# Patient Record
Sex: Male | Born: 1955
Health system: Southern US, Community
[De-identification: ages and names within clinical notes are randomized; demographics above are authoritative.]

## PROBLEM LIST (undated history)

## (undated) DIAGNOSIS — R9431 Abnormal electrocardiogram [ECG] [EKG]: Secondary | ICD-10-CM

## (undated) DIAGNOSIS — I10 Essential (primary) hypertension: Secondary | ICD-10-CM

## (undated) DIAGNOSIS — Z720 Tobacco use: Secondary | ICD-10-CM

## (undated) DIAGNOSIS — E876 Hypokalemia: Secondary | ICD-10-CM

## (undated) DIAGNOSIS — I422 Other hypertrophic cardiomyopathy: Secondary | ICD-10-CM

## (undated) DIAGNOSIS — N400 Enlarged prostate without lower urinary tract symptoms: Secondary | ICD-10-CM

## (undated) HISTORY — DX: Hypokalemia: E87.6

## (undated) HISTORY — PX: PROSTATE ABLATION: SHX6042

## (undated) HISTORY — DX: Other hypertrophic cardiomyopathy: I42.2

## (undated) HISTORY — DX: Abnormal electrocardiogram (ECG) (EKG): R94.31

## (undated) HISTORY — PX: CARDIAC CATHETERIZATION: SHX172

## (undated) HISTORY — DX: Tobacco use: Z72.0

## (undated) HISTORY — DX: Essential (primary) hypertension: I10

---

## 2002-06-27 ENCOUNTER — Ambulatory Visit (HOSPITAL_COMMUNITY): Admission: RE | Admit: 2002-06-27 | Discharge: 2002-06-27 | Payer: Self-pay | Admitting: Family Medicine

## 2002-06-27 ENCOUNTER — Encounter: Payer: Self-pay | Admitting: Family Medicine

## 2002-06-28 ENCOUNTER — Encounter: Payer: Self-pay | Admitting: Family Medicine

## 2002-07-18 ENCOUNTER — Encounter: Payer: Self-pay | Admitting: Family Medicine

## 2002-07-18 ENCOUNTER — Ambulatory Visit (HOSPITAL_COMMUNITY): Admission: RE | Admit: 2002-07-18 | Discharge: 2002-07-18 | Payer: Self-pay | Admitting: Family Medicine

## 2003-10-16 ENCOUNTER — Ambulatory Visit (HOSPITAL_COMMUNITY): Admission: RE | Admit: 2003-10-16 | Discharge: 2003-10-16 | Payer: Self-pay | Admitting: Family Medicine

## 2003-11-27 ENCOUNTER — Ambulatory Visit (HOSPITAL_COMMUNITY): Admission: RE | Admit: 2003-11-27 | Discharge: 2003-11-27 | Payer: Self-pay | Admitting: General Surgery

## 2005-08-26 ENCOUNTER — Ambulatory Visit (HOSPITAL_COMMUNITY): Admission: RE | Admit: 2005-08-26 | Discharge: 2005-08-26 | Payer: Self-pay | Admitting: Family Medicine

## 2006-10-11 ENCOUNTER — Ambulatory Visit: Payer: Self-pay | Admitting: Cardiology

## 2007-06-13 ENCOUNTER — Ambulatory Visit: Payer: Self-pay | Admitting: Cardiology

## 2007-06-21 ENCOUNTER — Ambulatory Visit: Payer: Self-pay | Admitting: Cardiology

## 2007-06-21 ENCOUNTER — Inpatient Hospital Stay (HOSPITAL_BASED_OUTPATIENT_CLINIC_OR_DEPARTMENT_OTHER): Admission: RE | Admit: 2007-06-21 | Discharge: 2007-06-21 | Payer: Self-pay | Admitting: Cardiology

## 2008-09-24 ENCOUNTER — Ambulatory Visit: Payer: Self-pay | Admitting: Cardiology

## 2008-10-17 ENCOUNTER — Ambulatory Visit: Payer: Self-pay

## 2008-10-17 ENCOUNTER — Encounter: Payer: Self-pay | Admitting: Cardiology

## 2008-12-12 DIAGNOSIS — I10 Essential (primary) hypertension: Secondary | ICD-10-CM | POA: Insufficient documentation

## 2008-12-12 DIAGNOSIS — R079 Chest pain, unspecified: Secondary | ICD-10-CM | POA: Insufficient documentation

## 2008-12-12 DIAGNOSIS — R9431 Abnormal electrocardiogram [ECG] [EKG]: Secondary | ICD-10-CM | POA: Insufficient documentation

## 2009-04-20 ENCOUNTER — Telehealth: Payer: Self-pay | Admitting: Cardiology

## 2009-06-28 ENCOUNTER — Emergency Department (HOSPITAL_COMMUNITY): Admission: EM | Admit: 2009-06-28 | Discharge: 2009-06-28 | Payer: Self-pay | Admitting: Emergency Medicine

## 2009-06-30 ENCOUNTER — Encounter (INDEPENDENT_AMBULATORY_CARE_PROVIDER_SITE_OTHER): Payer: Self-pay | Admitting: *Deleted

## 2011-02-05 LAB — PROTIME-INR
INR: 1 (ref 0.00–1.49)
Prothrombin Time: 13.3 seconds (ref 11.6–15.2)

## 2011-03-15 NOTE — Cardiovascular Report (Signed)
NAMEBROXTON, BROADY                  ACCOUNT NO.:  [id number]   MEDICAL RECORD NO.:  [id number]          PATIENT TYPE:  OIB   LOCATION:  1961                         FACILITY:  [facility]   PHYSICIAN:  Rollene Rotunda, MD, FACCDATE OF BIRTH:  1956-07-23   DATE OF PROCEDURE:  06/21/2007  DATE OF DISCHARGE:  06/21/2007                            CARDIAC CATHETERIZATION   PROCEDURE:  Cardiac catheterization.   INDICATIONS:  Evaluate patient with chest discomfort, an abnormal EKG  suggesting old anteroseptal infarct with possible ischemia and a  previous Cardiolite suggesting an old anterior infarct.   PROCEDURE NOTE:  Left heart catheterization is performed via the right  femoral artery. The artery was cannulated using anterior wall puncture.  A #4-French arterial sheath was inserted via the modified Seldinger  technique.  A preformed Judkins pigtail was catheter utilized.  The  patient tolerated the procedure well and left the lab in stable  condition.   RESULTS:  HEMODYNAMICS:  LV 101/8, AO 118/74.   CORONARIES:  The left main was normal.  The LAD was normal.  It was a  large vessel wrapping the apex.  The first diagonal was moderate size  and normal.  The second diagonal was small and normal.  The circumflex  in the AV groove was normal.  There was a ramus intermediate which was  moderate and normal.  There was a mid obtuse marginal which was large  and normal.  There was a posterolateral which was small and normal.  The  right coronary artery is the dominant vessel.  It was large and normal.  The PDA was moderate size normal.   LEFT VENTRICULOGRAM:  The left ventriculogram demonstrated an EF of 65%.  However, there was some apical akinesis.  There is a question of whether  this was apical hypertrophy early versus small apical akinetic  area/aneurysm related to previous infarct.   CONCLUSION:  Normal course with well preserved total ejection fraction  but apical wall motion  abnormality is described.   PLAN:  The patient will continue to have medical management for  nonanginal chest pain.  I will follow up with an echocardiogram in the  months to come.      Rollene Rotunda, MD, [facility]  Electronically Signed     JH/MEDQ  D:  06/21/2007  T:  06/22/2007  Job:  [id number]   cc:   Alfredia Client, MD

## 2011-03-15 NOTE — Assessment & Plan Note (Signed)
[facility]                            CARDIOLOGY OFFICE NOTE   NAME:[name] Foster, [name] Foster                           MRN:          [id number]  DATE:06/13/2007                            DOB:          04/12/1956    PRIMARY CARE PHYSICIAN:  Dr. Molly Maduro Day.   REASON FOR PRESENTATION:  Evaluate patient with chest discomfort and an  abnormal stress perfusion study.   HISTORY OF PRESENT ILLNESS:  The patient is now 55 years old.  I saw him  in December.  He was referred for an abnormal EKG.  I was able to review  a 2003 stress perfusion study which had demonstrated an anterior septal  defect consistent with a scar.  The patient was not aware of this.  It  was mostly fixed by the description.  At that time he was having some  shortness of breath.  I suggested cardiac catheterization, but he did  not want to do this, wanted to think about it.  Since that time he has  had, several weeks ago, an episode of substernal chest discomfort that  he thought might be indigestion.  However, it was accompanied by  shortness of breath and lightheadedness.  It lasted for a few hours.  It  went away spontaneously.  He had not been having this before.  He did  not have radiation to his jaw or to his arms.  He was concerned about  it, and now presents for further evaluation.  He is still getting  dyspneic with exertion.  He is not having any resting shortness of  breath, denies any PND or orthopnea.  He has had no recent chest  discomfort.  He, unfortunately, is still smoking cigarettes, though he  apparently has been taking Chantix.   PAST MEDICAL HISTORY:  1. Tobacco abuse.  2. Borderline hypertension.   PAST SURGICAL HISTORY:  None.   ALLERGIES:  None.   MEDICATIONS:  1. Toprol 25 mg b.i.d.  2. Aspirin 81 mg daily.  3. Apparently Chantix.   SOCIAL HISTORY:  The patient is a Government social research officer.  He is divorced.  He  has been smoking one pack a day for 28 years.   FAMILY  HISTORY:  Noncontributory for early coronary disease.   REVIEW OF SYSTEMS:  Negative for other systems.   PHYSICAL EXAMINATION:  The patient is in no distress.  Blood pressure 120/88, heart rate 69 and regular, weight 182 pounds,  body mass index 25.  HEENT:  Eyelids unremarkable, pupils are equal, round, and reactive to  light, fundi not visualized.  Oral mucosa unremarkable.  NECK:  No jugular venous distension at 45 degrees, carotid upstroke  brisk and symmetrical.  No bruit.  No thyromegaly.  LYMPHATICS:  No cervical, axillary, inguinal adenopathy.  LUNGS:  Clear to auscultation bilaterally.  BACK:  No costovertebral angle tenderness.  CHEST:  Unremarkable.  HEART:  PMI not displaced or sustained.  S1 and S2 are within normal  limits.  No S3, no S4.  No clicks, no rubs, no murmurs.  ABDOMEN:  Mildly obese, positive  bowel sounds, normal in frequency and  pitch.  No bruits, no rebound, no guarding.  No midline pulsatile mass.  No hepatomegaly, no splenomegaly.  SKIN:  No rashes, no nodules.  EXTREMITIES:  2+ pulses throughout, no edema.  No cyanosis, no clubbing.  NEURO:  Oriented to person, place, and time.  Cranial nerves II-XII  grossly intact, motor grossly intact.   EKG sinus rhythm, old anterior septal infarct, left axis deviation,  anterolateral T-wave inversions, unchanged from previous, inferior T-  wave inversions, unchanged from previous.   ASSESSMENT AND PLAN:  1. Chest:  The patient has some chest discomfort and has some ongoing      dyspnea with apparent previous anterior infarct on stress perfusion      study.  Given this, the next step should be a cardiac      catheterization.  He will continue with his aspirin an his beta      blocker.  He is instructed not to smoke cigarettes.  He is      instructed to come to the emergency room if he has any recurrent      chest discomfort or any acute shortness of breath.  Otherwise, will      plan an elective cardiac  catheterization.  2. Tobacco:  As above.  He was counseled on the need to stop smoking.  3. Hypertension:  This is well controlled.  4. Dyslipidemia:  He had a recent lipid profile with an LDL of 70.      His HDL was mildly low.  I will consider treating this with a      statin based on the catheterization.  5. Followup will be at the time of the catheterization.     Rollene Rotunda, MD, [facility]  Electronically Signed    JH/MedQ  DD: 06/13/2007  DT: 06/14/2007  Job #: [id number]   cc:   Alfredia Client, MD

## 2011-03-15 NOTE — Assessment & Plan Note (Signed)
[facility]                            CARDIOLOGY OFFICE NOTE   NAME:Foster, [name]                           MRN:          [id number]  DATE:09/24/2008                            DOB:          05-23-1956    PRIMARY CARE PHYSICIAN:  [name] Foster.   REASON FOR PRESENTATION:  Evaluate the patient with apical hypertrophy.   HISTORY OF PRESENT ILLNESS:  The patient is a pleasant 54 year old  gentleman, who had chest discomfort and an abnormal EKG as described.  I  saw him last year and performed a cardiac catheterization.  This  demonstrated normal coronaries.  However, his EF was 65%.  He had some  apical akinesis with questionable apical hypertrophy.   He presents for followup.  He continues to get the same chest  discomfort.  This is a gripping discomfort that happens sporadically.  He cannot associate it with food.  He cannot bring it on with activity.  It comes out of the blue.  He stops that he is doing and it goes away.  This is the same discomfort with the same pattern that he had at the  time of his catheterization.  He has had no associated nausea, vomiting,  or diaphoresis.  He has had no palpitation, presyncope, or syncope.  He  has had no PND or orthopnea.  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  He is unfortunately still smoking a few cigarettes,  though many fewer than he has previously.   PAST MEDICAL HISTORY:  Apical hypertrophy with apical akinesis, ongoing  tobacco abuse, and hypertension.   ALLERGIES:  None.   MEDICATIONS:  1. Toprol 25 mg b.i.d.  2. Hydrochlorothiazide 25 mg daily.   REVIEW OF SYSTEMS:  As stated in the HPI and otherwise negative for  other systems.   PHYSICAL EXAMINATION:  GENERAL:  The patient is pleasant and in no  distress.  VITAL SIGNS:  Blood pressure 108/76, heart rate is 69 and regular,  weight 174 pounds, and body mass index 24.  HEENT:  Eyes are unremarkable, pupils are  equal, round, and reactive to  light, fundi not visualized, oral mucosa unremarkable.  NECK:  No jugular venous distention at 45 degrees, carotid upstroke  brisk and symmetric, no bruits, no thyromegaly.  LYMPHATICS:  No cervical, axillary, or inguinal adenopathy.  LUNGS:  Clear to auscultation bilaterally.  BACK:  No costovertebral angle mass.  CHEST:  Unremarkable.  HEART:  PMI not displaced or sustained, S1 and S2 within normal limits,  no S3, no S4, no clicks, no rubs, no murmurs.  ABDOMEN:  Flat, positive  bowel sounds.  Normal in frequency and pitch, no bruits, no rebound, no  guarding, no midline pulsatile mass, no hepatomegaly, no splenomegaly.  SKIN:  No rashes, no nodules.  EXTREMITIES:  Pulses are 2+ throughout, no edema, no cyanosis, no  clubbing.  NEURO:  Oriented to person, place, and time, cranial nerves II-XII are  grossly intact, motor grossly intact.   EKG; sinus rhythm, rate 69, axis within normal limits, intervals within  normal limits, poor anterior R-wave progression, inferolateral T-wave  inversions unchanged from previous.   ASSESSMENT AND PLAN:  1. Abnormal electrocardiogram.  The patient has questionable left      ventricular hypertrophy with an apical distribution.  He has had a      regional wall motion abnormality as well.  The plan was to follow      this up with a repeat echocardiogram to see that there has been no      change.  I will go ahead and schedule this at his convenience.  2. Tobacco.  We talked about the need to stop smoking altogether.  He      has cut back substantially, but needs to abstain.  3. Hypertension.  Blood pressure is well controlled and he will      continue with the meds as listed.  4. Risk reduction.  The patient has his lipids followed by [name] Foster.  No further therapy is warranted.  5. Followup.  I would like to see him yearly or sooner based on the      results of the above echo.     Rollene Rotunda, MD,  [facility]  Electronically Signed    JH/MedQ  DD: 09/24/2008  DT: 09/25/2008  Job #: [id number]   cc:   [name] Pierini, NP

## 2011-03-18 NOTE — Assessment & Plan Note (Signed)
[facility]ALTH[facility]ARE                            CARDIOLOGY OFFICE NOTE   NAME:Foster, [name]                           MRN:          [id number]  DATE:10/11/2006                            DOB:          Oct 21, 1956    PRIMARY CARE PHYSICIAN:  Dr. Molly Maduro Day.   REASON FOR PRESENTATION:  A patient with an abnormal EKG.   HISTORY OF PRESENT ILLNESS:  The patient is a pleasant, 55 year old  gentleman with prior history of apparent abnormal EKG. It turns out he  had a stress-perfusion study done in 2003 but he did not know the  results. I was able to look these up in the computer and find out that  he had an EF of 56% and a moderate anteroseptal defect consistent with  old scar. They describe it as mostly fixed.  He saw Dr. Morrie Sheldon recently  as a new patient and was noted to have an abnormal EKG consistent with  an old anteroseptal myocardial infarction.   The patient describes some shortness of breath. This happens with  activity such as mowing the lawn or moving briskly. He thinks this had  been relatively stable over the past many months. He does get some chest  pressure. This may happen on a daily basis. It happens when he works  hard or has some emotional stress. He describes it as mild to moderate  at most. There is no associated nausea, vomiting or diaphoresis. There  is no discomfort into his jaw or to his arms. He has no palpitations or  presyncope or syncope. He has no PND or orthopnea. He has never had any  follow-up of this abnormal stress test.   PAST MEDICAL HISTORY:  1. Borderline hypertension.  2. Tobacco abuse.   PAST SURGICAL HISTORY:  None.   ALLERGIES:  None.   MEDICATIONS:  The patient was recently given a prescription for Lamisil  and Chantix. He has not started these yet.   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 He has  no children. He smokes one pack of cigarettes a day for 28 years.   FAMILY HISTORY:   Noncontributory for early coronary artery disease. Does  have some joint pains.   REVIEW OF SYSTEMS:  As stated in the HPI. Otherwise negative for other  systems.   PHYSICAL EXAMINATION:  GENERAL: The patient is in no distress.  VITAL SIGNS: Blood pressure 136/70, heart rate 89 and regular, weight  180 pounds. Body mass index 25.  HEENT: Eyes unremarkable, pupils equal, round and react to light. Fundi  not visualized. Oral mucosa unremarkable.  NECK: No jugular venous distension, wave form within normal limits,  carotid upstroke brisk and symmetric, no bruits, no thyromegaly.  LYMPHATICS: No cervical, axillary or inguinal adenopathy.  LUNGS: Clear to auscultation bilaterally.  BACK: No costovertebral angle tenderness.  CHEST: Unremarkable.  HEART:  The PMI is not displaced or sustained, S1 and S2 within normal  limits, no S3, no S4, no murmurs.  ABDOMEN: Flat, positive bowel sounds, normal in frequency of pitch, no  bruits, no rebound, no guarding, no midline pulse, no mass or  hepatomegaly, no splenomegaly.  SKIN: No rashes. No nodules.  EXTREMITIES: Pulses 2+ throughout. No edema, no cyanosis, no clubbing.  NEURO: Oriented to person, place and time, cranial nerves II-XII grossly  intact, motor grossly intact throughout.   EKG: Sinus rhythm, rate 89, left axis deviation, left anterior  fascicular block, poor anterior R wave progression, old anteroseptal  infarct, anterolateral  T wave inversions unchanged from EKG in Dr. Nelly Laurence office and no other  old EKGs for comparison.   ASSESSMENT AND PLAN:  ABNORMAL EKG:Marland Kitchen The patient almost definitely had  an old anterior myocardial infarction. He does have some symptoms of  chest discomfort. I have suggested to him a cardiac catheterization and  he wants to think about this. In the meantime he will continue aspirin  81 mg a day. He will also be started on Metoprolol 25 mg b.i.d. We are  going to need to address risk reduction even more  aggressively. He  understands now the extreme importance of quitting the cigarettes and  will start the Chantix. He will call me back hopefully soon to decide on  a cardiac catheterization.   RISK REDUCTION:  I have looked at his lipids. He has an LDL of 70 and an  HDL in the 30s. It is hard to justify a Statin in this situation. He  probably needs a Lipo-Medi profile to further quantify the particle  size.   FOLLOWUP:  I would like to follow him up in no less than 6 months but  will try to talk to him on the phone about further testing.     Rollene Rotunda, MD, [facility]  Electronically Signed    JH/MedQ  DD: 10/11/2006  DT: 10/11/2006  Job #: [id number]   cc:   Alfredia Client, MD

## 2011-04-29 ENCOUNTER — Other Ambulatory Visit: Payer: Self-pay | Admitting: Cardiology

## 2011-06-04 ENCOUNTER — Other Ambulatory Visit: Payer: Self-pay | Admitting: Cardiology

## 2011-06-07 ENCOUNTER — Encounter: Payer: Self-pay | Admitting: Cardiology

## 2012-03-15 ENCOUNTER — Emergency Department (HOSPITAL_COMMUNITY): Payer: BC Managed Care – PPO

## 2012-03-15 ENCOUNTER — Emergency Department (HOSPITAL_COMMUNITY)
Admission: EM | Admit: 2012-03-15 | Discharge: 2012-03-15 | Disposition: A | Discharge status: Home / Self Care | Payer: BC Managed Care – PPO | Attending: Emergency Medicine | Admitting: Emergency Medicine

## 2012-03-15 ENCOUNTER — Other Ambulatory Visit: Payer: Self-pay

## 2012-03-15 ENCOUNTER — Encounter (HOSPITAL_COMMUNITY): Payer: Self-pay | Admitting: *Deleted

## 2012-03-15 DIAGNOSIS — R110 Nausea: Secondary | ICD-10-CM | POA: Insufficient documentation

## 2012-03-15 DIAGNOSIS — Z79899 Other long term (current) drug therapy: Secondary | ICD-10-CM | POA: Insufficient documentation

## 2012-03-15 DIAGNOSIS — M549 Dorsalgia, unspecified: Secondary | ICD-10-CM | POA: Insufficient documentation

## 2012-03-15 DIAGNOSIS — R109 Unspecified abdominal pain: Secondary | ICD-10-CM | POA: Insufficient documentation

## 2012-03-15 DIAGNOSIS — N23 Unspecified renal colic: Secondary | ICD-10-CM | POA: Insufficient documentation

## 2012-03-15 DIAGNOSIS — I10 Essential (primary) hypertension: Secondary | ICD-10-CM | POA: Insufficient documentation

## 2012-03-15 DIAGNOSIS — E876 Hypokalemia: Secondary | ICD-10-CM

## 2012-03-15 LAB — CBC
HCT: 43.5 % (ref 39.0–52.0)
Hemoglobin: 14.7 g/dL (ref 13.0–17.0)
MCH: 26.3 pg (ref 26.0–34.0)
MCHC: 33.8 g/dL (ref 30.0–36.0)
MCV: 77.7 fL — ABNORMAL LOW (ref 78.0–100.0)
Platelets: 197 10*3/uL (ref 150–400)
RBC: 5.6 MIL/uL (ref 4.22–5.81)
RDW: 14.5 % (ref 11.5–15.5)
WBC: 12.5 10*3/uL — ABNORMAL HIGH (ref 4.0–10.5)

## 2012-03-15 LAB — COMPREHENSIVE METABOLIC PANEL
ALT: 18 U/L (ref 0–53)
AST: 14 U/L (ref 0–37)
Albumin: 4.2 g/dL (ref 3.5–5.2)
Alkaline Phosphatase: 87 U/L (ref 39–117)
BUN: 18 mg/dL (ref 6–23)
CO2: 26 mEq/L (ref 19–32)
Calcium: 9.4 mg/dL (ref 8.4–10.5)
Chloride: 102 mEq/L (ref 96–112)
Creatinine, Ser: 0.9 mg/dL (ref 0.50–1.35)
GFR calc Af Amer: >90 mL/min (ref >90)
GFR calc non Af Amer: >90 mL/min (ref >90)
Glucose, Bld: 142 mg/dL — ABNORMAL HIGH (ref 70–99)
Potassium: 2.9 mEq/L — ABNORMAL LOW (ref 3.5–5.1)
Sodium: 139 mEq/L (ref 135–145)
Total Bilirubin: 0.3 mg/dL (ref 0.3–1.2)
Total Protein: 7.5 g/dL (ref 6.0–8.3)

## 2012-03-15 LAB — URINALYSIS, ROUTINE W REFLEX MICROSCOPIC
Bilirubin Urine: NEGATIVE
Glucose, UA: NEGATIVE mg/dL
Ketones, ur: NEGATIVE mg/dL
Leukocytes, UA: NEGATIVE
Nitrite: NEGATIVE
Protein, ur: NEGATIVE mg/dL
Specific Gravity, Urine: >1.03 — ABNORMAL HIGH (ref 1.005–1.030)
Urobilinogen, UA: 0.2 mg/dL (ref 0.0–1.0)
pH: 5.5 (ref 5.0–8.0)

## 2012-03-15 LAB — LIPASE, BLOOD: Lipase: 24 U/L (ref 11–59)

## 2012-03-15 LAB — URINE MICROSCOPIC-ADD ON

## 2012-03-15 MED ORDER — ONDANSETRON HCL 4 MG/2ML IJ SOLN
4.0000 mg | Freq: Once | INTRAMUSCULAR | Status: AC
  Administered 2012-03-15: 4 mg via INTRAVENOUS
  Filled 2012-03-15: qty 2

## 2012-03-15 MED ORDER — OXYCODONE-ACETAMINOPHEN 5-325 MG PO TABS: 1.0000 | ORAL_TABLET | ORAL | Status: AC | PRN

## 2012-03-15 MED ORDER — POTASSIUM CHLORIDE 20 MEQ/15ML (10%) PO LIQD
40.0000 meq | Freq: Once | ORAL | Status: AC
  Administered 2012-03-15: 40 meq via ORAL
  Filled 2012-03-15: qty 30

## 2012-03-15 MED ORDER — HYDROMORPHONE HCL PF 1 MG/ML IJ SOLN
1.0000 mg | Freq: Once | INTRAMUSCULAR | Status: AC
  Administered 2012-03-15: 1 mg via INTRAVENOUS
  Filled 2012-03-15: qty 1

## 2012-03-15 NOTE — Discharge Instructions (Signed)

## 2012-03-15 NOTE — ED Notes (Signed)
Sharp, constant pain to RUQ. Nausea. NAD

## 2012-03-15 NOTE — ED Provider Notes (Signed)
History     CSN: [id number]  Arrival date & time 03/15/12  1322   First MD Initiated Contact with Patient 03/15/12 1410      Chief Complaint  Patient presents with  . Abdominal Pain    Patient is a 56 y.o. male presenting with abdominal pain. The history is provided by the patient.  Abdominal Pain The primary symptoms of the illness include abdominal pain and nausea. The primary symptoms of the illness do not include shortness of breath, vomiting or diarrhea. The current episode started 3 to 5 hours ago. The onset of the illness was sudden. The problem has been gradually worsening.  Additional symptoms associated with the illness include back pain. Symptoms associated with the illness do not include chills.  pt reports onset of RUQ and right flank/pain earlier today He reports it would at times radiates to groin, but none at this time Denies cp/sob Denies focal leg weakness He has never had this before It is not associated with exertion Past Medical History  Diagnosis Date  . Chest pain, unspecified   . Benign essential hypertension   . Abnormal EKG   . Tobacco abuse   . Apical variant hypertrophic cardiomyopathy     Past Surgical History  Procedure Date  . Cardiac catheterization     2008    Family History  Problem Relation Age of Onset  . Diabetes Other     History  Substance Use Topics  . Smoking status: Smoker, Current Status Unknown  . Smokeless tobacco: Not on file  . Alcohol Use: No      Review of Systems  Constitutional: Negative for chills.  Respiratory: Negative for shortness of breath.   Gastrointestinal: Positive for nausea and abdominal pain. Negative for vomiting and diarrhea.  Musculoskeletal: Positive for back pain.  All other systems reviewed and are negative.    Allergies  Review of patient's allergies indicates no known allergies.  Home Medications   Current Outpatient Rx  Name Route Sig Dispense Refill  . METOPROLOL TARTRATE 50  MG PO TABS  TAKE ONE-HALF (1/2) TABLET TWICE DAILY. 30 tablet 12    BP 144/88  Pulse 83  Temp(Src) 97.6 F (36.4 C) (Oral)  Resp 18  Ht 5\' 10"  (1.778 m)  Wt 175 lb (79.379 kg)  BMI 25.11 kg/m2  SpO2 100%  Physical Exam CONSTITUTIONAL: Well developed/well nourished HEAD AND FACE: Normocephalic/atraumatic EYES: EOMI/PERRL ENMT: Mucous membranes moist NECK: supple no meningeal signs SPINE:entire spine nontender CV: S1/S2 noted, no murmurs/rubs/gallops noted LUNGS: Lungs are clear to auscultation bilaterally, no apparent distress ABDOMEN: soft, nontender, no rebound or guarding GU:he has tenderness to palpation of right flank, no bruising NEURO: Pt is awake/alert, moves all extremitiesx4 EXTREMITIES: pulses normal, full ROM SKIN: warm, color normal PSYCH: no abnormalities of mood noted  ED Course  Procedures   Labs Reviewed  CBC - Abnormal; Notable for the following:    WBC 12.5 (*)    MCV 77.7 (*)    All other components within normal limits  COMPREHENSIVE METABOLIC PANEL - Abnormal; Notable for the following:    Potassium 2.9 (*)    Glucose, Bld 142 (*)    All other components within normal limits  URINALYSIS, ROUTINE W REFLEX MICROSCOPIC - Abnormal; Notable for the following:    Specific Gravity, Urine >1.030 (*)    Hgb urine dipstick MODERATE (*)    All other components within normal limits  LIPASE, BLOOD  URINE MICROSCOPIC-ADD ON   Pt with some  improvement of pain with meds, given hematuria, suspected ureteral colic, he elected to have CT imaging Pt improved and would like to go home He will f/u with his PCP for his hypoK (reports h/o hypoK previously and on K supplementation)  The patient appears reasonably screened and/or stabilized for discharge and I doubt any other medical condition or other [facility] requiring further screening, evaluation, or treatment in the ED at this time prior to discharge.    MDM  Nursing notes reviewed and considered in  documentation All labs/vitals reviewed and considered        Date: 03/15/2012  Rate: 83  Rhythm: normal sinus rhythm  QRS Axis: normal  Intervals: normal  ST/T Wave abnormalities: nonspecific ST changes  Conduction Disutrbances:none     Joya Gaskins, MD 03/15/12 1559

## 2012-09-14 ENCOUNTER — Encounter (INDEPENDENT_AMBULATORY_CARE_PROVIDER_SITE_OTHER): Payer: Self-pay | Admitting: *Deleted

## 2013-06-07 ENCOUNTER — Telehealth: Payer: Self-pay | Admitting: Nurse Practitioner

## 2013-06-10 ENCOUNTER — Other Ambulatory Visit: Payer: Self-pay | Admitting: *Deleted

## 2013-06-10 MED ORDER — SILDENAFIL CITRATE 100 MG PO TABS: ORAL_TABLET | ORAL | Status: DC

## 2013-06-10 NOTE — Telephone Encounter (Signed)
LAST OV 08/29/12. NTBS. MED NOT LISTED IN EPIC BUT LISTED IN PAPER CHART. PT HAS HTN

## 2014-01-05 ENCOUNTER — Encounter (HOSPITAL_COMMUNITY): Payer: Self-pay | Admitting: Emergency Medicine

## 2014-01-05 ENCOUNTER — Emergency Department (HOSPITAL_COMMUNITY)
Admission: EM | Admit: 2014-01-05 | Discharge: 2014-01-05 | Disposition: A | Discharge status: Home / Self Care | Payer: BC Managed Care – PPO | Attending: Emergency Medicine | Admitting: Emergency Medicine

## 2014-01-05 DIAGNOSIS — S46909A Unspecified injury of unspecified muscle, fascia and tendon at shoulder and upper arm level, unspecified arm, initial encounter: Secondary | ICD-10-CM | POA: Insufficient documentation

## 2014-01-05 DIAGNOSIS — Z79899 Other long term (current) drug therapy: Secondary | ICD-10-CM | POA: Insufficient documentation

## 2014-01-05 DIAGNOSIS — IMO0002 Reserved for concepts with insufficient information to code with codable children: Secondary | ICD-10-CM | POA: Insufficient documentation

## 2014-01-05 DIAGNOSIS — F17200 Nicotine dependence, unspecified, uncomplicated: Secondary | ICD-10-CM | POA: Insufficient documentation

## 2014-01-05 DIAGNOSIS — Y92410 Unspecified street and highway as the place of occurrence of the external cause: Secondary | ICD-10-CM | POA: Insufficient documentation

## 2014-01-05 DIAGNOSIS — Y9389 Activity, other specified: Secondary | ICD-10-CM | POA: Insufficient documentation

## 2014-01-05 DIAGNOSIS — I10 Essential (primary) hypertension: Secondary | ICD-10-CM | POA: Insufficient documentation

## 2014-01-05 DIAGNOSIS — T148XXA Other injury of unspecified body region, initial encounter: Secondary | ICD-10-CM

## 2014-01-05 DIAGNOSIS — S4980XA Other specified injuries of shoulder and upper arm, unspecified arm, initial encounter: Secondary | ICD-10-CM | POA: Insufficient documentation

## 2014-01-05 DIAGNOSIS — Z9889 Other specified postprocedural states: Secondary | ICD-10-CM | POA: Insufficient documentation

## 2014-01-05 MED ORDER — IBUPROFEN 600 MG PO TABS: 600.0000 mg | ORAL_TABLET | Freq: Four times a day (QID) | ORAL | Status: DC | PRN

## 2014-01-05 MED ORDER — DIAZEPAM 5 MG PO TABS
5.0000 mg | ORAL_TABLET | Freq: Once | ORAL | Status: AC
  Administered 2014-01-05: 5 mg via ORAL
  Filled 2014-01-05: qty 1

## 2014-01-05 MED ORDER — DIAZEPAM 2 MG PO TABS: 2.0000 mg | ORAL_TABLET | ORAL | Status: DC | PRN

## 2014-01-05 MED ORDER — KETOROLAC TROMETHAMINE 60 MG/2ML IM SOLN
60.0000 mg | Freq: Once | INTRAMUSCULAR | Status: AC
  Administered 2014-01-05: 60 mg via INTRAMUSCULAR
  Filled 2014-01-05: qty 2
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
## 2014-01-14 ENCOUNTER — Other Ambulatory Visit (HOSPITAL_COMMUNITY): Payer: Self-pay | Admitting: Urology

## 2014-01-14 DIAGNOSIS — IMO0002 Reserved for concepts with insufficient information to code with codable children: Secondary | ICD-10-CM

## 2014-01-14 DIAGNOSIS — R229 Localized swelling, mass and lump, unspecified: Principal | ICD-10-CM

## 2014-01-17 ENCOUNTER — Ambulatory Visit (HOSPITAL_COMMUNITY)
Admission: RE | Admit: 2014-01-17 | Discharge: 2014-01-17 | Disposition: A | Discharge status: Home / Self Care | Payer: BC Managed Care – PPO | Source: Ambulatory Visit | Attending: Urology | Admitting: Urology

## 2014-01-17 DIAGNOSIS — IMO0002 Reserved for concepts with insufficient information to code with codable children: Secondary | ICD-10-CM

## 2014-01-17 DIAGNOSIS — R9389 Abnormal findings on diagnostic imaging of other specified body structures: Secondary | ICD-10-CM | POA: Insufficient documentation

## 2014-01-17 DIAGNOSIS — N400 Enlarged prostate without lower urinary tract symptoms: Secondary | ICD-10-CM | POA: Insufficient documentation

## 2014-01-17 DIAGNOSIS — R229 Localized swelling, mass and lump, unspecified: Secondary | ICD-10-CM

## 2014-01-17 DIAGNOSIS — N289 Disorder of kidney and ureter, unspecified: Secondary | ICD-10-CM | POA: Insufficient documentation

## 2014-01-29 DIAGNOSIS — Z87442 Personal history of urinary calculi: Secondary | ICD-10-CM | POA: Insufficient documentation

## 2014-01-29 HISTORY — PX: KIDNEY SURGERY: SHX687

## 2014-02-24 ENCOUNTER — Ambulatory Visit (HOSPITAL_COMMUNITY): Payer: BC Managed Care – PPO | Attending: Internal Medicine | Admitting: Cardiology

## 2014-02-24 ENCOUNTER — Encounter: Payer: Self-pay | Admitting: Cardiology

## 2014-02-24 ENCOUNTER — Ambulatory Visit (INDEPENDENT_AMBULATORY_CARE_PROVIDER_SITE_OTHER): Payer: BC Managed Care – PPO | Admitting: Cardiology

## 2014-02-24 ENCOUNTER — Encounter: Payer: Self-pay | Admitting: Internal Medicine

## 2014-02-24 VITALS — BP 124/70 | HR 90 | Ht 70.0 in | Wt 166.8 lb

## 2014-02-24 DIAGNOSIS — R9431 Abnormal electrocardiogram [ECG] [EKG]: Secondary | ICD-10-CM

## 2014-02-24 DIAGNOSIS — R079 Chest pain, unspecified: Secondary | ICD-10-CM

## 2014-02-24 DIAGNOSIS — Z01810 Encounter for preprocedural cardiovascular examination: Secondary | ICD-10-CM

## 2014-02-24 DIAGNOSIS — I428 Other cardiomyopathies: Secondary | ICD-10-CM

## 2014-02-24 DIAGNOSIS — F17200 Nicotine dependence, unspecified, uncomplicated: Secondary | ICD-10-CM

## 2014-02-24 DIAGNOSIS — I422 Other hypertrophic cardiomyopathy: Secondary | ICD-10-CM

## 2014-02-24 DIAGNOSIS — I10 Essential (primary) hypertension: Secondary | ICD-10-CM

## 2014-02-24 DIAGNOSIS — Z720 Tobacco use: Secondary | ICD-10-CM | POA: Insufficient documentation

## 2014-02-24 MED ORDER — METOPROLOL TARTRATE 25 MG PO TABS: 25.0000 mg | ORAL_TABLET | Freq: Two times a day (BID) | ORAL | Status: DC

## 2014-02-24 NOTE — Patient Instructions (Signed)
Your physician wants you to follow-up in: [place] will receive a reminder letter in the mail two months in advance. If you don't receive a letter, please call our office to schedule the follow-up appointment.   Your physician has requested that you have an echocardiogram. Echocardiography is a painless test that uses sound waves to create images of your heart. It provides your doctor with information about the size and shape of your heart and how well your heart's chambers and valves are working. This procedure takes approximately one hour. There are no restrictions for this procedure.   START METOPROLOL 25 MG ONE TABLET TWICE DAILY

## 2014-02-24 NOTE — Assessment & Plan Note (Signed)
Patient presents for preoperative evaluation prior to nephrectomy. Previous catheterization showed normal LV function with apical wall motion abnormality. Coronaries were normal. He has excellent functional capacity with no chest pain. I do not think further ischemia evaluation is necessary preoperatively. I will arrange an echocardiogram to reassess LV function and question of apical hypertrophic cardiomyopathy.

## 2014-02-24 NOTE — Progress Notes (Signed)
     HPI: 58 year old male for preoperative evaluation prior to nephrecotmy. Patient previously seen by Dr. Percival Spanish but not since 2009. Patient had a cardiac catheterization in August of 2008 because of chest pain and an abnormal electrocardiogram. Coronaries were normal and his ejection fraction was 65%. There was note of apical akinesis. Echocardiogram in 2009 showed normal LV function and aneurysmal deformity the apical wall. Patient has dyspnea with more extreme activities but not routine activities. No orthopnea, PND, pedal edema, palpitations, syncope or exertional chest pain. Good functional capacity. No claudication.  Current Outpatient Prescriptions  Medication Sig Dispense Refill  . Cholecalciferol (VITAMIN D) 400 UNITS capsule Take 1 tablet by mouth.      . Cyanocobalamin (VITAMIN B12 PO) Take 50 mcg by mouth.      . Multiple Vitamins-Minerals (MULTIVITAMIN PO) Take 1 tablet by mouth daily.      . Omega-3 1000 MG CAPS Take 1 g by mouth.      . pseudoephedrine (SUDAFED) 30 MG tablet Take 30 mg by mouth.       No current facility-administered medications for this visit.    No Known Allergies  Past Medical History  Diagnosis Date  . Benign essential hypertension   . Abnormal EKG   . Tobacco abuse   . Apical variant hypertrophic cardiomyopathy     Past Surgical History  Procedure Laterality Date  . Cardiac catheterization      2008    History   Social History  . Marital Status: Married    Spouse Name: N/A    Number of Children: 1  . Years of Education: N/A   Occupational History  . Full time Teacher, early years/pre   .     Social History Main Topics  . Smoking status: Smoker, Current Status Unknown    Types: Cigarettes  . Smokeless tobacco: Not on file  . Alcohol Use: No  . Drug Use: No  . Sexual Activity: Not on file   Other Topics Concern  . Not on file   Social History Narrative   Divorced    Family History  Problem Relation Age of Onset  . Diabetes Other      ROS: Some back pain but no fevers or chills, productive cough, hemoptysis, dysphasia, odynophagia, melena, hematochezia, dysuria, hematuria, rash, seizure activity, orthopnea, PND, pedal edema, claudication. Remaining systems are negative.  Physical Exam:   Blood pressure 124/70, pulse 90, height 5\' 10"  (1.778 m), weight 166 lb 12.8 oz (75.66 kg).  General:  Well developed/well nourished in NAD Skin warm/dry Patient not depressed No peripheral clubbing Back-normal HEENT-normal/normal eyelids Neck supple/normal carotid upstroke bilaterally; no bruits; no JVD; no thyromegaly chest - CTA/ normal expansion CV - RRR/normal S1 and S2; no murmurs, rubs or gallops;  PMI nondisplaced Abdomen -NT/ND, no HSM, no mass, + bowel sounds, no bruit 2+ femoral pulses, no bruits Ext-no edema, chords, 2+ DP Neuro-grossly nonfocal  ECG NSR, inferior lateral TWI

## 2014-02-24 NOTE — Assessment & Plan Note (Signed)
Resume metoprolol 25 mg by mouth twice a day.

## 2014-02-24 NOTE — Assessment & Plan Note (Signed)
Question apical hypertrophic cardiomyopathy previously. Repeat echocardiogram. Resume metoprolol 25 mg by mouth twice a day. No risk factors for sudden death including no family history and no history of syncope. If he indeed he does have this on his echocardiogram his siblings will need to be screened. We would also proceed with a 24-hour Holter to exclude nonsustained ventricular tachycardia. We will await echo results.

## 2014-02-24 NOTE — Progress Notes (Signed)
Echo Performed

## 2014-02-24 NOTE — Assessment & Plan Note (Signed)
Patient counseled on discontinuing. 

## 2014-05-19 ENCOUNTER — Ambulatory Visit: Payer: Self-pay | Admitting: Family Medicine

## 2014-06-04 ENCOUNTER — Encounter (INDEPENDENT_AMBULATORY_CARE_PROVIDER_SITE_OTHER): Payer: Self-pay

## 2014-06-04 ENCOUNTER — Encounter: Payer: Self-pay | Admitting: Family Medicine

## 2014-06-04 ENCOUNTER — Ambulatory Visit (INDEPENDENT_AMBULATORY_CARE_PROVIDER_SITE_OTHER): Payer: BC Managed Care – PPO | Admitting: Family Medicine

## 2014-06-04 VITALS — BP 136/90 | HR 66 | Temp 99.1°F | Ht 70.0 in | Wt 166.0 lb

## 2014-06-04 DIAGNOSIS — I422 Other hypertrophic cardiomyopathy: Secondary | ICD-10-CM

## 2014-06-04 DIAGNOSIS — I10 Essential (primary) hypertension: Secondary | ICD-10-CM

## 2014-06-04 DIAGNOSIS — I428 Other cardiomyopathies: Secondary | ICD-10-CM

## 2014-06-04 MED ORDER — SILDENAFIL CITRATE 100 MG PO TABS: 100.0000 mg | ORAL_TABLET | Freq: Every day | ORAL | Status: DC | PRN

## 2014-06-04 MED ORDER — POTASSIUM CHLORIDE CRYS ER 20 MEQ PO TBCR: 20.0000 meq | EXTENDED_RELEASE_TABLET | Freq: Every day | ORAL | Status: DC

## 2014-06-04 MED ORDER — HYDROCHLOROTHIAZIDE 25 MG PO TABS: 25.0000 mg | ORAL_TABLET | Freq: Every day | ORAL | Status: DC
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
## 2014-06-04 NOTE — Patient Instructions (Signed)

## 2014-08-07 ENCOUNTER — Telehealth: Payer: Self-pay | Admitting: Family Medicine

## 2014-08-07 NOTE — Telephone Encounter (Signed)
Patient given stool cards.  He has appointment in November.  If stool cards are positive, the colonoscopy can be scheduled earlier.

## 2014-08-08 ENCOUNTER — Other Ambulatory Visit: Payer: BC Managed Care – PPO

## 2014-08-08 DIAGNOSIS — Z1212 Encounter for screening for malignant neoplasm of rectum: Secondary | ICD-10-CM

## 2014-08-10 LAB — FECAL OCCULT BLOOD, IMMUNOCHEMICAL: Fecal Occult Bld: POSITIVE — AB

## 2014-08-13 ENCOUNTER — Telehealth: Payer: Self-pay | Admitting: Family Medicine

## 2014-08-13 NOTE — Telephone Encounter (Signed)
Message copied by Waverly Ferrari on Wed Aug 13, 2014  2:46 PM ------      Message from: Wardell Honour      Created: Wed Aug 13, 2014  1:03 PM       Need to repeat for total of 3 studies if 2 or 3 of the 3 are positive age referral to GI ------

## 2014-08-27 ENCOUNTER — Encounter: Payer: Self-pay | Admitting: Family Medicine

## 2014-08-28 ENCOUNTER — Telehealth: Payer: Self-pay | Admitting: *Deleted

## 2014-08-28 NOTE — Telephone Encounter (Signed)
Message copied by Marin Olp on Thu Aug 28, 2014 10:20 AM ------      Message from: Wardell Honour      Created: Wed Aug 13, 2014  1:03 PM       Need to repeat for total of 3 studies if 2 or 3 of the 3 are positive age referral to GI ------

## 2014-08-28 NOTE — Telephone Encounter (Signed)
Spoke with pt regarding FOBT Pt stated he had been having some shoulder pain and had taken significant amount of BC powders Stated that stool had returned to normal Has follow up with Dr Sabra Heck on Monday 11/2 Pt would like to wait and discuss with Dr at appt

## 2014-09-10 ENCOUNTER — Ambulatory Visit: Payer: BC Managed Care – PPO | Admitting: Family Medicine

## 2014-10-21 ENCOUNTER — Other Ambulatory Visit: Payer: Self-pay | Admitting: Family Medicine

## 2014-11-05 ENCOUNTER — Ambulatory Visit: Payer: BC Managed Care – PPO | Admitting: Family Medicine

## 2014-12-18 ENCOUNTER — Telehealth: Payer: Self-pay | Admitting: Family Medicine

## 2014-12-18 MED ORDER — NICOTINE 21 MG/24HR TD PT24: 21.0000 mg | MEDICATED_PATCH | Freq: Every day | TRANSDERMAL | Status: DC

## 2014-12-18 NOTE — Telephone Encounter (Signed)
Patient desires to quit smoking and requests nicotine patch to help

## 2014-12-19 ENCOUNTER — Telehealth: Payer: Self-pay | Admitting: *Deleted

## 2014-12-19 DIAGNOSIS — Z1211 Encounter for screening for malignant neoplasm of colon: Secondary | ICD-10-CM

## 2014-12-19 NOTE — Telephone Encounter (Signed)
Patient is requesting a referral for a colonoscopy for a screening in [place] please

## 2014-12-19 NOTE — Telephone Encounter (Signed)
Patient aware referral has been made  

## 2014-12-19 NOTE — Telephone Encounter (Signed)
Okay for colonoscopy referral

## 2014-12-19 NOTE — Telephone Encounter (Signed)
Patient aware that prescription has been sent to the pharmacy

## 2014-12-22 ENCOUNTER — Encounter (INDEPENDENT_AMBULATORY_CARE_PROVIDER_SITE_OTHER): Payer: Self-pay | Admitting: *Deleted

## 2014-12-25 ENCOUNTER — Other Ambulatory Visit (INDEPENDENT_AMBULATORY_CARE_PROVIDER_SITE_OTHER): Payer: Self-pay | Admitting: *Deleted

## 2014-12-25 ENCOUNTER — Encounter (INDEPENDENT_AMBULATORY_CARE_PROVIDER_SITE_OTHER): Payer: Self-pay | Admitting: *Deleted

## 2014-12-25 DIAGNOSIS — Z1211 Encounter for screening for malignant neoplasm of colon: Secondary | ICD-10-CM

## 2014-12-29 ENCOUNTER — Other Ambulatory Visit: Payer: Self-pay | Admitting: Family Medicine

## 2014-12-29 ENCOUNTER — Telehealth (INDEPENDENT_AMBULATORY_CARE_PROVIDER_SITE_OTHER): Payer: Self-pay | Admitting: *Deleted

## 2014-12-29 DIAGNOSIS — Z1211 Encounter for screening for malignant neoplasm of colon: Secondary | ICD-10-CM

## 2014-12-29 NOTE — Telephone Encounter (Signed)
Patient needs movi prep 

## 2014-12-30 MED ORDER — PEG-KCL-NACL-NASULF-NA ASC-C 100 G PO SOLR: 1.0000 | Freq: Once | ORAL | Status: DC

## 2015-01-07 ENCOUNTER — Other Ambulatory Visit: Payer: Self-pay | Admitting: Family Medicine

## 2015-01-12 ENCOUNTER — Encounter (INDEPENDENT_AMBULATORY_CARE_PROVIDER_SITE_OTHER): Payer: Self-pay | Admitting: *Deleted

## 2015-01-26 ENCOUNTER — Telehealth (INDEPENDENT_AMBULATORY_CARE_PROVIDER_SITE_OTHER): Payer: Self-pay | Admitting: *Deleted

## 2015-01-26 NOTE — Telephone Encounter (Signed)
agree

## 2015-01-26 NOTE — Telephone Encounter (Signed)
Referring MD/PCP: Alain Honey ([facility])   Procedure: tcs  Reason/Indication:  screening  Has patient had this procedure before?  Yes, around 10 yrs ago  If so, when, by whom and where?    Is there a family history of colon cancer?  no  Who?  What age when diagnosed?    Is patient diabetic?   no      Does patient have prosthetic heart valve?  no  Do you have a pacemaker?  no  Has patient ever had endocarditis? no  Has patient had joint replacement within last 12 months?  no  Does patient tend to be constipated or take laxatives? no  Is patient on Coumadin, Plavix and/or Aspirin? no  Medications: hctz 25 mg daily, metoprolol 25 mg bid, tamsulosin 0.4 mg daily, mulit vit daily, fish oil 1000 mg daily, potassium, vit b12, vit c, vit e  Allergies: nkda  Medication Adjustment: vit e 10 days  Procedure date & time: 02/26/15 at 830

## 2015-02-10 ENCOUNTER — Other Ambulatory Visit: Payer: Self-pay | Admitting: Family Medicine

## 2015-02-10 DIAGNOSIS — I10 Essential (primary) hypertension: Secondary | ICD-10-CM

## 2015-02-10 DIAGNOSIS — R9431 Abnormal electrocardiogram [ECG] [EKG]: Secondary | ICD-10-CM

## 2015-02-11 MED ORDER — HYDROCHLOROTHIAZIDE 25 MG PO TABS: 25.0000 mg | ORAL_TABLET | Freq: Every day | ORAL | Status: DC

## 2015-02-11 MED ORDER — POTASSIUM CHLORIDE CRYS ER 20 MEQ PO TBCR: 20.0000 meq | EXTENDED_RELEASE_TABLET | Freq: Every day | ORAL | Status: DC

## 2015-02-11 MED ORDER — METOPROLOL TARTRATE 25 MG PO TABS: 25.0000 mg | ORAL_TABLET | Freq: Two times a day (BID) | ORAL | Status: DC

## 2015-02-11 NOTE — Telephone Encounter (Signed)
done

## 2015-02-26 ENCOUNTER — Ambulatory Visit (HOSPITAL_COMMUNITY)
Admission: RE | Admit: 2015-02-26 | Discharge: 2015-02-26 | Disposition: A | Discharge status: Home / Self Care | Payer: BLUE CROSS/BLUE SHIELD | Source: Ambulatory Visit | Attending: Internal Medicine | Admitting: Internal Medicine

## 2015-02-26 ENCOUNTER — Encounter (HOSPITAL_COMMUNITY): Payer: Self-pay | Admitting: *Deleted

## 2015-02-26 ENCOUNTER — Encounter (HOSPITAL_COMMUNITY)
Admission: RE | Disposition: A | Discharge status: Home / Self Care | Payer: Self-pay | Source: Ambulatory Visit | Attending: Internal Medicine

## 2015-02-26 DIAGNOSIS — D120 Benign neoplasm of cecum: Secondary | ICD-10-CM | POA: Insufficient documentation

## 2015-02-26 DIAGNOSIS — Z79899 Other long term (current) drug therapy: Secondary | ICD-10-CM | POA: Diagnosis not present

## 2015-02-26 DIAGNOSIS — I10 Essential (primary) hypertension: Secondary | ICD-10-CM | POA: Diagnosis not present

## 2015-02-26 DIAGNOSIS — K5521 Angiodysplasia of colon with hemorrhage: Secondary | ICD-10-CM | POA: Insufficient documentation

## 2015-02-26 DIAGNOSIS — K5730 Diverticulosis of large intestine without perforation or abscess without bleeding: Secondary | ICD-10-CM | POA: Diagnosis not present

## 2015-02-26 DIAGNOSIS — F17210 Nicotine dependence, cigarettes, uncomplicated: Secondary | ICD-10-CM | POA: Insufficient documentation

## 2015-02-26 DIAGNOSIS — D128 Benign neoplasm of rectum: Secondary | ICD-10-CM | POA: Insufficient documentation

## 2015-02-26 DIAGNOSIS — K644 Residual hemorrhoidal skin tags: Secondary | ICD-10-CM | POA: Diagnosis not present

## 2015-02-26 DIAGNOSIS — K5520 Angiodysplasia of colon without hemorrhage: Secondary | ICD-10-CM | POA: Diagnosis not present

## 2015-02-26 DIAGNOSIS — Z1211 Encounter for screening for malignant neoplasm of colon: Secondary | ICD-10-CM | POA: Diagnosis present

## 2015-02-26 DIAGNOSIS — Z86010 Personal history of colonic polyps: Secondary | ICD-10-CM | POA: Insufficient documentation

## 2015-02-26 DIAGNOSIS — K648 Other hemorrhoids: Secondary | ICD-10-CM | POA: Diagnosis not present

## 2015-02-26 HISTORY — PX: COLONOSCOPY: SHX5424

## 2015-02-26 LAB — CBC
HEMATOCRIT: 42.4 % (ref 39.0–52.0)
Hemoglobin: 13.9 g/dL (ref 13.0–17.0)
MCH: 25.7 pg — AB (ref 26.0–34.0)
MCHC: 32.8 g/dL (ref 30.0–36.0)
MCV: 78.5 fL (ref 78.0–100.0)
Platelets: 209 10*3/uL (ref 150–400)
RBC: 5.4 MIL/uL (ref 4.22–5.81)
RDW: 14.9 % (ref 11.5–15.5)
WBC: 10.5 10*3/uL (ref 4.0–10.5)

## 2015-02-26 SURGERY — COLONOSCOPY
Anesthesia: Moderate Sedation

## 2015-02-26 MED ORDER — MIDAZOLAM HCL 5 MG/5ML IJ SOLN
INTRAMUSCULAR | Status: DC | PRN
  Administered 2015-02-26: 2 mg via INTRAVENOUS
  Administered 2015-02-26: 3 mg via INTRAVENOUS
  Administered 2015-02-26: 2 mg via INTRAVENOUS

## 2015-02-26 MED ORDER — STERILE WATER FOR IRRIGATION IR SOLN
Status: DC | PRN
  Administered 2015-02-26: 08:00:00

## 2015-02-26 MED ORDER — MEPERIDINE HCL 50 MG/ML IJ SOLN
INTRAMUSCULAR | Status: DC | PRN
  Administered 2015-02-26 (×2): 25 mg via INTRAVENOUS

## 2015-02-26 MED ORDER — MEPERIDINE HCL 50 MG/ML IJ SOLN
INTRAMUSCULAR | Status: AC
  Filled 2015-02-26: qty 1

## 2015-02-26 MED ORDER — MIDAZOLAM HCL 5 MG/5ML IJ SOLN
INTRAMUSCULAR | Status: AC
  Filled 2015-02-26: qty 10

## 2015-02-26 MED ORDER — SODIUM CHLORIDE 0.9 % IV SOLN
INTRAVENOUS | Status: DC
  Administered 2015-02-26: 08:00:00 via INTRAVENOUS

## 2015-02-26 NOTE — Discharge Instructions (Signed)
Resume usual medications and diet. No aspirin or NSAIDs for 1 week. No driving for 24 hours. Physician will call with results of blood test and biopsy..  Colonoscopy, Care After These instructions give you information on caring for yourself after your procedure. Your doctor may also give you more specific instructions. Call your doctor if you have any problems or questions after your procedure. HOME CARE  Do not drive for 24 hours.  Do not sign important papers or use machinery for 24 hours.  You may shower.  You may go back to your usual activities, but go slower for the first 24 hours.  Take rest breaks often during the first 24 hours.  Walk around or use warm packs on your belly (abdomen) if you have belly cramping or gas.  Drink enough fluids to keep your pee (urine) clear or pale yellow.  Resume your normal diet. Avoid heavy or fried foods.  Avoid drinking alcohol for 24 hours or as told by your doctor.  Only take medicines as told by your doctor. If a tissue sample (biopsy) was taken during the procedure:   Do not take aspirin or blood thinners for 7 days, or as told by your doctor.  Do not drink alcohol for 7 days, or as told by your doctor.  Eat soft foods for the first 24 hours. GET HELP IF: You still have a small amount of blood in your poop (stool) 2-3 days after the procedure. GET HELP RIGHT AWAY IF:  You have more than a small amount of blood in your poop.  You see clumps of tissue (blood clots) in your poop.  Your belly is puffy (swollen).  You feel sick to your stomach (nauseous) or throw up (vomit).  You have a fever.  You have belly pain that gets worse and medicine does not help. MAKE SURE YOU:  Understand these instructions.  Will watch your condition.  Will get help right away if you are not doing well or get worse. Document Released: 11/19/2010 Document Revised: 10/22/2013 Document Reviewed: 06/24/2013 [facility] Patient Information 2015  [place], [place]. This information is not intended to replace advice given to you by your health care provider. Make sure you discuss any questions you have with your health care provider.  Colon Polyps Polyps are lumps of extra tissue growing inside the body. Polyps can grow in the large intestine (colon). Most colon polyps are noncancerous (benign). However, some colon polyps can become cancerous over time. Polyps that are larger than a pea may be harmful. To be safe, caregivers remove and test all polyps. CAUSES  Polyps form when mutations in the genes cause your cells to grow and divide even though no more tissue is needed. RISK FACTORS There are a number of risk factors that can increase your chances of getting colon polyps. They include:  Being older than 50 years.  Family history of colon polyps or colon cancer.  Long-term colon diseases, such as colitis or Crohn disease.  Being overweight.  Smoking.  Being inactive.  Drinking too much alcohol. SYMPTOMS  Most small polyps do not cause symptoms. If symptoms are present, they may include:  Blood in the stool. The stool may look dark red or black.  Constipation or diarrhea that lasts longer than 1 week. DIAGNOSIS People often do not know they have polyps until their caregiver finds them during a regular checkup. Your caregiver can use 4 tests to check for polyps:  Digital rectal exam. The caregiver wears gloves and  feels inside the rectum. This test would find polyps only in the rectum.  Barium enema. The caregiver puts a liquid called barium into your rectum before taking X-rays of your colon. Barium makes your colon look white. Polyps are dark, so they are easy to see in the X-ray pictures.  Sigmoidoscopy. A thin, flexible tube (sigmoidoscope) is placed into your rectum. The sigmoidoscope has a light and tiny camera in it. The caregiver uses the sigmoidoscope to look at the last third of your colon.  Colonoscopy. This test is  like sigmoidoscopy, but the caregiver looks at the entire colon. This is the most common method for finding and removing polyps. TREATMENT  Any polyps will be removed during a sigmoidoscopy or colonoscopy. The polyps are then tested for cancer. PREVENTION  To help lower your risk of getting more colon polyps:  Eat plenty of fruits and vegetables. Avoid eating fatty foods.  Do not smoke.  Avoid drinking alcohol.  Exercise every day.  Lose weight if recommended by your caregiver.  Eat plenty of calcium and folate. Foods that are rich in calcium include milk, cheese, and broccoli. Foods that are rich in folate include chickpeas, kidney beans, and spinach. HOME CARE INSTRUCTIONS Keep all follow-up appointments as directed by your caregiver. You may need periodic exams to check for polyps. SEEK MEDICAL CARE IF: You notice bleeding during a bowel movement. Document Released: 07/13/2004 Document Revised: 01/09/2012 Document Reviewed: 12/27/2011 [facility]  Patient Information 2015 [place], [place]. This information is not intended to replace advice given to you by your health care provider. Make sure you discuss any questions you have with your health care provider.

## 2015-02-26 NOTE — H&P (Signed)
[name] Foster is an 59 y.o. male.   Chief Complaint: Patient is here for colonoscopy. HPI: Patient is 59 year old African-American male who is here for screening colonoscopy. He denies abdominal pain change in bowel habits or rectal bleeding. Patient states his last colonoscopy was 10 years ago by Dr. Irving Shows and he was advised to come back in 10 years. Family history is negative for CRC.  Past Medical History  Diagnosis Date  . Benign essential hypertension   . Abnormal EKG   . Tobacco abuse   . Apical variant hypertrophic cardiomyopathy     Past Surgical History  Procedure Laterality Date  . Cardiac catheterization      2008  . Kidney surgery Right April 2015    benign tumor removal    Family History  Problem Relation Age of Onset  . Diabetes Other   . Hypertension Other   . Diabetes Mother    Social History:  reports that he has been smoking Cigarettes.  He has a 28 pack-year smoking history. He has never used smokeless tobacco. He reports that he does not drink alcohol or use illicit drugs.  Allergies: No Known Allergies  Medications Prior to Admission  Medication Sig Dispense Refill  . Cyanocobalamin (VITAMIN B12 PO) Take 50 mcg by mouth daily.     . sildenafil (VIAGRA) 100 MG tablet Take 1 tablet (100 mg total) by mouth daily as needed for erectile dysfunction. 6 tablet 6  . Cholecalciferol (VITAMIN D) 400 UNITS capsule Take 1 tablet by mouth daily.     . hydrochlorothiazide (HYDRODIURIL) 25 MG tablet Take 1 tablet (25 mg total) by mouth daily. 90 tablet 0  . metoprolol tartrate (LOPRESSOR) 25 MG tablet Take 1 tablet (25 mg total) by mouth 2 (two) times daily. 180 tablet 0  . Multiple Vitamins-Minerals (MULTIVITAMIN PO) Take 1 tablet by mouth daily.    . nicotine (NICODERM CQ) 21 mg/24hr patch Place 1 patch (21 mg total) onto the skin daily. (Patient not taking: Reported on 02/13/2015) 28 patch 0  . Omega-3 1000 MG CAPS Take 1 g by mouth daily.     . peg 3350 powder  (MOVIPREP) 100 G SOLR Take 1 kit (200 g total) by mouth once. 1 kit 0  . potassium chloride SA (K-DUR,KLOR-CON) 20 MEQ tablet Take 1 tablet (20 mEq total) by mouth daily. 30 tablet 0  . tamsulosin (FLOMAX) 0.4 MG CAPS capsule Take 0.4 mg by mouth daily.  11    No results found for this or any previous visit (from the past 48 hour(s)). No results found.  ROS  Blood pressure 116/85, temperature 97.7 F (36.5 C), temperature source Oral, resp. rate 18, height '5\' 10"'  (1.778 m), weight 154 lb (69.854 kg), SpO2 99 %. Physical Exam  Constitutional: He appears well-developed and well-nourished.  HENT:  Mouth/Throat: Oropharynx is clear and moist.  Eyes: Conjunctivae are normal. No scleral icterus.  Neck: No thyromegaly present.  Cardiovascular: Normal rate, regular rhythm and normal heart sounds.   No murmur heard. Respiratory: Effort normal and breath sounds normal.  GI: Soft. He exhibits no distension and no mass. There is no tenderness.  Musculoskeletal: He exhibits no edema.  Lymphadenopathy:    He has no cervical adenopathy.  Neurological: He is alert.  Skin: Skin is warm.     Assessment/Plan Average risk screening colonoscopy.  REHMAN,NAJEEB U 02/26/2015, 8:30 AM

## 2015-02-26 NOTE — Op Note (Addendum)
COLONOSCOPY PROCEDURE REPORT  PATIENT:  [name] Foster  MR#:  [id number] Birthdate:  1956-01-14, 59 y.o., male Endoscopist:  Dr. Rogene Houston, MD Referred By:  Dr. Candace Cruise, MD  Procedure Date: 02/26/2015  Procedure:   Colonoscopy with snare polypectomy and APC ablation of cecal AV malformation.  Indications: Patient is 59 year old African-American male who is here for average risk screening colonoscopy. Patient underwent colonoscopy in January 2005 by Dr. Tamala Julian with removal of 3 polyps and they were inflammatory. Therefore patient was advised to return for follow-up exam in 10 years.  Informed Consent:  The procedure and risks were reviewed with the patient and informed consent was obtained.  Medications:  Demerol 50 mg IV Versed 7 mg IV  Description of procedure:  After a digital rectal exam was performed, that colonoscope was advanced from the anus through the rectum and colon to the area of the cecum, ileocecal valve and appendiceal orifice. The cecum was deeply intubated. These structures were well-seen and photographed for the record. From the level of the cecum and ileocecal valve, the scope was slowly and cautiously withdrawn. The mucosal surfaces were carefully surveyed utilizing scope tip to flexion to facilitate fold flattening as needed. The scope was pulled down into the rectum where a thorough exam including retroflexion was performed.  Findings:   Prep satisfactory. Two cecal and two ascending colon AV malformations noted. One cecal AV malformation was large and partly covering edge of 5 mm cecal polyp. Cecal polyp was hot snared resulting in oozing from AV malformation which was then ablated with argon plasma coagulation. Other 3 AV malformations were not treated. Few small diverticula at sigmoid colon. 7 mm rectal polyp hot snared. Small hemorrhoids below the dentate line.   Therapeutic/Diagnostic Maneuvers Performed:  See above  Complications:  None  Cecal  Withdrawal Time:  20 minutes  Impression:  Examination performed to cecum. Two cecal AV malformations noted. 5 mm cecal polyp partially covered with AV malformation. This polyp was hot snared and bleeding from AV malformation was controlled with argon plasma coagulation of AV malformation. Two small AV malformation that ascending colon. These were nonbleeding and left alone. Mild sigmoid colon diverticulosis. 7 mm rectal polyp hot snared. Small external hemorrhoids.  Recommendations:  Standard instructions given. No aspirin or NSAIDs for 1 week. Check CBC today. I will contact patient with biopsy results and further recommendations.  Abbiegail Landgren U  02/26/2015 9:19 AM  CC: Dr. Wardell Honour, MD & Dr. Rayne Du ref. provider found

## 2015-02-27 ENCOUNTER — Encounter (HOSPITAL_COMMUNITY): Payer: Self-pay | Admitting: Internal Medicine

## 2015-03-04 ENCOUNTER — Encounter (INDEPENDENT_AMBULATORY_CARE_PROVIDER_SITE_OTHER): Payer: Self-pay

## 2015-03-06 ENCOUNTER — Telehealth: Payer: Self-pay | Admitting: Family Medicine

## 2015-03-06 NOTE — Telephone Encounter (Signed)
Detailed message left advising patient to fast for his labs especially if he is wanting to be checked for diabetes.

## 2015-03-11 ENCOUNTER — Ambulatory Visit (INDEPENDENT_AMBULATORY_CARE_PROVIDER_SITE_OTHER): Payer: BLUE CROSS/BLUE SHIELD | Admitting: Family Medicine

## 2015-03-11 ENCOUNTER — Encounter: Payer: Self-pay | Admitting: Family Medicine

## 2015-03-11 VITALS — BP 122/86 | HR 82 | Temp 97.7°F | Ht 70.0 in | Wt 167.0 lb

## 2015-03-11 DIAGNOSIS — H02829 Cysts of unspecified eye, unspecified eyelid: Secondary | ICD-10-CM

## 2015-03-11 DIAGNOSIS — R9431 Abnormal electrocardiogram [ECG] [EKG]: Secondary | ICD-10-CM | POA: Diagnosis not present

## 2015-03-11 DIAGNOSIS — E876 Hypokalemia: Secondary | ICD-10-CM

## 2015-03-11 DIAGNOSIS — I10 Essential (primary) hypertension: Secondary | ICD-10-CM

## 2015-03-11 MED ORDER — HYDROCHLOROTHIAZIDE 25 MG PO TABS: 25.0000 mg | ORAL_TABLET | Freq: Every day | ORAL | Status: DC

## 2015-03-11 MED ORDER — SILDENAFIL CITRATE 20 MG PO TABS: ORAL_TABLET | ORAL | Status: DC

## 2015-03-11 MED ORDER — METOPROLOL TARTRATE 25 MG PO TABS: 25.0000 mg | ORAL_TABLET | Freq: Two times a day (BID) | ORAL | Status: DC

## 2015-03-11 MED ORDER — POTASSIUM CHLORIDE CRYS ER 20 MEQ PO TBCR: 20.0000 meq | EXTENDED_RELEASE_TABLET | Freq: Every day | ORAL | Status: DC

## 2015-03-11 NOTE — Addendum Note (Signed)
Addended by: Ilean China on: 03/11/2015 11:23 AM   Modules accepted: Orders

## 2015-03-11 NOTE — Addendum Note (Signed)
Addended by: Pollyann Kennedy F on: 03/11/2015 11:25 AM   Modules accepted: Orders

## 2015-03-11 NOTE — Progress Notes (Signed)
Subjective:    Patient ID: [name] Foster, male    DOB: 07/21/1956, 59 y.o.   MRN: [id number]  HPI 59 year old gentleman here to follow-up hypertension. We talked about his use of beta blocker in view of his symptoms of erectile dysfunction and were prepared to begin another antihypertensive in place of the beta blocker but in review of his past history see there is some nonspecific chest pain as well as cardiomyopathy and decided to leave him on the beta blocker.  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 Urologist who did the surgery also started him on Flomax for enlarged prostate. There is no history of prostate cancer in the family.  Patient Active Problem List   Diagnosis Date Noted  . Hypokalemia   . Preop cardiovascular exam 02/24/2014  . Apical variant hypertrophic cardiomyopathy 02/24/2014  . Tobacco abuse 02/24/2014  . HYPERTENSION, BENIGN 12/12/2008  . CHEST PAIN-UNSPECIFIED 12/12/2008  . ABNORMAL EKG 12/12/2008   Outpatient Encounter Prescriptions as of 03/11/2015  Medication Sig  . Cholecalciferol (VITAMIN D) 400 UNITS capsule Take 1 tablet by mouth daily.   . Cyanocobalamin (VITAMIN B12 PO) Take 50 mcg by mouth daily.   . hydrochlorothiazide (HYDRODIURIL) 25 MG tablet Take 1 tablet (25 mg total) by mouth daily.  . metoprolol tartrate (LOPRESSOR) 25 MG tablet Take 1 tablet (25 mg total) by mouth 2 (two) times daily.  . Multiple Vitamins-Minerals (MULTIVITAMIN PO) Take 1 tablet by mouth daily.  . Omega-3 1000 MG CAPS Take 1 g by mouth daily.   . potassium chloride SA (K-DUR,KLOR-CON) 20 MEQ tablet Take 1 tablet (20 mEq total) by mouth daily.  . sildenafil (VIAGRA) 100 MG tablet Take 1 tablet (100 mg total) by mouth daily as needed for erectile dysfunction.  . tamsulosin (FLOMAX) 0.4 MG CAPS capsule Take 0.4 mg by mouth daily.  . [DISCONTINUED] nicotine (NICODERM CQ) 21 mg/24hr patch Place 1 patch (21 mg total) onto  the skin daily.   No facility-administered encounter medications on file as of 03/11/2015.       Review of Systems  Constitutional: Negative.   HENT: Negative.   Respiratory: Negative.   Cardiovascular: Negative.   Neurological: Negative.   Psychiatric/Behavioral: Negative.        Objective:   Physical Exam  Constitutional: He is oriented to person, place, and time. He appears well-developed and well-nourished.  HENT:  Head: Normocephalic.  Neck: Normal range of motion.  Cardiovascular: Normal rate and regular rhythm.   Pulmonary/Chest: Effort normal and breath sounds normal.  Abdominal: Soft.  Neurological: He is alert and oriented to person, place, and time.  Psychiatric: He has a normal mood and affect.      BP 122/86 mmHg  Pulse 82  Temp(Src) 97.7 F (36.5 C) (Oral)  Ht _0  (1.778 m)  Wt 167 lb (75.751 kg)  BMI 23.96 kg/m2     Assessment & Plan:  1. HYPERTENSION, BENIGN Pressure is controlled on current regimen of metoprolol and hydrochlorothiazide. We'll plan to continue same - BMP8+EGFR - Lipid panel  2. Hypokalemia Patient is aware of symptoms of hypokalemia such as fatigue and weakness and he has not had those symptoms. It has been several years since we have checked his electrolytes and renal function and I think he deserves that at least once a year - BMP8+EGFR   3. Essential hypertension See above for hypertension - metoprolol tartrate (LOPRESSOR) 25 MG  tablet; Take 1 tablet (25 mg total) by mouth 2 (two) times daily.  Dispense: 180 tablet; Refill: 1  4. Nonspecific abnormal electrocardiogram (ECG) (EKG) History of cardiomyopathy - metoprolol tartrate (LOPRESSOR) 25 MG tablet; Take 1 tablet (25 mg total) by mouth 2 (two) times daily.  Dispense: 180 tablet; Refill: 1  5. Eyelid cyst, unspecified laterality There are bilateral cystic areas at the temporal corner of each I believe ophthalmology consult would be appropriate Re: Removal  Wardell Honour MD - Ambulatory referral to Ophthalmology

## 2015-03-12 LAB — CMP14+EGFR
A/G RATIO: 2 (ref 1.1–2.5)
ALT: 21 IU/L (ref 0–44)
AST: 15 IU/L (ref 0–40)
Albumin: 4.9 g/dL (ref 3.5–5.5)
Alkaline Phosphatase: 104 IU/L (ref 39–117)
BUN/Creatinine Ratio: 16 (ref 9–20)
BUN: 16 mg/dL (ref 6–24)
Bilirubin Total: 0.2 mg/dL (ref 0.0–1.2)
CALCIUM: 9.9 mg/dL (ref 8.7–10.2)
CO2: 22 mmol/L (ref 18–29)
Chloride: 102 mmol/L (ref 97–108)
Creatinine, Ser: 0.98 mg/dL (ref 0.76–1.27)
GFR, EST AFRICAN AMERICAN: 99 mL/min/{1.73_m2} (ref >59)
GFR, EST NON AFRICAN AMERICAN: 86 mL/min/{1.73_m2} (ref >59)
Globulin, Total: 2.4 g/dL (ref 1.5–4.5)
Glucose: 112 mg/dL — ABNORMAL HIGH (ref 65–99)
Potassium: 4.2 mmol/L (ref 3.5–5.2)
Sodium: 140 mmol/L (ref 134–144)
TOTAL PROTEIN: 7.3 g/dL (ref 6.0–8.5)

## 2015-03-12 LAB — LIPID PANEL
CHOL/HDL RATIO: 3 ratio (ref 0.0–5.0)
Cholesterol, Total: 128 mg/dL (ref 100–199)
HDL: 42 mg/dL (ref >39)
LDL Calculated: 72 mg/dL (ref 0–99)
TRIGLYCERIDES: 72 mg/dL (ref 0–149)
VLDL Cholesterol Cal: 14 mg/dL (ref 5–40)

## 2015-03-12 LAB — SPECIMEN STATUS REPORT

## 2015-03-16 NOTE — Progress Notes (Signed)
Lab results are okay

## 2015-03-18 ENCOUNTER — Telehealth: Payer: Self-pay | Admitting: *Deleted

## 2015-03-18 NOTE — Telephone Encounter (Signed)
-----   Message from Wardell Honour, MD sent at 03/18/2015  8:58 AM EDT ----- Chemistries show improved blood sugar, all other parameters including renal function electrolytes and liver enzymes are normal. Lipids are in excellent shape. No changes are recommended

## 2015-03-18 NOTE — Telephone Encounter (Signed)
Patient aware of lab results.

## 2015-03-19 ENCOUNTER — Telehealth: Payer: Self-pay | Admitting: *Deleted

## 2015-03-19 NOTE — Telephone Encounter (Signed)
-----   Message from Wardell Honour, MD sent at 03/18/2015  8:58 AM EDT ----- Chemistries show improved blood sugar, all other parameters including renal function electrolytes and liver enzymes are normal. Lipids are in excellent shape. No changes are recommended

## 2015-03-19 NOTE — Telephone Encounter (Signed)
Left detailed message regarding lab results. 

## 2015-07-30 ENCOUNTER — Other Ambulatory Visit: Payer: Self-pay | Admitting: Family Medicine

## 2015-07-31 MED ORDER — SILDENAFIL CITRATE 100 MG PO TABS: 100.0000 mg | ORAL_TABLET | Freq: Every day | ORAL | Status: DC | PRN

## 2015-07-31 NOTE — Telephone Encounter (Signed)
[facility]  

## 2015-08-01 NOTE — Telephone Encounter (Signed)
Prescription sent to pharmacy  Lilibeth Opie A. Bonnie Overdorf PA-C   

## 2015-08-10 ENCOUNTER — Telehealth: Payer: Self-pay | Admitting: Family Medicine

## 2015-08-10 NOTE — Telephone Encounter (Signed)
Patient taken care of.

## 2015-08-11 ENCOUNTER — Ambulatory Visit (INDEPENDENT_AMBULATORY_CARE_PROVIDER_SITE_OTHER): Payer: BLUE CROSS/BLUE SHIELD

## 2015-08-11 DIAGNOSIS — Z23 Encounter for immunization: Secondary | ICD-10-CM

## 2015-08-27 ENCOUNTER — Other Ambulatory Visit: Payer: Self-pay | Admitting: Family Medicine

## 2015-12-03 DIAGNOSIS — Z7689 Persons encountering health services in other specified circumstances: Secondary | ICD-10-CM | POA: Insufficient documentation

## 2015-12-24 ENCOUNTER — Encounter: Payer: Self-pay | Admitting: Family

## 2015-12-24 ENCOUNTER — Ambulatory Visit (INDEPENDENT_AMBULATORY_CARE_PROVIDER_SITE_OTHER): Payer: Worker's Compensation

## 2015-12-24 ENCOUNTER — Ambulatory Visit (INDEPENDENT_AMBULATORY_CARE_PROVIDER_SITE_OTHER): Payer: Worker's Compensation | Admitting: Family

## 2015-12-24 VITALS — BP 127/82 | HR 76 | Temp 98.1°F | Ht 71.0 in | Wt 168.0 lb

## 2015-12-24 DIAGNOSIS — S4992XA Unspecified injury of left shoulder and upper arm, initial encounter: Secondary | ICD-10-CM

## 2015-12-24 DIAGNOSIS — S46912A Strain of unspecified muscle, fascia and tendon at shoulder and upper arm level, left arm, initial encounter: Secondary | ICD-10-CM | POA: Diagnosis not present

## 2015-12-24 MED ORDER — NAPROXEN 500 MG PO TABS
500.0000 mg | ORAL_TABLET | Freq: Two times a day (BID) | ORAL | Status: DC
Start: 2015-12-24 — End: 2016-09-02

## 2015-12-24 MED ORDER — METHYLPREDNISOLONE ACETATE 80 MG/ML IJ SUSP
80.0000 mg | Freq: Once | INTRAMUSCULAR | Status: AC
  Administered 2015-12-24: 80 mg via INTRAMUSCULAR

## 2015-12-24 MED ORDER — CYCLOBENZAPRINE HCL 5 MG PO TABS: 5.0000 mg | ORAL_TABLET | Freq: Three times a day (TID) | ORAL | Status: DC | PRN

## 2015-12-24 MED ORDER — KETOROLAC TROMETHAMINE 60 MG/2ML IM SOLN
60.0000 mg | Freq: Once | INTRAMUSCULAR | Status: AC
  Administered 2015-12-24: 60 mg via INTRAMUSCULAR

## 2015-12-24 NOTE — Progress Notes (Addendum)
   Subjective:    Patient ID: [name] Foster, male    DOB: 1956/08/03, 60 y.o.   MRN: [id number]   HPI Pt's presents to the office today for Workers Comp injury of left shoulder. Pt states the injury occurred on 12/24/15. Pt reports getting into a "man hole" and fell and his left shoulder "got twisted". PT reports constant pain 7 out 10. Pt states he can not lift his arm.    Review of Systems  Constitutional: Negative.   HENT: Negative.   Respiratory: Negative.   Cardiovascular: Negative.   Gastrointestinal: Negative.   Endocrine: Negative.   Genitourinary: Negative.   Musculoskeletal: Negative.   Neurological: Negative.   Hematological: Negative.   Psychiatric/Behavioral: Negative.   All other systems reviewed and are negative.      Objective:   Physical Exam  Constitutional: He is oriented to person, place, and time. He appears well-developed and well-nourished. No distress.  HENT:  Head: Normocephalic.  Eyes: Pupils are equal, round, and reactive to light. Right eye exhibits no discharge. Left eye exhibits no discharge.  Neck: Normal range of motion. Neck supple. No thyromegaly present.  Cardiovascular: Normal rate, regular rhythm, normal heart sounds and intact distal pulses.   No murmur heard. Pulmonary/Chest: Effort normal and breath sounds normal. No respiratory distress. He has no wheezes.  Abdominal: Soft. Bowel sounds are normal. He exhibits no distension. There is no tenderness.  Musculoskeletal:  Limited ROM of left shoulder with abduction, rotation  Neurological: He is alert and oriented to person, place, and time. He has normal reflexes. No cranial nerve deficit.  Skin: Skin is warm and dry. No rash noted. No erythema.  Psychiatric: He has a normal mood and affect. His behavior is normal. Judgment and thought content normal.  Vitals reviewed.   Left shoulder x-ray- WNL Preliminary reading by Evelina Dun, FNP [facility]   BP 127/82 mmHg  Pulse 76  Temp(Src) 98.1 F  (36.7 C) (Oral)  Ht 5\' 11"  (1.803 m)  Wt 168 lb (76.204 kg)  BMI 23.44 kg/m2     Assessment & Plan:  1. Injury of left shoulder, initial encounter - DG Shoulder Left  2. Shoulder strain, left, initial encounter -Rest -Ice -Sedation precautions discussed -RTO in 1 week if symptoms do not improve may need MRI - ketorolac (TORADOL) injection 60 mg; Inject 2 mLs (60 mg total) into the muscle once. - methylPREDNISolone acetate (DEPO-MEDROL) injection 80 mg; Inject 1 mL (80 mg total) into the muscle once. - naproxen (NAPROSYN) 500 MG tablet; Take 1 tablet (500 mg total) by mouth 2 (two) times daily with a meal.  Dispense: 60 tablet; Refill: 1 - cyclobenzaprine (FLEXERIL) 5 MG tablet; Take 1 tablet (5 mg total) by mouth 3 (three) times daily as needed for muscle spasms.  Dispense: 30 tablet; Refill: 0  Evelina Dun, FNP

## 2015-12-24 NOTE — Addendum Note (Signed)
Addended by: Shelbie Ammons on: 12/24/2015 03:44 PM   Modules accepted: Miquel Dunn

## 2015-12-24 NOTE — Patient Instructions (Signed)
Shoulder Sprain °A shoulder sprain is a partial or complete tear in one of the tough, fiber-like tissues (ligaments) in the shoulder. The ligaments in the shoulder help to hold the shoulder in place. °CAUSES °This condition may be caused by: °· A fall. °· A hit to the shoulder. °· A twist of the arm. °RISK FACTORS °This condition is more likely to develop in: °· People who play sports. °· People who have problems with balance or coordination. °SYMPTOMS °Symptoms of this condition include: °· Pain when moving the shoulder. °· Limited ability to move the shoulder. °· Swelling and tenderness on top of the shoulder. °· Warmth in the shoulder. °· A change in the shape of the shoulder. °· Redness or bruising on the shoulder. °DIAGNOSIS °This condition is diagnosed with a physical exam. During the exam, you may be asked to do simple exercises with your shoulder. You may also have imaging tests, such as X-rays, MRI, or a CT scan. These tests can show how severe the sprain is. °TREATMENT °This condition may be treated with: °· Rest. °· Pain medicine. °· Ice. °· A sling or brace. This is used to keep the arm still while the shoulder is healing. °· Physical therapy or rehabilitation exercises. These help to improve the range of motion and strength of the shoulder. °· Surgery (rare). Surgery may be needed if the sprain caused a joint to become unstable. Surgery may also be needed to reduce pain. °Some people may develop ongoing shoulder pain or lose some range of motion in the shoulder. However, most people do not develop long-term problems. °HOME CARE INSTRUCTIONS °· Rest. °· Take over-the-counter and prescription medicines only as told by your health care provider. °· If directed, apply ice to the area: °¨ Put ice in a plastic bag. °¨ Place a towel between your skin and the bag. °¨ Leave the ice on for 20 minutes, 2-3 times per day. °· If you were given a shoulder sling or brace: °¨ Wear it as told. °¨ Remove it to shower or  bathe. °¨ Move your arm only as much as told by your health care provider, but keep your hand moving to prevent swelling. °· If you were shown how to do any exercises, do them as told by your health care provider. °· Keep all follow-up visits as told by your health care provider. This is important. °SEEK MEDICAL CARE IF: °· Your pain gets worse. °· Your pain is not relieved with medicines. °· You have increased redness or swelling. °SEEK IMMEDIATE MEDICAL CARE IF: °· You have a fever. °· You cannot move your arm or shoulder. °· You develop numbness or tingling in your arms, hands, or fingers. °  °This information is not intended to replace advice given to you by your health care provider. Make sure you discuss any questions you have with your health care provider. °  °Document Released: 03/05/2009 Document Revised: 07/08/2015 Document Reviewed: 02/09/2015 °[employer]vier Interactive Patient Education ©2016 [employer] ° °

## 2015-12-24 NOTE — Addendum Note (Signed)
Addended by: Evelina Dun A on: 12/24/2015 03:12 PM   Modules accepted: SmartSet

## 2015-12-25 ENCOUNTER — Other Ambulatory Visit: Payer: Self-pay | Admitting: Family

## 2015-12-25 DIAGNOSIS — S4992XA Unspecified injury of left shoulder and upper arm, initial encounter: Secondary | ICD-10-CM

## 2016-01-01 ENCOUNTER — Ambulatory Visit (INDEPENDENT_AMBULATORY_CARE_PROVIDER_SITE_OTHER): Payer: Worker's Compensation | Admitting: Family

## 2016-01-01 ENCOUNTER — Encounter: Payer: Self-pay | Admitting: Family

## 2016-01-01 VITALS — BP 119/80 | HR 81 | Temp 98.3°F | Ht 71.0 in | Wt 167.6 lb

## 2016-01-01 DIAGNOSIS — S43402D Unspecified sprain of left shoulder joint, subsequent encounter: Secondary | ICD-10-CM

## 2016-01-01 DIAGNOSIS — S46002D Unspecified injury of muscle(s) and tendon(s) of the rotator cuff of left shoulder, subsequent encounter: Secondary | ICD-10-CM

## 2016-01-01 NOTE — Progress Notes (Signed)
   Subjective:    Patient ID: [name] Foster, male    DOB: 1956-05-16, 60 y.o.   MRN: [id number]  HPI PT presents to the office today for a Worker Comp for the [place] injury of his left shoulder strain with a possible rototor cuff injury. PT's injury occurred on 12/24/15. PT states he continues to have intermittent pain of 6 out 10 when he attempts to lift his arm up. PT states he still can not lift his arm greater than 30 degrees.    Review of Systems  Constitutional: Negative.   HENT: Negative.   Respiratory: Negative.   Cardiovascular: Negative.   Gastrointestinal: Negative.   Endocrine: Negative.   Genitourinary: Negative.   Musculoskeletal: Negative.   Neurological: Negative.   Hematological: Negative.   Psychiatric/Behavioral: Negative.   All other systems reviewed and are negative.      Objective:   Physical Exam  Constitutional: He is oriented to person, place, and time. He appears well-developed and well-nourished. No distress.  HENT:  Head: Normocephalic.  Eyes: Pupils are equal, round, and reactive to light. Right eye exhibits no discharge. Left eye exhibits no discharge.  Neck: Normal range of motion. Neck supple. No thyromegaly present.  Cardiovascular: Normal rate, regular rhythm, normal heart sounds and intact distal pulses.   No murmur heard. Pulmonary/Chest: Effort normal and breath sounds normal. No respiratory distress. He has no wheezes.  Abdominal: Soft. Bowel sounds are normal. He exhibits no distension. There is no tenderness.  Musculoskeletal: He exhibits no edema or tenderness.  Unable to flex, rotate, or elevate left shoulder greater than >45 degrees. Pt has passive movement   Neurological: He is alert and oriented to person, place, and time. He has normal reflexes. No cranial nerve deficit.  Skin: Skin is warm and dry. No rash noted. No erythema.  Psychiatric: He has a normal mood and affect. His behavior is normal. Judgment and thought content  normal.  Vitals reviewed.   BP 119/80 mmHg  Pulse 81  Temp(Src) 98.3 F (36.8 C) (Oral)  Ht 5\' 11"  (1.803 m)  Wt 167 lb 9.6 oz (76.023 kg)  BMI 23.39 kg/m2       Assessment & Plan:  1. Rotator cuff injury, left, subsequent encounter  2. Shoulder sprain, left, subsequent encounter  I believe pt has a rotator cuff tear? MRI pending waiting on Insurance. Depending on results will send to Ortho.  RTO in 2 weeks   Evelina Dun, FNP

## 2016-01-01 NOTE — Patient Instructions (Signed)
Rotator Cuff Injury °Rotator cuff injury is any type of injury to the set of muscles and tendons that make up the stabilizing unit of your shoulder. This unit holds the ball of your upper arm bone (humerus) in the socket of your shoulder blade (scapula).  °CAUSES °Injuries to your rotator cuff most commonly come from sports or activities that cause your arm to be moved repeatedly over your head. Examples of this include throwing, weight lifting, swimming, or racquet sports. Long lasting (chronic) irritation of your rotator cuff can cause soreness and swelling (inflammation), bursitis, and eventual damage to your tendons, such as a tear (rupture). °SIGNS AND SYMPTOMS °Acute rotator cuff tear: °· Sudden tearing sensation followed by severe pain shooting from your upper shoulder down your arm toward your elbow. °· Decreased range of motion of your shoulder because of pain and muscle spasm. °· Severe pain. °· Inability to raise your arm out to the side because of pain and loss of muscle power (large tears). °Chronic rotator cuff tear: °· Pain that usually is worse at night and may interfere with sleep. °· Gradual weakness and decreased shoulder motion as the pain worsens. °· Decreased range of motion. °Rotator cuff tendinitis:  °· Deep ache in your shoulder and the outside upper arm over your shoulder. °· Pain that comes on gradually and becomes worse when lifting your arm to the side or turning it inward. °DIAGNOSIS °Rotator cuff injury is diagnosed through a medical history, physical exam, and imaging exam. The medical history helps determine the type of rotator cuff injury. Your health care provider will look at your injured shoulder, feel the injured area, and ask you to move your shoulder in different positions. X-ray exams typically are done to rule out other causes of shoulder pain, such as fractures. MRI is the exam of choice for the most severe shoulder injuries because the images show muscles and tendons.    °TREATMENT  °Chronic tear: °· Medicine for pain, such as acetaminophen or ibuprofen. °· Physical therapy and range-of-motion exercises may be helpful in maintaining shoulder function and strength. °· Steroid injections into your shoulder joint. °· Surgical repair of the rotator cuff if the injury does not heal with noninvasive treatment. °Acute tear: °· Anti-inflammatory medicines such as ibuprofen and naproxen to help reduce pain and swelling. °· A sling to help support your arm and rest your rotator cuff muscles. Long-term use of a sling is not advised. It may cause significant stiffening of the shoulder joint. °· Surgery may be considered within a few weeks, especially in younger, active people, to return the shoulder to full function. °· Indications for surgical treatment include the following: °¨ Age younger than 60 years. °¨ Rotator cuff tears that are complete. °¨ Physical therapy, rest, and anti-inflammatory medicines have been used for 6-8 weeks, with no improvement. °¨ Employment or sporting activity that requires constant shoulder use. °Tendinitis: °· Anti-inflammatory medicines such as ibuprofen and naproxen to help reduce pain and swelling. °· A sling to help support your arm and rest your rotator cuff muscles. Long-term use of a sling is not advised. It may cause significant stiffening of the shoulder joint. °· Severe tendinitis may require: °¨ Steroid injections into your shoulder joint. °¨ Physical therapy. °¨ Surgery. °HOME CARE INSTRUCTIONS  °· Apply ice to your injury: °¨ Put ice in a plastic bag. °¨ Place a towel between your skin and the bag. °¨ Leave the ice on for 20 minutes, 2-3 times a day. °· If you   have a shoulder immobilizer (sling and straps), wear it until told otherwise by your health care provider. °· You may want to sleep on several pillows or in a recliner at night to lessen swelling and pain. °· Only take over-the-counter or prescription medicines for pain, discomfort, or fever as  directed by your health care provider. °· Do simple hand squeezing exercises with a soft rubber ball to decrease hand swelling. °SEEK MEDICAL CARE IF:  °· Your shoulder pain increases, or new pain or numbness develops in your arm, hand, or fingers. °· Your hand or fingers are colder than your other hand. °SEEK IMMEDIATE MEDICAL CARE IF:  °· Your arm, hand, or fingers are numb or tingling. °· Your arm, hand, or fingers are increasingly swollen and painful, or they turn white or blue. °MAKE SURE YOU: °· Understand these instructions. °· Will watch your condition. °· Will get help right away if you are not doing well or get worse. °  °This information is not intended to replace advice given to you by your health care provider. Make sure you discuss any questions you have with your health care provider. °  °Document Released: 10/14/2000 Document Revised: 10/22/2013 Document Reviewed: 05/29/2013 °Elsevier Interactive Patient Education ©2016 [employer] ° °

## 2016-01-06 ENCOUNTER — Other Ambulatory Visit: Payer: Self-pay | Admitting: Family Medicine

## 2016-01-06 DIAGNOSIS — M75102 Unspecified rotator cuff tear or rupture of left shoulder, not specified as traumatic: Secondary | ICD-10-CM

## 2016-01-07 ENCOUNTER — Telehealth: Payer: Self-pay | Admitting: Family Medicine

## 2016-01-07 NOTE — Telephone Encounter (Signed)
Talked w/ pt who gave me his case worker, Betty's number [phone] #

## 2016-01-14 ENCOUNTER — Other Ambulatory Visit: Payer: Self-pay | Admitting: Orthopedic Surgery

## 2016-01-14 NOTE — H&P (Signed)
[name] Foster is an 60 y.o. male.   CC / Reason for Visit: Left shoulder problem HPI: This patient is a right-hand dominant 60 year old male who works for [employer] works and fell into [employer] the catching himself with his left upper extremity.  He was seen by [facility] who placed him on naproxen and sent him for an MRI study.  The MRI results indicated that the patient has a full-thickness tear of both the supraspinatous and the infraspinatus tendon and he was sent to [place] for further evaluation and surgical management.  The patient indicates that he is currently on Keflex for prostate surgery that he had 3 weeks ago as well HCTZ and  methylpropranolol for which Dr. Stanford Breed, cardiologist, follows him.  Past Medical History  Diagnosis Date  . Benign essential hypertension   . Abnormal EKG   . Tobacco abuse   . Apical variant hypertrophic cardiomyopathy ([place])   . Hypokalemia     Past Surgical History  Procedure Laterality Date  . Cardiac catheterization      2008  . Kidney surgery Right April 2015    benign tumor removal  . Colonoscopy N/A 02/26/2015    Procedure: COLONOSCOPY;  Surgeon: Rogene Houston, MD;  Location: [facility];  Service: Endoscopy;  Laterality: N/A;  35    Family History  Problem Relation Age of Onset  . Diabetes Other   . Hypertension Other   . Diabetes Mother    Social History:  reports that he has been smoking Cigarettes.  He has a 28 pack-year smoking history. He has never used smokeless tobacco. He reports that he does not drink alcohol or use illicit drugs.  Allergies: No Known Allergies  No prescriptions prior to admission    No results found for this or any previous visit (from the past 48 hour(s)). No results found.  Review of Systems  All other systems reviewed and are negative.   There were no vitals taken for this visit. Physical Exam  Constitutional:  WD, WN, NAD HEENT:  NCAT, EOMI Neuro/Psych:  Alert &  oriented to person, place, and time; appropriate mood & affect Lymphatic: No generalized UE edema or lymphadenopathy Extremities / MSK:  Both UE are normal with respect to appearance, ranges of motion, joint stability, muscle strength/tone, sensation, & perfusion except as otherwise noted:  There is no deformity, discoloration, or swelling about the left shoulder.  Active range of motion is limited in most planes to below 90 and is weak with manual muscle testing specifically in external and internal rotation as well as empty can.  There is no pain about the acromioclavicular joint with compression with palpation.  The patient is NVI.   Labs / Xrays:  No radiographic studies obtained today.  MRI results from 01/06/2016 were reviewed and demonstrate a full-thickness tear of the rotator cuff.  Please see the report for further information.  Assessment: Left rotator cuff tear  Plan:  The findings were discussed with the patient in addition to the nurse case manager and the patient's wife.  We will proceed with left-sided rotator cuff repair, likely with subacromial decompression/acromioplasty.  We will plan not to address the a.c. joint in any way unless intraoperative findings dictate otherwise.  The details of the operative procedure were discussed with the patient.  Questions were invited and answered.  In addition to the goal of the procedure, the risks of the procedure to include but not limited to bleeding; infection; damage to  the nerves or blood vessels that could result in bleeding, numbness, weakness, chronic pain, and the need for additional procedures; stiffness; the need for revision surgery; and anesthetic risks were reviewed.  No specific outcome was guaranteed or implied.  Informed consent was obtained.   Kyrielle Urbanski A., MD 01/14/2016, 5:14 PM

## 2016-01-15 ENCOUNTER — Other Ambulatory Visit: Payer: Self-pay

## 2016-01-15 ENCOUNTER — Encounter (HOSPITAL_COMMUNITY)
Admission: RE | Admit: 2016-01-15 | Discharge: 2016-01-15 | Disposition: A | Discharge status: Home / Self Care | Payer: Worker's Compensation | Source: Ambulatory Visit | Attending: Orthopedic Surgery | Admitting: Orthopedic Surgery

## 2016-01-15 ENCOUNTER — Encounter (HOSPITAL_BASED_OUTPATIENT_CLINIC_OR_DEPARTMENT_OTHER): Payer: Self-pay | Admitting: *Deleted

## 2016-01-15 DIAGNOSIS — F17210 Nicotine dependence, cigarettes, uncomplicated: Secondary | ICD-10-CM | POA: Diagnosis not present

## 2016-01-15 DIAGNOSIS — W171XXA Fall into storm drain or manhole, initial encounter: Secondary | ICD-10-CM | POA: Diagnosis not present

## 2016-01-15 DIAGNOSIS — Y990 Civilian activity done for income or pay: Secondary | ICD-10-CM | POA: Diagnosis not present

## 2016-01-15 DIAGNOSIS — I422 Other hypertrophic cardiomyopathy: Secondary | ICD-10-CM | POA: Diagnosis not present

## 2016-01-15 DIAGNOSIS — I4510 Unspecified right bundle-branch block: Secondary | ICD-10-CM | POA: Diagnosis not present

## 2016-01-15 DIAGNOSIS — I10 Essential (primary) hypertension: Secondary | ICD-10-CM | POA: Diagnosis not present

## 2016-01-15 DIAGNOSIS — S46012A Strain of muscle(s) and tendon(s) of the rotator cuff of left shoulder, initial encounter: Secondary | ICD-10-CM | POA: Diagnosis not present

## 2016-01-15 LAB — BASIC METABOLIC PANEL
Anion gap: 7 (ref 5–15)
BUN: 21 mg/dL — AB (ref 6–20)
CALCIUM: 8.7 mg/dL — AB (ref 8.9–10.3)
CO2: 24 mmol/L (ref 22–32)
CREATININE: 0.81 mg/dL (ref 0.61–1.24)
Chloride: 106 mmol/L (ref 101–111)
GFR calc Af Amer: >60 mL/min (ref >60)
GFR calc non Af Amer: >60 mL/min (ref >60)
GLUCOSE: 118 mg/dL — AB (ref 65–99)
Potassium: 3.4 mmol/L — ABNORMAL LOW (ref 3.5–5.1)
Sodium: 137 mmol/L (ref 135–145)

## 2016-01-18 ENCOUNTER — Ambulatory Visit (HOSPITAL_BASED_OUTPATIENT_CLINIC_OR_DEPARTMENT_OTHER): Payer: Worker's Compensation | Admitting: Certified Registered"

## 2016-01-18 ENCOUNTER — Encounter (HOSPITAL_BASED_OUTPATIENT_CLINIC_OR_DEPARTMENT_OTHER): Payer: Self-pay

## 2016-01-18 ENCOUNTER — Encounter (HOSPITAL_BASED_OUTPATIENT_CLINIC_OR_DEPARTMENT_OTHER)
Admission: RE | Disposition: A | Discharge status: Home / Self Care | Payer: Self-pay | Source: Ambulatory Visit | Attending: Orthopedic Surgery

## 2016-01-18 ENCOUNTER — Ambulatory Visit (HOSPITAL_BASED_OUTPATIENT_CLINIC_OR_DEPARTMENT_OTHER)
Admission: RE | Admit: 2016-01-18 | Discharge: 2016-01-18 | Disposition: A | Discharge status: Home / Self Care | Payer: Worker's Compensation | Source: Ambulatory Visit | Attending: Orthopedic Surgery | Admitting: Orthopedic Surgery

## 2016-01-18 ENCOUNTER — Other Ambulatory Visit: Payer: Self-pay | Admitting: Family

## 2016-01-18 DIAGNOSIS — F17210 Nicotine dependence, cigarettes, uncomplicated: Secondary | ICD-10-CM | POA: Diagnosis not present

## 2016-01-18 DIAGNOSIS — I10 Essential (primary) hypertension: Secondary | ICD-10-CM | POA: Diagnosis not present

## 2016-01-18 DIAGNOSIS — S46012A Strain of muscle(s) and tendon(s) of the rotator cuff of left shoulder, initial encounter: Secondary | ICD-10-CM | POA: Insufficient documentation

## 2016-01-18 DIAGNOSIS — I422 Other hypertrophic cardiomyopathy: Secondary | ICD-10-CM | POA: Diagnosis not present

## 2016-01-18 DIAGNOSIS — W171XXA Fall into storm drain or manhole, initial encounter: Secondary | ICD-10-CM | POA: Insufficient documentation

## 2016-01-18 DIAGNOSIS — Y990 Civilian activity done for income or pay: Secondary | ICD-10-CM | POA: Insufficient documentation

## 2016-01-18 DIAGNOSIS — I4510 Unspecified right bundle-branch block: Secondary | ICD-10-CM | POA: Insufficient documentation

## 2016-01-18 HISTORY — PX: SHOULDER ARTHROSCOPY WITH ROTATOR CUFF REPAIR AND SUBACROMIAL DECOMPRESSION: SHX5686

## 2016-01-18 HISTORY — PX: SHOULDER ACROMIOPLASTY: SHX6093

## 2016-01-18 HISTORY — DX: Benign prostatic hyperplasia without lower urinary tract symptoms: N40.0

## 2016-01-18 SURGERY — SHOULDER ARTHROSCOPY WITH ROTATOR CUFF REPAIR AND SUBACROMIAL DECOMPRESSION
Anesthesia: General | Site: Shoulder | Laterality: Left

## 2016-01-18 MED ORDER — OXYCODONE HCL 5 MG PO TABS: 5.0000 mg | ORAL_TABLET | Freq: Once | ORAL | Status: DC | PRN

## 2016-01-18 MED ORDER — MIDAZOLAM HCL 2 MG/2ML IJ SOLN
INTRAMUSCULAR | Status: AC
  Filled 2016-01-18: qty 2

## 2016-01-18 MED ORDER — BUPIVACAINE-EPINEPHRINE (PF) 0.5% -1:200000 IJ SOLN
INTRAMUSCULAR | Status: DC | PRN
  Administered 2016-01-18: 25 mL via PERINEURAL

## 2016-01-18 MED ORDER — PROPOFOL 10 MG/ML IV BOLUS
INTRAVENOUS | Status: DC | PRN
  Administered 2016-01-18: 200 mg via INTRAVENOUS

## 2016-01-18 MED ORDER — DEXAMETHASONE SODIUM PHOSPHATE 10 MG/ML IJ SOLN
INTRAMUSCULAR | Status: AC
Start: 2016-01-18 — End: 2016-01-18
  Filled 2016-01-18: qty 1

## 2016-01-18 MED ORDER — MIDAZOLAM HCL 2 MG/2ML IJ SOLN
1.0000 mg | INTRAMUSCULAR | Status: DC | PRN
  Administered 2016-01-18: 1 mg via INTRAVENOUS
  Administered 2016-01-18: 2 mg via INTRAVENOUS

## 2016-01-18 MED ORDER — GLYCOPYRROLATE 0.2 MG/ML IJ SOLN: 0.2000 mg | Freq: Once | INTRAMUSCULAR | Status: DC | PRN

## 2016-01-18 MED ORDER — ONDANSETRON HCL 4 MG/2ML IJ SOLN
INTRAMUSCULAR | Status: DC | PRN
  Administered 2016-01-18: 4 mg via INTRAVENOUS

## 2016-01-18 MED ORDER — SUCCINYLCHOLINE CHLORIDE 20 MG/ML IJ SOLN
INTRAMUSCULAR | Status: DC | PRN
  Administered 2016-01-18: 100 mg via INTRAVENOUS

## 2016-01-18 MED ORDER — LIDOCAINE HCL (CARDIAC) 20 MG/ML IV SOLN
INTRAVENOUS | Status: DC | PRN
  Administered 2016-01-18: 30 mg via INTRAVENOUS

## 2016-01-18 MED ORDER — CEFAZOLIN SODIUM-DEXTROSE 2-3 GM-% IV SOLR
INTRAVENOUS | Status: AC
  Filled 2016-01-18: qty 50

## 2016-01-18 MED ORDER — FENTANYL CITRATE (PF) 100 MCG/2ML IJ SOLN
INTRAMUSCULAR | Status: AC
  Filled 2016-01-18: qty 2

## 2016-01-18 MED ORDER — HYDROMORPHONE HCL 1 MG/ML IJ SOLN: 0.2500 mg | INTRAMUSCULAR | Status: DC | PRN

## 2016-01-18 MED ORDER — SUCCINYLCHOLINE CHLORIDE 20 MG/ML IJ SOLN
INTRAMUSCULAR | Status: AC
  Filled 2016-01-18: qty 1

## 2016-01-18 MED ORDER — SODIUM CHLORIDE 0.9 % IR SOLN
Status: DC | PRN
  Administered 2016-01-18: 30000 mL

## 2016-01-18 MED ORDER — LIDOCAINE HCL (CARDIAC) 20 MG/ML IV SOLN
INTRAVENOUS | Status: AC
  Filled 2016-01-18: qty 5

## 2016-01-18 MED ORDER — OXYCODONE HCL 5 MG/5ML PO SOLN: 5.0000 mg | Freq: Once | ORAL | Status: DC | PRN

## 2016-01-18 MED ORDER — SCOPOLAMINE 1 MG/3DAYS TD PT72: 1.0000 | MEDICATED_PATCH | Freq: Once | TRANSDERMAL | Status: DC | PRN

## 2016-01-18 MED ORDER — MEPERIDINE HCL 25 MG/ML IJ SOLN: 6.2500 mg | INTRAMUSCULAR | Status: DC | PRN

## 2016-01-18 MED ORDER — LACTATED RINGERS IV SOLN
INTRAVENOUS | Status: DC
  Administered 2016-01-18 (×2): via INTRAVENOUS

## 2016-01-18 MED ORDER — CEFAZOLIN SODIUM-DEXTROSE 2-3 GM-% IV SOLR
2.0000 g | INTRAVENOUS | Status: AC
  Administered 2016-01-18: 2 g via INTRAVENOUS

## 2016-01-18 MED ORDER — EPINEPHRINE HCL 1 MG/ML IJ SOLN
INTRAMUSCULAR | Status: AC
  Filled 2016-01-18: qty 1

## 2016-01-18 MED ORDER — ONDANSETRON HCL 4 MG/2ML IJ SOLN
INTRAMUSCULAR | Status: AC
Start: 2016-01-18 — End: 2016-01-18
  Filled 2016-01-18: qty 2

## 2016-01-18 MED ORDER — LACTATED RINGERS IV SOLN: INTRAVENOUS | Status: DC

## 2016-01-18 MED ORDER — DEXAMETHASONE SODIUM PHOSPHATE 4 MG/ML IJ SOLN
INTRAMUSCULAR | Status: DC | PRN
  Administered 2016-01-18: 10 mg via INTRAVENOUS

## 2016-01-18 MED ORDER — PROPOFOL 500 MG/50ML IV EMUL
INTRAVENOUS | Status: AC
Start: 2016-01-18 — End: 2016-01-18
  Filled 2016-01-18: qty 50

## 2016-01-18 MED ORDER — FENTANYL CITRATE (PF) 100 MCG/2ML IJ SOLN
50.0000 ug | INTRAMUSCULAR | Status: DC | PRN
  Administered 2016-01-18: 100 ug via INTRAVENOUS
  Administered 2016-01-18: 50 ug via INTRAVENOUS

## 2016-01-18 SURGICAL SUPPLY — 81 items
ANCH SUT KNTLS STRL SHLDR SYS (Anchor) ×9 IMPLANT
ANCHOR SUT QUATTRO KNTLS 4.5 (Anchor) ×9 IMPLANT
APL SKNCLS STERI-STRIP NONHPOA (GAUZE/BANDAGES/DRESSINGS)
BENZOIN TINCTURE PRP APPL 2/3 (GAUZE/BANDAGES/DRESSINGS) IMPLANT
BLADE AVERAGE 25X9 (BLADE) IMPLANT
BLADE CUTTER GATOR 3.5 (BLADE) IMPLANT
BLADE GREAT WHITE 4.2 (BLADE) ×2 IMPLANT
BLADE SURG 15 STRL LF DISP TIS (BLADE) IMPLANT
BLADE SURG 15 STRL SS (BLADE)
BUR OVAL 4.0 (BURR) ×1 IMPLANT
BUR OVAL 6.0 (BURR) IMPLANT
CANNULA 5.75X71 LONG (CANNULA) IMPLANT
CANNULA TWIST IN 8.25X7CM (CANNULA) ×2 IMPLANT
CLEANER CAUTERY TIP 5X5 PAD (MISCELLANEOUS) IMPLANT
DECANTER SPIKE VIAL GLASS SM (MISCELLANEOUS) IMPLANT
DRAPE IMP U-DRAPE 54X76 (DRAPES) ×2 IMPLANT
DRAPE STERI 35X30 U-POUCH (DRAPES) ×2 IMPLANT
DRAPE SURG 17X23 STRL (DRAPES) ×2 IMPLANT
DRAPE U-SHAPE 47X51 STRL (DRAPES) ×2 IMPLANT
DRAPE U-SHAPE 76X120 STRL (DRAPES) ×4 IMPLANT
DRSG ADAPTIC 3X8 NADH LF (GAUZE/BANDAGES/DRESSINGS) ×1 IMPLANT
DRSG EMULSION OIL 3X3 NADH (GAUZE/BANDAGES/DRESSINGS) IMPLANT
DURAPREP 26ML APPLICATOR (WOUND CARE) ×3 IMPLANT
ELECT REM PT RETURN 9FT ADLT (ELECTROSURGICAL) ×2
ELECTRODE REM PT RTRN 9FT ADLT (ELECTROSURGICAL) ×1 IMPLANT
GAUZE SPONGE 4X4 12PLY STRL (GAUZE/BANDAGES/DRESSINGS) ×2 IMPLANT
GLOVE BIO SURGEON STRL SZ7.5 (GLOVE) ×2 IMPLANT
GLOVE BIOGEL PI IND STRL 7.0 (GLOVE) ×1 IMPLANT
GLOVE BIOGEL PI IND STRL 8 (GLOVE) ×1 IMPLANT
GLOVE BIOGEL PI INDICATOR 7.0 (GLOVE) ×3
GLOVE BIOGEL PI INDICATOR 8 (GLOVE) ×1
GLOVE ECLIPSE 6.5 STRL STRAW (GLOVE) ×3 IMPLANT
GOWN STRL REUS W/ TWL LRG LVL3 (GOWN DISPOSABLE) ×2 IMPLANT
GOWN STRL REUS W/TWL LRG LVL3 (GOWN DISPOSABLE) ×4
GOWN STRL REUS W/TWL XL LVL3 (GOWN DISPOSABLE) ×2 IMPLANT
IV NS IRRIG 3000ML ARTHROMATIC (IV SOLUTION) ×12 IMPLANT
LIQUID BAND (GAUZE/BANDAGES/DRESSINGS) IMPLANT
MANIFOLD NEPTUNE II (INSTRUMENTS) ×2 IMPLANT
NDL SCORPION MULTI FIRE (NEEDLE) IMPLANT
NDL SUT 6 .5 CRC .975X.05 MAYO (NEEDLE) IMPLANT
NEEDLE MAYO TAPER (NEEDLE)
NEEDLE SCORPION MULTI FIRE (NEEDLE) ×2 IMPLANT
PACK ARTHROSCOPY DSU (CUSTOM PROCEDURE TRAY) ×2 IMPLANT
PACK BASIN DAY SURGERY FS (CUSTOM PROCEDURE TRAY) ×2 IMPLANT
PAD CLEANER CAUTERY TIP 5X5 (MISCELLANEOUS)
PAD ORTHO SHOULDER 7X19 LRG (SOFTGOODS) IMPLANT
PENCIL BUTTON HOLSTER BLD 10FT (ELECTRODE) IMPLANT
PUSHER SUTURE BIPASS DISP (NEEDLE) ×1 IMPLANT
RETRIEVER SUT HEWSON (MISCELLANEOUS) IMPLANT
SET ARTHROSCOPY TUBING (MISCELLANEOUS) ×2
SET ARTHROSCOPY TUBING LN (MISCELLANEOUS) ×1 IMPLANT
SLING ARM FOAM STRAP LRG (SOFTGOODS) IMPLANT
SLING ARM MED ADULT FOAM STRAP (SOFTGOODS) IMPLANT
SLING ARM SM FOAM STRAP (SOFTGOODS) IMPLANT
SLING ARM XL FOAM STRAP (SOFTGOODS) IMPLANT
SLING ULTRA III MED (ORTHOPEDIC SUPPLIES) IMPLANT
SPONGE LAP 4X18 X RAY DECT (DISPOSABLE) IMPLANT
STAPLER VISISTAT 35W (STAPLE) ×2 IMPLANT
STRIP CLOSURE SKIN 1/2X4 (GAUZE/BANDAGES/DRESSINGS) IMPLANT
SUCTION FRAZIER HANDLE 10FR (MISCELLANEOUS)
SUCTION TUBE FRAZIER 10FR DISP (MISCELLANEOUS) IMPLANT
SUPPORT WRAP ARM LG (MISCELLANEOUS) ×1 IMPLANT
SUT ETHIBOND 2 OS 4 DA (SUTURE) IMPLANT
SUT ETHILON 3 0 PS 1 (SUTURE) IMPLANT
SUT FIBERWIRE #2 38 T-5 BLUE (SUTURE) ×4
SUT TIGER TAPE 7 IN WHITE (SUTURE) ×2 IMPLANT
SUT VIC AB 0 SH 27 (SUTURE) IMPLANT
SUT VIC AB 2-0 CT3 27 (SUTURE) IMPLANT
SUT VIC AB 2-0 SH 27 (SUTURE)
SUT VIC AB 2-0 SH 27XBRD (SUTURE) IMPLANT
SUT VICRYL 4-0 PS2 18IN ABS (SUTURE) IMPLANT
SUT VICRYL RAPIDE 4-0 (SUTURE) IMPLANT
SUT VICRYL RAPIDE 4/0 PS 2 (SUTURE) IMPLANT
SUTURE FIBERWR #2 38 T-5 BLUE (SUTURE) IMPLANT
SYR BULB 3OZ (MISCELLANEOUS) IMPLANT
TAPE FIBER 2MM 7IN #2 BLUE (SUTURE) ×2 IMPLANT
TOWEL OR 17X24 6PK STRL BLUE (TOWEL DISPOSABLE) ×2 IMPLANT
TOWEL OR NON WOVEN STRL DISP B (DISPOSABLE) ×2 IMPLANT
TUBE CONNECTING 20X1/4 (TUBING) ×2 IMPLANT
WAND STAR VAC 90 (SURGICAL WAND) ×2 IMPLANT
YANKAUER SUCT BULB TIP NO VENT (SUCTIONS) IMPLANT

## 2016-01-18 NOTE — Op Note (Signed)
01/18/2016  9:20 AM  PATIENT:  [name] Foster  60 y.o. male  PRE-OPERATIVE DIAGNOSIS:  Large left rotator cuff tear  POST-OPERATIVE DIAGNOSIS:  Same  PROCEDURE:  Left shoulder arthroscopic repair of large left rotator cuff tear with subacromial decompression/Acromioplasty  SURGEON: Rayvon Char. Grandville Silos, MD  PHYSICIAN ASSISTANT: Morley Kos, OPA-C  ANESTHESIA:  regional and general  SPECIMENS:  None  DRAINS:   None  EBL:  less than 50 mL  PREOPERATIVE INDICATIONS:  [name] Foster is a  60 y.o. male with an acute large left rotator cuff tear of supraspinatus and infraspinatus  The risks benefits and alternatives were discussed with the patient preoperatively including but not limited to the risks of infection, bleeding, nerve injury, cardiopulmonary complications, the need for revision surgery, among others, and the patient verbalized understanding and consented to proceed.  OPERATIVE IMPLANTS: Biomet Quattro anchors 7, 4 medially and 3 laterally  OPERATIVE PROCEDURE:  After receiving prophylactic antibiotics and a regional block, the patient was escorted to the operative theatre and placed in a supine position.  General anesthesia was administered.  A surgical "time-out" was performed during which the planned procedure, proposed operative site, and the correct patient identity were compared to the operative consent and agreement confirmed by the circulating nurse according to current facility policy.  The right upper extremity was prepped with DuraPrep and draped in usual sterile fashion.  The landmarks were drawn externally, the planned incisions infiltrated with epinephrine solution for hemostasis, and standard posterior portal established.  Arthroscopy of the joint commenced.  Standard anterior portal was established after needle localization.  The biceps tendon was intact.  Subscap was intact, glenohumeral joint surfaces were in reasonably good condition.  There was some fraying of the  anterior labrum which was debrided.  The bicipital root was inspected and found to be well adherent to the glenoid.  A large rotator cuff tear was identified consisting of the supraspinatus and infraspinatus.  The scope was then moved to the subacromial space where ultimately 3 additional lateral portals were established, working through these 3 portals, the cuff was further identified.  A suture was placed within it was pulled forward, revealing that it would come back to its native footprint reasonably well.  The footprint was cleaned of debris with soft tissue debridement using suction shaver and ArthroCare wand, followed by the bur down to punctate bleeding bone..  Subacromial space was similarly treated to aid in visualization.  4 different anchors were placed medially, bringing the suture tails up through them 4 different places.  This allowed for ultimately 3 lateral anchors to replaced as a double row repair of this large cuff tear.  It approximated the footprint of the tendon back to its native position on the greater tuberosity.  Acromioplasty was then performed with the bur and the instrument removed.  Portals were closed with staples.  Dressing was applied followed by placing the arm into an abduction pillow sling and he was awakened and taken to room stable condition, breathing spontaneously.  DISPOSITION: He'll be discharged home today with typical instructions, returning in 10-15 days, at which time we can start formal therapy after getting it approved through [employer]

## 2016-01-18 NOTE — Anesthesia Postprocedure Evaluation (Signed)
Anesthesia Post Note  Patient: [name] Foster  Procedure(s) Performed: Procedure(s) (LRB): LEFT SHOULDER ARTHROSCOPY WITH ROTATOR CUFF REPAIR AND SUBACROMIAL DECOMPRESSION (Left) SHOULDER ACROMIOPLASTY (Left)  Patient location during evaluation: PACU Anesthesia Type: General Level of consciousness: awake and alert Pain management: pain level controlled Vital Signs Assessment: post-procedure vital signs reviewed and stable Respiratory status: spontaneous breathing, nonlabored ventilation and respiratory function stable Cardiovascular status: blood pressure returned to baseline and stable Postop Assessment: no signs of nausea or vomiting Anesthetic complications: no    Last Vitals:  Filed Vitals:   01/18/16 1400 01/18/16 1445  BP: 125/81 128/79  Pulse: 84 84  Temp:    Resp: 12 16    Last Pain:  Filed Vitals:   01/18/16 1448  PainSc: 0-No pain                 Rayni Nemitz A

## 2016-01-18 NOTE — Progress Notes (Signed)
Assisted Dr. Crews with left, ultrasound guided, interscalene  block. Side rails up, monitors on throughout procedure. See vital signs in flow sheet. Tolerated Procedure well. 

## 2016-01-18 NOTE — Anesthesia Preprocedure Evaluation (Addendum)
Anesthesia Evaluation  Patient identified by MRN, date of birth, ID band Patient awake    Reviewed: Allergy & Precautions, NPO status , Patient's Chart, lab work & pertinent test results  Airway Mallampati: I  TM Distance: >3 FB Neck ROM: Full    Dental  (+) Teeth Intact, Dental Advisory Given   Pulmonary Current Smoker,    breath sounds clear to auscultation       Cardiovascular hypertension, Pt. on medications  Rhythm:Regular Rate:Normal     Neuro/Psych    GI/Hepatic   Endo/Other    Renal/GU      Musculoskeletal   Abdominal   Peds  Hematology   Anesthesia Other Findings   Reproductive/Obstetrics                            Anesthesia Physical Anesthesia Plan  ASA: II  Anesthesia Plan: General   Post-op Pain Management: GA combined w/ Regional for post-op pain   Induction: Intravenous  Airway Management Planned: Oral ETT  Additional Equipment:   Intra-op Plan:   Post-operative Plan: Extubation in OR  Informed Consent: I have reviewed the patients History and Physical, chart, labs and discussed the procedure including the risks, benefits and alternatives for the proposed anesthesia with the patient or authorized representative who has indicated his/her understanding and acceptance.   Dental advisory given  Plan Discussed with: CRNA, Anesthesiologist and Surgeon  Anesthesia Plan Comments:         Anesthesia Quick Evaluation

## 2016-01-18 NOTE — Interval H&P Note (Signed)
History and Physical Interval Note:  01/18/2016 9:19 AM  [name] Foster  has presented today for surgery, with the diagnosis of LEFT SHOULDER ROTATOR CUFF TEAR WITH SUBACROMIAL BURSITIS M75.122, M75.42  The various methods of treatment have been discussed with the patient and family. After consideration of risks, benefits and other options for treatment, the patient has consented to  Procedure(s) with comments: LEFT SHOULDER ARTHROSCOPY WITH ROTATOR CUFF REPAIR AND SUBACROMIAL DECOMPRESSION (Left) - PRE-OP BLOCK WITH GENERAL ANESTHESIA as a surgical intervention .  The patient's history has been reviewed, patient examined, no change in status, stable for surgery.  I have reviewed the patient's chart and labs.  Questions were answered to the patient's satisfaction.     Amisha Pospisil A.

## 2016-01-18 NOTE — Discharge Instructions (Signed)
Discharge Instructions   You have a light dressing on your shoulder.  Wear the sling all of the time except for showering.  You may begin gentle motion of your fingers and hand immediately as well as your elbow, but you should not do any heavy lifting or gripping or shoulder motion.  Elevate your hand in the sling to reduce pain & swelling of the digits.  Ice over the operative site may be helpful to reduce pain & swelling.  DO NOT USE HEAT. Pain medicine has been prescribed for you.  Use your medicine as needed over the first 48 hours, and then you can begin to taper your use. You may use Tylenol in place of your prescribed pain medication, but not IN ADDITION to it. Leave the dressing in place until the third day after your surgery and then remove it, leaving it open to air.  After the bandage has been removed you may shower, regularly washing the incision and letting the water run over it, but not submerging it (no swimming, soaking it in dishwater, etc.) You may drive a car when you are off of prescription pain medications and can safely control your vehicle with both hands. We will address whether therapy will be required or not when you return to the office. You may have already made your follow-up appointment when we completed your preop visit.  If not, please call our office today or the next business day to make your return appointment for 10-15 days after surgery.   Please call [phone] during normal business hours or [phone] after hours for any problems. Including the following:  - excessive redness of the incisions - drainage for more than 4 days - fever of more than 101.5 F  *Please note that pain medications will not be refilled after hours or on weekends.  Regional Anesthesia Blocks  1. Numbness or the inability to move the "blocked" extremity may last from 3-48 hours after placement. The length of time depends on the medication injected and your individual response  to the medication. If the numbness is not going away after 48 hours, call your surgeon.  2. The extremity that is blocked will need to be protected until the numbness is gone and the  Strength has returned. Because you cannot feel it, you will need to take extra care to avoid injury. Because it may be weak, you may have difficulty moving it or using it. You may not know what position it is in without looking at it while the block is in effect.  3. For blocks in the legs and feet, returning to weight bearing and walking needs to be done carefully. You will need to wait until the numbness is entirely gone and the strength has returned. You should be able to move your leg and foot normally before you try and bear weight or walk. You will need someone to be with you when you first try to ensure you do not fall and possibly risk injury.  4. Bruising and tenderness at the needle site are common side effects and will resolve in a few days.  5. Persistent numbness or new problems with movement should be communicated to the surgeon or the [facility] [phone] [place] [phone]).  Post Anesthesia Home Care Instructions  Activity: Get plenty of rest for the remainder of the day. A responsible adult should stay with you for 24 hours following the procedure.  For the next 24 hours, DO NOT: -  Drive a car -Paediatric nurse -Drink alcoholic beverages -Take any medication unless instructed by your physician -Make any legal decisions or sign important papers.  Meals: Start with liquid foods such as gelatin or soup. Progress to regular foods as tolerated. Avoid greasy, spicy, heavy foods. If nausea and/or vomiting occur, drink only clear liquids until the nausea and/or vomiting subsides. Call your physician if vomiting continues.  Special Instructions/Symptoms: Your throat may feel dry or sore from the anesthesia or the breathing tube placed in your throat during  surgery. If this causes discomfort, gargle with warm salt water. The discomfort should disappear within 24 hours.  If you had a scopolamine patch placed behind your ear for the management of post- operative nausea and/or vomiting:  1. The medication in the patch is effective for 72 hours, after which it should be removed.  Wrap patch in a tissue and discard in the trash. Wash hands thoroughly with soap and water. 2. You may remove the patch earlier than 72 hours if you experience unpleasant side effects which may include dry mouth, dizziness or visual disturbances. 3. Avoid touching the patch. Wash your hands with soap and water after contact with the patch.

## 2016-01-18 NOTE — Anesthesia Procedure Notes (Addendum)
Anesthesia Regional Block:  Interscalene brachial plexus block  Pre-Anesthetic Checklist: ,, timeout performed, Correct Patient, Correct Site, Correct Laterality, Correct Procedure, Correct Position, site marked, Risks and benefits discussed,  Surgical consent,  Pre-op evaluation,  At surgeon's request and post-op pain management  Laterality: Left and Upper  Prep: chloraprep       Needles:  Injection technique: Single-shot  Needle Type: Echogenic Needle     Needle Length: 5cm 5 cm Needle Gauge: 21 and 21 G    Additional Needles:  Procedures: ultrasound guided (picture in chart) Interscalene brachial plexus block Narrative:  Start time: 01/18/2016 8:44 AM End time: 01/18/2016 8:49 AM Injection made incrementally with aspirations every 5 mL.  Performed by: Personally  Anesthesiologist: CREWS, DAVID   Procedure Name: Intubation Date/Time: 01/18/2016 9:31 AM Performed by: Manda Holstad D Pre-anesthesia Checklist: Patient identified, Emergency Drugs available, Suction available and Patient being monitored Patient Re-evaluated:Patient Re-evaluated prior to inductionOxygen Delivery Method: Circle System Utilized Preoxygenation: Pre-oxygenation with 100% oxygen Intubation Type: IV induction Ventilation: Mask ventilation without difficulty Laryngoscope Size: Mac and 3 Grade View: Grade I Tube type: Oral Tube size: 7.0 mm Number of attempts: 1 Airway Equipment and Method: Stylet and Oral airway Placement Confirmation: ETT inserted through vocal cords under direct vision,  positive ETCO2 and breath sounds checked- equal and bilateral Secured at: 21 cm Tube secured with: Tape Dental Injury: Teeth and Oropharynx as per pre-operative assessment       Left IS block image

## 2016-01-18 NOTE — Transfer of Care (Signed)
Immediate Anesthesia Transfer of Care Note  Patient: Jasion Tuccio  Procedure(s) Performed: Procedure(s) with comments: LEFT SHOULDER ARTHROSCOPY WITH ROTATOR CUFF REPAIR AND SUBACROMIAL DECOMPRESSION (Left) - PRE-OP BLOCK WITH GENERAL ANESTHESIA SHOULDER ACROMIOPLASTY (Left)  Patient Location: PACU  Anesthesia Type:GA combined with regional for post-op pain  Level of Consciousness: awake and patient cooperative  Airway & Oxygen Therapy: Patient Spontanous Breathing and Patient connected to face mask oxygen  Post-op Assessment: Report given to RN and Post -op Vital signs reviewed and stable  Post vital signs: Reviewed and stable  Last Vitals:  Filed Vitals:   01/18/16 0845 01/18/16 0850  BP: 123/83   Pulse: 72 71  Temp:    Resp: 17 13    Complications: No apparent anesthesia complications

## 2016-01-19 ENCOUNTER — Encounter (HOSPITAL_BASED_OUTPATIENT_CLINIC_OR_DEPARTMENT_OTHER): Payer: Self-pay | Admitting: Orthopedic Surgery

## 2016-01-28 ENCOUNTER — Other Ambulatory Visit: Payer: Self-pay | Admitting: Family Medicine

## 2016-02-17 ENCOUNTER — Ambulatory Visit (HOSPITAL_COMMUNITY): Payer: Worker's Compensation

## 2016-02-17 ENCOUNTER — Ambulatory Visit (HOSPITAL_COMMUNITY): Payer: Worker's Compensation | Attending: Orthopedic Surgery | Admitting: Physical Therapy

## 2016-02-17 DIAGNOSIS — R293 Abnormal posture: Secondary | ICD-10-CM | POA: Insufficient documentation

## 2016-02-17 DIAGNOSIS — M25512 Pain in left shoulder: Secondary | ICD-10-CM

## 2016-02-17 DIAGNOSIS — M25612 Stiffness of left shoulder, not elsewhere classified: Secondary | ICD-10-CM

## 2016-02-17 DIAGNOSIS — M6281 Muscle weakness (generalized): Secondary | ICD-10-CM

## 2016-02-17 NOTE — Therapy (Addendum)
[place], [place], [address] Phone: [phone]   Fax:  [phone]  Physical Therapy Evaluation  Patient Details  Name: [name] Foster MRN: [id number] Date of Birth: [birth year] Referring Provider: Jolyn Nap   Encounter Date: 02/17/2016      PT End of Session - 02/17/16 1535    Visit Number 1   Number of Visits 24  12 pre-approved by workers comp; requesting 24    Date for PT Re-Evaluation 03/16/16   Authorization Type Workman's Comp (12 pre-approved as of 02/17/16, working on requesting more due to patient status)   Authorization Time Period 02/17/16 to 03/23/16   Authorization - Visit Number 1   Authorization - Number of Visits 12   PT Start Time [id number]   PT Stop Time 1525   PT Time Calculation (min) 53 min   Activity Tolerance Patient limited by pain;No increased pain   Behavior During Therapy [facility] for tasks assessed/performed      Past Medical History  Diagnosis Date  . Benign essential hypertension   . Abnormal EKG   . Tobacco abuse   . Apical variant hypertrophic cardiomyopathy ([place])   . Hypokalemia   . BPH (benign prostatic hyperplasia)     Past Surgical History  Procedure Laterality Date  . Cardiac catheterization      2008  . Kidney surgery Right April 2015    benign tumor removal  . Colonoscopy N/A 02/26/2015    Procedure: COLONOSCOPY;  Surgeon: Rogene Houston, MD;  [facility];  Service: Endoscopy;  Laterality: N/A;  830  . Prostate ablation  12-2015 at [facility]  . Shoulder arthroscopy with rotator cuff repair and subacromial decompression Left 01/18/2016    Procedure: LEFT SHOULDER ARTHROSCOPY WITH ROTATOR CUFF REPAIR AND SUBACROMIAL DECOMPRESSION;  Surgeon: Milly Jakob, MD;  Location: [facility];  Service: Orthopedics;  Laterality: Left;  PRE-OP BLOCK WITH GENERAL ANESTHESIA  . Shoulder acromioplasty Left 01/18/2016    Procedure: SHOULDER ACROMIOPLASTY;  Surgeon:  Milly Jakob, MD;  Location: [facility];  Service: Orthopedics;  Laterality: Left;    There were no vitals filed for this visit.       Subjective Assessment - 02/17/16 1436    Subjective Patient fell down inside of a manhole in late February; fell down to the body and L arm got caught on rim of manhole, and got caught back behind him and internally rotated. He went to [place] and was put on light duty; ended up getting an MRI about 2.5 weeks after accident. AFter this he went into surgery on March 20th (per time stamp on EPIC notes). MD said not to do anything until PT starts.    Pertinent History HTN, tobacco abuse, hx of cardiac cath    Patient Stated Goals get back to job activites (very active)   Currently in Pain? Yes   Pain Score 7    Pain Location Shoulder   Pain Orientation Left   Pain Descriptors / Indicators Sharp;Shooting   Pain Type Surgical pain   Pain Radiating Towards neck and down your arm    Pain Onset 1 to 4 weeks ago   Pain Frequency Constant   Aggravating Factors  moving and activity in general    Pain Relieving Factors pain medicine    Effect of Pain on Daily Activities cannot perform job based tasks             [facility] PT  Assessment - 02/17/16 0001    Assessment   Medical Diagnosis L rotator cuff repair    Referring Provider Jolyn Nap    Onset Date/Surgical Date 01/18/16   Next MD Visit May 3rd with Dr. [place]ville Silos    Precautions   Precautions Shoulder   Type of Shoulder Precautions Using Raliegh Ip 2015 protocol    Precaution Comments reports MD did not really give him any specific precaution   Balance Screen   Has the patient fallen in the past 6 months Yes   How many times? 1- fall that caused injury    Has the patient had a decrease in activity level because of a fear of falling?  Yes   Is the patient reluctant to leave their home because of a fear of falling?  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, dig, perform manual labor    Leisure no hobbies    Observation/Other Assessments   Focus on Therapeutic Outcomes (FOTO)  71% limited    AROM   Overall AROM Comments very stiff and pain limited    Left Shoulder Flexion --  approximately 20-50 degrees passively; pain limited    Left Shoulder ABduction 60 Degrees  pain limited passively   Left Shoulder Internal Rotation 75 Degrees  able to IR forearm to stomach passively   Left Shoulder External Rotation 8 Degrees passively                           PT Education - 02/17/16 1533    Education provided Yes   Education Details prognosis, plan of care, HEP; shoulder currently extremely limited due to no mobility over past month, shortening of tissues    Person(s) Educated Patient   Methods Explanation;Demonstration;Handout   Comprehension Verbalized understanding;Returned demonstration;Need further instruction          PT Short Term Goals - 02/17/16 1545    PT SHORT TERM GOAL #1   Title Patient to demonstrate at least 100 degrees of L shoulder flexion and abduction in order to assist in reducing pain and improving general function    Time 3   Period Weeks   Status New   PT SHORT TERM GOAL #2   Title Patient to demonstrate 90 degrees L shoulder IR and at least 45 degrees L shoulder ER in order to assist in reducing pain adn improve function    Time 3   Period Weeks   Status New   PT SHORT TERM GOAL #3   Title Patient to demonstrate symmetrical neuromuscular recrutiment of all scapular and shoulder girlde muscular to demosntrate improved function and neuromuscular recruitment for functional task performance    Time 3   Period Weeks   Status New   PT SHORT TERM GOAL #4   Title Patient to experience pain no more than 4/10 L  shoulder during all tasks in order to improve task performance and overall QOL    Time 3   Period Weeks   Status New   PT SHORT TERM GOAL #5   Title Patient to be independent in correctly and consistently performing appropriate HEP, to be updated PRN    Time 3   Period Weeks   Status New           PT Long Term  Goals - 02/17/16 1548    PT LONG TERM GOAL #1   Title Patient to demonstrate at least 160 degrees L shoulder flexion and ABD in order to improve function and assist in improving overall QOL    Time 6   Period Weeks   Status New   PT LONG TERM GOAL #2   Title Patient to demonstrate L shoulder IR of 90 degrees and L shoulder ER of at least 80 degrees in order to improve function and assist in improving overall QOL    Time 6   Period Weeks   Status New   PT LONG TERM GOAL #3   Title Patient to demonstrate strength at least 4/5 in all tested muscle groups in order to improve function and reduce pain, improve regional stability    Time 6   Period Weeks   Status New   PT LONG TERM GOAL #4   Title Patient to report he has been able to use both UEs to dress and wash his hair in order to demosntrate improved general function and ability to perform self-care tasks    Time 6   Period Weeks   Status New   PT LONG TERM GOAL #5   Title Patient to report he has been able to return to light duty at work in order to assist in returning to function and improved QOL    Time 6   Period Weeks   Status New               Plan - 02/17/16 1537    Clinical Impression Statement Patient arrives status post repair of L rotator cuff tear; initial injur occurred in late February 2017 when he fell into an open manhole, and per EPIC notes his surgery took place on 01/18/16. No protocol was included in MD packet, so used 2015 Murphy-Wainer rotator cuff repair protocol for evaluation. Upon examination, patient reveals severe ROM restrictions on all planes but internal rotation, noteable muscle  atrophy and difficulty properly recruiting muscle groups on L side of shoulder girdle complex, severe muscle weakness (noted per observation of functional status and movement patterns), significant L shoulder pain radiating up into neck, and  right now is unable to perform functional tasks or return to work due to the extent of his limitations. Patient reports taht he has not done any exercises or activities since surgery, and has primarily just been in his shoulder sling except for showers since the surgery was done. He will require extensive skilled rehabiltiation services to regain functional use of his L shoulder at this time. Workers comp, at the time of this evaluation, has only approved 12 visits, however recommend extended skilled PT services to address functional impairments, assist in returning to function, and to assist in reaching optimal level of function in general.      Rehab Potential Good   PT Frequency Other (comment)  3x/week for 2 weeks, then 2x/week for 3 weeks; BUT if 24 vistis approved, up to 3x/week!!!    PT Duration Other (comment)  5 weeks (will adjust if more visits approved)   PT Treatment/Interventions ADLs/Self Care Home Management;Cryotherapy;Moist Heat;Functional mobility training;Therapeutic activities;Therapeutic exercise;Neuromuscular re-education;Patient/family education;Manual techniques;Passive range of motion;Taping   PT Next Visit Plan review initial eval and goals, HEP; go per Raliegh Ip 2015 protocol for rotator cuff HOWEVER MD WANTS EACH STAGE EXTENDED BY 2 WEEKS BEFORE PROGRESSING DUE TO SIZE/NATURE OF TEAR; focus on mobility of shoulder and correct muscle recruitment of scapular stabilizers, posture,  pain reduction   PT Home Exercise Plan given    Consulted and Agree with Plan of Care Patient      Patient will benefit from skilled therapeutic intervention in order to improve the following deficits and impairments:  Hypomobility, Decreased knowledge of  precautions, Decreased strength, Increased fascial restricitons, Impaired UE functional use, Pain, Increased muscle spasms, Improper body mechanics, Decreased coordination, Impaired flexibility, Postural dysfunction  Visit Diagnosis: Stiffness of left shoulder, not elsewhere classified - Plan: PT plan of care cert/re-cert  Pain in left shoulder - Plan: PT plan of care cert/re-cert  Muscle weakness (generalized) - Plan: PT plan of care cert/re-cert  Abnormal posture - Plan: PT plan of care cert/re-cert     Problem List Patient Active Problem List   Diagnosis Date Noted  . Hypokalemia   . Preop cardiovascular exam 02/24/2014  . Apical variant hypertrophic cardiomyopathy ([place]) 02/24/2014  . Tobacco abuse 02/24/2014  . HYPERTENSION, BENIGN 12/12/2008  . CHEST PAIN-UNSPECIFIED 12/12/2008  . ABNORMAL EKG 12/12/2008    Deniece Ree PT, DPT [phone]  4/20- ADDENDUM PLAN. Deniece Ree PT, DPT [phone]   [facility] [address] [place], [place], [address] Phone: [phone]   Fax:  [phone]  Name: Jammy Lagrassa MRN: [id number] Date of Birth: [birth year]

## 2016-02-17 NOTE — Patient Instructions (Signed)
   PENDULUM SHOULDER CIRCLES  Shift your body weight in circles to allow your injured arm to swing in circles freely. Your injured arm should be fully relaxed.  Use your hips to swing the arm forward and backward, side to side, and in clockwise/counterclockwise for 2 minutes at a time (8 minutes total), 2-3 times per day. Your hips and body should be doing hte motion and your left arm COMPLETELY relaxed.      SHRUGS  Raise your shoulders upward towards your ears as shown. Shrug both shoulders at the same time.  Repeat 10 times, 2-3 times per day.    SCAPULAR RETRACTIONS  Draw your shoulder blades back and down. It should feel like you are pinching your shoulder blades together.  Repeat 10 times, 2-3 times per day.    BICEP CURLS ASSISTED ON LEFT   Use your right hand to support and bend your left elbow and move your forearm upwards; keep using your right arm to lower your forearm back down.  Do not try this with the left arm only- keep using your right arm to assist until we clear you int he clinic.  Repeat 5-10 times, twice a day.    Seated Passive Shoulder Flexion  Place both arms up on table/desk/counter with elbows straight and thumbs up as pictured. While seated on rolling stool or office chair, scoot back away from the table, allowing your shoulder to passively move into flexion.   Go to a point where you feel a good comfortable stretch, do not stretch to the point of pain.   You might feel a stretch just placing your left arm up on the table- this is OK! Just do this to your tolerance, do not push through pain. Hold this for 60 seconds at a time, 3 times in a row, 2-3 times per day.  SEATED PASSIVE SHOULDER ABDUCTION  Side in a chair with your kitchen table on your left. Put your left arm up on the table so it is directly to the side of you- you should feel a stretch. Do not push through pain, just feel a stretch. Hold for 30-60 seconds, 3 times, 2-3  times per day.

## 2016-02-24 ENCOUNTER — Ambulatory Visit (HOSPITAL_COMMUNITY): Payer: Worker's Compensation | Admitting: Physical Therapy

## 2016-02-24 DIAGNOSIS — M6281 Muscle weakness (generalized): Secondary | ICD-10-CM

## 2016-02-24 DIAGNOSIS — M25612 Stiffness of left shoulder, not elsewhere classified: Secondary | ICD-10-CM

## 2016-02-24 DIAGNOSIS — M25512 Pain in left shoulder: Secondary | ICD-10-CM

## 2016-02-24 DIAGNOSIS — R293 Abnormal posture: Secondary | ICD-10-CM

## 2016-02-24 NOTE — Therapy (Signed)
[place], [place], [address] Phone: [phone]   Fax:  [phone]  Physical Therapy Treatment  Patient Details  Name: [name] Foster MRN: [id number] Date of Birth: Sep 01, 1956 Referring Provider: Jolyn Nap   Encounter Date: 02/24/2016      PT End of Session - 02/24/16 1617    Visit Number 2   Number of Visits 24  12 sessions approved by workers comp    Date for PT Re-Evaluation 03/16/16   Authorization Type Workman's Comp (12 pre-approved as of 02/17/16, working on requesting more due to patient status)   Authorization Time Period 02/17/16 to 03/23/16   Authorization - Visit Number 2   Authorization - Number of Visits 12   PT Start Time 1518   PT Stop Time 1601   PT Time Calculation (min) 43 min   Activity Tolerance Patient tolerated treatment well   Behavior During Therapy [facility] for tasks assessed/performed      Past Medical History  Diagnosis Date  . Benign essential hypertension   . Abnormal EKG   . Tobacco abuse   . Apical variant hypertrophic cardiomyopathy ([place])   . Hypokalemia   . BPH (benign prostatic hyperplasia)     Past Surgical History  Procedure Laterality Date  . Cardiac catheterization      2008  . Kidney surgery Right April 2015    benign tumor removal  . Colonoscopy N/A 02/26/2015    Procedure: COLONOSCOPY;  Surgeon: Rogene Houston, MD;  [facility];  Service: Endoscopy;  Laterality: N/A;  830  . Prostate ablation  12-2015 at [facility]  . Shoulder arthroscopy with rotator cuff repair and subacromial decompression Left 01/18/2016    Procedure: LEFT SHOULDER ARTHROSCOPY WITH ROTATOR CUFF REPAIR AND SUBACROMIAL DECOMPRESSION;  Surgeon: Milly Jakob, MD;  Location: [facility];  Service: Orthopedics;  Laterality: Left;  PRE-OP BLOCK WITH GENERAL ANESTHESIA  . Shoulder acromioplasty Left 01/18/2016    Procedure: SHOULDER ACROMIOPLASTY;  Surgeon: Milly Jakob, MD;   Location: [facility];  Service: Orthopedics;  Laterality: Left;    There were no vitals filed for this visit.      Subjective Assessment - 02/24/16 1521    Subjective Patient reports that he is doing well; has been doing HEP and it feels OK but passive ROM exercises at table right now are very uncomfortable. Has a MD appointment next week at the same time he comes to see [place], so will need to reschedule this.    Pertinent History HTN, tobacco abuse, hx of cardiac cath    Currently in Pain? Yes   Pain Score 8    Pain Location Shoulder   Pain Orientation Left   Pain Descriptors / Indicators Sharp;Shooting   Pain Type Surgical pain   Pain Radiating Towards neck and down his arm    Pain Onset More than a month ago   Pain Frequency Constant   Aggravating Factors  moving and activity    Pain Relieving Factors pain medicine    Effect of Pain on Daily Activities cannot perform job based tasks                          [facility] Adult PT Treatment/Exercise - 02/24/16 0001    Shoulder Exercises: Supine   External Rotation PROM   External Rotation Limitations PROM 1x10 at approx 20-30 degrees ABD, 1x10   Flexion PROM   Flexion  Limitations PROM 1x10; approx 50-60 degrees, thoracic hyperextension and twisting of body due to pain    ABduction PROM   ABduction Limitations PROM 1x10 to approximatley 60 degrees; thoracic extension and trunk rotation from paitent    Other Supine Exercises distractions at beginning of session with oscillations    Shoulder Exercises: Seated   Other Seated Exercises flexiona nd abductions tretches at table 2 each, 60 seconds    Manual Therapy   Manual Therapy Joint mobilization   Manual therapy comments performed separately from all other skilled interventions    Joint Mobilization inferior and anterior glides L shoulder x3 attempts approx 10-20 sec each due to patient tolerance                 PT Education - 02/24/16 1616     Education provided Yes   Education Details reviewed initial eval and goals   Person(s) Educated Patient   Methods Explanation   Comprehension Verbalized understanding          PT Short Term Goals - 02/17/16 1545    PT SHORT TERM GOAL #1   Title Patient to demonstrate at least 100 degrees of L shoulder flexion and abduction in order to assist in reducing pain and improving general function    Time 3   Period Weeks   Status New   PT SHORT TERM GOAL #2   Title Patient to demonstrate 90 degrees L shoulder IR and at least 45 degrees L shoulder ER in order to assist in reducing pain adn improve function    Time 3   Period Weeks   Status New   PT SHORT TERM GOAL #3   Title Patient to demonstrate symmetrical neuromuscular recrutiment of all scapular and shoulder girlde muscular to demosntrate improved function and neuromuscular recruitment for functional task performance    Time 3   Period Weeks   Status New   PT SHORT TERM GOAL #4   Title Patient to experience pain no more than 4/10 L shoulder during all tasks in order to improve task performance and overall QOL    Time 3   Period Weeks   Status New   PT SHORT TERM GOAL #5   Title Patient to be independent in correctly and consistently performing appropriate HEP, to be updated PRN    Time 3   Period Weeks   Status New           PT Long Term Goals - 02/17/16 1548    PT LONG TERM GOAL #1   Title Patient to demonstrate at least 160 degrees L shoulder flexion and ABD in order to improve function and assist in improving overall QOL    Time 6   Period Weeks   Status New   PT LONG TERM GOAL #2   Title Patient to demonstrate L shoulder IR of 90 degrees and L shoulder ER of at least 80 degrees in order to improve function and assist in improving overall QOL    Time 6   Period Weeks   Status New   PT LONG TERM GOAL #3   Title Patient to demonstrate strength at least 4/5 in all tested muscle groups in order to improve function and  reduce pain, improve regional stability    Time 6   Period Weeks   Status New   PT LONG TERM GOAL #4   Title Patient to report he has been able to use both UEs to dress and wash his hair in  order to demosntrate improved general function and ability to perform self-care tasks    Time 6   Period Weeks   Status New   PT LONG TERM GOAL #5   Title Patient to report he has been able to return to light duty at work in order to assist in returning to function and improved QOL    Time [place] - 02/24/16 1619    Clinical Impression Statement Patient arrives today reporting that his HEP is going OK; his shoulder still feels very tight and sore, he continues to report 7-8/10 pain today. Focused sessino on mobility exercises for shoulder today; patient had impaired tolerance of this activity and attempted to compensate to reduce pain today, including thoracic extension and torsion of trunk to reduce ROM/reduce pain assocaited with motion. Introduced grade 1 anterior and inferior joint mobilizations today with only fair toelrance by patient today. Ended session with passive flexiona dn abduction stretching at table. Recommended to patient that he speak to MD about possibly getting a type of pain medication taht he can take througout day and before therapy to help with tolerance to sessions.    Rehab Potential Good   PT Frequency Other (comment)   PT Duration Other (comment)   PT Treatment/Interventions ADLs/Self Care Home Management;Cryotherapy;Moist Heat;Functional mobility training;Therapeutic activities;Therapeutic exercise;Neuromuscular re-education;Patient/family education;Manual techniques;Passive range of motion;Taping   PT Next Visit Plan ; go per Raliegh Ip 2015 protocol for rotator cuff; focus on mobility of shoulder and correct muscle recruitment of scapular stabilizers, posture, pain reduction. Start session with MFR and manual to shoulder  girlde, then progress into ROM exercise and glides.    PT Home Exercise Plan given    Consulted and Agree with Plan of Care Patient      Patient will benefit from skilled therapeutic intervention in order to improve the following deficits and impairments:  Hypomobility, Decreased knowledge of precautions, Decreased strength, Increased fascial restricitons, Impaired UE functional use, Pain, Increased muscle spasms, Improper body mechanics, Decreased coordination, Impaired flexibility, Postural dysfunction  Visit Diagnosis: Stiffness of left shoulder, not elsewhere classified  Pain in left shoulder  Muscle weakness (generalized)  Abnormal posture     Problem List Patient Active Problem List   Diagnosis Date Noted  . Hypokalemia   . Preop cardiovascular exam 02/24/2014  . Apical variant hypertrophic cardiomyopathy ([place]) 02/24/2014  . Tobacco abuse 02/24/2014  . HYPERTENSION, BENIGN 12/12/2008  . CHEST PAIN-UNSPECIFIED 12/12/2008  . ABNORMAL EKG 12/12/2008    Deniece Ree PT, DPT [facility] [address] [place], [place], [address] Phone: [phone]   Fax:  [phone]  Name: Gale Rando MRN: [id number] Date of Birth: [birth year]

## 2016-02-25 ENCOUNTER — Ambulatory Visit (HOSPITAL_COMMUNITY): Payer: Worker's Compensation | Admitting: Physical Therapy

## 2016-02-25 ENCOUNTER — Telehealth (HOSPITAL_COMMUNITY): Payer: Self-pay | Admitting: Physical Therapy

## 2016-02-25 DIAGNOSIS — M25612 Stiffness of left shoulder, not elsewhere classified: Secondary | ICD-10-CM | POA: Diagnosis not present

## 2016-02-25 DIAGNOSIS — M25512 Pain in left shoulder: Secondary | ICD-10-CM

## 2016-02-25 DIAGNOSIS — M6281 Muscle weakness (generalized): Secondary | ICD-10-CM

## 2016-02-25 DIAGNOSIS — R293 Abnormal posture: Secondary | ICD-10-CM

## 2016-02-25 NOTE — Therapy (Signed)
[facility] [place], [place], [address] Phone: [phone]   Fax:  [phone]  Physical Therapy Treatment  Patient Details  Name: [name] Foster MRN: [id number] Date of Birth: 1956/03/12 Referring Provider: Jolyn Nap   Encounter Date: 02/25/2016      PT End of Session - 02/25/16 1509    Visit Number 3   Number of Visits 24   Date for PT Re-Evaluation 03/16/16   Authorization Type Workman's Comp (12 pre-approved as of 02/17/16, working on requesting more due to patient status)   Authorization Time Period 02/17/16 to 03/23/16   Authorization - Visit Number 3   Authorization - Number of Visits 12   PT Start Time 1410   PT Stop Time 1500   PT Time Calculation (min) 50 min   Activity Tolerance Patient tolerated treatment well   Behavior During Therapy [facility] for tasks assessed/performed      Past Medical History  Diagnosis Date  . Benign essential hypertension   . Abnormal EKG   . Tobacco abuse   . Apical variant hypertrophic cardiomyopathy ([place])   . Hypokalemia   . BPH (benign prostatic hyperplasia)     Past Surgical History  Procedure Laterality Date  . Cardiac catheterization      2008  . Kidney surgery Right April 2015    benign tumor removal  . Colonoscopy N/A 02/26/2015    Procedure: COLONOSCOPY;  Surgeon: Rogene Houston, MD;  [facility];  Service: Endoscopy;  Laterality: N/A;  830  . Prostate ablation  12-2015 at [facility]  . Shoulder arthroscopy with rotator cuff repair and subacromial decompression Left 01/18/2016    Procedure: LEFT SHOULDER ARTHROSCOPY WITH ROTATOR CUFF REPAIR AND SUBACROMIAL DECOMPRESSION;  Surgeon: Milly Jakob, MD;  Location: [facility];  Service: Orthopedics;  Laterality: Left;  PRE-OP BLOCK WITH GENERAL ANESTHESIA  . Shoulder acromioplasty Left 01/18/2016    Procedure: SHOULDER ACROMIOPLASTY;  Surgeon: Milly Jakob, MD;  Location: [facility];   Service: Orthopedics;  Laterality: Left;    There were no vitals filed for this visit.      Subjective Assessment - 02/25/16 1505    Subjective Patient arrives late today, apologetic; he reports that he is still having high levels of pain and has been keeping up with HEP    Pertinent History HTN, tobacco abuse, hx of cardiac cath    Patient Stated Goals get back to job activites (very active)   Currently in Pain? Yes   Pain Score 7    Pain Location Shoulder   Pain Orientation Left                         [facility] Adult PT Treatment/Exercise - 02/25/16 0001    Shoulder Exercises: Supine   External Rotation PROM   External Rotation Limitations PROM 1x10 approximately 30 degrees    Flexion PROM   Flexion Limitations PROM 1x10 to approximately 60-70 degrees    ABduction PROM   ABduction Limitations PROM 1x10 to 70-90 degrees today    Shoulder Exercises: Seated   Other Seated Exercises flexiona nd abductions tretches at table 2 each, 60 seconds    Other Seated Exercises shoulder ADD isometric with towel; checked from on HEP exercises as well today    Shoulder Exercises: Standing   Other Standing Exercises shoulder pendulums 1x60 seconds CW and CCW    Other Standing Exercises standing weight shifts bearing weight  as tolerated x2 mintues; anterior glides in standing with UE on table/patient stepping forward 1x20   Manual Therapy   Manual Therapy Soft tissue mobilization;Joint mobilization   Manual therapy comments performed separately from all other skilled interventions    Joint Mobilization inferior grade 2, 2x30 seconds    Soft tissue mobilization MFR and soft tissue mobilization of shoulder girlde musculture, shoulder soft tissues                 PT Education - 02/25/16 1509    Education provided No          PT Short Term Goals - 02/17/16 1545    PT SHORT TERM GOAL #1   Title Patient to demonstrate at least 100 degrees of L shoulder flexion and  abduction in order to assist in reducing pain and improving general function    Time 3   Period Weeks   Status New   PT SHORT TERM GOAL #2   Title Patient to demonstrate 90 degrees L shoulder IR and at least 45 degrees L shoulder ER in order to assist in reducing pain adn improve function    Time 3   Period Weeks   Status New   PT SHORT TERM GOAL #3   Title Patient to demonstrate symmetrical neuromuscular recrutiment of all scapular and shoulder girlde muscular to demosntrate improved function and neuromuscular recruitment for functional task performance    Time 3   Period Weeks   Status New   PT SHORT TERM GOAL #4   Title Patient to experience pain no more than 4/10 L shoulder during all tasks in order to improve task performance and overall QOL    Time 3   Period Weeks   Status New   PT SHORT TERM GOAL #5   Title Patient to be independent in correctly and consistently performing appropriate HEP, to be updated PRN    Time 3   Period Weeks   Status New           PT Long Term Goals - 02/17/16 1548    PT LONG TERM GOAL #1   Title Patient to demonstrate at least 160 degrees L shoulder flexion and ABD in order to improve function and assist in improving overall QOL    Time 6   Period Weeks   Status New   PT LONG TERM GOAL #2   Title Patient to demonstrate L shoulder IR of 90 degrees and L shoulder ER of at least 80 degrees in order to improve function and assist in improving overall QOL    Time 6   Period Weeks   Status New   PT LONG TERM GOAL #3   Title Patient to demonstrate strength at least 4/5 in all tested muscle groups in order to improve function and reduce pain, improve regional stability    Time 6   Period Weeks   Status New   PT LONG TERM GOAL #4   Title Patient to report he has been able to use both UEs to dress and wash his hair in order to demosntrate improved general function and ability to perform self-care tasks    Time 6   Period Weeks   Status New    PT LONG TERM GOAL #5   Title Patient to report he has been able to return to light duty at work in order to assist in returning to function and improved QOL    Time 6   Period Weeks  Status New               Plan - 02/25/16 1510    Clinical Impression Statement Patient arrived today reporting ongoing pain, around 7/10; initiated session with MFR and soft tsisue mobilization to shoulder girdle and scapular musculature with significant muscle knotting and guarding noted today. Had OT take a look at patient's ROM and offer suggestions to assist in reducing pain during PROM activities today as well. Otherwise continued with ROM exercises, mobilization of shoulder joint, and strateggic strengthening strategies today. No increased pain throughout session today.    Rehab Potential Good   PT Frequency Other (comment)   PT Duration Other (comment)   PT Treatment/Interventions ADLs/Self Care Home Management;Cryotherapy;Moist Heat;Functional mobility training;Therapeutic activities;Therapeutic exercise;Neuromuscular re-education;Patient/family education;Manual techniques;Passive range of motion;Taping   PT Next Visit Plan ; go per [facility] Ip 2015 protocol for rotator cuff; focus on mobility of shoulder and correct muscle recruitment of scapular stabilizers, posture, pain reduction. Start session with MFR and manual to shoulder girlde, then progress into ROM exercise and glides.  Use HOB elevated, towel supporting UE.    PT Home Exercise Plan given    Consulted and Agree with Plan of Care Patient      Patient will benefit from skilled therapeutic intervention in order to improve the following deficits and impairments:  Hypomobility, Decreased knowledge of precautions, Decreased strength, Increased fascial restricitons, Impaired UE functional use, Pain, Increased muscle spasms, Improper body mechanics, Decreased coordination, Impaired flexibility, Postural dysfunction  Visit Diagnosis: Stiffness  of left shoulder, not elsewhere classified  Pain in left shoulder  Muscle weakness (generalized)  Abnormal posture     Problem List Patient Active Problem List   Diagnosis Date Noted  . Hypokalemia   . Preop cardiovascular exam 02/24/2014  . Apical variant hypertrophic cardiomyopathy ([place]) 02/24/2014  . Tobacco abuse 02/24/2014  . HYPERTENSION, BENIGN 12/12/2008  . CHEST PAIN-UNSPECIFIED 12/12/2008  . ABNORMAL EKG 12/12/2008    Deniece Ree PT, DPT [facility] [address] [place], [place], [address] Phone: [phone]   Fax:  [phone]  Name: [name] Foster MRN: [id number] Date of Birth: 02/02/1956

## 2016-02-25 NOTE — Telephone Encounter (Signed)
Patient appeared to be a no-show for today's session; called patient, who was apologetic and reported he is on his way, about 5 minutes away. He states that he simply just forgot that he had an appointment and will be here in 5 minutes.  Deniece Ree PT, DPT [phone]

## 2016-03-01 ENCOUNTER — Ambulatory Visit (HOSPITAL_COMMUNITY): Payer: Worker's Compensation | Attending: Orthopedic Surgery

## 2016-03-01 DIAGNOSIS — M6281 Muscle weakness (generalized): Secondary | ICD-10-CM | POA: Diagnosis present

## 2016-03-01 DIAGNOSIS — R293 Abnormal posture: Secondary | ICD-10-CM | POA: Insufficient documentation

## 2016-03-01 DIAGNOSIS — M25512 Pain in left shoulder: Secondary | ICD-10-CM | POA: Insufficient documentation

## 2016-03-01 DIAGNOSIS — M25612 Stiffness of left shoulder, not elsewhere classified: Secondary | ICD-10-CM | POA: Diagnosis not present

## 2016-03-01 NOTE — Therapy (Signed)
[facility] [address] [place], [place], [address] Phone: [phone]   Fax:  [phone]  Physical Therapy Treatment  Patient Details  Name: [name] Foster MRN: [id number] Date of Birth: 01/26/1956 Referring Provider: Jolyn Nap   Encounter Date: 03/01/2016      PT End of Session - 03/01/16 1655    Visit Number 4   Number of Visits 24   Date for PT Re-Evaluation 03/16/16   Authorization Type Worker's Comp (12 pre-approved as of 02/17/16, working on requesting more due to patient status)   Authorization Time Period 02/17/16 to 03/23/16   Authorization - Visit Number 4   Authorization - Number of Visits 12   PT Start Time [id number]   PT Stop Time 1649   PT Time Calculation (min) 45 min   Activity Tolerance Patient tolerated treatment well;Patient limited by pain   Behavior During Therapy [facility] for tasks assessed/performed      Past Medical History  Diagnosis Date  . Benign essential hypertension   . Abnormal EKG   . Tobacco abuse   . Apical variant hypertrophic cardiomyopathy ([place])   . Hypokalemia   . BPH (benign prostatic hyperplasia)     Past Surgical History  Procedure Laterality Date  . Cardiac catheterization      2008  . Kidney surgery Right April 2015    benign tumor removal  . Colonoscopy N/A 02/26/2015    Procedure: COLONOSCOPY;  Surgeon: Rogene Houston, MD;  [facility];  Service: Endoscopy;  Laterality: N/A;  830  . Prostate ablation  12-2015 at [facility]  . Shoulder arthroscopy with rotator cuff repair and subacromial decompression Left 01/18/2016    Procedure: LEFT SHOULDER ARTHROSCOPY WITH ROTATOR CUFF REPAIR AND SUBACROMIAL DECOMPRESSION;  Surgeon: Milly Jakob, MD;  Location: [facility];  Service: Orthopedics;  Laterality: Left;  PRE-OP BLOCK WITH GENERAL ANESTHESIA  . Shoulder acromioplasty Left 01/18/2016    Procedure: SHOULDER ACROMIOPLASTY;  Surgeon: Milly Jakob, MD;  Location: [facility];  Service: Orthopedics;  Laterality: Left;    There were no vitals filed for this visit.      Subjective Assessment - 03/01/16 1607    Subjective Pt reports he wakes with pain across both shoulders now, and is now talking to the doctor about the R shoulder. He has been working on [employer] regularly.    Pertinent History HTN, tobacco abuse, hx of cardiac cath    Patient Stated Goals get back to job activites (very active)   Currently in Pain? Yes   Pain Score 7    Pain Location Shoulder   Pain Orientation Left;Right   Pain Type Surgical pain   Pain Onset More than a month ago   Pain Frequency Constant                         [facility] Adult PT Treatment/Exercise - 03/01/16 0001    Exercises   Exercises Neck;Shoulder   Neck Exercises: Seated   Neck Retraction 3 secs;15 reps  cues to avoid cervical extention   Cervical Rotation 15 reps  bilat semirecumbent to promote extention and chin tucked.    Neck Exercises: Supine   Other Supine Exercise supine towel roll stretch at T5 durting manual therapy.    Shoulder Exercises: Supine   External Rotation PROM  5x30s to 5 degrees    Flexion PROM  5x30sec to 60 degrees   ABduction PROM  ABduction Limitations PROM to 75 degrees    Other Supine Exercises Elbow extension to 0 degrees, 5x30sec   Shoulder Exercises: Seated   Retraction Both;15 reps  3sec   Manual Therapy   Manual therapy comments long distraction at45-75 degrees abduction x 8 minutes   Myofascial Release L distal anterior detoild x5 minutes, painful  R UT MFTP release x 3 minutes                PT Education - 03/01/16 1654    Education provided Yes   Education Details be mindful shrugging for all activity and posturing, try to avoid.    Person(s) Educated Patient   Methods Explanation   Comprehension Verbalized understanding;Need further instruction          PT Short Term Goals - 02/17/16 1545    PT SHORT TERM GOAL #1    Title Patient to demonstrate at least 100 degrees of L shoulder flexion and abduction in order to assist in reducing pain and improving general function    Time 3   Period Weeks   Status New   PT SHORT TERM GOAL #2   Title Patient to demonstrate 90 degrees L shoulder IR and at least 45 degrees L shoulder ER in order to assist in reducing pain adn improve function    Time 3   Period Weeks   Status New   PT SHORT TERM GOAL #3   Title Patient to demonstrate symmetrical neuromuscular recrutiment of all scapular and shoulder girlde muscular to demosntrate improved function and neuromuscular recruitment for functional task performance    Time 3   Period Weeks   Status New   PT SHORT TERM GOAL #4   Title Patient to experience pain no more than 4/10 L shoulder during all tasks in order to improve task performance and overall QOL    Time 3   Period Weeks   Status New   PT SHORT TERM GOAL #5   Title Patient to be independent in correctly and consistently performing appropriate HEP, to be updated PRN    Time 3   Period Weeks   Status New           PT Long Term Goals - 02/17/16 1548    PT LONG TERM GOAL #1   Title Patient to demonstrate at least 160 degrees L shoulder flexion and ABD in order to improve function and assist in improving overall QOL    Time 6   Period Weeks   Status New   PT LONG TERM GOAL #2   Title Patient to demonstrate L shoulder IR of 90 degrees and L shoulder ER of at least 80 degrees in order to improve function and assist in improving overall QOL    Time 6   Period Weeks   Status New   PT LONG TERM GOAL #3   Title Patient to demonstrate strength at least 4/5 in all tested muscle groups in order to improve function and reduce pain, improve regional stability    Time 6   Period Weeks   Status New   PT LONG TERM GOAL #4   Title Patient to report he has been able to use both UEs to dress and wash his hair in order to demosntrate improved general function and  ability to perform self-care tasks    Time 6   Period Weeks   Status New   PT LONG TERM GOAL #5   Title Patient to report he has been  able to return to light duty at work in order to assist in returning to function and improved QOL    Time 6   Period Weeks   Status New               Plan - 03/01/16 1656    Clinical Impression Statement Pt much more painful today, with more pain in R shoulder neck and Ram's horn distribution HA, bilat soulder pain upon waking. Addition MFR added in to session today, but should have started here or perhaps heat, as triceps adn anteriro deltoid are extrememly limting and painful to all PROM. R UT is also very painful, but easier to release.    PT Frequency Other (comment)   PT Duration Other (comment)   PT Treatment/Interventions ADLs/Self Care Home Management;Cryotherapy;Moist Heat;Functional mobility training;Therapeutic activities;Therapeutic exercise;Neuromuscular re-education;Patient/family education;Manual techniques;Passive range of motion;Taping   PT Next Visit Plan ; go per [facility] 2015 protocol for rotator cuff; focus on mobility of shoulder and correct muscle recruitment of scapular stabilizers, posture, pain reduction. Start session with MFR and manual to shoulder girlde, then progress into ROM exercise and glides.  Use HOB elevated, towel supporting UE.    PT Home Exercise Plan no change   Consulted and Agree with Plan of Care Patient      Patient will benefit from skilled therapeutic intervention in order to improve the following deficits and impairments:  Hypomobility, Decreased knowledge of precautions, Decreased strength, Increased fascial restricitons, Impaired UE functional use, Pain, Increased muscle spasms, Improper body mechanics, Decreased coordination, Impaired flexibility, Postural dysfunction  Visit Diagnosis: Stiffness of left shoulder, not elsewhere classified  Pain in left shoulder  Muscle weakness  (generalized)  Abnormal posture     Problem List Patient Active Problem List   Diagnosis Date Noted  . Hypokalemia   . Preop cardiovascular exam 02/24/2014  . Apical variant hypertrophic cardiomyopathy ([place]) 02/24/2014  . Tobacco abuse 02/24/2014  . HYPERTENSION, BENIGN 12/12/2008  . CHEST PAIN-UNSPECIFIED 12/12/2008  . ABNORMAL EKG 12/12/2008    4:59 PM, 03/01/2016 Etta Grandchild, PT, DPT PRN Physical Therapist at [facility] # [id number] [phone] (office)     [facility] [address] [place], [place], [address] Phone: [phone]   Fax:  [phone]  Name: [name] Foster MRN: [id number] Date of Birth: 10-Sep-1956

## 2016-03-02 ENCOUNTER — Encounter (HOSPITAL_COMMUNITY): Payer: Self-pay | Admitting: Physical Therapy

## 2016-03-03 ENCOUNTER — Ambulatory Visit (HOSPITAL_COMMUNITY): Payer: Worker's Compensation | Admitting: Physical Therapy

## 2016-03-04 ENCOUNTER — Other Ambulatory Visit: Payer: Self-pay | Admitting: Family Medicine

## 2016-03-08 ENCOUNTER — Ambulatory Visit (HOSPITAL_COMMUNITY): Payer: Worker's Compensation

## 2016-03-08 DIAGNOSIS — M25612 Stiffness of left shoulder, not elsewhere classified: Secondary | ICD-10-CM

## 2016-03-08 DIAGNOSIS — M25512 Pain in left shoulder: Secondary | ICD-10-CM

## 2016-03-08 DIAGNOSIS — M6281 Muscle weakness (generalized): Secondary | ICD-10-CM

## 2016-03-08 DIAGNOSIS — R293 Abnormal posture: Secondary | ICD-10-CM

## 2016-03-08 NOTE — Therapy (Signed)
[facility] [address] [place], [place], [address] Phone: [phone]   Fax:  [phone]  Physical Therapy Treatment  Patient Details  Name: [name] Foster MRN: [id number] Date of Birth: Dec 19, 1955 Referring Provider: Jolyn Nap   Encounter Date: 03/08/2016      PT End of Session - 03/08/16 1700    Visit Number 5   Number of Visits 24   Date for PT Re-Evaluation 03/16/16   Authorization Type Worker's Comp (12 pre-approved as of 02/17/16, working on requesting more due to patient status)   Authorization Time Period 02/17/16 to 03/23/16   Authorization - Visit Number 5   Authorization - Number of Visits 12   PT Start Time [id number]   PT Stop Time 1831   PT Time Calculation (min) 38 min   Activity Tolerance Patient tolerated treatment well;Patient limited by pain   Behavior During Therapy [facility] for tasks assessed/performed      Past Medical History  Diagnosis Date  . Benign essential hypertension   . Abnormal EKG   . Tobacco abuse   . Apical variant hypertrophic cardiomyopathy ([place])   . Hypokalemia   . BPH (benign prostatic hyperplasia)     Past Surgical History  Procedure Laterality Date  . Cardiac catheterization      2008  . Kidney surgery Right April 2015    benign tumor removal  . Colonoscopy N/A 02/26/2015    Procedure: COLONOSCOPY;  Surgeon: Rogene Houston, MD;  [facility];  Service: Endoscopy;  Laterality: N/A;  830  . Prostate [facility]  12-2015 at [facility]  . Shoulder arthroscopy with rotator cuff repair and subacromial decompression Left 01/18/2016    Procedure: LEFT SHOULDER ARTHROSCOPY WITH ROTATOR CUFF REPAIR AND SUBACROMIAL DECOMPRESSION;  Surgeon: Milly Jakob, MD;  Location: [facility];  Service: Orthopedics;  Laterality: Left;  PRE-OP BLOCK WITH GENERAL ANESTHESIA  . Shoulder acromioplasty Left 01/18/2016    Procedure: SHOULDER ACROMIOPLASTY;  Surgeon: Milly Jakob, MD;  Location: [place];  Service: Orthopedics;  Laterality: Left;    There were no vitals filed for this visit.      Subjective Assessment - 03/08/16 1659    Subjective Pt stated his Lt shoulder is really stiff and tender, has been complaince with HEP   Pertinent History HTN, tobacco abuse, hx of cardiac cath    Patient Stated Goals get back to job activites (very active)   Currently in Pain? Yes   Pain Score 7             [facility] Adult PT Treatment/Exercise - 03/08/16 0001    Shoulder Exercises: Supine   External Rotation PROM   Flexion PROM   ABduction PROM   ABduction Limitations PROM to 75 degrees    Other Supine Exercises Elbow extension to 0 degrees, 5x30sec   Other Supine Exercises scapular and cervical retraction 10x   Modalities   Modalities Moist Heat   Moist Heat Therapy   Number Minutes Moist Heat 15 Minutes   Moist Heat Location Cervical;Shoulder   Manual Therapy   Manual Therapy Soft tissue mobilization;Passive ROM;Joint mobilization   Manual therapy comments long distraction at45-75 degrees abduction x 8 minutes   Soft tissue mobilization MFR and soft tissue mobilization of shoulder girlde musculture, shoulder soft tissues    Myofascial Release L distal anterior detoild, painful   Passive ROM all directions with tolerance  5 reps  PT Short Term Goals - 02/17/16 1545    PT SHORT TERM GOAL #1   Title Patient to demonstrate at least 100 degrees of L shoulder flexion and abduction in order to assist in reducing pain and improving general function    Time 3   Period Weeks   Status New   PT SHORT TERM GOAL #2   Title Patient to demonstrate 90 degrees L shoulder IR and at least 45 degrees L shoulder ER in order to assist in reducing pain adn improve function    Time 3   Period Weeks   Status New   PT SHORT TERM GOAL #3   Title Patient to demonstrate symmetrical neuromuscular recrutiment of all scapular and shoulder girlde muscular to  demosntrate improved function and neuromuscular recruitment for functional task performance    Time 3   Period Weeks   Status New   PT SHORT TERM GOAL #4   Title Patient to experience pain no more than 4/10 L shoulder during all tasks in order to improve task performance and overall QOL    Time 3   Period Weeks   Status New   PT SHORT TERM GOAL #5   Title Patient to be independent in correctly and consistently performing appropriate HEP, to be updated PRN    Time 3   Period Weeks   Status New           PT Long Term Goals - 02/17/16 1548    PT LONG TERM GOAL #1   Title Patient to demonstrate at least 160 degrees L shoulder flexion and ABD in order to improve function and assist in improving overall QOL    Time 6   Period Weeks   Status New   PT LONG TERM GOAL #2   Title Patient to demonstrate L shoulder IR of 90 degrees and L shoulder ER of at least 80 degrees in order to improve function and assist in improving overall QOL    Time 6   Period Weeks   Status New   PT LONG TERM GOAL #3   Title Patient to demonstrate strength at least 4/5 in all tested muscle groups in order to improve function and reduce pain, improve regional stability    Time 6   Period Weeks   Status New   PT LONG TERM GOAL #4   Title Patient to report he has been able to use both UEs to dress and wash his hair in order to demosntrate improved general function and ability to perform self-care tasks    Time 6   Period Weeks   Status New   PT LONG TERM GOAL #5   Title Patient to report he has been able to return to light duty at work in order to assist in returning to function and improved QOL    Time 6   Period Weeks   Status New               Plan - 03/08/16 1828    Clinical Impression Statement Session focus on manual technqiues for pain control and to improve PROM per MD protocol.  Pt limited by pain through session with noted significant spasms and overall tightness on biceps tendon,  triceps and anterior deltoid and rotator cuff musculature.  Myofascial release complete with MHP to relax musculature and reduce tightness.  PROM complete within pt. tolerance, noted improved tolerance with isometric contractions upon return with all exercises.  End of session pt reports pain scale  6/10.  Pt encouraged to continue current HEP exercises, no updated given this session.     Rehab Potential Good   PT Frequency Other (comment)  3x/week for 2 weeks, then 2x/week for 3 weeks; BUT if 24 vistis approved, up to 3x/week!!!    PT Duration Other (comment)  5 weeks   PT Treatment/Interventions ADLs/Self Care Home Management;Cryotherapy;Moist Heat;Functional mobility training;Therapeutic activities;Therapeutic exercise;Neuromuscular re-education;Patient/family education;Manual techniques;Passive range of motion;Taping   PT Next Visit Plan go per Raliegh Ip 2015 protocol for rotator cuff; focus on mobility of shoulder and correct muscle recruitment of scapular stabilizers, posture, pain reduction. Start session with MFR and manual to shoulder girlde, then progress into ROM exercise and glides.  Use HOB elevated, towel supporting UE.       Patient will benefit from skilled therapeutic intervention in order to improve the following deficits and impairments:  Hypomobility, Decreased knowledge of precautions, Decreased strength, Increased fascial restricitons, Impaired UE functional use, Pain, Increased muscle spasms, Improper body mechanics, Decreased coordination, Impaired flexibility, Postural dysfunction  Visit Diagnosis: Stiffness of left shoulder, not elsewhere classified  Pain in left shoulder  Muscle weakness (generalized)  Abnormal posture     Problem List Patient Active Problem List   Diagnosis Date Noted  . Hypokalemia   . Preop cardiovascular exam 02/24/2014  . Apical variant hypertrophic cardiomyopathy ([place]) 02/24/2014  . Tobacco abuse 02/24/2014  . HYPERTENSION, BENIGN  12/12/2008  . CHEST PAIN-UNSPECIFIED 12/12/2008  . ABNORMAL EKG 12/12/2008   Ihor Austin, LPTA; CBIS [phone]  Aldona Lento 03/08/2016, 6:37 PM  [facility] [address] [place], [place], [address] Phone: [phone]   Fax:  [phone]  Name: Ashot Carignan MRN: [id number] Date of Birth: Mar 04, 1956

## 2016-03-09 ENCOUNTER — Encounter (HOSPITAL_COMMUNITY): Payer: Self-pay | Admitting: Physical Therapy

## 2016-03-10 ENCOUNTER — Ambulatory Visit (HOSPITAL_COMMUNITY): Payer: Worker's Compensation | Admitting: Physical Therapy

## 2016-03-10 DIAGNOSIS — R293 Abnormal posture: Secondary | ICD-10-CM

## 2016-03-10 DIAGNOSIS — M25512 Pain in left shoulder: Secondary | ICD-10-CM

## 2016-03-10 DIAGNOSIS — M25612 Stiffness of left shoulder, not elsewhere classified: Secondary | ICD-10-CM | POA: Diagnosis not present

## 2016-03-10 DIAGNOSIS — M6281 Muscle weakness (generalized): Secondary | ICD-10-CM

## 2016-03-10 NOTE — Therapy (Signed)
[facility], [place], [address] Phone: [phone]   Fax:  [phone]  Physical Therapy Treatment  Patient Details  Name: [name] Foster MRN: [id number] Date of Birth: 1956-07-20 Referring Provider: Jolyn Nap   Encounter Date: 03/10/2016      PT End of Session - 03/10/16 1746    Visit Number 6   Number of Visits 24  only 12 sessions approved right now    Date for PT Re-Evaluation 03/16/16   Authorization Type Worker's Comp (12 pre-approved as of 02/17/16, working on requesting more due to patient status)   Authorization Time Period 02/17/16 to 03/23/16   Authorization - Visit Number 6   Authorization - Number of Visits 12   PT Start Time [id number]   PT Stop Time 1728   PT Time Calculation (min) 41 min   Activity Tolerance Patient tolerated treatment well;Patient limited by pain   Behavior During Therapy [facility] for tasks assessed/performed      Past Medical History  Diagnosis Date  . Benign essential hypertension   . Abnormal EKG   . Tobacco abuse   . Apical variant hypertrophic cardiomyopathy ([place])   . Hypokalemia   . BPH (benign prostatic hyperplasia)     Past Surgical History  Procedure Laterality Date  . Cardiac catheterization      2008  . Kidney surgery Right April 2015    benign tumor removal  . Colonoscopy N/A 02/26/2015    Procedure: COLONOSCOPY;  Surgeon: Rogene Houston, MD;  [facility];  Service: Endoscopy;  Laterality: N/A;  830  . Prostate ablation  12-2015 at [facility]  . Shoulder arthroscopy with rotator cuff repair and subacromial decompression Left 01/18/2016    Procedure: LEFT SHOULDER ARTHROSCOPY WITH ROTATOR CUFF REPAIR AND SUBACROMIAL DECOMPRESSION;  Surgeon: Milly Jakob, MD;  Location: [facility];  Service: Orthopedics;  Laterality: Left;  PRE-OP BLOCK WITH GENERAL ANESTHESIA  . Shoulder acromioplasty Left 01/18/2016    Procedure: SHOULDER ACROMIOPLASTY;   Surgeon: Milly Jakob, MD;  Location: [facility];  Service: Orthopedics;  Laterality: Left;    There were no vitals filed for this visit.      Subjective Assessment - 03/10/16 1740    Subjective Patient reports that he went to MD yesterday, who is happy with his progress but is keeping him in the sling for now; also reports that MD put him on a new medicine that caused severe HA and he is planning on calling MD tomorrow about this. Shoulder continues to be quite painful.    Pertinent History HTN, tobacco abuse, hx of cardiac cath    Patient Stated Goals get back to job activites (very active)   Currently in Pain? Yes   Pain Score 7    Pain Location Shoulder   Pain Orientation Left   Pain Descriptors / Indicators Sharp;Shooting   Pain Type Surgical pain   Pain Radiating Towards neck and down his arm    Pain Onset More than a month ago   Pain Frequency Constant   Aggravating Factors  moving and activity    Pain Relieving Factors pain medicine    Effect of Pain on Daily Activities cannot perform job based tasks                          [facility] Adult PT Treatment/Exercise - 03/10/16 0001    Shoulder Exercises: Supine   External Rotation  PROM;12 reps;Other (comment)  x4 each at approx 20, 40, and 70 degrees    Flexion PROM;Other (comment)  1x10 to approx 90-100 degrees    ABduction PROM;Other (comment)  1x10 to approx 80-90 degrees    Other Supine Exercises bicep curls 1x10, wrist flexion 1x10   Other Supine Exercises isometric L shoulder flexion, extension, abuction, ER, and IR all 1x10 with cautious apporach of 2 second holds    Manual Therapy   Manual Therapy Soft tissue mobilization;Joint mobilization   Manual therapy comments performed separately from all other skilled intervetnions    Joint Mobilization shoulder oscillation and distraction for pain relief wwith ROM    Soft tissue mobilization MFR and soft tissue mobilization of shoulder girlde  musculture, shoulder soft tissues                 PT Education - 03/10/16 1745    Education provided Yes   Education Details compared ROM measures at initial eval to approximate current measures to assit with patient morale; still trying to get number of sessions extended to 24 but dont' have any info back yet from workers comp    [employer]) Educated Patient   Methods Explanation   Comprehension Verbalized understanding          PT Short Term Goals - 02/17/16 1545    PT SHORT TERM GOAL #1   Title Patient to demonstrate at least 100 degrees of L shoulder flexion and abduction in order to assist in reducing pain and improving general function    Time 3   Period Weeks   Status New   PT SHORT TERM GOAL #2   Title Patient to demonstrate 90 degrees L shoulder IR and at least 45 degrees L shoulder ER in order to assist in reducing pain adn improve function    Time 3   Period Weeks   Status New   PT SHORT TERM GOAL #3   Title Patient to demonstrate symmetrical neuromuscular recrutiment of all scapular and shoulder girlde muscular to demosntrate improved function and neuromuscular recruitment for functional task performance    Time 3   Period Weeks   Status New   PT SHORT TERM GOAL #4   Title Patient to experience pain no more than 4/10 L shoulder during all tasks in order to improve task performance and overall QOL    Time 3   Period Weeks   Status New   PT SHORT TERM GOAL #5   Title Patient to be independent in correctly and consistently performing appropriate HEP, to be updated PRN    Time 3   Period Weeks   Status New           PT Long Term Goals - 02/17/16 1548    PT LONG TERM GOAL #1   Title Patient to demonstrate at least 160 degrees L shoulder flexion and ABD in order to improve function and assist in improving overall QOL    Time 6   Period Weeks   Status New   PT LONG TERM GOAL #2   Title Patient to demonstrate L shoulder IR of 90 degrees and L shoulder  ER of at least 80 degrees in order to improve function and assist in improving overall QOL    Time 6   Period Weeks   Status New   PT LONG TERM GOAL #3   Title Patient to demonstrate strength at least 4/5 in all tested muscle groups in order to improve function and reduce  pain, improve regional stability    Time 6   Period Weeks   Status New   PT LONG TERM GOAL #4   Title Patient to report he has been able to use both UEs to dress and wash his hair in order to demosntrate improved general function and ability to perform self-care tasks    Time 6   Period Weeks   Status New   PT LONG TERM GOAL #5   Title Patient to report he has been able to return to light duty at work in order to assist in returning to function and improved QOL    Time 6   Period Weeks   Status New               Plan - 03/10/16 1748    Clinical Impression Statement Discussed patient's MD apponitment at beginning of session, then contiued to progress with ROM Of left shoulder per protocol; did somewhat advance ER ROM technique today by performing at multiple angles (all under 80 degrees, see note for details). Introdcued cautious isometerics to L shoulder at all angles with only 2 second hold today, will assess pateint's response to this next session. Perofrmed elbwo and wrist exercise in supine as well today too. Still working on trying to get extended approval from [employer], will private message MD an update on this and on pateitn's general progrses as well today.    Rehab Potential Good   PT Frequency Other (comment)   PT Duration Other (comment)   PT Treatment/Interventions ADLs/Self Care Home Management;Cryotherapy;Moist Heat;Functional mobility training;Therapeutic activities;Therapeutic exercise;Neuromuscular re-education;Patient/family education;Manual techniques;Passive range of motion;Taping   PT Next Visit Plan go per Raliegh Ip 2015 protocol for rotator cuff (between weeks 3 and 4 as of  5/11); focus on mobility of shoulder and correct muscle recruitment of scapular stabilizers, posture, pain reduction. Start session with MFR and manual to shoulder girlde, then progress into ROM exercise and glides.  Use HOB elevated, towel supporting UE.    PT Home Exercise Plan no change   Consulted and Agree with Plan of Care Patient      Patient will benefit from skilled therapeutic intervention in order to improve the following deficits and impairments:  Hypomobility, Decreased knowledge of precautions, Decreased strength, Increased fascial restricitons, Impaired UE functional use, Pain, Increased muscle spasms, Improper body mechanics, Decreased coordination, Impaired flexibility, Postural dysfunction  Visit Diagnosis: Stiffness of left shoulder, not elsewhere classified  Pain in left shoulder  Muscle weakness (generalized)  Abnormal posture     Problem List Patient Active Problem List   Diagnosis Date Noted  . Hypokalemia   . Preop cardiovascular exam 02/24/2014  . Apical variant hypertrophic cardiomyopathy ([place]) 02/24/2014  . Tobacco abuse 02/24/2014  . HYPERTENSION, BENIGN 12/12/2008  . CHEST PAIN-UNSPECIFIED 12/12/2008  . ABNORMAL EKG 12/12/2008    Deniece Ree PT, DPT [facility] [address] [place], [place], [address] Phone: [phone]   Fax:  [phone]  Name: [name] Foster MRN: [id number] Date of Birth: [birth year]

## 2016-03-11 ENCOUNTER — Ambulatory Visit (HOSPITAL_COMMUNITY): Payer: Worker's Compensation

## 2016-03-11 DIAGNOSIS — M6281 Muscle weakness (generalized): Secondary | ICD-10-CM

## 2016-03-11 DIAGNOSIS — R293 Abnormal posture: Secondary | ICD-10-CM

## 2016-03-11 DIAGNOSIS — M25612 Stiffness of left shoulder, not elsewhere classified: Secondary | ICD-10-CM

## 2016-03-11 DIAGNOSIS — M25512 Pain in left shoulder: Secondary | ICD-10-CM

## 2016-03-11 NOTE — Therapy (Signed)
[place], [place], [address] Phone: [phone]   Fax:  [phone]  Physical Therapy Treatment  Patient Details  Name: [name] Foster MRN: [id number] Date of Birth: 1956/10/31 Referring Provider: Jolyn Nap   Encounter Date: 03/11/2016      PT End of Session - 03/11/16 1432    Visit Number 7   Number of Visits 24  Approved for 24 units   Date for PT Re-Evaluation 03/16/16   Authorization Type Worker's Comp (12 pre-approved as of 02/17/16, working on requesting more due to patient status)   Authorization Time Period 02/17/16 to 03/23/16   Authorization - Visit Number 7   Authorization - Number of Visits 12   PT Start Time [id number]   PT Stop Time 1519   PT Time Calculation (min) 56 min   Activity Tolerance Patient tolerated treatment well;Patient limited by pain   Behavior During Therapy [facility] for tasks assessed/performed      Past Medical History  Diagnosis Date  . Benign essential hypertension   . Abnormal EKG   . Tobacco abuse   . Apical variant hypertrophic cardiomyopathy ([place])   . Hypokalemia   . BPH (benign prostatic hyperplasia)     Past Surgical History  Procedure Laterality Date  . Cardiac catheterization      2008  . Kidney surgery Right April 2015    benign tumor removal  . Colonoscopy N/A 02/26/2015    Procedure: COLONOSCOPY;  Surgeon: Rogene Houston, MD;  [facility];  Service: Endoscopy;  Laterality: N/A;  830  . Prostate ablation  12-2015 at [facility]  . Shoulder arthroscopy with rotator cuff repair and subacromial decompression Left 01/18/2016    Procedure: LEFT SHOULDER ARTHROSCOPY WITH ROTATOR CUFF REPAIR AND SUBACROMIAL DECOMPRESSION;  Surgeon: Milly Jakob, MD;  Location: [place];  Service: Orthopedics;  Laterality: Left;  PRE-OP BLOCK WITH GENERAL ANESTHESIA  . Shoulder acromioplasty Left 01/18/2016    Procedure: SHOULDER ACROMIOPLASTY;  Surgeon: Milly Jakob, MD;  Location: [place];  Service: Orthopedics;  Laterality: Left;    There were no vitals filed for this visit.      Subjective Assessment - 03/11/16 1430    Subjective Pt continues to have high pain scale Lt shoulder, pain scale 7/10.  Reports called MD about new medication that increased headache and Lt side neck pain, advised to stop taking medication.     Pertinent History HTN, tobacco abuse, hx of cardiac cath    Patient Stated Goals get back to job activites (very active)   Currently in Pain? Yes   Pain Score 7    Pain Location Shoulder   Pain Orientation Left   Pain Descriptors / Indicators Aching   Pain Type Surgical pain   Pain Radiating Towards neck and down his arm lighting bolts with movement   Pain Onset More than a month ago   Pain Frequency Constant   Aggravating Factors  moving and activity   Pain Relieving Factors pain medicine and MHP   Effect of Pain on Daily Activities cannot perform job based tasks            [facility] Adult PT Treatment/Exercise - 03/11/16 0001    Shoulder Exercises: Supine   External Rotation PROM;12 reps;Other (comment)   Flexion PROM;Other (comment);12 reps   ABduction 12 reps   Other Supine Exercises bicep curls 1x10, wrist flexion 1x10   Other Supine Exercises scapular retraction 10x  Shoulder Exercises: Seated   Other Seated Exercises flexion and abduction stretches on table 5 reps x 45 seconds holds   Shoulder Exercises: Standing   External Rotation AAROM;Left;5 reps;Limitations   External Rotation Limitations at doorway elbow at waist   Modalities   Modalities Moist Heat   Moist Heat Therapy   Number Minutes Moist Heat 15 Minutes   Moist Heat Location Cervical;Shoulder   Manual Therapy   Manual Therapy Soft tissue mobilization;Joint mobilization   Manual therapy comments performed separately from all other skilled intervetnions    Joint Mobilization shoulder oscillation and distraction for pain  relief wwith ROM    Soft tissue mobilization MFR and soft tissue mobilization of shoulder girlde musculture, shoulder and upper trap soft tissue mobilization   Myofascial Release L distal anterior detoild, painful   Passive ROM all directions with tolerance  12 reps                PT Short Term Goals - 02/17/16 1545    PT SHORT TERM GOAL #1   Title Patient to demonstrate at least 100 degrees of L shoulder flexion and abduction in order to assist in reducing pain and improving general function    Time 3   Period Weeks   Status New   PT SHORT TERM GOAL #2   Title Patient to demonstrate 90 degrees L shoulder IR and at least 45 degrees L shoulder ER in order to assist in reducing pain adn improve function    Time 3   Period Weeks   Status New   PT SHORT TERM GOAL #3   Title Patient to demonstrate symmetrical neuromuscular recrutiment of all scapular and shoulder girlde muscular to demosntrate improved function and neuromuscular recruitment for functional task performance    Time 3   Period Weeks   Status New   PT SHORT TERM GOAL #4   Title Patient to experience pain no more than 4/10 L shoulder during all tasks in order to improve task performance and overall QOL    Time 3   Period Weeks   Status New   PT SHORT TERM GOAL #5   Title Patient to be independent in correctly and consistently performing appropriate HEP, to be updated PRN    Time 3   Period Weeks   Status New           PT Long Term Goals - 02/17/16 1548    PT LONG TERM GOAL #1   Title Patient to demonstrate at least 160 degrees L shoulder flexion and ABD in order to improve function and assist in improving overall QOL    Time 6   Period Weeks   Status New   PT LONG TERM GOAL #2   Title Patient to demonstrate L shoulder IR of 90 degrees and L shoulder ER of at least 80 degrees in order to improve function and assist in improving overall QOL    Time 6   Period Weeks   Status New   PT LONG TERM GOAL #3    Title Patient to demonstrate strength at least 4/5 in all tested muscle groups in order to improve function and reduce pain, improve regional stability    Time 6   Period Weeks   Status New   PT LONG TERM GOAL #4   Title Patient to report he has been able to use both UEs to dress and wash his hair in order to demosntrate improved general function and ability to perform self-care  tasks    Time 6   Period Weeks   Status New   PT LONG TERM GOAL #5   Title Patient to report he has been able to return to light duty at work in order to assist in returning to function and improved QOL    Time 6   Period Weeks   Status New               Plan - 03/11/16 1528    Clinical Impression Statement Began session with MHP x10 minutes to Lt neck and shoulder to reduce musculature tightness and increase relaxation prior manual soft tissue mobilization and PROM (no charge for MHP).  Session focus on improving Lt shoulder ROM with PROM/AAROM all directions per pt tolerance.  Added soft tissue mobilizaiton techniques to address Lt Upper traps and continues MFR to anterior deltiod and bicep tenden to reduce tightness.  Pt continues to be limited range all directions approximately 95 degrees with flexion and abduction with pain, distraction assists with pain control during PROM.  Added standing ER stretch, pt able to demonstrate appropriate technqiue with new exercise and plans to add to HEP.  Pt limited by pain through session with pain scale at 6/10 at end of session.  Secretary reports call from workers comp, 24 session approved.   PT Frequency 3x / week  Worker comp approved 24 sessions, frequency at 3x a week   PT Duration Other (comment)  Worker comp approved 24 sessions, frequency at 3x a week   PT Treatment/Interventions ADLs/Self Care Home Management;Cryotherapy;Moist Heat;Functional mobility training;Therapeutic activities;Therapeutic exercise;Neuromuscular re-education;Patient/family education;Manual  techniques;Passive range of motion;Taping   PT Next Visit Plan go per [facility] Ip 2015 protocol for rotator cuff (between weeks 3 and 4 as of 5/11); focus on mobility of shoulder and correct muscle recruitment of scapular stabilizers, posture, pain reduction. Start session with MFR and manual to shoulder girlde, then progress into ROM exercise and glides.  Use HOB elevated, towel supporting UE.       Patient will benefit from skilled therapeutic intervention in order to improve the following deficits and impairments:  Hypomobility, Decreased knowledge of precautions, Decreased strength, Increased fascial restricitons, Impaired UE functional use, Pain, Increased muscle spasms, Improper body mechanics, Decreased coordination, Impaired flexibility, Postural dysfunction  Visit Diagnosis: Stiffness of left shoulder, not elsewhere classified  Pain in left shoulder  Muscle weakness (generalized)  Abnormal posture     Problem List Patient Active Problem List   Diagnosis Date Noted  . Hypokalemia   . Preop cardiovascular exam 02/24/2014  . Apical variant hypertrophic cardiomyopathy ([place]) 02/24/2014  . Tobacco abuse 02/24/2014  . HYPERTENSION, BENIGN 12/12/2008  . CHEST PAIN-UNSPECIFIED 12/12/2008  . ABNORMAL EKG 12/12/2008   Ihor Austin, LPTA; [place]  Aldona Lento 03/11/2016, 4:13 PM  [place], [place], [address] Phone: [phone]   Fax:  [phone]  Name: Danni Mees MRN: [id number] Date of Birth: 1956/02/08

## 2016-03-15 ENCOUNTER — Ambulatory Visit (HOSPITAL_COMMUNITY): Payer: Worker's Compensation | Admitting: Physical Therapy

## 2016-03-15 DIAGNOSIS — M6281 Muscle weakness (generalized): Secondary | ICD-10-CM

## 2016-03-15 DIAGNOSIS — M25612 Stiffness of left shoulder, not elsewhere classified: Secondary | ICD-10-CM

## 2016-03-15 DIAGNOSIS — R293 Abnormal posture: Secondary | ICD-10-CM

## 2016-03-15 DIAGNOSIS — M25512 Pain in left shoulder: Secondary | ICD-10-CM

## 2016-03-15 NOTE — Therapy (Signed)
[place], [place], [address] Phone: [phone]   Fax:  [phone]  Physical Therapy Treatment  Patient Details  Name: [name] Foster MRN: [id number] Date of Birth: 09/07/1956 Referring Provider: Jolyn Nap   Encounter Date: 03/15/2016      PT End of Session - 03/15/16 1451    Visit Number 8   Number of Visits 24  Approved for 24 units   Date for PT Re-Evaluation 03/16/16   Authorization Type Worker's Comp (12 pre-approved as of 02/17/16, working on requesting more due to patient status)   Authorization Time Period 02/17/16 to 03/23/16   Authorization - Visit Number 8   Authorization - Number of Visits 12   PT Start Time 1301   PT Stop Time 1344   PT Time Calculation (min) 43 min   Activity Tolerance Patient tolerated treatment well;Patient limited by pain   Behavior During Therapy [facility] for tasks assessed/performed      Past Medical History  Diagnosis Date  . Benign essential hypertension   . Abnormal EKG   . Tobacco abuse   . Apical variant hypertrophic cardiomyopathy ([place])   . Hypokalemia   . BPH (benign prostatic hyperplasia)     Past Surgical History  Procedure Laterality Date  . Cardiac catheterization      2008  . Kidney surgery Right April 2015    benign tumor removal  . Colonoscopy N/A 02/26/2015    Procedure: COLONOSCOPY;  Surgeon: Rogene Houston, MD;  [facility];  Service: Endoscopy;  Laterality: N/A;  830  . Prostate ablation  12-2015 at [facility]  . Shoulder arthroscopy with rotator cuff repair and subacromial decompression Left 01/18/2016    Procedure: LEFT SHOULDER ARTHROSCOPY WITH ROTATOR CUFF REPAIR AND SUBACROMIAL DECOMPRESSION;  Surgeon: Milly Jakob, MD;  Location: [place];  Service: Orthopedics;  Laterality: Left;  PRE-OP BLOCK WITH GENERAL ANESTHESIA  . Shoulder acromioplasty Left 01/18/2016    Procedure: SHOULDER ACROMIOPLASTY;  Surgeon: Milly Jakob, MD;  Location: [place];  Service: Orthopedics;  Laterality: Left;    There were no vitals filed for this visit.      Subjective Assessment - 03/15/16 1305    Subjective Pt reports he is doing better with his medications now. He says his boss is wanting a note from his MD to let them know when he can go back to work. He notes aching pain in his anterior shoulder currently. States at night he has an aching pain along his L inferior scapula.     Pertinent History HTN, tobacco abuse, hx of cardiac cath    Patient Stated Goals get back to job activites (very active)   Currently in Pain? Yes   Pain Score 6    Pain Location Shoulder   Pain Orientation Left   Pain Descriptors / Indicators Aching   Pain Type Surgical pain   Pain Radiating Towards L deltoid insertion   Pain Onset More than a month ago                         [facility] Adult PT Treatment/Exercise - 03/15/16 0001    Shoulder Exercises: Seated   Retraction Both;15 reps   Manual Therapy   Manual Therapy Soft tissue mobilization;Scapular mobilization;Passive ROM   Manual therapy comments performed separately from all other skilled intervetnions    Soft tissue mobilization TrP release L rhomboids, L UT  Scapular Mobilization S<>I, protraction<>retraction   Passive ROM flexion to ~100 deg, Abd to ~90 deg   Neck Exercises: Stretches   Upper Trapezius Stretch 3 reps;30 seconds  L UT                PT Education - 03/15/16 1450    Education provided Yes   Education Details discussed posture and encouraged increased awareness to decrease upper trap pain and trigger points; additions to HEP; encouraged pt to continue with ROM therex at home.   Person(s) Educated Patient   Methods Explanation;Demonstration;Handout   Comprehension Verbalized understanding;Need further instruction          PT Short Term Goals - 02/17/16 1545    PT SHORT TERM GOAL #1   Title Patient to  demonstrate at least 100 degrees of L shoulder flexion and abduction in order to assist in reducing pain and improving general function    Time 3   Period Weeks   Status New   PT SHORT TERM GOAL #2   Title Patient to demonstrate 90 degrees L shoulder IR and at least 45 degrees L shoulder ER in order to assist in reducing pain adn improve function    Time 3   Period Weeks   Status New   PT SHORT TERM GOAL #3   Title Patient to demonstrate symmetrical neuromuscular recrutiment of all scapular and shoulder girlde muscular to demosntrate improved function and neuromuscular recruitment for functional task performance    Time 3   Period Weeks   Status New   PT SHORT TERM GOAL #4   Title Patient to experience pain no more than 4/10 L shoulder during all tasks in order to improve task performance and overall QOL    Time 3   Period Weeks   Status New   PT SHORT TERM GOAL #5   Title Patient to be independent in correctly and consistently performing appropriate HEP, to be updated PRN    Time 3   Period Weeks   Status New           PT Long Term Goals - 02/17/16 1548    PT LONG TERM GOAL #1   Title Patient to demonstrate at least 160 degrees L shoulder flexion and ABD in order to improve function and assist in improving overall QOL    Time 6   Period Weeks   Status New   PT LONG TERM GOAL #2   Title Patient to demonstrate L shoulder IR of 90 degrees and L shoulder ER of at least 80 degrees in order to improve function and assist in improving overall QOL    Time 6   Period Weeks   Status New   PT LONG TERM GOAL #3   Title Patient to demonstrate strength at least 4/5 in all tested muscle groups in order to improve function and reduce pain, improve regional stability    Time 6   Period Weeks   Status New   PT LONG TERM GOAL #4   Title Patient to report he has been able to use both UEs to dress and wash his hair in order to demosntrate improved general function and ability to perform  self-care tasks    Time 6   Period Weeks   Status New   PT LONG TERM GOAL #5   Title Patient to report he has been able to return to light duty at work in order to assist in returning to function and improved QOL  Time 6   Period Weeks   Status New               Plan - 03/15/16 1452    Clinical Impression Statement Today's session focused on postural education to improve pt sitting posture and promote upper trap/periscapular muscle relaxation. Manual treatment consisted of Lt shoulder PROM as well as trigger point release to Lt scapular musculature and upper trap with therapist providing max encouragement throughout session to decrease guarding. Pt reporting improvement by the end of today's session. Will continue with current POC.   PT Frequency 3x / week  Worker comp approved 24 sessions, frequency at 3x a week   PT Duration Other (comment)  Worker comp approved 24 sessions, frequency at 3x a week   PT Treatment/Interventions ADLs/Self Care Home Management;Cryotherapy;Moist Heat;Functional mobility training;Therapeutic activities;Therapeutic exercise;Neuromuscular re-education;Patient/family education;Manual techniques;Passive range of motion;Taping   PT Next Visit Plan go per [facility] Ip 2015 protocol for rotator cuff (between weeks 4 and 5 as of 5/16); focus on posture awareness, shoulder mobility and correct muscle recruitment of scapular stabilizers.  Use HOB elevated, towel supporting UE.    PT Home Exercise Plan updated with scap retraction and L upper trap stretch   Consulted and Agree with Plan of Care Patient      Patient will benefit from skilled therapeutic intervention in order to improve the following deficits and impairments:  Hypomobility, Decreased knowledge of precautions, Decreased strength, Increased fascial restricitons, Impaired UE functional use, Pain, Increased muscle spasms, Improper body mechanics, Decreased coordination, Impaired flexibility, Postural  dysfunction  Visit Diagnosis: Stiffness of left shoulder, not elsewhere classified  Pain in left shoulder  Muscle weakness (generalized)  Abnormal posture     Problem List Patient Active Problem List   Diagnosis Date Noted  . Hypokalemia   . Preop cardiovascular exam 02/24/2014  . Apical variant hypertrophic cardiomyopathy ([place]) 02/24/2014  . Tobacco abuse 02/24/2014  . HYPERTENSION, BENIGN 12/12/2008  . CHEST PAIN-UNSPECIFIED 12/12/2008  . ABNORMAL EKG 12/12/2008   2:58 PM,03/15/2016 Elly Modena PT, DPT [facility] [place] [address] [place], [place], [address] Phone: [phone]   Fax:  [phone]  Name: Azure Mirante MRN: [id number] Date of Birth: 12-26-1955

## 2016-03-16 ENCOUNTER — Encounter (HOSPITAL_COMMUNITY): Payer: Self-pay

## 2016-03-17 ENCOUNTER — Ambulatory Visit (HOSPITAL_COMMUNITY): Payer: Worker's Compensation | Admitting: Physical Therapy

## 2016-03-17 DIAGNOSIS — M25612 Stiffness of left shoulder, not elsewhere classified: Secondary | ICD-10-CM

## 2016-03-17 DIAGNOSIS — M6281 Muscle weakness (generalized): Secondary | ICD-10-CM

## 2016-03-17 DIAGNOSIS — M25512 Pain in left shoulder: Secondary | ICD-10-CM

## 2016-03-17 DIAGNOSIS — R293 Abnormal posture: Secondary | ICD-10-CM

## 2016-03-17 NOTE — Therapy (Signed)
[facility] [address] [place], [place], [address] Phone: [phone]   Fax:  [phone]  Physical Therapy Treatment  Patient Details  Name: [name] Foster MRN: [id number] Date of Birth: [birth year] Referring Provider: Jolyn Nap   Encounter Date: 03/17/2016      PT End of Session - 03/17/16 1343    Visit Number 9   Number of Visits 24  Approved for 24 units   Date for PT Re-Evaluation 03/23/16   Authorization Type Worker's Comp (12 pre-approved as of 02/17/16, working on requesting more due to patient status)   Authorization Time Period 02/17/16 to 03/23/16   Authorization - Visit Number 9   Authorization - Number of Visits 24   PT Start Time 1300   PT Stop Time 1341   PT Time Calculation (min) 41 min   Activity Tolerance Patient tolerated treatment well;Patient limited by pain   Behavior During Therapy [facility] for tasks assessed/performed      Past Medical History  Diagnosis Date  . Benign essential hypertension   . Abnormal EKG   . Tobacco abuse   . Apical variant hypertrophic cardiomyopathy ([place])   . Hypokalemia   . BPH (benign prostatic hyperplasia)     Past Surgical History  Procedure Laterality Date  . Cardiac catheterization      2008  . Kidney surgery Right April 2015    benign tumor removal  . Colonoscopy N/A 02/26/2015    Procedure: COLONOSCOPY;  Surgeon: Rogene Houston, MD;  [facility];  Service: Endoscopy;  Laterality: N/A;  830  . Prostate ablation  12-2015 at [facility]  . Shoulder arthroscopy with rotator cuff repair and subacromial decompression Left 01/18/2016    Procedure: LEFT SHOULDER ARTHROSCOPY WITH ROTATOR CUFF REPAIR AND SUBACROMIAL DECOMPRESSION;  Surgeon: Milly Jakob, MD;  Location: [employer];  Service: Orthopedics;  Laterality: Left;  PRE-OP BLOCK WITH GENERAL ANESTHESIA  . Shoulder acromioplasty Left 01/18/2016    Procedure: SHOULDER ACROMIOPLASTY;  Surgeon: Milly Jakob, MD;  Location: [place];  Service: Orthopedics;  Laterality: Left;    There were no vitals filed for this visit.      Subjective Assessment - 03/17/16 1306    Subjective Pt reports he has been trying the exercises and stretches he was shown his last session. Conitnues to report L upper trap pain 6/10 currently.  No other complaints.   Pertinent History HTN, tobacco abuse, hx of cardiac cath    Patient Stated Goals get back to job activites (very active)   Currently in Pain? Yes   Pain Score 6    Pain Location --  upper trap   Pain Orientation Left   Pain Descriptors / Indicators Aching   Pain Type Surgical pain   Pain Onset More than a month ago   Aggravating Factors  moving/all activity   Pain Relieving Factors pain meds help some   Effect of Pain on Daily Activities ADLs, job                         [facility] Adult PT Treatment/Exercise - 03/17/16 0001    Manual Therapy   Manual Therapy Soft tissue mobilization;Passive ROM   Manual therapy comments performed separately from all other skilled intervetnions    Soft tissue mobilization TrP release L upper trap, scar mobilization   Passive ROM abduction with grade I-II inferior mobs for pain relief; Flexion/ER at 30deg; intermittent long  axis distraction and oscillations for pain control                PT Education - 03/17/16 1342    Education provided Yes   Education Details reminded pt to be more aware of head position throughout day; educated on joint capsule in the shoulder and importance of increased reps/hold during PROM at home for improved stretch    Person(s) Educated Patient   Methods Explanation;Demonstration   Comprehension Verbal cues required;Verbalized understanding          PT Short Term Goals - 02/17/16 1545    PT SHORT TERM GOAL #1   Title Patient to demonstrate at least 100 degrees of L shoulder flexion and abduction in order to assist in reducing pain and  improving general function    Time 3   Period Weeks   Status New   PT SHORT TERM GOAL #2   Title Patient to demonstrate 90 degrees L shoulder IR and at least 45 degrees L shoulder ER in order to assist in reducing pain adn improve function    Time 3   Period Weeks   Status New   PT SHORT TERM GOAL #3   Title Patient to demonstrate symmetrical neuromuscular recrutiment of all scapular and shoulder girlde muscular to demosntrate improved function and neuromuscular recruitment for functional task performance    Time 3   Period Weeks   Status New   PT SHORT TERM GOAL #4   Title Patient to experience pain no more than 4/10 L shoulder during all tasks in order to improve task performance and overall QOL    Time 3   Period Weeks   Status New   PT SHORT TERM GOAL #5   Title Patient to be independent in correctly and consistently performing appropriate HEP, to be updated PRN    Time 3   Period Weeks   Status New           PT Long Term Goals - 02/17/16 1548    PT LONG TERM GOAL #1   Title Patient to demonstrate at least 160 degrees L shoulder flexion and ABD in order to improve function and assist in improving overall QOL    Time 6   Period Weeks   Status New   PT LONG TERM GOAL #2   Title Patient to demonstrate L shoulder IR of 90 degrees and L shoulder ER of at least 80 degrees in order to improve function and assist in improving overall QOL    Time 6   Period Weeks   Status New   PT LONG TERM GOAL #3   Title Patient to demonstrate strength at least 4/5 in all tested muscle groups in order to improve function and reduce pain, improve regional stability    Time 6   Period Weeks   Status New   PT LONG TERM GOAL #4   Title Patient to report he has been able to use both UEs to dress and wash his hair in order to demosntrate improved general function and ability to perform self-care tasks    Time 6   Period Weeks   Status New   PT LONG TERM GOAL #5   Title Patient to report he  has been able to return to light duty at work in order to assist in returning to function and improved QOL    Time 6   Period Weeks   Status New  Plan - 03/17/16 1344    Clinical Impression Statement Today's session continued to focus on manual treatment to improve shoulder ROM and pain. Pt reporting improved relaxation post manual STM to scar and upper trap trigger points, however he noted it had returned after ROM portion of his session with increased guarding and shoulder shrugging. Therapist encouraged pt to increase reps and frequency of his HEP at home as well as discussed the importance of gaining ROM to prevent capsule tighening. Pt reporting full understanding at this time.    Rehab Potential Good   PT Frequency 3x / week  Worker comp approved 24 sessions, frequency at 3x a week   PT Duration Other (comment)  Worker comp approved 24 sessions, frequency at 3x a week   PT Treatment/Interventions ADLs/Self Care Home Management;Cryotherapy;Moist Heat;Functional mobility training;Therapeutic activities;Therapeutic exercise;Neuromuscular re-education;Patient/family education;Manual techniques;Passive range of motion;Taping   PT Next Visit Plan go per Raliegh Ip 2015 protocol for rotator cuff (between weeks 4 and 5 as of 5/16); focus on posture awareness, shoulder mobility and correct muscle recruitment of scapular stabilizers.  Use HOB elevated, towel supporting UE.    PT Home Exercise Plan updated with scap retraction and L upper trap stretch; encouraged increased reps with ROM at home   Consulted and Agree with Plan of Care Patient      Patient will benefit from skilled therapeutic intervention in order to improve the following deficits and impairments:  Hypomobility, Decreased knowledge of precautions, Decreased strength, Increased fascial restricitons, Impaired UE functional use, Pain, Increased muscle spasms, Improper body mechanics, Decreased coordination,  Impaired flexibility, Postural dysfunction  Visit Diagnosis: Stiffness of left shoulder, not elsewhere classified  Pain in left shoulder  Muscle weakness (generalized)  Abnormal posture     Problem List Patient Active Problem List   Diagnosis Date Noted  . Hypokalemia   . Preop cardiovascular exam 02/24/2014  . Apical variant hypertrophic cardiomyopathy ([place]) 02/24/2014  . Tobacco abuse 02/24/2014  . HYPERTENSION, BENIGN 12/12/2008  . CHEST PAIN-UNSPECIFIED 12/12/2008  . ABNORMAL EKG 12/12/2008   4:08 PM,03/17/2016 Elly Modena PT, DPT [facility] [address] [place], [place], [address] Phone: [phone]   Fax:  [phone]  Name: Garwood Rumbley MRN: [id number] Date of Birth: 07-17-1956

## 2016-03-22 ENCOUNTER — Ambulatory Visit (HOSPITAL_COMMUNITY): Payer: Worker's Compensation

## 2016-03-22 DIAGNOSIS — R293 Abnormal posture: Secondary | ICD-10-CM

## 2016-03-22 DIAGNOSIS — M25512 Pain in left shoulder: Secondary | ICD-10-CM

## 2016-03-22 DIAGNOSIS — M25612 Stiffness of left shoulder, not elsewhere classified: Secondary | ICD-10-CM | POA: Diagnosis not present

## 2016-03-22 DIAGNOSIS — M6281 Muscle weakness (generalized): Secondary | ICD-10-CM

## 2016-03-22 NOTE — Therapy (Signed)
[facility] [address] [place], [place], [address] Phone: [phone]   Fax:  [phone]  Physical Therapy Treatment  Patient Details  Name: [name] Foster MRN: [id number] Date of Birth: Aug 02, 1956 Referring Provider: Jolyn Nap  Encounter Date: 03/22/2016      PT End of Session - 03/22/16 1611    Visit Number 10   Number of Visits 24   Date for PT Re-Evaluation 03/23/16   Authorization Type Worker's Comp (12 pre-approved as of 02/17/16, working on requesting more due to patient status)   Authorization Time Period 02/17/16 to 03/23/16   Authorization - Visit Number 10   Authorization - Number of Visits 24   PT Start Time [id number]   PT Stop Time 1555   PT Time Calculation (min) 49 min   Activity Tolerance Patient tolerated treatment well;Patient limited by pain   Behavior During Therapy [facility] for tasks assessed/performed      Past Medical History  Diagnosis Date  . Benign essential hypertension   . Abnormal EKG   . Tobacco abuse   . Apical variant hypertrophic cardiomyopathy ([place])   . Hypokalemia   . BPH (benign prostatic hyperplasia)     Past Surgical History  Procedure Laterality Date  . Cardiac catheterization      2008  . Kidney surgery Right April 2015    benign tumor removal  . Colonoscopy N/A 02/26/2015    Procedure: COLONOSCOPY;  Surgeon: Rogene Houston, MD;  [facility];  Service: Endoscopy;  Laterality: N/A;  830  . Prostate ablation  12-2015 at [facility]  . Shoulder arthroscopy with rotator cuff repair and subacromial decompression Left 01/18/2016    Procedure: LEFT SHOULDER ARTHROSCOPY WITH ROTATOR CUFF REPAIR AND SUBACROMIAL DECOMPRESSION;  Surgeon: Milly Jakob, MD;  Location: [facility];  Service: Orthopedics;  Laterality: Left;  PRE-OP BLOCK WITH GENERAL ANESTHESIA  . Shoulder acromioplasty Left 01/18/2016    Procedure: SHOULDER ACROMIOPLASTY;  Surgeon: Milly Jakob, MD;  Location:  [facility];  Service: Orthopedics;  Laterality: Left;    There were no vitals filed for this visit.      Subjective Assessment - 03/22/16 1511    Subjective Pt reports increased pain he feels is related to the weather (raining outside)   Pertinent History HTN, tobacco abuse, hx of cardiac cath    Patient Stated Goals get back to job activites (very active)   Currently in Pain? Yes   Pain Score 7    Pain Location --  Lt upper traps   Pain Orientation Left   Pain Descriptors / Indicators Aching  stiffness   Pain Type Surgical pain   Pain Radiating Towards L deltoid insertion   Pain Onset More than a month ago   Pain Frequency Constant   Aggravating Factors  moving/all activity   Pain Relieving Factors pain meds help some   Effect of Pain on Daily Activities ADLs, job            [facility] PT Assessment - 03/22/16 0001    Assessment   Medical Diagnosis L rotator cuff repair    Referring Provider Jolyn Nap   Onset Date/Surgical Date 01/18/16   Next MD Visit June 10th with Dr. [place] Silos    Precautions   Precautions Shoulder   Type of Shoulder Precautions Using Raliegh Ip 2015 protocol    Precaution Comments reports MD did not really give him any specific precaution  [facility] Adult PT Treatment/Exercise - 03/22/16 0001    Neck Exercises: Standing   Other Standing Exercises Viewing posture in mirror to reduce Lt cervical side bending   Shoulder Exercises: Standing   Other Standing Exercises shoulder pendulums 1x60 seconds CW and CCW    Other Standing Exercises standing weight shifts bearing weight as tolerated x2 mintues; anterior glides in standing with UE on table/patient stepping forward 1x20   Moist Heat Therapy   Number Minutes Moist Heat 15 Minutes   Moist Heat Location Cervical;Shoulder  initial session during PROM and STM   Manual Therapy   Manual Therapy Soft tissue mobilization;Passive ROM;Other (comment)    Manual therapy comments performed separately from all other skilled intervetnions    Soft tissue mobilization TrP release L upper trap, scar mobilization   Passive ROM abduction with grade I-II inferior mobs for pain relief; Flexion/ER at 30deg   Other Manual Therapy Position release technqiue for Lt UT 3x 2 min   Neck Exercises: Stretches   Upper Trapezius Stretch 3 reps;30 seconds                  PT Short Term Goals - 02/17/16 1545    PT SHORT TERM GOAL #1   Title Patient to demonstrate at least 100 degrees of L shoulder flexion and abduction in order to assist in reducing pain and improving general function    Time 3   Period Weeks   Status New   PT SHORT TERM GOAL #2   Title Patient to demonstrate 90 degrees L shoulder IR and at least 45 degrees L shoulder ER in order to assist in reducing pain adn improve function    Time 3   Period Weeks   Status New   PT SHORT TERM GOAL #3   Title Patient to demonstrate symmetrical neuromuscular recrutiment of all scapular and shoulder girlde muscular to demosntrate improved function and neuromuscular recruitment for functional task performance    Time 3   Period Weeks   Status New   PT SHORT TERM GOAL #4   Title Patient to experience pain no more than 4/10 L shoulder during all tasks in order to improve task performance and overall QOL    Time 3   Period Weeks   Status New   PT SHORT TERM GOAL #5   Title Patient to be independent in correctly and consistently performing appropriate HEP, to be updated PRN    Time 3   Period Weeks   Status New           PT Long Term Goals - 02/17/16 1548    PT LONG TERM GOAL #1   Title Patient to demonstrate at least 160 degrees L shoulder flexion and ABD in order to improve function and assist in improving overall QOL    Time 6   Period Weeks   Status New   PT LONG TERM GOAL #2   Title Patient to demonstrate L shoulder IR of 90 degrees and L shoulder ER of at least 80 degrees in order  to improve function and assist in improving overall QOL    Time 6   Period Weeks   Status New   PT LONG TERM GOAL #3   Title Patient to demonstrate strength at least 4/5 in all tested muscle groups in order to improve function and reduce pain, improve regional stability    Time 6   Period Weeks   Status New   PT LONG TERM GOAL #4  Title Patient to report he has been able to use both UEs to dress and wash his hair in order to demosntrate improved general function and ability to perform self-care tasks    Time 6   Period Weeks   Status New   PT LONG TERM GOAL #5   Title Patient to report he has been able to return to light duty at work in order to assist in returning to function and improved QOL    Time 6   Period Weeks   Status New               Plan - 03/22/16 1846    Clinical Impression Statement Pt limited by pain with increased muscle guarding today with PROM.  Positive results following position release technqiue for Lt UT with reports of pain reduced from 7-8/10 to 5/10.  Utilized MHP initial session to reduce tightness and increase relaxation for more tolerance with PROM.  Pt educated on importance of improving posture to reduce contraction with UT (tendency to stand with cervical Lt sidebending.)  Feel pt would benefit from modalities such as estim or [place] prior or during manual to reduce muscle guarding and reduce pain to improve tolerance and improving ROM, message has been sent to evaluation PT to add modalities to POC if agrees, F/U next session.   Rehab Potential Good   PT Frequency 3x / week  Worker comp approved 24 sessions, frequency at 3x a week   PT Duration Other (comment)  Worker comp approved 24 sessions, frequency at 3x a week   PT Treatment/Interventions ADLs/Self Care Home Management;Cryotherapy;Moist Heat;Functional mobility training;Therapeutic activities;Therapeutic exercise;Neuromuscular re-education;Patient/family education;Manual techniques;Passive  range of motion;Taping   PT Next Visit Plan go per Raliegh Ip 2015 protocol for rotator cuff (between weeks 4 and 5 as of 5/16); focus on posture awareness, shoulder mobility and correct muscle recruitment of scapular stabilizers.  Use HOB elevated, towel supporting UE.  F/U with evaluation PT to add estim/US to POC if in agreement.        Patient will benefit from skilled therapeutic intervention in order to improve the following deficits and impairments:  Hypomobility, Decreased knowledge of precautions, Decreased strength, Increased fascial restricitons, Impaired UE functional use, Pain, Increased muscle spasms, Improper body mechanics, Decreased coordination, Impaired flexibility, Postural dysfunction  Visit Diagnosis: Stiffness of left shoulder, not elsewhere classified  Pain in left shoulder  Muscle weakness (generalized)  Abnormal posture     Problem List Patient Active Problem List   Diagnosis Date Noted  . Hypokalemia   . Preop cardiovascular exam 02/24/2014  . Apical variant hypertrophic cardiomyopathy ([place]) 02/24/2014  . Tobacco abuse 02/24/2014  . HYPERTENSION, BENIGN 12/12/2008  . CHEST PAIN-UNSPECIFIED 12/12/2008  . ABNORMAL EKG 12/12/2008   Ihor Austin, LPTA; [place]  Aldona Lento 03/22/2016, 6:55 PM  [facility] [address] [place], [place], [address] Phone: [phone]   Fax:  [phone]  Name: Abdulsalam Berdine MRN: [id number] Date of Birth: [birth year]

## 2016-03-24 ENCOUNTER — Ambulatory Visit (HOSPITAL_COMMUNITY): Payer: Worker's Compensation | Admitting: Physical Therapy

## 2016-03-24 DIAGNOSIS — M6281 Muscle weakness (generalized): Secondary | ICD-10-CM

## 2016-03-24 DIAGNOSIS — M25612 Stiffness of left shoulder, not elsewhere classified: Secondary | ICD-10-CM | POA: Diagnosis not present

## 2016-03-24 DIAGNOSIS — M25512 Pain in left shoulder: Secondary | ICD-10-CM

## 2016-03-24 NOTE — Patient Instructions (Signed)
ROM: Flexion - Wand (Supine)    Lie on back holding wand. Raise arms over head.  Repeat __10__ times per set. Do ____ sets per session. Do __3__ sessions per day. 1 http://orth.exer.us/928   Copyright  VHI. All rights reserved.  ROM: Abduction - Wand    Holding wand with left hand palm up, push wand directly out to side, leading with other hand palm down, until stretch is felt. Hold __10__ seconds. Repeat __10__ times per set. Do __1__ sets per session. Do _3___ sessions per day.  http://orth.exer.us/746   Copyright  VHI. All rights reserved.  Strengthening: Isometric Abduction    Using wall for resistance, press left arm into ball using light pressure. Hold _20___ seconds. Repeat _3___ times per set. Do _1___ sets per session. Do __2__ sessions per day.  http://orth.exer.us/806   Copyright  VHI. All rights reserved.  Strengthening: Isometric Adduction    Using body for resistance, gently press right arm into ball using light pressure. Hold _20___ seconds. Repeat ___3_ times per set. Do __1__ sets per session. Do ___2_ sessions per day.  http://orth.exer.us/810   Copyright  VHI. All rights reserved.  Strengthening: Isometric Extension    Using wall for resistance, press back of left arm into ball using light pressure. Hold _20___ seconds. Repeat __3__ times per set. Do ___1_ sets per session. Do 2____ sessions per day.  http://orth.exer.us/804   Copyright  VHI. All rights reserved.  Strengthening: Isometric Flexion    Using wall for resistance, press right fist into ball using light pressure. Hold __20__ seconds. Repeat ___3_ times per set. Do _1___ sets per session. Do2 ____ sessions per day.  http://orth.exer.us/800   Copyright  VHI. All rights reserved.

## 2016-03-24 NOTE — Therapy (Signed)
[place], [place], [address] Phone: [phone]   Fax:  [phone]  Physical Therapy Treatment  Patient Details  Name: [name] Foster MRN: [id number] Date of Birth: [birth year] Referring Provider: Jolyn Nap  Encounter Date: 03/24/2016      PT End of Session - 03/24/16 1611    Visit Number 11   Number of Visits 24   Date for PT Re-Evaluation 03/23/16   Authorization Type Worker's Comp (12 pre-approved as of 02/17/16, working on requesting more due to patient status)   Authorization Time Period 02/17/16 to 03/23/16   Authorization - Visit Number 10   Authorization - Number of Visits 24   PT Start Time 1519   PT Stop Time [id number]   PT Time Calculation (min) 45 min   Activity Tolerance Patient limited by pain      Past Medical History  Diagnosis Date  . Benign essential hypertension   . Abnormal EKG   . Tobacco abuse   . Apical variant hypertrophic cardiomyopathy ([place])   . Hypokalemia   . BPH (benign prostatic hyperplasia)     Past Surgical History  Procedure Laterality Date  . Cardiac catheterization      2008  . Kidney surgery Right April 2015    benign tumor removal  . Colonoscopy N/A 02/26/2015    Procedure: COLONOSCOPY;  Surgeon: Rogene Houston, MD;  [facility];  Service: Endoscopy;  Laterality: N/A;  830  . Prostate ablation  12-2015 at [facility]  . Shoulder arthroscopy with rotator cuff repair and subacromial decompression Left 01/18/2016    Procedure: LEFT SHOULDER ARTHROSCOPY WITH ROTATOR CUFF REPAIR AND SUBACROMIAL DECOMPRESSION;  Surgeon: Milly Jakob, MD;  Location: [place];  Service: Orthopedics;  Laterality: Left;  PRE-OP BLOCK WITH GENERAL ANESTHESIA  . Shoulder acromioplasty Left 01/18/2016    Procedure: SHOULDER ACROMIOPLASTY;  Surgeon: Milly Jakob, MD;  Location: [place];  Service: Orthopedics;  Laterality: Left;    There were no vitals  filed for this visit.      Subjective Assessment - 03/24/16 1524    Subjective Pt states his arm hurts worse with cloudy weather.  He is doing his exercises 3 x a day    Currently in Pain? Yes   Pain Score 6    Pain Location Shoulder   Pain Orientation Left   Pain Descriptors / Indicators Aching              [facility] Adult PT Treatment/Exercise - 03/24/16 0001    Shoulder Exercises: Supine   Other Supine Exercises wand flexion , abduction IR/ER x 10 (done at 0 degrees abdution)   Other Supine Exercises PROM for all motions    Shoulder Exercises: Pulleys   Flexion 1 minute   ABduction 1 minute   Shoulder Exercises: Isometric Strengthening   Flexion 5X10"   Extension 5X10"   Internal Rotation 5X10"   ABduction 5X10"   Manual Therapy   Joint Mobilization inferior, lateral and posterior mobs                PT Education - 03/24/16 1609    Education provided Yes   Education Details importance of obtaining full ROM.  To keep body in proper alighnment during exercies    Person(s) Educated Patient   Methods Explanation   Comprehension Verbalized understanding          PT Short Term Goals - 02/17/16 1545  PT SHORT TERM GOAL #1   Title Patient to demonstrate at least 100 degrees of L shoulder flexion and abduction in order to assist in reducing pain and improving general function    Time 3   Period Weeks   Status New   PT SHORT TERM GOAL #2   Title Patient to demonstrate 90 degrees L shoulder IR and at least 45 degrees L shoulder ER in order to assist in reducing pain adn improve function    Time 3   Period Weeks   Status New   PT SHORT TERM GOAL #3   Title Patient to demonstrate symmetrical neuromuscular recrutiment of all scapular and shoulder girlde muscular to demosntrate improved function and neuromuscular recruitment for functional task performance    Time 3   Period Weeks   Status New   PT SHORT TERM GOAL #4   Title Patient to experience pain no more  than 4/10 L shoulder during all tasks in order to improve task performance and overall QOL    Time 3   Period Weeks   Status New   PT SHORT TERM GOAL #5   Title Patient to be independent in correctly and consistently performing appropriate HEP, to be updated PRN    Time 3   Period Weeks   Status New           PT Long Term Goals - 02/17/16 1548    PT LONG TERM GOAL #1   Title Patient to demonstrate at least 160 degrees L shoulder flexion and ABD in order to improve function and assist in improving overall QOL    Time 6   Period Weeks   Status New   PT LONG TERM GOAL #2   Title Patient to demonstrate L shoulder IR of 90 degrees and L shoulder ER of at least 80 degrees in order to improve function and assist in improving overall QOL    Time 6   Period Weeks   Status New   PT LONG TERM GOAL #3   Title Patient to demonstrate strength at least 4/5 in all tested muscle groups in order to improve function and reduce pain, improve regional stability    Time 6   Period Weeks   Status New   PT LONG TERM GOAL #4   Title Patient to report he has been able to use both UEs to dress and wash his hair in order to demosntrate improved general function and ability to perform self-care tasks    Time 6   Period Weeks   Status New   PT LONG TERM GOAL #5   Title Patient to report he has been able to return to light duty at work in order to assist in returning to function and improved QOL    Time 6   Period Weeks   Status New               Plan - 03/24/16 1612    Clinical Impression Statement Pt is at 10 weeks post of on 03/28/2016.  MD request pt to be three weeks behind normal protoco due to severity of tear.  Pt continues to gaurd against PROM with flexion at 115; Abduction at 80 an ER at 15.  Began isometric exercises today to decrease musculature atrophy.    PT Frequency 3x / week   PT Duration Other (comment)   PT Treatment/Interventions ADLs/Self Care Home  Management;Cryotherapy;Moist Heat;Functional mobility training;Therapeutic activities;Therapeutic exercise;Neuromuscular re-education;Patient/family education;Manual techniques;Passive range of motion;Taping  PT Next Visit Plan Reassess pt; contnue joint mobilization, begin closed chain exercises(hand on table step away, step forward, step back,), Need to really push for ROM as pt should have normal ROM at this time      Patient will benefit from skilled therapeutic intervention in order to improve the following deficits and impairments:  Hypomobility, Decreased knowledge of precautions, Decreased strength, Increased fascial restricitons, Impaired UE functional use, Pain, Increased muscle spasms, Improper body mechanics, Decreased coordination, Impaired flexibility, Postural dysfunction  Visit Diagnosis: Stiffness of left shoulder, not elsewhere classified  Pain in left shoulder  Muscle weakness (generalized)     Problem List Patient Active Problem List   Diagnosis Date Noted  . Hypokalemia   . Preop cardiovascular exam 02/24/2014  . Apical variant hypertrophic cardiomyopathy ([place]) 02/24/2014  . Tobacco abuse 02/24/2014  . HYPERTENSION, BENIGN 12/12/2008  . CHEST PAIN-UNSPECIFIED 12/12/2008  . ABNORMAL EKG 12/12/2008    Rayetta Humphrey, PT CLT [phone] 03/24/2016, 4:18 PM  [facility] [address] [place], [place], [address] Phone: [phone]   Fax:  [phone]  Name: [name] Foster MRN: [id number] Date of Birth: 01/09/1956

## 2016-03-25 ENCOUNTER — Ambulatory Visit (HOSPITAL_COMMUNITY): Payer: Worker's Compensation | Admitting: Physical Therapy

## 2016-03-25 DIAGNOSIS — M6281 Muscle weakness (generalized): Secondary | ICD-10-CM

## 2016-03-25 DIAGNOSIS — M25612 Stiffness of left shoulder, not elsewhere classified: Secondary | ICD-10-CM | POA: Diagnosis not present

## 2016-03-25 DIAGNOSIS — R293 Abnormal posture: Secondary | ICD-10-CM

## 2016-03-25 DIAGNOSIS — M25512 Pain in left shoulder: Secondary | ICD-10-CM

## 2016-03-25 NOTE — Therapy (Signed)
[place] [address] [place], [place], [address] Phone: [phone]   Fax:  [phone]  Physical Therapy Treatment  Patient Details  Name: [name] Foster MRN: [id number] Date of Birth: 1956/08/19 Referring Provider: Jolyn Nap  Encounter Date: 03/25/2016      PT End of Session - 03/25/16 1619    Visit Number 11   Number of Visits 24   Date for PT Re-Evaluation 03/23/16   Authorization Type Worker's Comp (12 pre-approved as of 02/17/16, working on requesting more due to patient status)   Authorization Time Period 02/17/16 to 03/23/16   Authorization - Visit Number 11   Authorization - Number of Visits 24   PT Start Time 1430   PT Stop Time 1512   PT Time Calculation (min) 42 min   Activity Tolerance Patient limited by pain   Behavior During Therapy [facility] for tasks assessed/performed      Past Medical History  Diagnosis Date  . Benign essential hypertension   . Abnormal EKG   . Tobacco abuse   . Apical variant hypertrophic cardiomyopathy ([place])   . Hypokalemia   . BPH (benign prostatic hyperplasia)     Past Surgical History  Procedure Laterality Date  . Cardiac catheterization      2008  . Kidney surgery Right April 2015    benign tumor removal  . Colonoscopy N/A 02/26/2015    Procedure: COLONOSCOPY;  Surgeon: Rogene Houston, MD;  [facility];  Service: Endoscopy;  Laterality: N/A;  830  . Prostate ablation  12-2015 at [facility]  . Shoulder arthroscopy with rotator cuff repair and subacromial decompression Left 01/18/2016    Procedure: LEFT SHOULDER ARTHROSCOPY WITH ROTATOR CUFF REPAIR AND SUBACROMIAL DECOMPRESSION;  Surgeon: Milly Jakob, MD;  Location: [place];  Service: Orthopedics;  Laterality: Left;  PRE-OP BLOCK WITH GENERAL ANESTHESIA  . Shoulder acromioplasty Left 01/18/2016    Procedure: SHOULDER ACROMIOPLASTY;  Surgeon: Milly Jakob, MD;  Location: [place];  Service:  Orthopedics;  Laterality: Left;    There were no vitals filed for this visit.      Subjective Assessment - 03/25/16 1616    Subjective Pt reports he has been doing good. He feels his arm might hurt worse with rainy weather. Other than that, he is doing his HEP daily and feels it has been a slow process. States he was instructed to remove his sling when cleared by the surgeon. Reports 6/10 surgical pain in his Lt shoulder.    Pertinent History HTN, tobacco abuse, hx of cardiac cath    Patient Stated Goals get back to job activites (very active)   Currently in Pain? Yes   Pain Score 6                          [facility] Adult PT Treatment/Exercise - 03/25/16 0001    Shoulder Exercises: Supine   Other Supine Exercises shoulder retraction 10x5 sec hold   Shoulder Exercises: Isometric Strengthening   Flexion 5X10";Supine  submax   Extension 5X10";Supine  submax    Internal Rotation Supine;5X10"  submax   ABduction Supine;5X10"  submax   Manual Therapy   Manual Therapy Soft tissue mobilization;Passive ROM;Other (comment)   Manual therapy comments performed separately from all other skilled intervetnions    Soft tissue mobilization TrP release L upper trap, levator scap, scar mobilization   Passive ROM abduction with grade I-II inferior mobs for  pain relief; Flexion/ER at 30deg   Other Manual Therapy Lt upper trap stretch                PT Education - 03/25/16 1617    Education provided Yes   Education Details reviewed HEP and postural alignment to decreased upper trap muscle spasm   Person(s) Educated Patient   Methods Explanation;Demonstration   Comprehension Need further instruction;Returned demonstration;Verbalized understanding          PT Short Term Goals - 02/17/16 1545    PT SHORT TERM GOAL #1   Title Patient to demonstrate at least 100 degrees of L shoulder flexion and abduction in order to assist in reducing pain and improving general function     Time 3   Period Weeks   Status New   PT SHORT TERM GOAL #2   Title Patient to demonstrate 90 degrees L shoulder IR and at least 45 degrees L shoulder ER in order to assist in reducing pain adn improve function    Time 3   Period Weeks   Status New   PT SHORT TERM GOAL #3   Title Patient to demonstrate symmetrical neuromuscular recrutiment of all scapular and shoulder girlde muscular to demosntrate improved function and neuromuscular recruitment for functional task performance    Time 3   Period Weeks   Status New   PT SHORT TERM GOAL #4   Title Patient to experience pain no more than 4/10 L shoulder during all tasks in order to improve task performance and overall QOL    Time 3   Period Weeks   Status New   PT SHORT TERM GOAL #5   Title Patient to be independent in correctly and consistently performing appropriate HEP, to be updated PRN    Time 3   Period Weeks   Status New           PT Long Term Goals - 02/17/16 1548    PT LONG TERM GOAL #1   Title Patient to demonstrate at least 160 degrees L shoulder flexion and ABD in order to improve function and assist in improving overall QOL    Time 6   Period Weeks   Status New   PT LONG TERM GOAL #2   Title Patient to demonstrate L shoulder IR of 90 degrees and L shoulder ER of at least 80 degrees in order to improve function and assist in improving overall QOL    Time 6   Period Weeks   Status New   PT LONG TERM GOAL #3   Title Patient to demonstrate strength at least 4/5 in all tested muscle groups in order to improve function and reduce pain, improve regional stability    Time 6   Period Weeks   Status New   PT LONG TERM GOAL #4   Title Patient to report he has been able to use both UEs to dress and wash his hair in order to demosntrate improved general function and ability to perform self-care tasks    Time 6   Period Weeks   Status New   PT LONG TERM GOAL #5   Title Patient to report he has been able to return to  light duty at work in order to assist in returning to function and improved QOL    Time 6   Period Weeks   Status New               Plan - 03/25/16 1622  Clinical Impression Statement Pt continues to present with muscle spasm around the Lt shoulder secondary to shoulder shrugging and lateral cervical tilt posture maintained throughout the day. He requires moderate cues throughout the session to maintain upright posture. Rest of the session continued to focus on manual treatment to decrease muscle spasm and improve ROM, however pt with increased guarding during treatment. Will continue with POC to address ROM deficits.   Rehab Potential Good   PT Frequency 3x / week   PT Duration Other (comment)   PT Treatment/Interventions ADLs/Self Care Home Management;Cryotherapy;Moist Heat;Functional mobility training;Therapeutic activities;Therapeutic exercise;Neuromuscular re-education;Patient/family education;Manual techniques;Passive range of motion;Taping   PT Next Visit Plan Reassess pt; contnue joint mobilization, begin closed chain exercises(hand on table step away, step forward, step back,), Need to really push for ROM as pt should have normal ROM at this time   PT Home Exercise Plan no updates this session   Consulted and Agree with Plan of Care Patient      Patient will benefit from skilled therapeutic intervention in order to improve the following deficits and impairments:  Hypomobility, Decreased knowledge of precautions, Decreased strength, Increased fascial restricitons, Impaired UE functional use, Pain, Increased muscle spasms, Improper body mechanics, Decreased coordination, Impaired flexibility, Postural dysfunction  Visit Diagnosis: Stiffness of left shoulder, not elsewhere classified  Pain in left shoulder  Muscle weakness (generalized)  Abnormal posture     Problem List Patient Active Problem List   Diagnosis Date Noted  . Hypokalemia   . Preop cardiovascular exam  02/24/2014  . Apical variant hypertrophic cardiomyopathy ([place]) 02/24/2014  . Tobacco abuse 02/24/2014  . HYPERTENSION, BENIGN 12/12/2008  . CHEST PAIN-UNSPECIFIED 12/12/2008  . ABNORMAL EKG 12/12/2008   4:31 PM,03/25/2016 Elly Modena PT, DPT [facility] [address] [place], [place], [address] Phone: [phone]   Fax:  [phone]  Name: Lian Defrees MRN: [id number] Date of Birth: 08/26/1956

## 2016-03-29 ENCOUNTER — Ambulatory Visit (HOSPITAL_COMMUNITY): Payer: Worker's Compensation | Admitting: Physical Therapy

## 2016-03-29 DIAGNOSIS — M25612 Stiffness of left shoulder, not elsewhere classified: Secondary | ICD-10-CM | POA: Diagnosis not present

## 2016-03-29 DIAGNOSIS — R293 Abnormal posture: Secondary | ICD-10-CM

## 2016-03-29 DIAGNOSIS — M6281 Muscle weakness (generalized): Secondary | ICD-10-CM

## 2016-03-29 DIAGNOSIS — M25512 Pain in left shoulder: Secondary | ICD-10-CM

## 2016-03-29 NOTE — Therapy (Signed)
[facility] [address] [place], [place], [address] Phone: [phone]   Fax:  [phone]  Physical Therapy Treatment (Re-Assessment)  Patient Details  Name: [name] Foster MRN: [id number] Date of Birth: 10/14/1956 Referring Provider: Milly Jakob   Encounter Date: 03/29/2016      PT End of Session - 03/29/16 1607    Visit Number 12   Number of Visits 24   Date for PT Re-Evaluation 04/26/16   Authorization Type Worker's Comp (24 have been approved)   Authorization Time Period 4/62/86 to 3/81/77; recert done on 1/16   Authorization - Visit Number 12   Authorization - Number of Visits 24   PT Start Time 5790   PT Stop Time 1559   PT Time Calculation (min) 42 min   Activity Tolerance Patient limited by pain   Behavior During Therapy [facility] for tasks assessed/performed      Past Medical History  Diagnosis Date  . Benign essential hypertension   . Abnormal EKG   . Tobacco abuse   . Apical variant hypertrophic cardiomyopathy ([place])   . Hypokalemia   . BPH (benign prostatic hyperplasia)     Past Surgical History  Procedure Laterality Date  . Cardiac catheterization      2008  . Kidney surgery Right April 2015    benign tumor removal  . Colonoscopy N/A 02/26/2015    Procedure: COLONOSCOPY;  Surgeon: Rogene Houston, MD;  [facility];  Service: Endoscopy;  Laterality: N/A;  830  . Prostate [facility]  12-2015 at [facility]  . Shoulder arthroscopy with rotator cuff repair and subacromial decompression Left 01/18/2016    Procedure: LEFT SHOULDER ARTHROSCOPY WITH ROTATOR CUFF REPAIR AND SUBACROMIAL DECOMPRESSION;  Surgeon: Milly Jakob, MD;  Location: [facility];  Service: Orthopedics;  Laterality: Left;  PRE-OP BLOCK WITH GENERAL ANESTHESIA  . Shoulder acromioplasty Left 01/18/2016    Procedure: SHOULDER ACROMIOPLASTY;  Surgeon: Milly Jakob, MD;  Location: [place];  Service: Orthopedics;   Laterality: Left;    There were no vitals filed for this visit.      Subjective Assessment - 03/29/16 1518    Subjective Patient reports that he has noticed some changes in his shoulder, however is concerend as his R shoulder has been bothering him as well in the general area of the rotators. In terms of his L shoulder specifically, he reports he is cointinuing to have pain. He states that he would rate himself as being about a 45/100 right now.    Pertinent History HTN, tobacco abuse, hx of cardiac cath    Patient Stated Goals get back to job activites (very active)   Currently in Pain? Yes   Pain Score 6    Pain Location Shoulder   Pain Orientation Right;Left   Pain Descriptors / Indicators Aching   Pain Type Surgical pain   Pain Radiating Towards down L UE    Pain Onset More than a month ago   Pain Frequency Constant   Aggravating Factors  moving/all activity    Pain Relieving Factors pain pills    Effect of Pain on Daily Activities cannot do ADLs or job             [facility] PT Assessment - 03/29/16 0001    Assessment   Medical Diagnosis L rotator cuff repair    Referring Provider Milly Jakob    Onset Date/Surgical Date 01/18/16   Next MD Visit June 10th with Dr. Grandville Silos  Precautions   Precautions Shoulder   Type of Shoulder Precautions Using [facility]p 2015 protocol    Precaution Comments reports MD did not really give him any specific precaution   Balance Screen   Has the patient fallen in the past 6 months Yes   How many times? 1-fall that sent him into surgery    Has the patient had a decrease in activity level because of a fear of falling?  Yes   Is the patient reluctant to leave their home because of a fear of falling?  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, dig,  perform manual labor    Leisure no hobbies    Observation/Other Assessments   Focus on Therapeutic Outcomes (FOTO)  67% limited    Posture/Postural Control   Posture/Postural Control Postural limitations   Postural Limitations Forward head;Rounded Shoulders   AROM   Left Shoulder Flexion --  approximately 95-110 degrees, continues to be pain limited    Left Shoulder ABduction 98 Degrees  pain limited    Left Shoulder Internal Rotation 80 Degrees  pain limited    Left Shoulder External Rotation 16 Degrees  at 30 and 60 degrees from body                      [facility] Adult PT Treatment/Exercise - 03/29/16 0001    Shoulder Exercises: Standing   Other Standing Exercises shoulder pendulums 1x2 minutes CW and CCW                 PT Education - 03/29/16 1606    Education provided Yes   Education Details progress with skilled PT services, POC moving forwards, addition of modalities to plan to assist in improving ROM    Person(s) Educated Patient   Methods Explanation   Comprehension Verbalized understanding          PT Short Term Goals - 03/29/16 1543    PT SHORT TERM GOAL #1   Title Patient to demonstrate at least 100 degrees of L shoulder flexion and abduction in order to assist in reducing pain and improving general function    Baseline 5/30- flexion about 95-110; abduction around 98 degrees  passively    Time 3   Period Weeks   Status Partially Met   PT SHORT TERM GOAL #2   Title Patient to demonstrate 90 degrees L shoulder IR and at least 45 degrees L shoulder ER in order to assist in reducing pain adn improve function    Baseline 5/30- 80 degrees IR, only 16 degrees ER    Time 3   Period Weeks   Status On-going   PT SHORT TERM GOAL #3   Title Patient to demonstrate symmetrical neuromuscular recrutiment of all scapular and shoulder girlde muscular to demosntrate improved function and neuromuscular recruitment for functional task performance    Time 3    Period Weeks   Status Achieved   PT SHORT TERM GOAL #4   Title Patient to experience pain no more than 4/10 L shoulder during all tasks in order to improve task performance and overall QOL    Baseline 5/30- at night it can get up to a 7-8/10   Time 3   Period Weeks   Status On-going   PT SHORT TERM GOAL #5   Title Patient  to be independent in correctly and consistently performing appropriate HEP, to be updated PRN    Baseline 5/30- patient reports good compliance    Time 3   Period Weeks   Status Achieved           PT Long Term Goals - 03/29/16 1546    PT LONG TERM GOAL #1   Title Patient to demonstrate at least 160 degrees L shoulder flexion and ABD in order to improve function and assist in improving overall QOL    Time 6   Period Weeks   Status On-going   PT LONG TERM GOAL #2   Title Patient to demonstrate L shoulder IR of 90 degrees and L shoulder ER of at least 80 degrees in order to improve function and assist in improving overall QOL    Time 6   Period Weeks   Status On-going   PT LONG TERM GOAL #3   Title Patient to demonstrate strength at least 4/5 in all tested muscle groups in order to improve function and reduce pain, improve regional stability    Baseline 5/30- flexion/abduction approximately 2/5, ER approximately 2/5, IR approxiamtey 3+/5    Time 6   Period Weeks   Status On-going   PT LONG TERM GOAL #4   Title Patient to report he has been able to use both UEs to dress and wash his hair in order to demosntrate improved general function and ability to perform self-care tasks    Baseline 5/30- reports that this is still pretty tough    Time 6   Period Weeks   Status On-going   PT LONG TERM GOAL #5   Title Patient to report he has been able to return to light duty at work in order to assist in returning to function and improved QOL    Baseline 5/30- has not returned yet    Time 6   Period Weeks   Status On-going               Plan - 03/29/16  1550    Clinical Impression Statement Re-assessment performed today. Patient reports that his right shoulder has started bothering him as well at this point and he is working on getting his insurance to allow him to get this one exameined by MD right now as well. Upon examinatoin, patient does show some improvmeent in ROM as compared to his baseline in April, however does continue to be quite limted on all planes but espeically ER  with passive motion. He shows improved symmetrical muscle activation with bilateral tasks such as shoulder shrugs and scapular retractions however does display widespread muscle atrophy throughout his shoulder and UE. At ths point recommend ongoing skilled PT services in order to continue to work on functional impairments and progress through approprate protocol, which MD has requested be slowed down by half; will also plan to add in e-stim and ultrasound in order to assist with pain relief strategies with ROM in order to allow PT staff to be more aggresive with this.    Rehab Potential Good   PT Frequency 3x / week   PT Duration 4 weeks   PT Treatment/Interventions ADLs/Self Care Home Management;Cryotherapy;Moist Heat;Functional mobility training;Therapeutic activities;Therapeutic exercise;Neuromuscular re-education;Patient/family education;Manual techniques;Passive range of motion;Taping;Electrical Stimulation;Ultrasound   PT Next Visit Plan contnue joint mobilization, begin closed chain exercises(hand on table step away, step forward, step back,), Need to really push for ROM as pt should have normal ROM at this time. E-stim/US for pain relief  with ROM. Continue isometrics.    PT Home Exercise Plan no updates this session   Consulted and Agree with Plan of Care Patient      Patient will benefit from skilled therapeutic intervention in order to improve the following deficits and impairments:  Hypomobility, Decreased knowledge of precautions, Decreased strength, Increased  fascial restricitons, Impaired UE functional use, Pain, Increased muscle spasms, Improper body mechanics, Decreased coordination, Impaired flexibility, Postural dysfunction  Visit Diagnosis: Stiffness of left shoulder, not elsewhere classified  Pain in left shoulder  Muscle weakness (generalized)  Abnormal posture     Problem List Patient Active Problem List   Diagnosis Date Noted  . Hypokalemia   . Preop cardiovascular exam 02/24/2014  . Apical variant hypertrophic cardiomyopathy ([place]) 02/24/2014  . Tobacco abuse 02/24/2014  . HYPERTENSION, BENIGN 12/12/2008  . CHEST PAIN-UNSPECIFIED 12/12/2008  . ABNORMAL EKG 12/12/2008    Deniece Ree PT, DPT [facility] [address] [place], [place], [address] Phone: [phone]   Fax:  [phone]  Name: [name] Foster MRN: [id number] Date of Birth: [birth year]

## 2016-03-31 ENCOUNTER — Ambulatory Visit (HOSPITAL_COMMUNITY): Payer: Worker's Compensation | Attending: Orthopedic Surgery

## 2016-03-31 DIAGNOSIS — R293 Abnormal posture: Secondary | ICD-10-CM | POA: Insufficient documentation

## 2016-03-31 DIAGNOSIS — M25512 Pain in left shoulder: Secondary | ICD-10-CM

## 2016-03-31 DIAGNOSIS — M6281 Muscle weakness (generalized): Secondary | ICD-10-CM | POA: Insufficient documentation

## 2016-03-31 DIAGNOSIS — M25612 Stiffness of left shoulder, not elsewhere classified: Secondary | ICD-10-CM | POA: Diagnosis not present

## 2016-03-31 NOTE — Therapy (Signed)
[facility], [place], [address] Phone: [phone]   Fax:  [phone]  Physical Therapy Treatment  Patient Details  Name: [name] Foster MRN: [id number] Date of Birth: Aug 03, 1956 Referring Provider: Milly Jakob   Encounter Date: 03/31/2016      PT End of Session - 03/31/16 1451    Visit Number 13   Number of Visits 24   Date for PT Re-Evaluation 04/26/16   Authorization Type Worker's Comp (24 have been approved)   Authorization Time Period 9/62/83 to 6/62/94; recert done on 7/65   Authorization - Visit Number 13   Authorization - Number of Visits 24   PT Start Time 1440   PT Stop Time 4650   PT Time Calculation (min) 47 min   Activity Tolerance Patient limited by pain;Patient tolerated treatment well   Behavior During Therapy [facility] for tasks assessed/performed      Past Medical History  Diagnosis Date  . Benign essential hypertension   . Abnormal EKG   . Tobacco abuse   . Apical variant hypertrophic cardiomyopathy ([place])   . Hypokalemia   . BPH (benign prostatic hyperplasia)     Past Surgical History  Procedure Laterality Date  . Cardiac catheterization      2008  . Kidney surgery Right April 2015    benign tumor removal  . Colonoscopy N/A 02/26/2015    Procedure: COLONOSCOPY;  Surgeon: Rogene Houston, MD;  [facility];  Service: Endoscopy;  Laterality: N/A;  830  . Prostate ablation  12-2015 at [facility]  . Shoulder arthroscopy with rotator cuff repair and subacromial decompression Left 01/18/2016    Procedure: LEFT SHOULDER ARTHROSCOPY WITH ROTATOR CUFF REPAIR AND SUBACROMIAL DECOMPRESSION;  Surgeon: Milly Jakob, MD;  Location: [place];  Service: Orthopedics;  Laterality: Left;  PRE-OP BLOCK WITH GENERAL ANESTHESIA  . Shoulder acromioplasty Left 01/18/2016    Procedure: SHOULDER ACROMIOPLASTY;  Surgeon: Milly Jakob, MD;  Location: [facility];  Service:  Orthopedics;  Laterality: Left;    There were no vitals filed for this visit.      Subjective Assessment - 03/31/16 1449    Subjective Pt stated he has throbbing Lt rotator cuff pain today, pain scale 6-7/10 today   Pertinent History HTN, tobacco abuse, hx of cardiac cath    Patient Stated Goals get back to job activites (very active)   Currently in Pain? Yes   Pain Score 7    Pain Location Shoulder   Pain Orientation Left   Pain Descriptors / Indicators Throbbing   Pain Type Surgical pain   Pain Radiating Towards down Lt UE above elbow   Pain Onset More than a month ago   Pain Frequency Constant   Aggravating Factors  moving/all activity    Pain Relieving Factors pain pills   Effect of Pain on Daily Activities cannot do ADLs or job                 [facility] Adult PT Treatment/Exercise - 03/31/16 0001    Neck Exercises: Supine   Neck Retraction 10 reps   Cervical Rotation 10 reps   Lateral Flexion Left;5 reps   Other Supine Exercise Cueing to stay in neutral to reduce lateral flexion    Modalities   Modalities Moist Heat;Electrical Stimulation   Moist Heat Therapy   Number Minutes Moist Heat 15 Minutes   Moist Heat Location Cervical;Shoulder  During estim at beginning of session  Acupuncturist Location Lt rotator cuff   Electrical Stimulation Action pain control   Electrical Stimulation Parameters IFES intensity level range from 4.7-->5.4   Electrical Stimulation Goals Pain   Manual Therapy   Manual Therapy Soft tissue mobilization;Passive ROM;Other (comment)   Manual therapy comments performed separately from all other skilled intervetnions    Soft tissue mobilization Lt upper traps, levator scapula, scalenes, anterior and medial deltoids and biceps   Passive ROM flexion, abduction and ER with distraction for pain control   Other Manual Therapy Lt upper trap stretch                  PT Short Term Goals - 03/29/16  1543    PT SHORT TERM GOAL #1   Title Patient to demonstrate at least 100 degrees of L shoulder flexion and abduction in order to assist in reducing pain and improving general function    Baseline 5/30- flexion about 95-110; abduction around 98 degrees  passively    Time 3   Period Weeks   Status Partially Met   PT SHORT TERM GOAL #2   Title Patient to demonstrate 90 degrees L shoulder IR and at least 45 degrees L shoulder ER in order to assist in reducing pain adn improve function    Baseline 5/30- 80 degrees IR, only 16 degrees ER    Time 3   Period Weeks   Status On-going   PT SHORT TERM GOAL #3   Title Patient to demonstrate symmetrical neuromuscular recrutiment of all scapular and shoulder girlde muscular to demosntrate improved function and neuromuscular recruitment for functional task performance    Time 3   Period Weeks   Status Achieved   PT SHORT TERM GOAL #4   Title Patient to experience pain no more than 4/10 L shoulder during all tasks in order to improve task performance and overall QOL    Baseline 5/30- at night it can get up to a 7-8/10   Time 3   Period Weeks   Status On-going   PT SHORT TERM GOAL #5   Title Patient to be independent in correctly and consistently performing appropriate HEP, to be updated PRN    Baseline 5/30- patient reports good compliance    Time 3   Period Weeks   Status Achieved           PT Long Term Goals - 03/29/16 1546    PT LONG TERM GOAL #1   Title Patient to demonstrate at least 160 degrees L shoulder flexion and ABD in order to improve function and assist in improving overall QOL    Time 6   Period Weeks   Status On-going   PT LONG TERM GOAL #2   Title Patient to demonstrate L shoulder IR of 90 degrees and L shoulder ER of at least 80 degrees in order to improve function and assist in improving overall QOL    Time 6   Period Weeks   Status On-going   PT LONG TERM GOAL #3   Title Patient to demonstrate strength at least 4/5  in all tested muscle groups in order to improve function and reduce pain, improve regional stability    Baseline 5/30- flexion/abduction approximately 2/5, ER approximately 2/5, IR approxiamtey 3+/5    Time 6   Period Weeks   Status On-going   PT LONG TERM GOAL #4   Title Patient to report he has been able to use both UEs to dress  and wash his hair in order to demosntrate improved general function and ability to perform self-care tasks    Baseline 5/30- reports that this is still pretty tough    Time 6   Period Weeks   Status On-going   PT LONG TERM GOAL #5   Title Patient to report he has been able to return to light duty at work in order to assist in returning to function and improved QOL    Baseline 5/30- has not returned yet    Time 6   Period Weeks   Status On-going               Plan - 03/31/16 1537    Clinical Impression Statement Added estim with MHP initially this session for pain control to improve tolerance with PROM.  Session focus on manual technqiues to reduce trigger points and muscle spasms on Lt upper traps, levator scapula, rhomboids, scalenes, and anterior/medial deltoid and biceps.  PROM complete for flexion, abduction and ER with noted improved tolerance all directions.  Reviewed importance of proper posture to reduce Lt lateral flexion wtih standing and added cervical retraction and rotation to improve cervical movements.  End of session pt reports pain reduce to 4/10 with improve posture noted.     Rehab Potential Good   PT Frequency 3x / week   PT Duration 4 weeks   PT Treatment/Interventions ADLs/Self Care Home Management;Cryotherapy;Moist Heat;Functional mobility training;Therapeutic activities;Therapeutic exercise;Neuromuscular re-education;Patient/family education;Manual techniques;Passive range of motion;Taping;Electrical Stimulation;Ultrasound   PT Next Visit Plan contnue joint mobilization, begin closed chain exercises(hand on table step away, step  forward, step back,), Need to really push for ROM as pt should have normal ROM at this time. E-stim/US for pain relief with ROM. Continue isometrics.    PT Home Exercise Plan Reviewed HEP, no updates today.      Patient will benefit from skilled therapeutic intervention in order to improve the following deficits and impairments:  Hypomobility, Decreased knowledge of precautions, Decreased strength, Increased fascial restricitons, Impaired UE functional use, Pain, Increased muscle spasms, Improper body mechanics, Decreased coordination, Impaired flexibility, Postural dysfunction  Visit Diagnosis: Stiffness of left shoulder, not elsewhere classified  Pain in left shoulder  Muscle weakness (generalized)  Abnormal posture     Problem List Patient Active Problem List   Diagnosis Date Noted  . Hypokalemia   . Preop cardiovascular exam 02/24/2014  . Apical variant hypertrophic cardiomyopathy ([place]) 02/24/2014  . Tobacco abuse 02/24/2014  . HYPERTENSION, BENIGN 12/12/2008  . CHEST PAIN-UNSPECIFIED 12/12/2008  . ABNORMAL EKG 12/12/2008   Ihor Austin, LPTA; [place]  Aldona Lento 03/31/2016, 3:46 PM  [facility] [address] [place], [place], [address] Phone: [phone]   Fax:  [phone]  Name: [name] Foster MRN: [id number] Date of Birth: [birth year]

## 2016-04-01 ENCOUNTER — Ambulatory Visit (HOSPITAL_COMMUNITY): Payer: Worker's Compensation | Admitting: Physical Therapy

## 2016-04-01 ENCOUNTER — Telehealth (HOSPITAL_COMMUNITY): Payer: Self-pay | Admitting: Physical Therapy

## 2016-04-01 NOTE — Telephone Encounter (Signed)
notified pt of missed apt today. States he was told his next apt was Tues. PT reminded him that his next apt is actually Mon at 3:15 ane he confirmed he would be there.   3:49 PM,04/01/2016 Elly Modena PT, [facility] [phone]

## 2016-04-04 ENCOUNTER — Ambulatory Visit (HOSPITAL_COMMUNITY): Payer: Worker's Compensation | Admitting: Physical Therapy

## 2016-04-04 DIAGNOSIS — M25512 Pain in left shoulder: Secondary | ICD-10-CM

## 2016-04-04 DIAGNOSIS — M6281 Muscle weakness (generalized): Secondary | ICD-10-CM

## 2016-04-04 DIAGNOSIS — M25612 Stiffness of left shoulder, not elsewhere classified: Secondary | ICD-10-CM | POA: Diagnosis not present

## 2016-04-04 DIAGNOSIS — R293 Abnormal posture: Secondary | ICD-10-CM

## 2016-04-04 NOTE — Therapy (Signed)
[facility] [place], [place], [address] Phone: [phone]   Fax:  [phone]  Physical Therapy Treatment  Patient Details  Name: [name] Foster MRN: [id number] Date of Birth: 12/19/1955 Referring Provider: Milly Jakob   Encounter Date: 04/04/2016      PT End of Session - 04/04/16 1702    Visit Number 14   Number of Visits 24   Date for PT Re-Evaluation 04/26/16   Authorization Type Worker's Comp (24 have been approved)   Authorization Time Period 2/70/35 to 0/09/38; recert done on 1/82   Authorization - Visit Number 14   Authorization - Number of Visits 24   PT Start Time 9937   PT Stop Time 1601   PT Time Calculation (min) 43 min   Activity Tolerance Patient tolerated treatment well;Patient limited by pain   Behavior During Therapy [facility] for tasks assessed/performed      Past Medical History  Diagnosis Date  . Benign essential hypertension   . Abnormal EKG   . Tobacco abuse   . Apical variant hypertrophic cardiomyopathy ([place])   . Hypokalemia   . BPH (benign prostatic hyperplasia)     Past Surgical History  Procedure Laterality Date  . Cardiac catheterization      2008  . Kidney surgery Right April 2015    benign tumor removal  . Colonoscopy N/A 02/26/2015    Procedure: COLONOSCOPY;  Surgeon: Rogene Houston, MD;  [facility];  Service: Endoscopy;  Laterality: N/A;  830  . Prostate ablation  12-2015 at [facility]  . Shoulder arthroscopy with rotator cuff repair and subacromial decompression Left 01/18/2016    Procedure: LEFT SHOULDER ARTHROSCOPY WITH ROTATOR CUFF REPAIR AND SUBACROMIAL DECOMPRESSION;  Surgeon: Milly Jakob, MD;  Location: [facility];  Service: Orthopedics;  Laterality: Left;  PRE-OP BLOCK WITH GENERAL ANESTHESIA  . Shoulder acromioplasty Left 01/18/2016    Procedure: SHOULDER ACROMIOPLASTY;  Surgeon: Milly Jakob, MD;  Location: [facility];  Service:  Orthopedics;  Laterality: Left;    There were no vitals filed for this visit.      Subjective Assessment - 04/04/16 1518    Subjective Patient arrives today reporting that his R shoulder is still bothering him, he is going to his MD this week to see what they think about his R shoulder too. He is still having difficulty sleeping due to shoulder pain, reports pain along R distal deltoid attachment site when he lays on it.    Pertinent History HTN, tobacco abuse, hx of cardiac cath    Currently in Pain? Yes   Pain Score 5    Pain Location Shoulder   Pain Orientation Left                         [facility] Adult PT Treatment/Exercise - 04/04/16 0001    Shoulder Exercises: Supine   External Rotation Both;10 reps   External Rotation Limitations x6 at 30 degrees, x6 at 60 degrees    Flexion Left;10 reps   Flexion Limitations PROM to approximately 120   ABduction Left;10 reps   ABduction Limitations PROM to approximately 120-130 degrees    Shoulder Exercises: Standing   Other Standing Exercises shoulder pendulums x2 minutes CW and CCW    Other Standing Exercises shoulder isometrics ABD, flexion 1x10 with 2 second holds                 PT Education -  04/04/16 1701    Education provided Yes   Education Details education regarding general anatomy and injury mechanicsm of shoulder complex, ROM today    Person(s) Educated Patient   Methods Explanation   Comprehension Verbalized understanding          PT Short Term Goals - 03/29/16 1543    PT SHORT TERM GOAL #1   Title Patient to demonstrate at least 100 degrees of L shoulder flexion and abduction in order to assist in reducing pain and improving general function    Baseline 5/30- flexion about 95-110; abduction around 98 degrees  passively    Time 3   Period Weeks   Status Partially Met   PT SHORT TERM GOAL #2   Title Patient to demonstrate 90 degrees L shoulder IR and at least 45 degrees L shoulder ER in  order to assist in reducing pain adn improve function    Baseline 5/30- 80 degrees IR, only 16 degrees ER    Time 3   Period Weeks   Status On-going   PT SHORT TERM GOAL #3   Title Patient to demonstrate symmetrical neuromuscular recrutiment of all scapular and shoulder girlde muscular to demosntrate improved function and neuromuscular recruitment for functional task performance    Time 3   Period Weeks   Status Achieved   PT SHORT TERM GOAL #4   Title Patient to experience pain no more than 4/10 L shoulder during all tasks in order to improve task performance and overall QOL    Baseline 5/30- at night it can get up to a 7-8/10   Time 3   Period Weeks   Status On-going   PT SHORT TERM GOAL #5   Title Patient to be independent in correctly and consistently performing appropriate HEP, to be updated PRN    Baseline 5/30- patient reports good compliance    Time 3   Period Weeks   Status Achieved           PT Long Term Goals - 03/29/16 1546    PT LONG TERM GOAL #1   Title Patient to demonstrate at least 160 degrees L shoulder flexion and ABD in order to improve function and assist in improving overall QOL    Time 6   Period Weeks   Status On-going   PT LONG TERM GOAL #2   Title Patient to demonstrate L shoulder IR of 90 degrees and L shoulder ER of at least 80 degrees in order to improve function and assist in improving overall QOL    Time 6   Period Weeks   Status On-going   PT LONG TERM GOAL #3   Title Patient to demonstrate strength at least 4/5 in all tested muscle groups in order to improve function and reduce pain, improve regional stability    Baseline 5/30- flexion/abduction approximately 2/5, ER approximately 2/5, IR approxiamtey 3+/5    Time 6   Period Weeks   Status On-going   PT LONG TERM GOAL #4   Title Patient to report he has been able to use both UEs to dress and wash his hair in order to demosntrate improved general function and ability to perform self-care  tasks    Baseline 5/30- reports that this is still pretty tough    Time 6   Period Weeks   Status On-going   PT LONG TERM GOAL #5   Title Patient to report he has been able to return to light duty at work in order  to assist in returning to function and improved QOL    Baseline 5/30- has not returned yet    Time 6   Period Weeks   Status On-going               Plan - 04/04/16 1545    Clinical Impression Statement Focused on ROM this session today, with shulder oscillation/distraction today as well as use of ice to distract from pain during ROM activities. Able to reach approximately 120 degrees flexion and ABD today, ER also appears improved at both 30 and 60 degree positions hwoever does remain qutie limited. Also performed pendulums for pain relief and continued to work on isometic shoulder muscle activation in order to combat muscle atrophy that has occurred through quite a bit of L UE. Will continue to focus on ROM and wil progress more to strengthening and AAROM activities as ROM becomes more normalized.  Educated patient today ergarding general structure of shoulder as well as anatomy of region normally and with injury.    Rehab Potential Good   PT Frequency 3x / week   PT Duration 4 weeks   PT Treatment/Interventions ADLs/Self Care Home Management;Cryotherapy;Moist Heat;Functional mobility training;Therapeutic activities;Therapeutic exercise;Neuromuscular re-education;Patient/family education;Manual techniques;Passive range of motion;Taping;Electrical Stimulation;Ultrasound   PT Next Visit Plan contnue joint mobilization, begin closed chain exercises(hand on table step away, step forward, step back,), Need to really push for ROM as pt should have normal ROM at this time. E-stim/US for pain relief with ROM. Continue isometrics.    PT Home Exercise Plan Reviewed HEP, no updates today.   Consulted and Agree with Plan of Care Patient      Patient will benefit from skilled  therapeutic intervention in order to improve the following deficits and impairments:  Hypomobility, Decreased knowledge of precautions, Decreased strength, Increased fascial restricitons, Impaired UE functional use, Pain, Increased muscle spasms, Improper body mechanics, Decreased coordination, Impaired flexibility, Postural dysfunction  Visit Diagnosis: Stiffness of left shoulder, not elsewhere classified  Pain in left shoulder  Muscle weakness (generalized)  Abnormal posture     Problem List Patient Active Problem List   Diagnosis Date Noted  . Hypokalemia   . Preop cardiovascular exam 02/24/2014  . Apical variant hypertrophic cardiomyopathy ([place]) 02/24/2014  . Tobacco abuse 02/24/2014  . HYPERTENSION, BENIGN 12/12/2008  . CHEST PAIN-UNSPECIFIED 12/12/2008  . ABNORMAL EKG 12/12/2008    Deniece Ree PT, DPT [facility] [address] [place], [place], [address] Phone: [phone]   Fax:  [phone]  Name: [name] Foster MRN: [id number] Date of Birth: 23-Jul-1956

## 2016-04-06 ENCOUNTER — Ambulatory Visit (HOSPITAL_COMMUNITY): Payer: Worker's Compensation

## 2016-04-08 ENCOUNTER — Ambulatory Visit (HOSPITAL_COMMUNITY): Payer: Worker's Compensation

## 2016-04-08 DIAGNOSIS — M25512 Pain in left shoulder: Secondary | ICD-10-CM

## 2016-04-08 DIAGNOSIS — R293 Abnormal posture: Secondary | ICD-10-CM

## 2016-04-08 DIAGNOSIS — M25612 Stiffness of left shoulder, not elsewhere classified: Secondary | ICD-10-CM | POA: Diagnosis not present

## 2016-04-08 DIAGNOSIS — M6281 Muscle weakness (generalized): Secondary | ICD-10-CM

## 2016-04-08 NOTE — Therapy (Signed)
[facility] [place], [place], [address] Phone: [phone]   Fax:  [phone]  Physical Therapy Treatment  Patient Details  Name: [name] Foster MRN: [id number] Date of Birth: 1956-08-06 Referring Provider: Milly Jakob   Encounter Date: 04/08/2016      PT End of Session - 04/08/16 1532    Visit Number 15   Number of Visits 24   Date for PT Re-Evaluation 04/26/16   Authorization Type Worker's Comp (24 have been approved)  04/07/2016- 12 more visits have been authorized   Authorization Time Period 0/60/04 to 5/99/77; recert done on 4/14   Authorization - Visit Number 15   Authorization - Number of Visits 24   PT Start Time 2395   PT Stop Time 1615   PT Time Calculation (min) 54 min   Activity Tolerance Patient tolerated treatment well;Patient limited by pain   Behavior During Therapy [facility] for tasks assessed/performed      Past Medical History  Diagnosis Date  . Benign essential hypertension   . Abnormal EKG   . Tobacco abuse   . Apical variant hypertrophic cardiomyopathy ([place])   . Hypokalemia   . BPH (benign prostatic hyperplasia)     Past Surgical History  Procedure Laterality Date  . Cardiac catheterization      2008  . Kidney surgery Right April 2015    benign tumor removal  . Colonoscopy N/A 02/26/2015    Procedure: COLONOSCOPY;  Surgeon: Rogene Houston, MD;  [facility];  Service: Endoscopy;  Laterality: N/A;  830  . Prostate ablation  12-2015 at [facility]  . Shoulder arthroscopy with rotator cuff repair and subacromial decompression Left 01/18/2016    Procedure: LEFT SHOULDER ARTHROSCOPY WITH ROTATOR CUFF REPAIR AND SUBACROMIAL DECOMPRESSION;  Surgeon: Milly Jakob, MD;  Location: [facility];  Service: Orthopedics;  Laterality: Left;  PRE-OP BLOCK WITH GENERAL ANESTHESIA  . Shoulder acromioplasty Left 01/18/2016    Procedure: SHOULDER ACROMIOPLASTY;  Surgeon: Milly Jakob, MD;   Location: [facility];  Service: Orthopedics;  Laterality: Left;    There were no vitals filed for this visit.      Subjective Assessment - 04/08/16 1529    Subjective Pt arrived without sling, stated MD instructed to begin weening out of sling.  Current pain scale 5/10 Lt neck/shoulder, achey pain.  Reports plans to RTW on [place].   Pertinent History HTN, tobacco abuse, hx of cardiac cath    Patient Stated Goals get back to job activites (very active)   Currently in Pain? Yes   Pain Score 5    Pain Location Shoulder   Pain Orientation Left   Pain Descriptors / Indicators Aching   Pain Type Surgical pain   Pain Radiating Towards down Lt UE above elbow   Pain Onset More than a month ago   Pain Frequency Constant   Aggravating Factors  moving all activity   Pain Relieving Factors pain pills   Effect of Pain on Daily Activities cannot do ADLs or job              [facility] Adult PT Treatment/Exercise - 04/08/16 0001    Shoulder Exercises: Supine   External Rotation PROM;AAROM;Left;Limitations   External Rotation Limitations PROM 10x within pt tolerance; AAROM with rod   Flexion PROM;AAROM  PROM 10x within pt tolerance; AAROM with rod   Flexion Limitations PROM 10x within pt tolerance; AAROM with rod   ABduction PROM;AAROM;Left;10 reps  ABduction Limitations PROM 10x within pt tolerance; AAROM with rod   Modalities   Modalities Electrical Stimulation   Moist Heat Therapy   Number Minutes Moist Heat 15 Minutes   Moist Heat Location Cervical;Shoulder   Electrical Stimulation   Electrical Stimulation Location Lt rotator cuff   Electrical Stimulation Action pain control   Electrical Stimulation Parameters IFES intensity level range   Electrical Stimulation Goals Pain   Manual Therapy   Manual Therapy Soft tissue mobilization;Passive ROM;Other (comment)   Manual therapy comments performed separately from all other skilled intervetnions    Soft tissue mobilization  Lt upper traps, levator scapula, scalenes, anterior and medial deltoids and biceps   Myofascial Release L distal anterior detoild, painful   Passive ROM flexion, abduction and ER with distraction for pain control   Other Manual Therapy Lt upper trap stretch                  PT Short Term Goals - 03/29/16 1543    PT SHORT TERM GOAL #1   Title Patient to demonstrate at least 100 degrees of L shoulder flexion and abduction in order to assist in reducing pain and improving general function    Baseline 5/30- flexion about 95-110; abduction around 98 degrees  passively    Time 3   Period Weeks   Status Partially Met   PT SHORT TERM GOAL #2   Title Patient to demonstrate 90 degrees L shoulder IR and at least 45 degrees L shoulder ER in order to assist in reducing pain adn improve function    Baseline 5/30- 80 degrees IR, only 16 degrees ER    Time 3   Period Weeks   Status On-going   PT SHORT TERM GOAL #3   Title Patient to demonstrate symmetrical neuromuscular recrutiment of all scapular and shoulder girlde muscular to demosntrate improved function and neuromuscular recruitment for functional task performance    Time 3   Period Weeks   Status Achieved   PT SHORT TERM GOAL #4   Title Patient to experience pain no more than 4/10 L shoulder during all tasks in order to improve task performance and overall QOL    Baseline 5/30- at night it can get up to a 7-8/10   Time 3   Period Weeks   Status On-going   PT SHORT TERM GOAL #5   Title Patient to be independent in correctly and consistently performing appropriate HEP, to be updated PRN    Baseline 5/30- patient reports good compliance    Time 3   Period Weeks   Status Achieved           PT Long Term Goals - 03/29/16 1546    PT LONG TERM GOAL #1   Title Patient to demonstrate at least 160 degrees L shoulder flexion and ABD in order to improve function and assist in improving overall QOL    Time 6   Period Weeks   Status  On-going   PT LONG TERM GOAL #2   Title Patient to demonstrate L shoulder IR of 90 degrees and L shoulder ER of at least 80 degrees in order to improve function and assist in improving overall QOL    Time 6   Period Weeks   Status On-going   PT LONG TERM GOAL #3   Title Patient to demonstrate strength at least 4/5 in all tested muscle groups in order to improve function and reduce pain, improve regional stability    Baseline  5/30- flexion/abduction approximately 2/5, ER approximately 2/5, IR approxiamtey 3+/5    Time 6   Period Weeks   Status On-going   PT LONG TERM GOAL #4   Title Patient to report he has been able to use both UEs to dress and wash his hair in order to demosntrate improved general function and ability to perform self-care tasks    Baseline 5/30- reports that this is still pretty tough    Time 6   Period Weeks   Status On-going   PT LONG TERM GOAL #5   Title Patient to report he has been able to return to light duty at work in order to assist in returning to function and improved QOL    Baseline 5/30- has not returned yet    Time 6   Period Weeks   Status On-going               Plan - 04/08/16 1606    Clinical Impression Statement Received order to continues OPPT 1-2x/week as needed to continue PROM and begin AAROM.  Pt with increased pain this session with reports of no pain medication due to taking steroids on day 2 out of 12 today.  Began session with IFES for pain reduction to improve tolerance with PROM today.  Decreased tolerance this session with abilty to flexion and abduction to approximately 105-110 degrees prior compensation with body movements and continues to be limited with ER.  Began AAROM with rod per MD order.  Pt improving posture with less cueing required for Lt cervical side bending pt stated he feels related to not wearing sling this session.  MHP applied to shoulder EOS for pain control, pt reports decreased throbbing pain scale from 6.5/10 to  5/10 more achey.   Rehab Potential Good   PT Frequency 3x / week   PT Duration 4 weeks   PT Next Visit Plan contnue joint mobilization, begin closed chain exercises(hand on table step away, step forward, step back,), Need to really push for ROM as pt should have normal ROM at this time. E-stim/US for pain relief with ROM.    PT Home Exercise Plan no updates this session      Patient will benefit from skilled therapeutic intervention in order to improve the following deficits and impairments:  Hypomobility, Decreased knowledge of precautions, Decreased strength, Increased fascial restricitons, Impaired UE functional use, Pain, Increased muscle spasms, Improper body mechanics, Decreased coordination, Impaired flexibility, Postural dysfunction  Visit Diagnosis: Stiffness of left shoulder, not elsewhere classified  Pain in left shoulder  Muscle weakness (generalized)  Abnormal posture     Problem List Patient Active Problem List   Diagnosis Date Noted  . Hypokalemia   . Preop cardiovascular exam 02/24/2014  . Apical variant hypertrophic cardiomyopathy ([place]) 02/24/2014  . Tobacco abuse 02/24/2014  . HYPERTENSION, BENIGN 12/12/2008  . CHEST PAIN-UNSPECIFIED 12/12/2008  . ABNORMAL EKG 12/12/2008   Ihor Austin, LPTA; [facility]  Aldona Lento 04/08/2016, 4:41 PM  [facility] [address] [place], [place], [address] Phone: [phone]   Fax:  [phone]  Name: [name] Foster MRN: [id number] Date of Birth: 1956/10/23

## 2016-04-11 ENCOUNTER — Ambulatory Visit (HOSPITAL_COMMUNITY): Payer: Worker's Compensation | Admitting: Physical Therapy

## 2016-04-11 DIAGNOSIS — M25512 Pain in left shoulder: Secondary | ICD-10-CM

## 2016-04-11 DIAGNOSIS — M6281 Muscle weakness (generalized): Secondary | ICD-10-CM

## 2016-04-11 DIAGNOSIS — M25612 Stiffness of left shoulder, not elsewhere classified: Secondary | ICD-10-CM

## 2016-04-11 DIAGNOSIS — R293 Abnormal posture: Secondary | ICD-10-CM

## 2016-04-11 NOTE — Therapy (Signed)
[place], [place], [address] Phone: [phone]   Fax:  [phone]  Physical Therapy Treatment  Patient Details  Name: [name] Foster MRN: [id number] Date of Birth: October 24, 1956 Referring Provider: Milly Jakob   Encounter Date: 04/11/2016      PT End of Session - 04/11/16 1614    Visit Number 16   Number of Visits 24   Date for PT Re-Evaluation 04/26/16   Authorization Type Worker's Comp (24 have been approved)  04/07/2016- 12 more visits have been authorized   Authorization Time Period 6/38/93 to 7/34/28; recert done on 7/68   Authorization - Visit Number 16   Authorization - Number of Visits 24   PT Start Time 1157   PT Stop Time 1558   PT Time Calculation (min) 40 min   Activity Tolerance Patient tolerated treatment well;Patient limited by pain   Behavior During Therapy [facility] for tasks assessed/performed      Past Medical History  Diagnosis Date  . Benign essential hypertension   . Abnormal EKG   . Tobacco abuse   . Apical variant hypertrophic cardiomyopathy ([place])   . Hypokalemia   . BPH (benign prostatic hyperplasia)     Past Surgical History  Procedure Laterality Date  . Cardiac catheterization      2008  . Kidney surgery Right April 2015    benign tumor removal  . Colonoscopy N/A 02/26/2015    Procedure: COLONOSCOPY;  Surgeon: Rogene Houston, MD;  [facility];  Service: Endoscopy;  Laterality: N/A;  830  . Prostate ablation  12-2015 at [facility]  . Shoulder arthroscopy with rotator cuff repair and subacromial decompression Left 01/18/2016    Procedure: LEFT SHOULDER ARTHROSCOPY WITH ROTATOR CUFF REPAIR AND SUBACROMIAL DECOMPRESSION;  Surgeon: Milly Jakob, MD;  Location: [place];  Service: Orthopedics;  Laterality: Left;  PRE-OP BLOCK WITH GENERAL ANESTHESIA  . Shoulder acromioplasty Left 01/18/2016    Procedure: SHOULDER ACROMIOPLASTY;  Surgeon: Milly Jakob, MD;   Location: [place];  Service: Orthopedics;  Laterality: Left;    There were no vitals filed for this visit.      Subjective Assessment - 04/11/16 1521    Subjective Patient arrived without sling, reports taht he started light duty at work but is unable to pick up more than a paper/pencil. His MD put him on steroids to assist in recovery, but states that he is not able to take his regular pain medicine along with this. 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 He states taht workers comp has told him they are OK with extending PT.    Pertinent History HTN, tobacco abuse, hx of cardiac cath    Patient Stated Goals get back to job activites (very active)   Currently in Pain? Yes   Pain Score 5    Pain Location Shoulder   Pain Orientation Left                         [facility] Adult PT Treatment/Exercise - 04/11/16 0001    Shoulder Exercises: Supine   External Rotation PROM;Left;12 reps   External Rotation Limitations x10 AAROM supine with rod, cues for form    Flexion PROM;10 reps;Other (comment)  to tolerance    Flexion Limitations x10 AAROM supine with rod, cues for form    ABduction PROM;Left;10 reps;Other (comment)  PROM to tolerance  ABduction Limitations x10 AAROM supine with rod, cues for form    Shoulder Exercises: Seated   Flexion AAROM;Left;10 reps   Flexion Limitations at pulleys    Other Seated Exercises seated flexion and ABD stretches at table with towel 5x10 seconds each    Shoulder Exercises: Standing   Other Standing Exercises shoulder pendulums x2 minutes CW and CCW                 PT Education - 04/11/16 1613    Education provided No          PT Short Term Goals - 03/29/16 1543    PT SHORT TERM GOAL #1   Title Patient to demonstrate at least 100 degrees of L shoulder flexion and abduction in order to assist in reducing pain and improving general function    Baseline  5/30- flexion about 95-110; abduction around 98 degrees  passively    Time 3   Period Weeks   Status Partially Met   PT SHORT TERM GOAL #2   Title Patient to demonstrate 90 degrees L shoulder IR and at least 45 degrees L shoulder ER in order to assist in reducing pain adn improve function    Baseline 5/30- 80 degrees IR, only 16 degrees ER    Time 3   Period Weeks   Status On-going   PT SHORT TERM GOAL #3   Title Patient to demonstrate symmetrical neuromuscular recrutiment of all scapular and shoulder girlde muscular to demosntrate improved function and neuromuscular recruitment for functional task performance    Time 3   Period Weeks   Status Achieved   PT SHORT TERM GOAL #4   Title Patient to experience pain no more than 4/10 L shoulder during all tasks in order to improve task performance and overall QOL    Baseline 5/30- at night it can get up to a 7-8/10   Time 3   Period Weeks   Status On-going   PT SHORT TERM GOAL #5   Title Patient to be independent in correctly and consistently performing appropriate HEP, to be updated PRN    Baseline 5/30- patient reports good compliance    Time 3   Period Weeks   Status Achieved           PT Long Term Goals - 03/29/16 1546    PT LONG TERM GOAL #1   Title Patient to demonstrate at least 160 degrees L shoulder flexion and ABD in order to improve function and assist in improving overall QOL    Time 6   Period Weeks   Status On-going   PT LONG TERM GOAL #2   Title Patient to demonstrate L shoulder IR of 90 degrees and L shoulder ER of at least 80 degrees in order to improve function and assist in improving overall QOL    Time 6   Period Weeks   Status On-going   PT LONG TERM GOAL #3   Title Patient to demonstrate strength at least 4/5 in all tested muscle groups in order to improve function and reduce pain, improve regional stability    Baseline 5/30- flexion/abduction approximately 2/5, ER approximately 2/5, IR approxiamtey 3+/5     Time 6   Period Weeks   Status On-going   PT LONG TERM GOAL #4   Title Patient to report he has been able to use both UEs to dress and wash his hair in order to demosntrate improved general function and ability to perform self-care tasks  Baseline 5/30- reports that this is still pretty tough    Time 6   Period Weeks   Status On-going   PT LONG TERM GOAL #5   Title Patient to report he has been able to return to light duty at work in order to assist in returning to function and improved QOL    Baseline 5/30- has not returned yet    Time 6   Period Weeks   Status On-going               Plan - 04/11/16 1614    Clinical Impression Statement Continued focus on PROM and AAROM today per MD order that was delivered last session. Patient more pain limited during today's session as patient reports he is unable to take regular pain medications due to taking course of steroids. Min-Mod cues for form with AAROM especially during ER motion, required cues and tactile work to correct today. Introduced flexion AAROM at pulleys in chair to patient tolerance today. Patient did require education and cues for correct performance of AAROM, as he tends to do passively rather than true AAROM during today's session. Offered ice to assist in pain control, however patient declined today- advised patient to ice at home however.  ROM continues to be approximately 110 degrees for flexion and ABD passively today.    Rehab Potential Good   PT Frequency 3x / week   PT Duration 4 weeks   PT Treatment/Interventions ADLs/Self Care Home Management;Cryotherapy;Moist Heat;Functional mobility training;Therapeutic activities;Therapeutic exercise;Neuromuscular re-education;Patient/family education;Manual techniques;Passive range of motion;Taping;Electrical Stimulation;Ultrasound   PT Next Visit Plan PROM, AAROM, joint mobilization, shoulder manual mobilization, soft tissue work PRN. E-stim/US PRN for pain. Follow up to  see if case worker confirmed approving more appointments.    PT Home Exercise Plan no updates this session   Consulted and Agree with Plan of Care Patient      Patient will benefit from skilled therapeutic intervention in order to improve the following deficits and impairments:  Hypomobility, Decreased knowledge of precautions, Decreased strength, Increased fascial restricitons, Impaired UE functional use, Pain, Increased muscle spasms, Improper body mechanics, Decreased coordination, Impaired flexibility, Postural dysfunction  Visit Diagnosis: Stiffness of left shoulder, not elsewhere classified  Pain in left shoulder  Muscle weakness (generalized)  Abnormal posture     Problem List Patient Active Problem List   Diagnosis Date Noted  . Hypokalemia   . Preop cardiovascular exam 02/24/2014  . Apical variant hypertrophic cardiomyopathy ([place]) 02/24/2014  . Tobacco abuse 02/24/2014  . HYPERTENSION, BENIGN 12/12/2008  . CHEST PAIN-UNSPECIFIED 12/12/2008  . ABNORMAL EKG 12/12/2008    Deniece Ree PT, DPT [place] [address] [place], [place], [address] Phone: [phone]   Fax:  [phone]  Name: [name] Foster MRN: [id number] Date of Birth: [birth year]

## 2016-04-13 ENCOUNTER — Ambulatory Visit (HOSPITAL_COMMUNITY): Payer: Worker's Compensation | Admitting: Physical Therapy

## 2016-04-13 DIAGNOSIS — M25612 Stiffness of left shoulder, not elsewhere classified: Secondary | ICD-10-CM | POA: Diagnosis not present

## 2016-04-13 DIAGNOSIS — R293 Abnormal posture: Secondary | ICD-10-CM

## 2016-04-13 DIAGNOSIS — M6281 Muscle weakness (generalized): Secondary | ICD-10-CM

## 2016-04-13 DIAGNOSIS — M25512 Pain in left shoulder: Secondary | ICD-10-CM

## 2016-04-13 NOTE — Therapy (Addendum)
[place], [place], [address] Phone: [phone]   Fax:  [phone]  Physical Therapy Treatment  Patient Details  Name: [name] Foster MRN: [id number] Date of Birth: [birth year] Referring Provider: Milly Jakob   Encounter Date: 04/13/2016      PT End of Session - 04/13/16 1720    Visit Number 17   Number of Visits 24   Date for PT Re-Evaluation 04/26/16   Authorization Type Worker's Comp (36 have been approved)  04/07/2016- 12 more visits have been authorized; week of 6/12- 12 more visits authorized    Authorization Time Period 9/92/42 to 6/83/41; recert done on 9/62   Authorization - Visit Number 65   Authorization - Number of Visits 36   PT Start Time 1520   PT Stop Time 1558   PT Time Calculation (min) 38 min   Activity Tolerance Patient tolerated treatment well;Patient limited by pain   Behavior During Therapy [facility] for tasks assessed/performed      Past Medical History  Diagnosis Date  . Benign essential hypertension   . Abnormal EKG   . Tobacco abuse   . Apical variant hypertrophic cardiomyopathy ([place])   . Hypokalemia   . BPH (benign prostatic hyperplasia)     Past Surgical History  Procedure Laterality Date  . Cardiac catheterization      2008  . Kidney surgery Right April 2015    benign tumor removal  . Colonoscopy N/A 02/26/2015    Procedure: COLONOSCOPY;  Surgeon: Rogene Houston, MD;  [facility];  Service: Endoscopy;  Laterality: N/A;  830  . Prostate ablation  12-2015 at [facility]  . Shoulder arthroscopy with rotator cuff repair and subacromial decompression Left 01/18/2016    Procedure: LEFT SHOULDER ARTHROSCOPY WITH ROTATOR CUFF REPAIR AND SUBACROMIAL DECOMPRESSION;  Surgeon: Milly Jakob, MD;  Location: [place];  Service: Orthopedics;  Laterality: Left;  PRE-OP BLOCK WITH GENERAL ANESTHESIA  . Shoulder acromioplasty Left 01/18/2016    Procedure: SHOULDER  ACROMIOPLASTY;  Surgeon: Milly Jakob, MD;  Location: [place];  Service: Orthopedics;  Laterality: Left;    There were no vitals filed for this visit.      Subjective Assessment - 04/13/16 1520    Subjective Patient arrives reporting that things are not going very well at his job, reports they keep asking him to do things that he feels are inappropriate and not safe for him to perform within the precautions that MD gave him. He states that he is going to talk to the workers comp. He is having muscle burning going up into his neck since last session, states he has not been taking pain medicine as he thought that MD said not to.    Pertinent History HTN, tobacco abuse, hx of cardiac cath    Patient Stated Goals get back to job activites (very active)   Currently in Pain? Yes   Pain Score 6    Pain Location Shoulder   Pain Orientation Left                         [facility] Adult PT Treatment/Exercise - 04/13/16 0001    Shoulder Exercises: Supine   External Rotation PROM;Left;12 reps   External Rotation Limitations x10 AAROM   Flexion PROM;10 reps;Other (comment)   Flexion Limitations x10 AAROM   ABduction PROM;Left;10 reps;Other (comment)   ABduction Limitations x10 AAROM   Shoulder Exercises: Seated  Flexion AAROM;Left;15 reps   Flexion Limitations at pulleys    Shoulder Exercises: Standing   Other Standing Exercises shoulder pendulums x2 minutes CW and CCW    Manual Therapy   Manual Therapy Joint mobilization   Manual therapy comments performed separately from all other skilled intervetnions    Joint Mobilization inferior grade 3, 4 rounds                 PT Education - 04/13/16 1719    Education provided Yes   Education Details MD states that patient can take pain medication such as percocet or norco with steriods, just not NSAIDS; encouraged patient to call MD offiice/speak to nurse to confirm which specific medication it is he cannot  take; education regarding current status versus typical course of recovery post surgery    Person(s) Educated Patient   Methods Explanation   Comprehension Verbalized understanding          PT Short Term Goals - 03/29/16 1543    PT SHORT TERM GOAL #1   Title Patient to demonstrate at least 100 degrees of L shoulder flexion and abduction in order to assist in reducing pain and improving general function    Baseline 5/30- flexion about 95-110; abduction around 98 degrees  passively    Time 3   Period Weeks   Status Partially Met   PT SHORT TERM GOAL #2   Title Patient to demonstrate 90 degrees L shoulder IR and at least 45 degrees L shoulder ER in order to assist in reducing pain adn improve function    Baseline 5/30- 80 degrees IR, only 16 degrees ER    Time 3   Period Weeks   Status On-going   PT SHORT TERM GOAL #3   Title Patient to demonstrate symmetrical neuromuscular recrutiment of all scapular and shoulder girlde muscular to demosntrate improved function and neuromuscular recruitment for functional task performance    Time 3   Period Weeks   Status Achieved   PT SHORT TERM GOAL #4   Title Patient to experience pain no more than 4/10 L shoulder during all tasks in order to improve task performance and overall QOL    Baseline 5/30- at night it can get up to a 7-8/10   Time 3   Period Weeks   Status On-going   PT SHORT TERM GOAL #5   Title Patient to be independent in correctly and consistently performing appropriate HEP, to be updated PRN    Baseline 5/30- patient reports good compliance    Time 3   Period Weeks   Status Achieved           PT Long Term Goals - 03/29/16 1546    PT LONG TERM GOAL #1   Title Patient to demonstrate at least 160 degrees L shoulder flexion and ABD in order to improve function and assist in improving overall QOL    Time 6   Period Weeks   Status On-going   PT LONG TERM GOAL #2   Title Patient to demonstrate L shoulder IR of 90 degrees  and L shoulder ER of at least 80 degrees in order to improve function and assist in improving overall QOL    Time 6   Period Weeks   Status On-going   PT LONG TERM GOAL #3   Title Patient to demonstrate strength at least 4/5 in all tested muscle groups in order to improve function and reduce pain, improve regional stability    Baseline  5/30- flexion/abduction approximately 2/5, ER approximately 2/5, IR approxiamtey 3+/5    Time 6   Period Weeks   Status On-going   PT LONG TERM GOAL #4   Title Patient to report he has been able to use both UEs to dress and wash his hair in order to demosntrate improved general function and ability to perform self-care tasks    Baseline 5/30- reports that this is still pretty tough    Time 6   Period Weeks   Status On-going   PT LONG TERM GOAL #5   Title Patient to report he has been able to return to light duty at work in order to assist in returning to function and improved QOL    Baseline 5/30- has not returned yet    Time 6   Period Weeks   Status On-going               Plan - 04/13/16 1721    Clinical Impression Statement Continued focus on AAROM and PROM, also re-introduced shoulder joint mobilzations today; patient contineus to be pain limited however was educated today that MD says he can take medications such as norco or percocet, not just NSAIDs- encouraged patient to call MD office/nurse to confirm which medication he needs to avoid taking. Patient continues to be limited in ROM, at approximately 110 degrees flexion and ABD, and perhaps approximately 30-40 degrees ER at 30 degrees from body today. Re-introduced shoulder joint mobilizations. Patient appears to have poor understanding of general course of recovery after his specfific surgery and was educated regarding general course of recovery versus his current status.    Rehab Potential Good   PT Frequency 3x / week   PT Duration 4 weeks   PT Treatment/Interventions ADLs/Self Care Home  Management;Cryotherapy;Moist Heat;Functional mobility training;Therapeutic activities;Therapeutic exercise;Neuromuscular re-education;Patient/family education;Manual techniques;Passive range of motion;Taping;Electrical Stimulation;Ultrasound   PT Next Visit Plan PROM, AAROM, joint mobilization, shoulder manual mobilization, soft tissue work PRN. E-stim/US PRN for pain. MET for shoulder ROM.    Consulted and Agree with Plan of Care Patient      Patient will benefit from skilled therapeutic intervention in order to improve the following deficits and impairments:  Hypomobility, Decreased knowledge of precautions, Decreased strength, Increased fascial restricitons, Impaired UE functional use, Pain, Increased muscle spasms, Improper body mechanics, Decreased coordination, Impaired flexibility, Postural dysfunction  Visit Diagnosis: Stiffness of left shoulder, not elsewhere classified  Pain in left shoulder  Muscle weakness (generalized)  Abnormal posture     Problem List Patient Active Problem List   Diagnosis Date Noted  . Hypokalemia   . Preop cardiovascular exam 02/24/2014  . Apical variant hypertrophic cardiomyopathy ([place]) 02/24/2014  . Tobacco abuse 02/24/2014  . HYPERTENSION, BENIGN 12/12/2008  . CHEST PAIN-UNSPECIFIED 12/12/2008  . ABNORMAL EKG 12/12/2008    Deniece Ree PT, DPT [place] [address] [place], [place], [address] Phone: [phone]   Fax:  [phone]  Name: [name] Foster MRN: [id number] Date of Birth: 09-14-1956

## 2016-04-15 ENCOUNTER — Ambulatory Visit (HOSPITAL_COMMUNITY): Payer: Worker's Compensation | Admitting: Physical Therapy

## 2016-04-15 DIAGNOSIS — R293 Abnormal posture: Secondary | ICD-10-CM

## 2016-04-15 DIAGNOSIS — M25612 Stiffness of left shoulder, not elsewhere classified: Secondary | ICD-10-CM

## 2016-04-15 DIAGNOSIS — M25512 Pain in left shoulder: Secondary | ICD-10-CM

## 2016-04-15 DIAGNOSIS — M6281 Muscle weakness (generalized): Secondary | ICD-10-CM

## 2016-04-15 NOTE — Therapy (Signed)
[place], [place], [address] Phone: [phone]   Fax:  [phone]  Physical Therapy Treatment  Patient Details  Name: [name] Foster MRN: [id number] Date of Birth: [birth year] Referring Provider: Milly Jakob   Encounter Date: 04/15/2016      PT End of Session - 04/15/16 1605    Visit Number 18   Number of Visits 24   Date for PT Re-Evaluation 04/26/16   Authorization Type Worker's Comp (36 have been approved)  04/07/2016- 12 more visits have been authorized; week of 6/12- 12 more visits authorized    Authorization Time Period 02/15/39 to 06/14/47; recert done on 1/85   Authorization - Visit Number 59   Authorization - Number of Visits 36   PT Start Time 6314   PT Stop Time 1558   PT Time Calculation (min) 43 min   Activity Tolerance Patient tolerated treatment well   Behavior During Therapy [facility] for tasks assessed/performed      Past Medical History  Diagnosis Date  . Benign essential hypertension   . Abnormal EKG   . Tobacco abuse   . Apical variant hypertrophic cardiomyopathy ([place])   . Hypokalemia   . BPH (benign prostatic hyperplasia)     Past Surgical History  Procedure Laterality Date  . Cardiac catheterization      2008  . Kidney surgery Right April 2015    benign tumor removal  . Colonoscopy N/A 02/26/2015    Procedure: COLONOSCOPY;  Surgeon: Rogene Houston, MD;  [facility];  Service: Endoscopy;  Laterality: N/A;  830  . Prostate ablation  12-2015 at [facility]  . Shoulder arthroscopy with rotator cuff repair and subacromial decompression Left 01/18/2016    Procedure: LEFT SHOULDER ARTHROSCOPY WITH ROTATOR CUFF REPAIR AND SUBACROMIAL DECOMPRESSION;  Surgeon: Milly Jakob, MD;  Location: [place];  Service: Orthopedics;  Laterality: Left;  PRE-OP BLOCK WITH GENERAL ANESTHESIA  . Shoulder acromioplasty Left 01/18/2016    Procedure: SHOULDER ACROMIOPLASTY;  Surgeon: Milly Jakob, MD;  Location: [place];  Service: Orthopedics;  Laterality: Left;    There were no vitals filed for this visit.      Subjective Assessment - 04/15/16 1517    Subjective Pt reports he has been having to return to activity at work and things aren't going well. He has an appointment to get his Rt shoulder checked out. States he has pain at night and has to take 2 pain pills to get any rest. Currently in 5/10 pain   Pertinent History HTN, tobacco abuse, hx of cardiac cath    Patient Stated Goals get back to job activites (very active)   Currently in Pain? Yes   Pain Score 5    Pain Location Shoulder   Pain Orientation Left   Pain Descriptors / Indicators Aching   Pain Type Surgical pain                         [facility] Adult PT Treatment/Exercise - 04/15/16 0001    Shoulder Exercises: Supine   External Rotation AAROM;Left;10 reps  10 sec hold    External Rotation Limitations MWM x10 reps    Flexion AAROM;10 reps  10 sec hold    ABduction AAROM;Left;10 reps  10 sec hold    Shoulder Exercises: Standing   Flexion AROM;Left;5 reps  slight isometric contraction x10 sec hold    ABduction Left;AROM;5 reps  slight isometric  contraction x10 sec hold    Extension AROM;Left;5 reps  slight isometric contraction x10 sec each   Other Standing Exercises UE press on ball with external perturbations x20 reps   Manual Therapy   Manual Therapy Joint mobilization;Myofascial release;Passive ROM   Manual therapy comments performed separately from all other skilled intervetnions    Joint Mobilization posterior and inferior GH jt mobs    Myofascial Release surgical incision/scar tissue   Passive ROM flexion, abduction and ER 10x5 sec each.                 PT Education - 04/15/16 1604    Education provided Yes   Education Details discussed importance of full HEP adherence with updated reps and sets this visit   Person(s) Educated Patient   Methods  Explanation;Handout   Comprehension Verbalized understanding;Need further instruction          PT Short Term Goals - 03/29/16 1543    PT SHORT TERM GOAL #1   Title Patient to demonstrate at least 100 degrees of L shoulder flexion and abduction in order to assist in reducing pain and improving general function    Baseline 5/30- flexion about 95-110; abduction around 98 degrees  passively    Time 3   Period Weeks   Status Partially Met   PT SHORT TERM GOAL #2   Title Patient to demonstrate 90 degrees L shoulder IR and at least 45 degrees L shoulder ER in order to assist in reducing pain adn improve function    Baseline 5/30- 80 degrees IR, only 16 degrees ER    Time 3   Period Weeks   Status On-going   PT SHORT TERM GOAL #3   Title Patient to demonstrate symmetrical neuromuscular recrutiment of all scapular and shoulder girlde muscular to demosntrate improved function and neuromuscular recruitment for functional task performance    Time 3   Period Weeks   Status Achieved   PT SHORT TERM GOAL #4   Title Patient to experience pain no more than 4/10 L shoulder during all tasks in order to improve task performance and overall QOL    Baseline 5/30- at night it can get up to a 7-8/10   Time 3   Period Weeks   Status On-going   PT SHORT TERM GOAL #5   Title Patient to be independent in correctly and consistently performing appropriate HEP, to be updated PRN    Baseline 5/30- patient reports good compliance    Time 3   Period Weeks   Status Achieved           PT Long Term Goals - 03/29/16 1546    PT LONG TERM GOAL #1   Title Patient to demonstrate at least 160 degrees L shoulder flexion and ABD in order to improve function and assist in improving overall QOL    Time 6   Period Weeks   Status On-going   PT LONG TERM GOAL #2   Title Patient to demonstrate L shoulder IR of 90 degrees and L shoulder ER of at least 80 degrees in order to improve function and assist in improving  overall QOL    Time 6   Period Weeks   Status On-going   PT LONG TERM GOAL #3   Title Patient to demonstrate strength at least 4/5 in all tested muscle groups in order to improve function and reduce pain, improve regional stability    Baseline 5/30- flexion/abduction approximately 2/5, ER approximately 2/5, IR  approxiamtey 3+/5    Time 6   Period Weeks   Status On-going   PT LONG TERM GOAL #4   Title Patient to report he has been able to use both UEs to dress and wash his hair in order to demosntrate improved general function and ability to perform self-care tasks    Baseline 5/30- reports that this is still pretty tough    Time 6   Period Weeks   Status On-going   PT LONG TERM GOAL #5   Title Patient to report he has been able to return to light duty at work in order to assist in returning to function and improved QOL    Baseline 5/30- has not returned yet    Time 6   Period Weeks   Status On-going               Plan - 04/15/16 1607    Clinical Impression Statement Today's session focused on manual treatment and therex to improve shoulder ROM and muscle activation. Pt continues to demonstrate resting scapular medial border protrusion as well as limited glenohumeral PROM evident by excessive scapular protrusion noted early in the ROM. HEP was updated and pt was encouraged to increased reps and sets of several of his exercises. Will continue to encourage pt in this area.    Rehab Potential Good   PT Frequency 3x / week   PT Duration 4 weeks   PT Treatment/Interventions ADLs/Self Care Home Management;Cryotherapy;Moist Heat;Functional mobility training;Therapeutic activities;Therapeutic exercise;Neuromuscular re-education;Patient/family education;Manual techniques;Passive range of motion;Taping;Electrical Stimulation;Ultrasound   PT Next Visit Plan PROM (block scapula to prevent excessive winging).Sinclair Ship.Marland Kitchenisometrics.Marland Kitchenscapular stabilization   PT Home Exercise Plan updated with  supine AAROM using dowel/cane; gentle shoulder isometrics (abd/flex/ext), scapular retraction,    Consulted and Agree with Plan of Care Patient      Patient will benefit from skilled therapeutic intervention in order to improve the following deficits and impairments:  Hypomobility, Decreased knowledge of precautions, Decreased strength, Increased fascial restricitons, Impaired UE functional use, Pain, Increased muscle spasms, Improper body mechanics, Decreased coordination, Impaired flexibility, Postural dysfunction  Visit Diagnosis: Stiffness of left shoulder, not elsewhere classified  Pain in left shoulder  Muscle weakness (generalized)  Abnormal posture     Problem List Patient Active Problem List   Diagnosis Date Noted  . Hypokalemia   . Preop cardiovascular exam 02/24/2014  . Apical variant hypertrophic cardiomyopathy ([place]) 02/24/2014  . Tobacco abuse 02/24/2014  . HYPERTENSION, BENIGN 12/12/2008  . CHEST PAIN-UNSPECIFIED 12/12/2008  . ABNORMAL EKG 12/12/2008   4:15 PM,04/15/2016 Elly Modena PT, DPT [facility] [place] [address] [place], [place], [address] Phone: [phone]   Fax:  [phone]  Name: Jahron Hunsinger MRN: [id number] Date of Birth: 29-Oct-1956

## 2016-04-18 ENCOUNTER — Ambulatory Visit (HOSPITAL_COMMUNITY): Payer: Worker's Compensation | Admitting: Physical Therapy

## 2016-04-18 DIAGNOSIS — M25612 Stiffness of left shoulder, not elsewhere classified: Secondary | ICD-10-CM | POA: Diagnosis not present

## 2016-04-18 DIAGNOSIS — R293 Abnormal posture: Secondary | ICD-10-CM

## 2016-04-18 DIAGNOSIS — M6281 Muscle weakness (generalized): Secondary | ICD-10-CM

## 2016-04-18 DIAGNOSIS — M25512 Pain in left shoulder: Secondary | ICD-10-CM

## 2016-04-18 NOTE — Therapy (Signed)
[place], [place], [address] Phone: [phone]   Fax:  [phone]  Physical Therapy Treatment  Patient Details  Name: [name] Foster MRN: [id number] Date of Birth: 1956/02/13 Referring Provider: Milly Jakob   Encounter Date: 04/18/2016      PT End of Session - 04/18/16 1606    Visit Number 19   Number of Visits 24   Date for PT Re-Evaluation 04/26/16   Authorization Type Worker's Comp (36 have been approved)  04/07/2016- 12 more visits have been authorized; week of 6/12- 12 more visits authorized    Authorization Time Period 9/82/64 to 1/58/30; recert done on 9/40   Authorization - Visit Number 24   Authorization - Number of Visits 36   PT Start Time 7680   PT Stop Time 1558   PT Time Calculation (min) 43 min   Activity Tolerance Patient tolerated treatment well;Patient limited by pain   Behavior During Therapy [facility] for tasks assessed/performed      Past Medical History  Diagnosis Date  . Benign essential hypertension   . Abnormal EKG   . Tobacco abuse   . Apical variant hypertrophic cardiomyopathy ([place])   . Hypokalemia   . BPH (benign prostatic hyperplasia)     Past Surgical History  Procedure Laterality Date  . Cardiac catheterization      2008  . Kidney surgery Right April 2015    benign tumor removal  . Colonoscopy N/A 02/26/2015    Procedure: COLONOSCOPY;  Surgeon: Rogene Houston, MD;  [facility];  Service: Endoscopy;  Laterality: N/A;  830  . Prostate ablation  12-2015 at [facility]  . Shoulder arthroscopy with rotator cuff repair and subacromial decompression Left 01/18/2016    Procedure: LEFT SHOULDER ARTHROSCOPY WITH ROTATOR CUFF REPAIR AND SUBACROMIAL DECOMPRESSION;  Surgeon: Milly Jakob, MD;  Location: [place];  Service: Orthopedics;  Laterality: Left;  PRE-OP BLOCK WITH GENERAL ANESTHESIA  . Shoulder acromioplasty Left 01/18/2016    Procedure: SHOULDER  ACROMIOPLASTY;  Surgeon: Milly Jakob, MD;  Location: [place];  Service: Orthopedics;  Laterality: Left;    There were no vitals filed for this visit.      Subjective Assessment - 04/18/16 1517    Subjective Patient reports he has started his pain medications again; he complains of burning pain in his arm when he is doing AAROM at home. Reports that he feels like his boss is trying to sneak in things that he is not ready for, reports that if he feels like something is appropriate he lets his boss know.    Pertinent History HTN, tobacco abuse, hx of cardiac cath    Patient Stated Goals get back to job activites (very active)   Currently in Pain? Yes   Pain Score 5    Pain Location Shoulder   Pain Orientation Left   Pain Descriptors / Indicators Aching   Pain Type Surgical pain   Pain Radiating Towards down LE UE above elbow    Pain Onset More than a month ago   Pain Frequency Constant   Aggravating Factors  moving and activity    Pain Relieving Factors pain pils    Effect of Pain on Daily Activities cannot do ADLs or full duties at work             [facility] PT Assessment - 04/18/16 0001    AROM   Left Shoulder Flexion 130 Degrees   Left Shoulder  ABduction 115 Degrees   Left Shoulder Internal Rotation --  dnt    Left Shoulder External Rotation 38 Degrees                     [facility] Adult PT Treatment/Exercise - 04/18/16 0001    Neck Exercises: Seated   Other Seated Exercise cervical ROM/stretches x2 for 30 seconds   upper trap and levator   Other Seated Exercise wallpushups with focus on form and scapular retraction    Shoulder Exercises: Supine   External Rotation PROM;AAROM   External Rotation Limitations x10 PROM with MET, x10 AAROM    Flexion PROM;AAROM   Flexion Limitations x10 PROM with MET; AAROM x10    ABduction PROM;AAROM   ABduction Limitations x10 PROM with MET; x10 AAROM    Shoulder Exercises: Standing   Other Standing Exercises  shoulder pendulums x2 minutes CCW and CW (each)   Manual Therapy   Manual Therapy Joint mobilization;Muscle Energy Technique   Manual therapy comments performed separately from all other skilled intervetnions    Joint Mobilization inferior glide [place] joint    Muscle Energy Technique MET flexion, ABD, ER mixed in with PROM for improved ROM                 PT Education - 04/18/16 1606    Education provided No          PT Short Term Goals - 03/29/16 1543    PT SHORT TERM GOAL #1   Title Patient to demonstrate at least 100 degrees of L shoulder flexion and abduction in order to assist in reducing pain and improving general function    Baseline 5/30- flexion about 95-110; abduction around 98 degrees  passively    Time 3   Period Weeks   Status Partially Met   PT SHORT TERM GOAL #2   Title Patient to demonstrate 90 degrees L shoulder IR and at least 45 degrees L shoulder ER in order to assist in reducing pain adn improve function    Baseline 5/30- 80 degrees IR, only 16 degrees ER    Time 3   Period Weeks   Status On-going   PT SHORT TERM GOAL #3   Title Patient to demonstrate symmetrical neuromuscular recrutiment of all scapular and shoulder girlde muscular to demosntrate improved function and neuromuscular recruitment for functional task performance    Time 3   Period Weeks   Status Achieved   PT SHORT TERM GOAL #4   Title Patient to experience pain no more than 4/10 L shoulder during all tasks in order to improve task performance and overall QOL    Baseline 5/30- at night it can get up to a 7-8/10   Time 3   Period Weeks   Status On-going   PT SHORT TERM GOAL #5   Title Patient to be independent in correctly and consistently performing appropriate HEP, to be updated PRN    Baseline 5/30- patient reports good compliance    Time 3   Period Weeks   Status Achieved           PT Long Term Goals - 03/29/16 1546    PT LONG TERM GOAL #1   Title Patient to demonstrate  at least 160 degrees L shoulder flexion and ABD in order to improve function and assist in improving overall QOL    Time 6   Period Weeks   Status On-going   PT LONG TERM GOAL #2   Title  Patient to demonstrate L shoulder IR of 90 degrees and L shoulder ER of at least 80 degrees in order to improve function and assist in improving overall QOL    Time 6   Period Weeks   Status On-going   PT LONG TERM GOAL #3   Title Patient to demonstrate strength at least 4/5 in all tested muscle groups in order to improve function and reduce pain, improve regional stability    Baseline 5/30- flexion/abduction approximately 2/5, ER approximately 2/5, IR approxiamtey 3+/5    Time 6   Period Weeks   Status On-going   PT LONG TERM GOAL #4   Title Patient to report he has been able to use both UEs to dress and wash his hair in order to demosntrate improved general function and ability to perform self-care tasks    Baseline 5/30- reports that this is still pretty tough    Time 6   Period Weeks   Status On-going   PT LONG TERM GOAL #5   Title Patient to report he has been able to return to light duty at work in order to assist in returning to function and improved QOL    Baseline 5/30- has not returned yet    Time 6   Period Weeks   Status On-going               Plan - 04/18/16 1608    Clinical Impression Statement Focused on PROM/AAROM during today's session, with MET introduced to PROM today in order to assist in improving ROM. Patient continues to be limited by pain during ROM exercises however did respond well to MET mixed into PROM activities today. Able to take measurements of shoulder today, showing good improvement on all planes however patient does continue to remain stiff and ROM limited at this time. Continued with shoulder pendulums, introduced more extensive cervical stretches and introduced wall pushups with primary goal of reducing scapular winging at this time.    Rehab Potential Good    PT Frequency 3x / week   PT Duration 4 weeks   PT Treatment/Interventions ADLs/Self Care Home Management;Cryotherapy;Moist Heat;Functional mobility training;Therapeutic activities;Therapeutic exercise;Neuromuscular re-education;Patient/family education;Manual techniques;Passive range of motion;Taping;Electrical Stimulation;Ultrasound   PT Next Visit Plan PROM (block scapula to prevent excessive winging).Sinclair Ship.Marland Kitchenisometrics.Marland Kitchenscapular stabilization. Continue MET with ROM.    Consulted and Agree with Plan of Care Patient      Patient will benefit from skilled therapeutic intervention in order to improve the following deficits and impairments:  Hypomobility, Decreased knowledge of precautions, Decreased strength, Increased fascial restricitons, Impaired UE functional use, Pain, Increased muscle spasms, Improper body mechanics, Decreased coordination, Impaired flexibility, Postural dysfunction  Visit Diagnosis: Stiffness of left shoulder, not elsewhere classified  Pain in left shoulder  Muscle weakness (generalized)  Abnormal posture     Problem List Patient Active Problem List   Diagnosis Date Noted  . Hypokalemia   . Preop cardiovascular exam 02/24/2014  . Apical variant hypertrophic cardiomyopathy ([place]) 02/24/2014  . Tobacco abuse 02/24/2014  . HYPERTENSION, BENIGN 12/12/2008  . CHEST PAIN-UNSPECIFIED 12/12/2008  . ABNORMAL EKG 12/12/2008    Deniece Ree PT, DPT [place] [address] [place], [place], [address] Phone: [phone]   Fax:  [phone]  Name: [name] Foster MRN: [id number] Date of Birth: [birth year]

## 2016-04-20 ENCOUNTER — Ambulatory Visit (HOSPITAL_COMMUNITY): Payer: Worker's Compensation

## 2016-04-20 DIAGNOSIS — M25612 Stiffness of left shoulder, not elsewhere classified: Secondary | ICD-10-CM | POA: Diagnosis not present

## 2016-04-20 DIAGNOSIS — M6281 Muscle weakness (generalized): Secondary | ICD-10-CM

## 2016-04-20 DIAGNOSIS — R293 Abnormal posture: Secondary | ICD-10-CM

## 2016-04-20 DIAGNOSIS — M25512 Pain in left shoulder: Secondary | ICD-10-CM

## 2016-04-20 NOTE — Therapy (Signed)
[facility] [address] [place], [place], [address] Phone: [phone]   Fax:  [phone]  Physical Therapy Treatment  Patient Details  Name: [name] Foster MRN: [id number] Date of Birth: 1955/12/03 Referring Provider: Milly Jakob   Encounter Date: 04/20/2016      PT End of Session - 04/20/16 1600    Visit Number 20   Number of Visits 24   Date for PT Re-Evaluation 04/26/16   Authorization Type Worker's Comp (36 have been approved)  04/07/2016- 12 more visits have been authorized; week of 6/12- 12 more visits authorized    Authorization Time Period 01/27/06 to 04/21/62; recert done on 3/35   Authorization - Visit Number 27   Authorization - Number of Visits 36   PT Start Time 1522   PT Stop Time 1600   PT Time Calculation (min) 38 min      Past Medical History  Diagnosis Date  . Benign essential hypertension   . Abnormal EKG   . Tobacco abuse   . Apical variant hypertrophic cardiomyopathy ([place])   . Hypokalemia   . BPH (benign prostatic hyperplasia)     Past Surgical History  Procedure Laterality Date  . Cardiac catheterization      2008  . Kidney surgery Right April 2015    benign tumor removal  . Colonoscopy N/A 02/26/2015    Procedure: COLONOSCOPY;  Surgeon: Rogene Houston, MD;  [facility];  Service: Endoscopy;  Laterality: N/A;  830  . Prostate ablation  12-2015 at [facility]  . Shoulder arthroscopy with rotator cuff repair and subacromial decompression Left 01/18/2016    Procedure: LEFT SHOULDER ARTHROSCOPY WITH ROTATOR CUFF REPAIR AND SUBACROMIAL DECOMPRESSION;  Surgeon: Milly Jakob, MD;  Location: [facility];  Service: Orthopedics;  Laterality: Left;  PRE-OP BLOCK WITH GENERAL ANESTHESIA  . Shoulder acromioplasty Left 01/18/2016    Procedure: SHOULDER ACROMIOPLASTY;  Surgeon: Milly Jakob, MD;  Location: [facility];  Service: Orthopedics;  Laterality: Left;    There were no  vitals filed for this visit.      Subjective Assessment - 04/20/16 1524    Subjective Pt. stated he has started having increased pain Rt shoulder as well, has MRI schedulded for06/29/2017 to check it out.  Current pain scale 5/10 Lt shoulder, 4/10 Rt.  Reports complaince with HEP daily.     Currently in Pain? Yes   Pain Score 5    Pain Location Shoulder   Pain Orientation Left;Right  Lt 5/10, Rt 4/10   Pain Descriptors / Indicators Dull;Tender;Burning  [place] on chest   Pain Type Surgical pain   Pain Radiating Towards down LE UE above elbow only at night now   Pain Onset More than a month ago   Pain Frequency Constant   Aggravating Factors  moving and activity   Pain Relieving Factors pain pils   Effect of Pain on Daily Activities cannot do ADLs or full duties at work              [facility] Adult PT Treatment/Exercise - 04/20/16 0001    Shoulder Exercises: Supine   External Rotation PROM;AAROM   External Rotation Limitations x10 PROM with MET, x10 AAROM   Flexion PROM;AAROM   Flexion Limitations x10 PROM with MET; AAROM x10    ABduction PROM;AAROM   ABduction Limitations x10 PROM with MET; x10 AAROM    Manual Therapy   Manual Therapy Joint mobilization;Muscle Energy Technique;Soft tissue mobilization  Manual therapy comments performed separately from all other skilled intervetnions    Joint Mobilization inferior glide GH joint    Soft tissue mobilization Lt upper traps, levator scapula, scalenes, anterior and medial deltoids and biceps   Muscle Energy Technique MET flexion, ABD, ER mixed in with PROM for improved ROM            PT Short Term Goals - 03/29/16 1543    PT SHORT TERM GOAL #1   Title Patient to demonstrate at least 100 degrees of L shoulder flexion and abduction in order to assist in reducing pain and improving general function    Baseline 5/30- flexion about 95-110; abduction around 98 degrees  passively    Time 3   Period Weeks   Status Partially Met    PT SHORT TERM GOAL #2   Title Patient to demonstrate 90 degrees L shoulder IR and at least 45 degrees L shoulder ER in order to assist in reducing pain adn improve function    Baseline 5/30- 80 degrees IR, only 16 degrees ER    Time 3   Period Weeks   Status On-going   PT SHORT TERM GOAL #3   Title Patient to demonstrate symmetrical neuromuscular recrutiment of all scapular and shoulder girlde muscular to demosntrate improved function and neuromuscular recruitment for functional task performance    Time 3   Period Weeks   Status Achieved   PT SHORT TERM GOAL #4   Title Patient to experience pain no more than 4/10 L shoulder during all tasks in order to improve task performance and overall QOL    Baseline 5/30- at night it can get up to a 7-8/10   Time 3   Period Weeks   Status On-going   PT SHORT TERM GOAL #5   Title Patient to be independent in correctly and consistently performing appropriate HEP, to be updated PRN    Baseline 5/30- patient reports good compliance    Time 3   Period Weeks   Status Achieved           PT Long Term Goals - 03/29/16 1546    PT LONG TERM GOAL #1   Title Patient to demonstrate at least 160 degrees L shoulder flexion and ABD in order to improve function and assist in improving overall QOL    Time 6   Period Weeks   Status On-going   PT LONG TERM GOAL #2   Title Patient to demonstrate L shoulder IR of 90 degrees and L shoulder ER of at least 80 degrees in order to improve function and assist in improving overall QOL    Time 6   Period Weeks   Status On-going   PT LONG TERM GOAL #3   Title Patient to demonstrate strength at least 4/5 in all tested muscle groups in order to improve function and reduce pain, improve regional stability    Baseline 5/30- flexion/abduction approximately 2/5, ER approximately 2/5, IR approxiamtey 3+/5    Time 6   Period Weeks   Status On-going   PT LONG TERM GOAL #4   Title Patient to report he has been able to use  both UEs to dress and wash his hair in order to demosntrate improved general function and ability to perform self-care tasks    Baseline 5/30- reports that this is still pretty tough    Time 6   Period Weeks   Status On-going   PT LONG TERM GOAL #5   Title  Patient to report he has been able to return to light duty at work in order to assist in returning to function and improved QOL    Baseline 5/30- has not returned yet    Time 6   Period Weeks   Status On-going               Plan - 04/20/16 1703    Clinical Impression Statement Continued session focus on improving ROM with manual techniques to address fascial restrictions and spams rotator cuff musculature as well as PROM per pt. tolerance.  Pt continues to exhbit significant range restrictions and limited by pain at end range.  Noted positive results following distraciton and MET with PROM for increased tolerance with activity.  Therapist faciilitation for proper form with all exercises and scapular mobs to prevent excessive winging.     Rehab Potential Good   PT Frequency 3x / week   PT Duration 4 weeks   PT Treatment/Interventions ADLs/Self Care Home Management;Cryotherapy;Moist Heat;Functional mobility training;Therapeutic activities;Therapeutic exercise;Neuromuscular re-education;Patient/family education;Manual techniques;Passive range of motion;Taping;Electrical Stimulation;Ultrasound   PT Next Visit Plan PROM (block scapula to prevent excessive winging).Sinclair Ship.Marland Kitchenisometrics.Marland Kitchenscapular stabilization. Continue MET with ROM.       Patient will benefit from skilled therapeutic intervention in order to improve the following deficits and impairments:  Hypomobility, Decreased knowledge of precautions, Decreased strength, Increased fascial restricitons, Impaired UE functional use, Pain, Increased muscle spasms, Improper body mechanics, Decreased coordination, Impaired flexibility, Postural dysfunction  Visit Diagnosis: Stiffness of  left shoulder, not elsewhere classified  Pain in left shoulder  Muscle weakness (generalized)  Abnormal posture     Problem List Patient Active Problem List   Diagnosis Date Noted  . Hypokalemia   . Preop cardiovascular exam 02/24/2014  . Apical variant hypertrophic cardiomyopathy ([place]) 02/24/2014  . Tobacco abuse 02/24/2014  . HYPERTENSION, BENIGN 12/12/2008  . CHEST PAIN-UNSPECIFIED 12/12/2008  . ABNORMAL EKG 12/12/2008   Ihor Austin, LPTA; [place]  Aldona Lento 04/20/2016, 5:46 PM  [facility] [place], [place], [address] Phone: [phone]   Fax:  [phone]  Name: [name] Foster MRN: [id number] Date of Birth: 05-Oct-1956

## 2016-04-22 ENCOUNTER — Ambulatory Visit (HOSPITAL_COMMUNITY): Payer: Worker's Compensation | Admitting: Physical Therapy

## 2016-04-22 DIAGNOSIS — R293 Abnormal posture: Secondary | ICD-10-CM

## 2016-04-22 DIAGNOSIS — M25612 Stiffness of left shoulder, not elsewhere classified: Secondary | ICD-10-CM

## 2016-04-22 DIAGNOSIS — M25512 Pain in left shoulder: Secondary | ICD-10-CM

## 2016-04-22 DIAGNOSIS — M6281 Muscle weakness (generalized): Secondary | ICD-10-CM

## 2016-04-22 NOTE — Therapy (Signed)
[facility] [place], [place], [address] Phone: [phone]   Fax:  [phone]  Physical Therapy Treatment  Patient Details  Name: [name] Foster MRN: [id number] Date of Birth: January 10, 1956 Referring Provider: Milly Jakob   Encounter Date: 04/22/2016      PT End of Session - 04/22/16 1612    Visit Number 21   Number of Visits 24   Date for PT Re-Evaluation 04/26/16   Authorization Type Worker's Comp (36 have been approved)  04/07/2016- 12 more visits have been authorized; week of 6/12- 12 more visits authorized    Authorization Time Period 2/48/25 to 0/03/70; recert done on 4/88   Authorization - Visit Number 55   Authorization - Number of Visits 36   PT Start Time 1520  pt using the bathroom    PT Stop Time 1559   PT Time Calculation (min) 39 min   Activity Tolerance Patient tolerated treatment well   Behavior During Therapy [facility] for tasks assessed/performed      Past Medical History  Diagnosis Date  . Benign essential hypertension   . Abnormal EKG   . Tobacco abuse   . Apical variant hypertrophic cardiomyopathy ([place])   . Hypokalemia   . BPH (benign prostatic hyperplasia)     Past Surgical History  Procedure Laterality Date  . Cardiac catheterization      2008  . Kidney surgery Right April 2015    benign tumor removal  . Colonoscopy N/A 02/26/2015    Procedure: COLONOSCOPY;  Surgeon: Rogene Houston, MD;  [facility];  Service: Endoscopy;  Laterality: N/A;  830  . Prostate ablation  12-2015 at [facility]  . Shoulder arthroscopy with rotator cuff repair and subacromial decompression Left 01/18/2016    Procedure: LEFT SHOULDER ARTHROSCOPY WITH ROTATOR CUFF REPAIR AND SUBACROMIAL DECOMPRESSION;  Surgeon: Milly Jakob, MD;  Location: [facility];  Service: Orthopedics;  Laterality: Left;  PRE-OP BLOCK WITH GENERAL ANESTHESIA  . Shoulder acromioplasty Left 01/18/2016    Procedure: SHOULDER  ACROMIOPLASTY;  Surgeon: Milly Jakob, MD;  Location: [facility];  Service: Orthopedics;  Laterality: Left;    There were no vitals filed for this visit.      Subjective Assessment - 04/22/16 1520    Subjective Pt states he is doing his exercises every day. He continues to have pain down his left neck and down to the deltoid insertion. He is noticing more pain in his Rt shoulder also, and is supposed to get an MRI soon.    Pertinent History HTN, tobacco abuse, hx of cardiac cath    Patient Stated Goals get back to job activites (very active)   Currently in Pain? Yes  dull ache along Lt deltoid insertion   Pain Onset More than a month ago                         [facility] Adult PT Treatment/Exercise - 04/22/16 0001    Shoulder Exercises: Seated   Row Strengthening;20 reps  x2 sets with green TB   Flexion PROM   Flexion Limitations table slides 10x10 sec   Abduction PROM   ABduction Limitations table slides 10x10 sec   Shoulder Exercises: Standing   Other Standing Exercises Shoulder isometrics (25% effort) ext/flexion/abd/IR   Manual Therapy   Manual Therapy Joint mobilization;Passive ROM   Manual therapy comments performed separately from all other skilled intervetnions    Joint Mobilization Grade  III-IV inferior GH mobs at 90deg abd, MWM ER at 30 deg abd   Soft tissue mobilization Lt lat   Passive ROM Felxion, abduction 10x10                PT Education - 04/22/16 1611    Education provided Yes   Education Details importance of HEP adherence and gaining ROM to improve functional outcome; reviewed HEP   Person(s) Educated Patient   Methods Explanation;Demonstration   Comprehension Verbalized understanding;Returned demonstration;Verbal cues required;Need further instruction          PT Short Term Goals - 03/29/16 1543    PT SHORT TERM GOAL #1   Title Patient to demonstrate at least 100 degrees of L shoulder flexion and abduction in  order to assist in reducing pain and improving general function    Baseline 5/30- flexion about 95-110; abduction around 98 degrees  passively    Time 3   Period Weeks   Status Partially Met   PT SHORT TERM GOAL #2   Title Patient to demonstrate 90 degrees L shoulder IR and at least 45 degrees L shoulder ER in order to assist in reducing pain adn improve function    Baseline 5/30- 80 degrees IR, only 16 degrees ER    Time 3   Period Weeks   Status On-going   PT SHORT TERM GOAL #3   Title Patient to demonstrate symmetrical neuromuscular recrutiment of all scapular and shoulder girlde muscular to demosntrate improved function and neuromuscular recruitment for functional task performance    Time 3   Period Weeks   Status Achieved   PT SHORT TERM GOAL #4   Title Patient to experience pain no more than 4/10 L shoulder during all tasks in order to improve task performance and overall QOL    Baseline 5/30- at night it can get up to a 7-8/10   Time 3   Period Weeks   Status On-going   PT SHORT TERM GOAL #5   Title Patient to be independent in correctly and consistently performing appropriate HEP, to be updated PRN    Baseline 5/30- patient reports good compliance    Time 3   Period Weeks   Status Achieved           PT Long Term Goals - 03/29/16 1546    PT LONG TERM GOAL #1   Title Patient to demonstrate at least 160 degrees L shoulder flexion and ABD in order to improve function and assist in improving overall QOL    Time 6   Period Weeks   Status On-going   PT LONG TERM GOAL #2   Title Patient to demonstrate L shoulder IR of 90 degrees and L shoulder ER of at least 80 degrees in order to improve function and assist in improving overall QOL    Time 6   Period Weeks   Status On-going   PT LONG TERM GOAL #3   Title Patient to demonstrate strength at least 4/5 in all tested muscle groups in order to improve function and reduce pain, improve regional stability    Baseline 5/30-  flexion/abduction approximately 2/5, ER approximately 2/5, IR approxiamtey 3+/5    Time 6   Period Weeks   Status On-going   PT LONG TERM GOAL #4   Title Patient to report he has been able to use both UEs to dress and wash his hair in order to demosntrate improved general function and ability to perform self-care tasks  Baseline 5/30- reports that this is still pretty tough    Time 6   Period Weeks   Status On-going   PT LONG TERM GOAL #5   Title Patient to report he has been able to return to light duty at work in order to assist in returning to function and improved QOL    Baseline 5/30- has not returned yet    Time 6   Period Weeks   Status On-going               Plan - 04/22/16 1613    Clinical Impression Statement Pt continues to present with joint and muscle restrictions throughout his LUE. Noted trigger points along his Lt lat which he reported some improvement in pain after manual techniques to release the muscle. I reviewed several exercises in his HEP and provided verbal cues to correct proper performance/technique. He demonstrated good understanding by the end of the session. Will continue to address limitations in ROM, strength and posture.    Rehab Potential Good   PT Frequency 3x / week   PT Duration 4 weeks   PT Treatment/Interventions ADLs/Self Care Home Management;Cryotherapy;Moist Heat;Functional mobility training;Therapeutic activities;Therapeutic exercise;Neuromuscular re-education;Patient/family education;Manual techniques;Passive range of motion;Taping;Electrical Stimulation;Ultrasound   PT Next Visit Plan PROM (block scapula to prevent excessive winging).Sinclair Ship.Marland Kitchenisometrics.Marland Kitchenscapular stabilization. Continue MET with ROM.    PT Home Exercise Plan no updates this visit   Consulted and Agree with Plan of Care Patient      Patient will benefit from skilled therapeutic intervention in order to improve the following deficits and impairments:  Hypomobility,  Decreased knowledge of precautions, Decreased strength, Increased fascial restricitons, Impaired UE functional use, Pain, Increased muscle spasms, Improper body mechanics, Decreased coordination, Impaired flexibility, Postural dysfunction  Visit Diagnosis: Stiffness of left shoulder, not elsewhere classified  Pain in left shoulder  Muscle weakness (generalized)  Abnormal posture     Problem List Patient Active Problem List   Diagnosis Date Noted  . Hypokalemia   . Preop cardiovascular exam 02/24/2014  . Apical variant hypertrophic cardiomyopathy ([place]) 02/24/2014  . Tobacco abuse 02/24/2014  . HYPERTENSION, BENIGN 12/12/2008  . CHEST PAIN-UNSPECIFIED 12/12/2008  . ABNORMAL EKG 12/12/2008   4:24 PM,04/22/2016 Elly Modena PT, DPT [facility] [address] [place], [place], [address] Phone: [phone]   Fax:  [phone]  Name: Kaiyu Mirabal MRN: [id number] Date of Birth: 1956-05-12

## 2016-04-25 ENCOUNTER — Ambulatory Visit (HOSPITAL_COMMUNITY): Payer: Worker's Compensation | Admitting: Physical Therapy

## 2016-04-25 DIAGNOSIS — M6281 Muscle weakness (generalized): Secondary | ICD-10-CM

## 2016-04-25 DIAGNOSIS — M25612 Stiffness of left shoulder, not elsewhere classified: Secondary | ICD-10-CM

## 2016-04-25 DIAGNOSIS — M25512 Pain in left shoulder: Secondary | ICD-10-CM

## 2016-04-25 DIAGNOSIS — R293 Abnormal posture: Secondary | ICD-10-CM

## 2016-04-25 NOTE — Therapy (Signed)
[facility] [address] [place], [place], [address] Phone: [phone]   Fax:  [phone]  Physical Therapy Treatment (Re-Assessment)  Patient Details  Name: [name] Foster MRN: [id number] Date of Birth: 11/08/1955 Referring Provider: Milly Jakob   Encounter Date: 04/25/2016      PT End of Session - 04/25/16 1711    Visit Number 22   Number of Visits 34   Date for PT Re-Evaluation 05/23/16   Authorization Type Worker's Comp (36 have been approved)  04/07/2016- 12 more visits have been authorized; week of 6/12- 12 more visits authorized    Authorization Time Period 0/45/99 to 7/74/14; recert done on 2/39; recert done on 5/32   Authorization - Visit Number 21   Authorization - Number of Visits 36   PT Start Time 1520   PT Stop Time 1558   PT Time Calculation (min) 38 min   Activity Tolerance Patient tolerated treatment well   Behavior During Therapy [facility] for tasks assessed/performed      Past Medical History  Diagnosis Date  . Benign essential hypertension   . Abnormal EKG   . Tobacco abuse   . Apical variant hypertrophic cardiomyopathy ([place])   . Hypokalemia   . BPH (benign prostatic hyperplasia)     Past Surgical History  Procedure Laterality Date  . Cardiac catheterization      2008  . Kidney surgery Right April 2015    benign tumor removal  . Colonoscopy N/A 02/26/2015    Procedure: COLONOSCOPY;  Surgeon: Rogene Houston, MD;  [facility];  Service: Endoscopy;  Laterality: N/A;  830  . Prostate ablation  12-2015 at [facility]  . Shoulder arthroscopy with rotator cuff repair and subacromial decompression Left 01/18/2016    Procedure: LEFT SHOULDER ARTHROSCOPY WITH ROTATOR CUFF REPAIR AND SUBACROMIAL DECOMPRESSION;  Surgeon: Milly Jakob, MD;  Location: [facility];  Service: Orthopedics;  Laterality: Left;  PRE-OP BLOCK WITH GENERAL ANESTHESIA  . Shoulder acromioplasty Left 01/18/2016    Procedure:  SHOULDER ACROMIOPLASTY;  Surgeon: Milly Jakob, MD;  Location: [facility];  Service: Orthopedics;  Laterality: Left;    There were no vitals filed for this visit.      Subjective Assessment - 04/25/16 1521    Subjective Patient reports that his R shoulder is still hurting; he has ifnihsed his steriods and is now back on all of his medicines. He states that he is going back to MD for his R shoulder on Thursday. He rates himself around a 50/100 on subjective scale, he is still at light duty  at work. Nothing else major going on.    Pertinent History HTN, tobacco abuse, hx of cardiac cath    Patient Stated Goals get back to job activites (very active)   Currently in Pain? Yes   Pain Score 5    Pain Location Shoulder   Pain Orientation Right;Left   Pain Descriptors / Indicators Burning;Tender;Throbbing   Pain Type Surgical pain   Pain Radiating Towards running down to halfway down humerus/around distal deltoid insertion L UE    Pain Onset More than a month ago   Pain Frequency Constant   Aggravating Factors  moving and activity    Pain Relieving Factors pain pills    Effect of Pain on Daily Activities cannot do ADLs or full duties at work             [facility] PT Assessment - 04/25/16 0001  Assessment   Medical Diagnosis L rotator cuff repair    Referring Provider Milly Jakob    Onset Date/Surgical Date 01/18/16   Next MD Visit June 29th with Dr. [place] Silos    Precautions   Precautions Shoulder   Type of Shoulder Precautions Using [facility] Ip 2015 protocol    Precaution Comments reports MD did not really give him any specific precaution   Balance Screen   Has the patient fallen in the past 6 months Yes   How many times? 1- fall taht caused injury    Has the patient had a decrease in activity level because of a fear of falling?  Yes   Is the patient reluctant to leave their home because of a fear of falling?  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, dig, perform manual labor    Leisure no hobbies    Observation/Other Assessments   Observations widespread muscle atrophy noted L shoulder girlde complex    Focus on Therapeutic Outcomes (FOTO)  59% limited    Posture/Postural Control   Posture/Postural Control Postural limitations   Postural Limitations Forward head;Rounded Shoulders   AROM   Left Shoulder Flexion 133 Degrees   Left Shoulder ABduction 112 Degrees   Left Shoulder External Rotation 38 Degrees  40 degrees of ABD    Strength   Left Shoulder Flexion 2/5   Left Shoulder ABduction 2/5   Left Shoulder Internal Rotation 4-/5   Left Shoulder External Rotation 2/5   Palpation   Palpation comment less muscle knotting noted, however still present                      [facility] Adult PT Treatment/Exercise - 04/25/16 0001    Shoulder Exercises: Supine   External Rotation Limitations x15 AAROM   Flexion Limitations x15 AAROM    ABduction Limitations x15 AAROM                 PT Education - 04/25/16 1710    Education provided Yes   Education Details progress with skilled PT services, POC moving forward    Person(s) Educated Patient   Methods Explanation   Comprehension Verbalized understanding          PT Short Term Goals - 04/25/16 1540    PT SHORT TERM GOAL #1   Title Patient to demonstrate at least 100 degrees of L shoulder flexion and abduction in order to assist in reducing pain and improving general function    Baseline 6/26- flexion 133, abduction 112   Time 3   Period Weeks   Status Achieved   PT SHORT TERM GOAL #2   Title Patient to demonstrate 90 degrees L shoulder IR and at least 45 degrees L shoulder ER in order to assist in reducing pain adn improve function    Baseline 6/26- IR WFL, ER 38 at 40  degrees ABD    Time 3   Period Weeks   Status Partially Met   PT SHORT TERM GOAL #3   Title Patient to demonstrate symmetrical neuromuscular recrutiment of all scapular and shoulder girlde muscular to demosntrate improved function and neuromuscular recruitment for functional task performance    Time 3   Period Weeks   Status Achieved   PT SHORT TERM GOAL #4   Title Patient to experience  pain no more than 4/10 L shoulder during all tasks in order to improve task performance and overall QOL    Baseline 6/26- average is about 5/10   Time 3   Period Weeks   Status On-going   PT SHORT TERM GOAL #5   Title Patient to be independent in correctly and consistently performing appropriate HEP, to be updated PRN    Baseline 6/26-reports good compliance    Time 3   Period Weeks   Status Achieved           PT Long Term Goals - 04/25/16 1543    PT LONG TERM GOAL #1   Title Patient to demonstrate at least 160 degrees L shoulder flexion and ABD in order to improve function and assist in improving overall QOL    Time 6   Period Weeks   Status On-going   PT LONG TERM GOAL #2   Title Patient to demonstrate L shoulder IR of 90 degrees and L shoulder ER of at least 80 degrees in order to improve function and assist in improving overall QOL    Time 6   Period Weeks   Status On-going   PT LONG TERM GOAL #3   Title Patient to demonstrate strength at least 4/5 in all tested muscle groups in order to improve function and reduce pain, improve regional stability    Time 6   Period Weeks   Status On-going   PT LONG TERM GOAL #4   Title Patient to report he has been able to use both UEs to dress and wash his hair in order to demosntrate improved general function and ability to perform self-care tasks    Time 6   Period Weeks   Status On-going   PT LONG TERM GOAL #5   Title Patient to report he has been able to return to light duty at work in order to assist in returning to function and improved QOL     Baseline 6/26- has returned to light duty at work but is still on very light precautions per MD    Time 6   Period Weeks   Status Achieved               Plan - 04/25/16 1553    Clinical Impression Statement Re-assessment performed today. Patient appears to be making slow but steady progress towards his goals at this time, with ROM improving and with skilled rehabilitation staff progressing into AAROM per recent MD order. Patient does however continue to demonstrate significant ROM deficits in the PROM/AAROM/AROM aspects, impaired functional strength, impaired posture, and reduced functional use of UE at this time. He does continue to display widespread muscle atrophy in L UE and shoulder girdle however PT has not progressed to AROM or strengthening exercises at this time secondary to ROM deficits. Patient reports full and consistent compliance with his HEP. At this point recommend continuation of skilled PT services in order to continue addressing functional limitations and to attempt return to functional tasks.    Rehab Potential Good   PT Frequency 3x / week   PT Duration 4 weeks   PT Treatment/Interventions ADLs/Self Care Home Management;Cryotherapy;Moist Heat;Functional mobility training;Therapeutic activities;Therapeutic exercise;Neuromuscular re-education;Patient/family education;Manual techniques;Passive range of motion;Taping;Electrical Stimulation;Ultrasound   PT Next Visit Plan PROM (block scapula to prevent excessive winging).Sinclair Ship.Marland Kitchenisometrics.Marland Kitchenscapular stabilization. Continue MET with ROM.    PT Home Exercise Plan no updates this visit   Consulted and Agree with Plan of Care Patient  Patient will benefit from skilled therapeutic intervention in order to improve the following deficits and impairments:  Hypomobility, Decreased knowledge of precautions, Decreased strength, Increased fascial restricitons, Impaired UE functional use, Pain, Increased muscle spasms, Improper  body mechanics, Decreased coordination, Impaired flexibility, Postural dysfunction  Visit Diagnosis: Stiffness of left shoulder, not elsewhere classified - Plan: PT plan of care cert/re-cert  Pain in left shoulder - Plan: PT plan of care cert/re-cert  Muscle weakness (generalized) - Plan: PT plan of care cert/re-cert  Abnormal posture - Plan: PT plan of care cert/re-cert     Problem List Patient Active Problem List   Diagnosis Date Noted  . Hypokalemia   . Preop cardiovascular exam 02/24/2014  . Apical variant hypertrophic cardiomyopathy ([place]) 02/24/2014  . Tobacco abuse 02/24/2014  . HYPERTENSION, BENIGN 12/12/2008  . CHEST PAIN-UNSPECIFIED 12/12/2008  . ABNORMAL EKG 12/12/2008    Deniece Ree PT, DPT [facility] [address] [place], [place], [address] Phone: [phone]   Fax:  [phone]  Name: [name] Foster MRN: [id number] Date of Birth: 07/01/1956

## 2016-04-27 ENCOUNTER — Ambulatory Visit (HOSPITAL_COMMUNITY): Payer: Worker's Compensation | Admitting: Physical Therapy

## 2016-04-27 DIAGNOSIS — M6281 Muscle weakness (generalized): Secondary | ICD-10-CM

## 2016-04-27 DIAGNOSIS — M25512 Pain in left shoulder: Secondary | ICD-10-CM

## 2016-04-27 DIAGNOSIS — M25612 Stiffness of left shoulder, not elsewhere classified: Secondary | ICD-10-CM

## 2016-04-27 DIAGNOSIS — R293 Abnormal posture: Secondary | ICD-10-CM

## 2016-04-27 NOTE — Therapy (Signed)
[facility] [address] [place], [place], [address] Phone: [phone]   Fax:  [phone]  Physical Therapy Treatment  Patient Details  Name: [name] Foster MRN: [id number] Date of Birth: 12/30/1955 Referring Provider: David Thompson   Encounter Date: 04/27/2016      PT End of Session - 04/27/16 1602    Visit Number 23   Number of Visits 34   Date for PT Re-Evaluation 05/23/16   Authorization Type Worker's Comp (36 have been approved)  04/07/2016- 12 more visits have been authorized; week of 6/12- 12 more visits authorized    Authorization Time Period 02/17/16 to 03/23/16; recert done on 5/30; recert done on 6/26   Authorization - Visit Number 23   Authorization - Number of Visits 36   PT Start Time 1519   PT Stop Time 1558   PT Time Calculation (min) 39 min   Activity Tolerance Patient tolerated treatment well   Behavior During Therapy WFL for tasks assessed/performed      Past Medical History  Diagnosis Date  . Benign essential hypertension   . Abnormal EKG   . Tobacco abuse   . Apical variant hypertrophic cardiomyopathy ([facility])   . Hypokalemia   . BPH (benign prostatic hyperplasia)     Past Surgical History  Procedure Laterality Date  . Cardiac catheterization      2008  . Kidney surgery Right April 2015    benign tumor removal  . Colonoscopy N/A 02/26/2015    Procedure: COLONOSCOPY;  Surgeon: Najeeb U Rehman, MD;  [facility];  Service: Endoscopy;  Laterality: N/A;  830  . Prostate ablation  12-2015 at [facility]  . Shoulder arthroscopy with rotator cuff repair and subacromial decompression Left 01/18/2016    Procedure: LEFT SHOULDER ARTHROSCOPY WITH ROTATOR CUFF REPAIR AND SUBACROMIAL DECOMPRESSION;  Surgeon: David Thompson, MD;  Location: [facility];  Service: Orthopedics;  Laterality: Left;  PRE-OP BLOCK WITH GENERAL ANESTHESIA  . Shoulder acromioplasty Left 01/18/2016    Procedure: SHOULDER  ACROMIOPLASTY;  Surgeon: David Thompson, MD;  Location: [facility];  Service: Orthopedics;  Laterality: Left;    There were no vitals filed for this visit.      Subjective Assessment - 04/27/16 1519    Subjective Patient reports no major changes since last session, he is still having MRI tomorrow    Patient Stated Goals get back to job activites (very active)   Currently in Pain? Yes   Pain Score 5  In L shoulder                          [facility] Adult PT Treatment/Exercise - 04/27/16 0001    Neck Exercises: Seated   Neck Retraction --   Other Seated Exercise seated flexion and abduction stretches 2x30 seconds    Other Seated Exercise wallpushups with focus on form and scapular retraction    Shoulder Exercises: Supine   External Rotation PROM;AAROM   External Rotation Limitations x15 AAROM   Flexion PROM;AAROM   Flexion Limitations x15 AAROM    ABduction PROM;AAROM   ABduction Limitations x15 AAROM    Manual Therapy   Manual Therapy Joint mobilization;Passive ROM   Manual therapy comments performed separately from all other skilled intervetnions    Joint Mobilization grade III inferior and posterior GH mobs at 90 degrees abduction   Passive ROM flexion, abduction, ER    Muscle Energy Technique MET during PROM                   PT Education - 04/27/16 1602    Education provided No          PT Short Term Goals - 04/25/16 1540    PT SHORT TERM GOAL #1   Title Patient to demonstrate at least 100 degrees of L shoulder flexion and abduction in order to assist in reducing pain and improving general function    Baseline 6/26- flexion 133, abduction 112   Time 3   Period Weeks   Status Achieved   PT SHORT TERM GOAL #2   Title Patient to demonstrate 90 degrees L shoulder IR and at least 45 degrees L shoulder ER in order to assist in reducing pain adn improve function    Baseline 6/26- IR WFL, ER 38 at 40 degrees ABD    Time 3   Period  Weeks   Status Partially Met   PT SHORT TERM GOAL #3   Title Patient to demonstrate symmetrical neuromuscular recrutiment of all scapular and shoulder girlde muscular to demosntrate improved function and neuromuscular recruitment for functional task performance    Time 3   Period Weeks   Status Achieved   PT SHORT TERM GOAL #4   Title Patient to experience pain no more than 4/10 L shoulder during all tasks in order to improve task performance and overall QOL    Baseline 6/26- average is about 5/10   Time 3   Period Weeks   Status On-going   PT SHORT TERM GOAL #5   Title Patient to be independent in correctly and consistently performing appropriate HEP, to be updated PRN    Baseline 6/26-reports good compliance    Time 3   Period Weeks   Status Achieved           PT Long Term Goals - 04/25/16 1543    PT LONG TERM GOAL #1   Title Patient to demonstrate at least 160 degrees L shoulder flexion and ABD in order to improve function and assist in improving overall QOL    Time 6   Period Weeks   Status On-going   PT LONG TERM GOAL #2   Title Patient to demonstrate L shoulder IR of 90 degrees and L shoulder ER of at least 80 degrees in order to improve function and assist in improving overall QOL    Time 6   Period Weeks   Status On-going   PT LONG TERM GOAL #3   Title Patient to demonstrate strength at least 4/5 in all tested muscle groups in order to improve function and reduce pain, improve regional stability    Time 6   Period Weeks   Status On-going   PT LONG TERM GOAL #4   Title Patient to report he has been able to use both UEs to dress and wash his hair in order to demosntrate improved general function and ability to perform self-care tasks    Time 6   Period Weeks   Status On-going   PT LONG TERM GOAL #5   Title Patient to report he has been able to return to light duty at work in order to assist in returning to function and improved QOL    Baseline 6/26- has returned  to light duty at work but is still on very light precautions per MD    Time 6   Period Weeks   Status Achieved               Plan - 04/27/16 1603    Clinical Impression   Statement Continued with PROM with MET, also AAROM today; patient continues to respond well to MET, and appears to be slowly progressing ROM wise however does continue to be pain limited at this time. Patient appears to be becoming more motivated today, as he performed extra repetitions without PT cues at this time. Increased focus on GH mobilization in inferior and posterior directions today in order to assist in improving ROM today as well. Otherwise performed functional stretches and exercises for posture today with occasional cues for form.    Rehab Potential Good   PT Frequency 3x / week   PT Duration 4 weeks   PT Treatment/Interventions ADLs/Self Care Home Management;Cryotherapy;Moist Heat;Functional mobility training;Therapeutic activities;Therapeutic exercise;Neuromuscular re-education;Patient/family education;Manual techniques;Passive range of motion;Taping;Electrical Stimulation;Ultrasound   PT Next Visit Plan PROM (block scapula to prevent excessive winging)..AAROM..isometrics..scapular stabilization. Continue MET with ROM. Increase focus on scapular exercises to reduce winging, introduce serratus punches if tolerated.    Consulted and Agree with Plan of Care Patient      Patient will benefit from skilled therapeutic intervention in order to improve the following deficits and impairments:  Hypomobility, Decreased knowledge of precautions, Decreased strength, Increased fascial restricitons, Impaired UE functional use, Pain, Increased muscle spasms, Improper body mechanics, Decreased coordination, Impaired flexibility, Postural dysfunction  Visit Diagnosis: Stiffness of left shoulder, not elsewhere classified  Pain in left shoulder  Muscle weakness (generalized)  Abnormal posture     Problem  List Patient Active Problem List   Diagnosis Date Noted  . Hypokalemia   . Preop cardiovascular exam 02/24/2014  . Apical variant hypertrophic cardiomyopathy ([facility]) 02/24/2014  . Tobacco abuse 02/24/2014  . HYPERTENSION, BENIGN 12/12/2008  . CHEST PAIN-UNSPECIFIED 12/12/2008  . ABNORMAL EKG 12/12/2008    Kristen Unger PT, DPT [phone]  [facility] [address] [place], [place], [address] Phone: [phone]   Fax:  [phone]  Name: [name] Foster MRN: [id number] Date of Birth: 01/28/1956     

## 2016-04-29 ENCOUNTER — Ambulatory Visit (HOSPITAL_COMMUNITY): Payer: Worker's Compensation

## 2016-04-29 ENCOUNTER — Encounter (HOSPITAL_COMMUNITY): Payer: Self-pay

## 2016-04-29 DIAGNOSIS — M25512 Pain in left shoulder: Secondary | ICD-10-CM

## 2016-04-29 DIAGNOSIS — R293 Abnormal posture: Secondary | ICD-10-CM

## 2016-04-29 DIAGNOSIS — M25612 Stiffness of left shoulder, not elsewhere classified: Secondary | ICD-10-CM

## 2016-04-29 DIAGNOSIS — M6281 Muscle weakness (generalized): Secondary | ICD-10-CM

## 2016-04-29 NOTE — Therapy (Signed)
[place], [place], [address] Phone: [phone]   Fax:  [phone]  Physical Therapy Treatment  Patient Details  Name: [name] Foster MRN: [id number] Date of Birth: [birth year] Referring Provider: Milly Jakob   Encounter Date: 04/29/2016      PT End of Session - 04/29/16 1525    Visit Number 24   Number of Visits 34   Date for PT Re-Evaluation 05/23/16   Authorization Type Worker's Comp (36 have been approved)  04/07/2016- 12 more visits have been authorized; week of 6/12- 12 more visits authorized    Authorization Time Period 05/16/95 to 7/89/38; recert done on 1/01; recert done on 7/51   Authorization - Visit Number 24   Authorization - Number of Visits 36   PT Start Time 0258   PT Stop Time 1616   PT Time Calculation (min) 59 min   Activity Tolerance Patient tolerated treatment well   Behavior During Therapy [facility] for tasks assessed/performed      Past Medical History  Diagnosis Date  . Benign essential hypertension   . Abnormal EKG   . Tobacco abuse   . Apical variant hypertrophic cardiomyopathy ([place])   . Hypokalemia   . BPH (benign prostatic hyperplasia)     Past Surgical History  Procedure Laterality Date  . Cardiac catheterization      2008  . Kidney surgery Right April 2015    benign tumor removal  . Colonoscopy N/A 02/26/2015    Procedure: COLONOSCOPY;  Surgeon: Rogene Houston, MD;  [facility];  Service: Endoscopy;  Laterality: N/A;  830  . Prostate ablation  12-2015 at [facility]  . Shoulder arthroscopy with rotator cuff repair and subacromial decompression Left 01/18/2016    Procedure: LEFT SHOULDER ARTHROSCOPY WITH ROTATOR CUFF REPAIR AND SUBACROMIAL DECOMPRESSION;  Surgeon: Milly Jakob, MD;  Location: [place];  Service: Orthopedics;  Laterality: Left;  PRE-OP BLOCK WITH GENERAL ANESTHESIA  . Shoulder acromioplasty Left 01/18/2016    Procedure: SHOULDER  ACROMIOPLASTY;  Surgeon: Milly Jakob, MD;  Location: [place];  Service: Orthopedics;  Laterality: Left;    There were no vitals filed for this visit.      Subjective Assessment - 04/29/16 1522    Subjective Pt reports MD apt yesterday about Rt shoulder, stated he had to receive steroid shot for pain.  Reports increased Lt shoulder feels related to the weather.  Pain scale 6 1/2 Bil shoulders   Pertinent History HTN, tobacco abuse, hx of cardiac cath    Patient Stated Goals get back to job activites (very active)   Currently in Pain? Yes   Pain Score 6    Pain Location Shoulder   Pain Orientation Right;Left   Pain Descriptors / Indicators Sore;Aching   Pain Type Surgical pain   Pain Radiating Towards running down to halfway down humerus/around distal deltoid insertion L UE   Pain Onset More than a month ago   Pain Frequency Constant   Aggravating Factors  moving and activity    Pain Relieving Factors pain pills   Effect of Pain on Daily Activities cannot do ADLs or full duties at work              [facility] Adult PT Treatment/Exercise - 04/29/16 0001    Shoulder Exercises: Supine   External Rotation PROM;AAROM   External Rotation Limitations x15 AAROM   Flexion PROM;AAROM   Flexion Limitations x15 AAROM    ABduction  PROM;AAROM   ABduction Limitations x15 AAROM    Shoulder Exercises: Standing   Other Standing Exercises AAROM with PCP for flexion 10x   Moist Heat Therapy   Number Minutes Moist Heat 10 Minutes   Moist Heat Location Cervical;Shoulder  EOS for pain control   Manual Therapy   Manual Therapy Joint mobilization;Passive ROM;Soft tissue mobilization;Myofascial release   Manual therapy comments performed separately from all other skilled intervetnions    Joint Mobilization grade III inferior and posterior GH mobs at 90 degrees abduction   Soft tissue mobilization Lt lat   Myofascial Release surgical incision/scar tissue   Scapular Mobilization  S<>I, protraction<>retraction   Passive ROM flexion, abduction, ER    Muscle Energy Technique MET during PROM                   PT Short Term Goals - 04/25/16 1540    PT SHORT TERM GOAL #1   Title Patient to demonstrate at least 100 degrees of L shoulder flexion and abduction in order to assist in reducing pain and improving general function    Baseline 6/26- flexion 133, abduction 112   Time 3   Period Weeks   Status Achieved   PT SHORT TERM GOAL #2   Title Patient to demonstrate 90 degrees L shoulder IR and at least 45 degrees L shoulder ER in order to assist in reducing pain adn improve function    Baseline 6/26- IR WFL, ER 38 at 40 degrees ABD    Time 3   Period Weeks   Status Partially Met   PT SHORT TERM GOAL #3   Title Patient to demonstrate symmetrical neuromuscular recrutiment of all scapular and shoulder girlde muscular to demosntrate improved function and neuromuscular recruitment for functional task performance    Time 3   Period Weeks   Status Achieved   PT SHORT TERM GOAL #4   Title Patient to experience pain no more than 4/10 L shoulder during all tasks in order to improve task performance and overall QOL    Baseline 6/26- average is about 5/10   Time 3   Period Weeks   Status On-going   PT SHORT TERM GOAL #5   Title Patient to be independent in correctly and consistently performing appropriate HEP, to be updated PRN    Baseline 6/26-reports good compliance    Time 3   Period Weeks   Status Achieved           PT Long Term Goals - 04/25/16 1543    PT LONG TERM GOAL #1   Title Patient to demonstrate at least 160 degrees L shoulder flexion and ABD in order to improve function and assist in improving overall QOL    Time 6   Period Weeks   Status On-going   PT LONG TERM GOAL #2   Title Patient to demonstrate L shoulder IR of 90 degrees and L shoulder ER of at least 80 degrees in order to improve function and assist in improving overall QOL    Time  6   Period Weeks   Status On-going   PT LONG TERM GOAL #3   Title Patient to demonstrate strength at least 4/5 in all tested muscle groups in order to improve function and reduce pain, improve regional stability    Time 6   Period Weeks   Status On-going   PT LONG TERM GOAL #4   Title Patient to report he has been able to use both UEs  to dress and wash his hair in order to demosntrate improved general function and ability to perform self-care tasks    Time 6   Period Weeks   Status On-going   PT LONG TERM GOAL #5   Title Patient to report he has been able to return to light duty at work in order to assist in returning to function and improved QOL    Baseline 6/26- has returned to light duty at work but is still on very light precautions per MD    Time [place] - 04/29/16 1852    Clinical Impression Statement Session focus on improvng ROM with manual techniques include myofascial release technqiues for tight fascial anterior UE, PROM per pt tolerance with scapula districtions to assist with pain and MET techqiues to assist wtih range, soft tissue mobiliazation to reduce overall tightness and AAROM for ROM and strengthening.  Pt continues to be limited by pain and decreased ROM all directions though is making improvements with AAROM.  Added AAROM with PCP pipe with ability to flex shoulder to 115 degrees.  Ended session with [facility] for pain control with reports of pain reduced from 6.5/10 to 5/10.     Rehab Potential Good   PT Frequency 3x / week   PT Duration 4 weeks   PT Treatment/Interventions ADLs/Self Care Home Management;Cryotherapy;Moist Heat;Functional mobility training;Therapeutic activities;Therapeutic exercise;Neuromuscular re-education;Patient/family education;Manual techniques;Passive range of motion;Taping;Electrical Stimulation;Ultrasound   PT Next Visit Plan PROM (block scapula to prevent excessive  winging).Sinclair Ship.Marland Kitchenisometrics.Marland Kitchenscapular stabilization. Continue MET with ROM. Increase focus on scapular exercises to reduce winging, introduce serratus punches if tolerated.       Patient will benefit from skilled therapeutic intervention in order to improve the following deficits and impairments:  Hypomobility, Decreased knowledge of precautions, Decreased strength, Increased fascial restricitons, Impaired UE functional use, Pain, Increased muscle spasms, Improper body mechanics, Decreased coordination, Impaired flexibility, Postural dysfunction  Visit Diagnosis: Stiffness of left shoulder, not elsewhere classified  Pain in left shoulder  Muscle weakness (generalized)  Abnormal posture     Problem List Patient Active Problem List   Diagnosis Date Noted  . Hypokalemia   . Preop cardiovascular exam 02/24/2014  . Apical variant hypertrophic cardiomyopathy ([place]) 02/24/2014  . Tobacco abuse 02/24/2014  . HYPERTENSION, BENIGN 12/12/2008  . CHEST PAIN-UNSPECIFIED 12/12/2008  . ABNORMAL EKG 12/12/2008   Ihor Austin, LPTA; CBIS [phone]' Aldona Lento 04/29/2016, 6:58 PM  [place] [address] [place], [place], [address] Phone: [phone]   Fax:  [phone]  Name: [name] Foster MRN: [id number] Date of Birth: 09-Sep-1956

## 2016-05-04 ENCOUNTER — Ambulatory Visit (HOSPITAL_COMMUNITY): Payer: Worker's Compensation | Attending: Orthopedic Surgery

## 2016-05-04 DIAGNOSIS — R293 Abnormal posture: Secondary | ICD-10-CM

## 2016-05-04 DIAGNOSIS — M25512 Pain in left shoulder: Secondary | ICD-10-CM

## 2016-05-04 DIAGNOSIS — M6281 Muscle weakness (generalized): Secondary | ICD-10-CM | POA: Insufficient documentation

## 2016-05-04 DIAGNOSIS — M25612 Stiffness of left shoulder, not elsewhere classified: Secondary | ICD-10-CM

## 2016-05-04 NOTE — Therapy (Signed)
[place], [place], [address] Phone: [phone]   Fax:  [phone]  Physical Therapy Treatment  Patient Details  Name: [name] Foster MRN: [id number] Date of Birth: Mar 13, 1956 Referring Provider: Milly Jakob   Encounter Date: 05/04/2016      PT End of Session - 05/04/16 1632    Visit Number 25   Number of Visits 34   Date for PT Re-Evaluation 05/23/16   Authorization Type Worker's Comp (36 have been approved)  04/07/2016- 12 more visits have been authorized; week of 6/12- 12 more visits authorized    Authorization Time Period 11/14/70 to 04/19/34; recert done on 5/97; recert done on 4/16   Authorization - Visit Number 25   Authorization - Number of Visits 36   PT Start Time 1430   PT Stop Time 1517   PT Time Calculation (min) 47 min   Activity Tolerance Patient tolerated treatment well   Behavior During Therapy [facility] for tasks assessed/performed      Past Medical History  Diagnosis Date  . Benign essential hypertension   . Abnormal EKG   . Tobacco abuse   . Apical variant hypertrophic cardiomyopathy ([place])   . Hypokalemia   . BPH (benign prostatic hyperplasia)     Past Surgical History  Procedure Laterality Date  . Cardiac catheterization      2008  . Kidney surgery Right April 2015    benign tumor removal  . Colonoscopy N/A 02/26/2015    Procedure: COLONOSCOPY;  Surgeon: Rogene Houston, MD;  [facility];  Service: Endoscopy;  Laterality: N/A;  830  . Prostate ablation  12-2015 at [facility]  . Shoulder arthroscopy with rotator cuff repair and subacromial decompression Left 01/18/2016    Procedure: LEFT SHOULDER ARTHROSCOPY WITH ROTATOR CUFF REPAIR AND SUBACROMIAL DECOMPRESSION;  Surgeon: Milly Jakob, MD;  Location: [place];  Service: Orthopedics;  Laterality: Left;  PRE-OP BLOCK WITH GENERAL ANESTHESIA  . Shoulder acromioplasty Left 01/18/2016    Procedure: SHOULDER  ACROMIOPLASTY;  Surgeon: Milly Jakob, MD;  Location: [place];  Service: Orthopedics;  Laterality: Left;    There were no vitals filed for this visit.      Subjective Assessment - 05/04/16 1630    Subjective Pt reports he started riding lawn mover with work on Monday, pain scale 5/10 Lt shoulder.  Rt shoulder bothers pt. more at night.   Pertinent History HTN, tobacco abuse, hx of cardiac cath    Patient Stated Goals get back to job activites (very active)   Currently in Pain? Yes   Pain Score 5    Pain Location Shoulder   Pain Orientation Left   Pain Descriptors / Indicators Aching   Pain Type Surgical pain   Pain Radiating Towards running down to halfway down humerus/around distal deltoid insertion L UE   Pain Onset More than a month ago   Pain Frequency Constant   Aggravating Factors  moving and activity   Pain Relieving Factors pain pills   Effect of Pain on Daily Activities cannot do ADLs or full duties at work               [facility] Adult PT Treatment/Exercise - 05/04/16 0001    Shoulder Exercises: Supine   External Rotation PROM;AAROM   External Rotation Limitations x15 AAROM   Flexion PROM;AAROM   Flexion Limitations x15 AAROM    ABduction PROM;AAROM   ABduction Limitations x15 AAROM  Shoulder Exercises: Standing   Flexion AAROM;Left;15 reps   Shoulder Flexion Weight (lbs) sliding on counter top with 5" holds 130degrees    ABduction Left;AAROM;15 reps   Shoulder ABduction Weight (lbs) sliding on counter top 5" holds 130 degrees   Other Standing Exercises AAROM with PCP for flexion 15x   Moist Heat Therapy   Number Minutes Moist Heat 10 Minutes   Moist Heat Location Cervical;Shoulder  during PROM    Manual Therapy   Manual Therapy Joint mobilization;Passive ROM;Soft tissue mobilization;Myofascial release   Manual therapy comments performed separately from all other skilled intervetnions    Joint Mobilization grade III inferior and  posterior GH mobs at 90 degrees abduction   Soft tissue mobilization Lt lat   Myofascial Release surgical incision/scar tissue   Scapular Mobilization S<>I, protraction<>retraction   Passive ROM flexion, abduction, ER    Muscle Energy Technique MET during PROM             PT Short Term Goals - 04/25/16 1540    PT SHORT TERM GOAL #1   Title Patient to demonstrate at least 100 degrees of L shoulder flexion and abduction in order to assist in reducing pain and improving general function    Baseline 6/26- flexion 133, abduction 112   Time 3   Period Weeks   Status Achieved   PT SHORT TERM GOAL #2   Title Patient to demonstrate 90 degrees L shoulder IR and at least 45 degrees L shoulder ER in order to assist in reducing pain adn improve function    Baseline 6/26- IR WFL, ER 38 at 40 degrees ABD    Time 3   Period Weeks   Status Partially Met   PT SHORT TERM GOAL #3   Title Patient to demonstrate symmetrical neuromuscular recrutiment of all scapular and shoulder girlde muscular to demosntrate improved function and neuromuscular recruitment for functional task performance    Time 3   Period Weeks   Status Achieved   PT SHORT TERM GOAL #4   Title Patient to experience pain no more than 4/10 L shoulder during all tasks in order to improve task performance and overall QOL    Baseline 6/26- average is about 5/10   Time 3   Period Weeks   Status On-going   PT SHORT TERM GOAL #5   Title Patient to be independent in correctly and consistently performing appropriate HEP, to be updated PRN    Baseline 6/26-reports good compliance    Time 3   Period Weeks   Status Achieved           PT Long Term Goals - 04/25/16 1543    PT LONG TERM GOAL #1   Title Patient to demonstrate at least 160 degrees L shoulder flexion and ABD in order to improve function and assist in improving overall QOL    Time 6   Period Weeks   Status On-going   PT LONG TERM GOAL #2   Title Patient to demonstrate  L shoulder IR of 90 degrees and L shoulder ER of at least 80 degrees in order to improve function and assist in improving overall QOL    Time 6   Period Weeks   Status On-going   PT LONG TERM GOAL #3   Title Patient to demonstrate strength at least 4/5 in all tested muscle groups in order to improve function and reduce pain, improve regional stability    Time 6   Period Weeks   Status  On-going   PT LONG TERM GOAL #4   Title Patient to report he has been able to use both UEs to dress and wash his hair in order to demosntrate improved general function and ability to perform self-care tasks    Time 6   Period Weeks   Status On-going   PT LONG TERM GOAL #5   Title Patient to report he has been able to return to light duty at work in order to assist in returning to function and improved QOL    Baseline 6/26- has returned to light duty at work but is still on very light precautions per MD    Time 6   Period Weeks   Status Achieved               Plan - 05/04/16 1721    Clinical Impression Statement Pt continuePt continues to present with joint and muscle restrictions throughout his LUE.  Session focus on manual technqiues to reduce fascial and musculature restrcitons on scapular musculature, noted trigger point Lt Lat.  Pt reports relief followng manual.  AAROM makiang small improvements with abiility to flex and abduct to 130 degrees with therapist facilitation to reduce body compensations.     Rehab Potential Good   PT Frequency 3x / week   PT Duration 4 weeks   PT Treatment/Interventions ADLs/Self Care Home Management;Cryotherapy;Moist Heat;Functional mobility training;Therapeutic activities;Therapeutic exercise;Neuromuscular re-education;Patient/family education;Manual techniques;Passive range of motion;Taping;Electrical Stimulation;Ultrasound   PT Next Visit Plan PROM (block scapula to prevent excessive winging).Sinclair Ship.Marland Kitchenisometrics.Marland Kitchenscapular stabilization. Continue MET with ROM.  Increase focus on scapular exercises to reduce winging, introduce serratus punches if tolerated.       Patient will benefit from skilled therapeutic intervention in order to improve the following deficits and impairments:  Hypomobility, Decreased knowledge of precautions, Decreased strength, Increased fascial restricitons, Impaired UE functional use, Pain, Increased muscle spasms, Improper body mechanics, Decreased coordination, Impaired flexibility, Postural dysfunction  Visit Diagnosis: Stiffness of left shoulder, not elsewhere classified  Pain in left shoulder  Muscle weakness (generalized)  Abnormal posture     Problem List Patient Active Problem List   Diagnosis Date Noted  . Hypokalemia   . Preop cardiovascular exam 02/24/2014  . Apical variant hypertrophic cardiomyopathy ([place]) 02/24/2014  . Tobacco abuse 02/24/2014  . HYPERTENSION, BENIGN 12/12/2008  . CHEST PAIN-UNSPECIFIED 12/12/2008  . ABNORMAL EKG 12/12/2008   Ihor Austin, LPTA; [place]  Aldona Lento 05/04/2016, 5:27 PM  [place], [place], [address] Phone: [phone]   Fax:  [phone]  Name: [name] Foster MRN: [id number] Date of Birth: 1956-10-02

## 2016-05-05 ENCOUNTER — Ambulatory Visit (HOSPITAL_COMMUNITY): Payer: Worker's Compensation

## 2016-05-05 DIAGNOSIS — M6281 Muscle weakness (generalized): Secondary | ICD-10-CM

## 2016-05-05 DIAGNOSIS — M25512 Pain in left shoulder: Secondary | ICD-10-CM

## 2016-05-05 DIAGNOSIS — M25612 Stiffness of left shoulder, not elsewhere classified: Secondary | ICD-10-CM

## 2016-05-05 DIAGNOSIS — R293 Abnormal posture: Secondary | ICD-10-CM

## 2016-05-05 NOTE — Therapy (Signed)
[facility] [address] [place], [place], [address] Phone: [phone]   Fax:  [phone]  Physical Therapy Treatment  Patient Details  Name: [name] Foster MRN: [id number] Date of Birth: [birth year] Referring Provider: Milly Jakob   Encounter Date: 05/05/2016      PT End of Session - 05/05/16 1526    Visit Number 26   Number of Visits 34   Date for PT Re-Evaluation 05/23/16   Authorization Type Worker's Comp (36 have been approved)  04/07/2016- 12 more visits have been authorized; week of 6/12- 12 more visits authorized    Authorization Time Period 3/35/45 to 04/24/62; recert done on 8/93; recert done on 7/34   Authorization - Visit Number 29   Authorization - Number of Visits 36   PT Start Time 1518   PT Stop Time 1600   PT Time Calculation (min) 42 min   Activity Tolerance Patient tolerated treatment well   Behavior During Therapy [facility] for tasks assessed/performed      Past Medical History  Diagnosis Date  . Benign essential hypertension   . Abnormal EKG   . Tobacco abuse   . Apical variant hypertrophic cardiomyopathy ([place])   . Hypokalemia   . BPH (benign prostatic hyperplasia)     Past Surgical History  Procedure Laterality Date  . Cardiac catheterization      2008  . Kidney surgery Right April 2015    benign tumor removal  . Colonoscopy N/A 02/26/2015    Procedure: COLONOSCOPY;  Surgeon: Rogene Houston, MD;  [facility];  Service: Endoscopy;  Laterality: N/A;  830  . Prostate ablation  12-2015 at [facility]  . Shoulder arthroscopy with rotator cuff repair and subacromial decompression Left 01/18/2016    Procedure: LEFT SHOULDER ARTHROSCOPY WITH ROTATOR CUFF REPAIR AND SUBACROMIAL DECOMPRESSION;  Surgeon: Milly Jakob, MD;  Location: [facility];  Service: Orthopedics;  Laterality: Left;  PRE-OP BLOCK WITH GENERAL ANESTHESIA  . Shoulder acromioplasty Left 01/18/2016    Procedure: SHOULDER  ACROMIOPLASTY;  Surgeon: Milly Jakob, MD;  Location: [facility];  Service: Orthopedics;  Laterality: Left;    There were no vitals filed for this visit.      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 Yes   Pain Score 5    Pain Location Shoulder   Pain Orientation Left   Pain Descriptors / Indicators Aching   Pain Type Surgical pain   Pain Radiating Towards running down to halfway down humerus/around distal deltoid insertion L UE   Pain Onset More than a month ago   Pain Frequency Constant   Aggravating Factors  moving and activity   Pain Relieving Factors pain pills   Effect of Pain on Daily Activities cannot do ADLs or full duties at work             [facility] Adult PT Treatment/Exercise - 05/05/16 0001    Shoulder Exercises: Supine   External Rotation PROM;AAROM   External Rotation Limitations x15 AAROM   Flexion PROM;AAROM   Flexion Limitations x15 AAROM    ABduction PROM;AAROM   ABduction Limitations x15 AAROM    Shoulder Exercises: Standing   Flexion AAROM;Left;15 reps   Shoulder Flexion Weight (lbs) sliding on counter top with 5" holds 130degrees    ABduction Left;AAROM;15 reps   Shoulder ABduction Weight (  lbs) sliding on counter top 5" holds 130 degrees   Other Standing Exercises AAROM with PCP for flexion 15x   Manual Therapy   Manual Therapy Joint mobilization;Passive ROM;Soft tissue mobilization;Myofascial release   Manual therapy comments performed separately from all other skilled intervetnions    Joint Mobilization grade III inferior and posterior GH mobs at 90 degrees abduction   Soft tissue mobilization Lt lat   Myofascial Release surgical incision/scar tissue   Scapular Mobilization S<>I, protraction<>retraction   Passive ROM flexion, abduction, ER    Muscle Energy Technique MET during PROM                    PT Short Term Goals - 04/25/16 1540    PT SHORT TERM GOAL #1   Title Patient to demonstrate at least 100 degrees of L shoulder flexion and abduction in order to assist in reducing pain and improving general function    Baseline 6/26- flexion 133, abduction 112   Time 3   Period Weeks   Status Achieved   PT SHORT TERM GOAL #2   Title Patient to demonstrate 90 degrees L shoulder IR and at least 45 degrees L shoulder ER in order to assist in reducing pain adn improve function    Baseline 6/26- IR WFL, ER 38 at 40 degrees ABD    Time 3   Period Weeks   Status Partially Met   PT SHORT TERM GOAL #3   Title Patient to demonstrate symmetrical neuromuscular recrutiment of all scapular and shoulder girlde muscular to demosntrate improved function and neuromuscular recruitment for functional task performance    Time 3   Period Weeks   Status Achieved   PT SHORT TERM GOAL #4   Title Patient to experience pain no more than 4/10 L shoulder during all tasks in order to improve task performance and overall QOL    Baseline 6/26- average is about 5/10   Time 3   Period Weeks   Status On-going   PT SHORT TERM GOAL #5   Title Patient to be independent in correctly and consistently performing appropriate HEP, to be updated PRN    Baseline 6/26-reports good compliance    Time 3   Period Weeks   Status Achieved           PT Long Term Goals - 04/25/16 1543    PT LONG TERM GOAL #1   Title Patient to demonstrate at least 160 degrees L shoulder flexion and ABD in order to improve function and assist in improving overall QOL    Time 6   Period Weeks   Status On-going   PT LONG TERM GOAL #2   Title Patient to demonstrate L shoulder IR of 90 degrees and L shoulder ER of at least 80 degrees in order to improve function and assist in improving overall QOL    Time 6   Period Weeks   Status On-going   PT LONG TERM GOAL #3   Title Patient to demonstrate strength at least 4/5 in all tested muscle  groups in order to improve function and reduce pain, improve regional stability    Time 6   Period Weeks   Status On-going   PT LONG TERM GOAL #4   Title Patient to report he has been able to use both UEs to dress and wash his hair in order to demosntrate improved general function and ability to perform self-care tasks    Time 6   Period Weeks  Status On-going   PT LONG TERM GOAL #5   Title Patient to report he has been able to return to light duty at work in order to assist in returning to function and improved QOL    Baseline 6/26- has returned to light duty at work but is still on very light precautions per MD    Time 6   Period Weeks   Status Achieved               Plan - 05/05/16 1609    Clinical Impression Statement Pt continues to present wiht joint and musculature restrictins throughout his Lt UE, scapular musculature and lats.  Session focus on manual techniques to reduce fascial and musculature restrictions and PROM to improve flexion, abduction and ER.  Therex focus on AAROM with therapist facilitation to reduce compensations.   Rehab Potential Good   PT Frequency 3x / week   PT Duration 4 weeks   PT Treatment/Interventions ADLs/Self Care Home Management;Cryotherapy;Moist Heat;Functional mobility training;Therapeutic activities;Therapeutic exercise;Neuromuscular re-education;Patient/family education;Manual techniques;Passive range of motion;Taping;Electrical Stimulation;Ultrasound   PT Next Visit Plan PROM (block scapula to prevent excessive winging).Sinclair Ship.Marland Kitchenisometrics.Marland Kitchenscapular stabilization. Continue MET with ROM. Increase focus on scapular exercises to reduce winging, introduce serratus punches if tolerated.       Patient will benefit from skilled therapeutic intervention in order to improve the following deficits and impairments:  Hypomobility, Decreased knowledge of precautions, Decreased strength, Increased fascial restricitons, Impaired UE functional use, Pain,  Increased muscle spasms, Improper body mechanics, Decreased coordination, Impaired flexibility, Postural dysfunction  Visit Diagnosis: Stiffness of left shoulder, not elsewhere classified  Pain in left shoulder  Muscle weakness (generalized)  Abnormal posture     Problem List Patient Active Problem List   Diagnosis Date Noted  . Hypokalemia   . Preop cardiovascular exam 02/24/2014  . Apical variant hypertrophic cardiomyopathy ([place]) 02/24/2014  . Tobacco abuse 02/24/2014  . HYPERTENSION, BENIGN 12/12/2008  . CHEST PAIN-UNSPECIFIED 12/12/2008  . ABNORMAL EKG 12/12/2008   Ihor Austin, LPTA; [place]  Aldona Lento 05/05/2016, 4:23 PM  [facility] [address] [place], [place], [address] Phone: [phone]   Fax:  [phone]  Name: [name] Foster MRN: [id number] Date of Birth: 10-16-1956

## 2016-05-09 ENCOUNTER — Ambulatory Visit (HOSPITAL_COMMUNITY): Payer: Worker's Compensation | Admitting: Physical Therapy

## 2016-05-09 DIAGNOSIS — M25612 Stiffness of left shoulder, not elsewhere classified: Secondary | ICD-10-CM | POA: Diagnosis not present

## 2016-05-09 DIAGNOSIS — M6281 Muscle weakness (generalized): Secondary | ICD-10-CM

## 2016-05-09 DIAGNOSIS — M25512 Pain in left shoulder: Secondary | ICD-10-CM

## 2016-05-09 DIAGNOSIS — R293 Abnormal posture: Secondary | ICD-10-CM

## 2016-05-09 NOTE — Therapy (Signed)
[facility] [place], [place], [address] Phone: [phone]   Fax:  [phone]  Physical Therapy Treatment  Patient Details  Name: [name] Foster MRN: [id number] Date of Birth: 01-08-1956 Referring Provider: Milly Jakob   Encounter Date: 05/09/2016      PT End of Session - 05/09/16 1741    Visit Number 27   Number of Visits 34   Date for PT Re-Evaluation 05/23/16   Authorization Type Worker's Comp (36 have been approved)  04/07/2016- 12 more visits have been authorized; week of 6/12- 12 more visits authorized    Authorization Time Period 7/79/39 to 0/30/09; recert done on 2/33; recert done on 0/07   Authorization - Visit Number 75   Authorization - Number of Visits 36   PT Start Time 6226   PT Stop Time 1728   PT Time Calculation (min) 38 min   Activity Tolerance Patient tolerated treatment well   Behavior During Therapy [facility] for tasks assessed/performed      Past Medical History  Diagnosis Date  . Benign essential hypertension   . Abnormal EKG   . Tobacco abuse   . Apical variant hypertrophic cardiomyopathy ([place])   . Hypokalemia   . BPH (benign prostatic hyperplasia)     Past Surgical History  Procedure Laterality Date  . Cardiac catheterization      2008  . Kidney surgery Right April 2015    benign tumor removal  . Colonoscopy N/A 02/26/2015    Procedure: COLONOSCOPY;  Surgeon: Rogene Houston, MD;  [facility];  Service: Endoscopy;  Laterality: N/A;  830  . Prostate ablation  12-2015 at [facility]  . Shoulder arthroscopy with rotator cuff repair and subacromial decompression Left 01/18/2016    Procedure: LEFT SHOULDER ARTHROSCOPY WITH ROTATOR CUFF REPAIR AND SUBACROMIAL DECOMPRESSION;  Surgeon: Milly Jakob, MD;  Location: [facility];  Service: Orthopedics;  Laterality: Left;  PRE-OP BLOCK WITH GENERAL ANESTHESIA  . Shoulder acromioplasty Left 01/18/2016    Procedure: SHOULDER  ACROMIOPLASTY;  Surgeon: Milly Jakob, MD;  Location: [facility];  Service: Orthopedics;  Laterality: Left;    There were no vitals filed for this visit.      Subjective Assessment - 05/09/16 1651    Subjective Patient reports he was supposed to go to [place] today but he is on vacation and now patient is not going back until 8/7. The doctor said that he does have an injury in his R shoulder and gave him a shot in the joint. He does not notice a difference since having the shot in his shoulder.    Pertinent History HTN, tobacco abuse, hx of cardiac cath    Patient Stated Goals get back to job activites (very active)   Currently in Pain? Yes   Pain Score 5    Pain Location Shoulder   Pain Orientation Left            [facility] PT Assessment - 05/09/16 0001    AROM   Left Shoulder Flexion 128 Degrees   Left Shoulder ABduction 115 Degrees   Left Shoulder External Rotation 43 Degrees                     [facility] Adult PT Treatment/Exercise - 05/09/16 0001    Shoulder Exercises: Supine   External Rotation PROM;AAROM   External Rotation Limitations x20 AAROM    Flexion PROM;AAROM   Flexion Limitations x20 AAROM   ABduction  PROM;AAROM   ABduction Limitations x20 AAROM    Shoulder Exercises: Standing   Other Standing Exercises shoulder pendulums x2 minutes each direction    Other Standing Exercises latearl stretches for shoulder flexion and abduction at table 6x15 seconds                 PT Education - 05/09/16 1740    Education provided Yes   Education Details increase how often he is using heat on his shoulder to assist in loosening tissues    Person(s) Educated Patient   Methods Explanation   Comprehension Verbalized understanding          PT Short Term Goals - 04/25/16 1540    PT SHORT TERM GOAL #1   Title Patient to demonstrate at least 100 degrees of L shoulder flexion and abduction in order to assist in reducing pain and improving general  function    Baseline 6/26- flexion 133, abduction 112   Time 3   Period Weeks   Status Achieved   PT SHORT TERM GOAL #2   Title Patient to demonstrate 90 degrees L shoulder IR and at least 45 degrees L shoulder ER in order to assist in reducing pain adn improve function    Baseline 6/26- IR WFL, ER 38 at 40 degrees ABD    Time 3   Period Weeks   Status Partially Met   PT SHORT TERM GOAL #3   Title Patient to demonstrate symmetrical neuromuscular recrutiment of all scapular and shoulder girlde muscular to demosntrate improved function and neuromuscular recruitment for functional task performance    Time 3   Period Weeks   Status Achieved   PT SHORT TERM GOAL #4   Title Patient to experience pain no more than 4/10 L shoulder during all tasks in order to improve task performance and overall QOL    Baseline 6/26- average is about 5/10   Time 3   Period Weeks   Status On-going   PT SHORT TERM GOAL #5   Title Patient to be independent in correctly and consistently performing appropriate HEP, to be updated PRN    Baseline 6/26-reports good compliance    Time 3   Period Weeks   Status Achieved           PT Long Term Goals - 04/25/16 1543    PT LONG TERM GOAL #1   Title Patient to demonstrate at least 160 degrees L shoulder flexion and ABD in order to improve function and assist in improving overall QOL    Time 6   Period Weeks   Status On-going   PT LONG TERM GOAL #2   Title Patient to demonstrate L shoulder IR of 90 degrees and L shoulder ER of at least 80 degrees in order to improve function and assist in improving overall QOL    Time 6   Period Weeks   Status On-going   PT LONG TERM GOAL #3   Title Patient to demonstrate strength at least 4/5 in all tested muscle groups in order to improve function and reduce pain, improve regional stability    Time 6   Period Weeks   Status On-going   PT LONG TERM GOAL #4   Title Patient to report he has been able to use both UEs to  dress and wash his hair in order to demosntrate improved general function and ability to perform self-care tasks    Time 6   Period Weeks   Status On-going  PT LONG TERM GOAL #5   Title Patient to report he has been able to return to light duty at work in order to assist in returning to function and improved QOL    Baseline 6/26- has returned to light duty at work but is still on very light precautions per MD    Time 6   Period Weeks   Status Achieved               Plan - 05/09/16 1741    Clinical Impression Statement Continued to work on ROM exercises in PROM and AAROM for flexion, ER, and ABD; patient continues to be limited by soft tissue and joint mobility at this time, also continues to compensate quite a bit during ROM based activities with trunk/thoracic motions. Checked PROM measures today with no significant change in ROM. Encouraged patient to use heat more often over his shoulder area however to use caution with electric heat due to burn risk.     Rehab Potential Good   PT Frequency 3x / week   PT Duration 4 weeks   PT Treatment/Interventions ADLs/Self Care Home Management;Cryotherapy;Moist Heat;Functional mobility training;Therapeutic activities;Therapeutic exercise;Neuromuscular re-education;Patient/family education;Manual techniques;Passive range of motion;Taping;Electrical Stimulation;Ultrasound   PT Next Visit Plan PROM (block scapula to prevent excessive winging).Sinclair Ship.Marland Kitchenisometrics.Marland Kitchenscapular stabilization. Continue MET with ROM. Increase focus on scapular exercises to reduce winging, introduce serratus punches if tolerated.    Consulted and Agree with Plan of Care Patient      Patient will benefit from skilled therapeutic intervention in order to improve the following deficits and impairments:  Hypomobility, Decreased knowledge of precautions, Decreased strength, Increased fascial restricitons, Impaired UE functional use, Pain, Increased muscle spasms, Improper body  mechanics, Decreased coordination, Impaired flexibility, Postural dysfunction  Visit Diagnosis: Stiffness of left shoulder, not elsewhere classified  Pain in left shoulder  Muscle weakness (generalized)  Abnormal posture     Problem List Patient Active Problem List   Diagnosis Date Noted  . Hypokalemia   . Preop cardiovascular exam 02/24/2014  . Apical variant hypertrophic cardiomyopathy ([place]) 02/24/2014  . Tobacco abuse 02/24/2014  . HYPERTENSION, BENIGN 12/12/2008  . CHEST PAIN-UNSPECIFIED 12/12/2008  . ABNORMAL EKG 12/12/2008    Deniece Ree PT, DPT [facility] [address] [place], [place], [address] Phone: [phone]   Fax:  [phone]  Name: Brayln Duque MRN: [id number] Date of Birth: August 06, 1956

## 2016-05-11 ENCOUNTER — Ambulatory Visit (HOSPITAL_COMMUNITY): Payer: Worker's Compensation | Admitting: Physical Therapy

## 2016-05-11 DIAGNOSIS — R293 Abnormal posture: Secondary | ICD-10-CM

## 2016-05-11 DIAGNOSIS — M25612 Stiffness of left shoulder, not elsewhere classified: Secondary | ICD-10-CM

## 2016-05-11 DIAGNOSIS — M6281 Muscle weakness (generalized): Secondary | ICD-10-CM

## 2016-05-11 DIAGNOSIS — M25512 Pain in left shoulder: Secondary | ICD-10-CM

## 2016-05-11 NOTE — Therapy (Signed)
[place], [place], [address] Phone: [phone]   Fax:  [phone]  Physical Therapy Treatment  Patient Details  Name: [name] Foster MRN: [id number] Date of Birth: 02-02-1956 Referring Provider: Milly Jakob   Encounter Date: 05/11/2016      PT End of Session - 05/11/16 1604    Visit Number 28   Number of Visits 34   Date for PT Re-Evaluation 05/23/16   Authorization Type Worker's Comp (36 have been approved)  04/07/2016- 12 more visits have been authorized; week of 6/12- 12 more visits authorized    Authorization Time Period 9/48/01 to 6/55/37; recert done on 4/82; recert done on 7/07   Authorization - Visit Number 35   Authorization - Number of Visits 36   PT Start Time 1520   PT Stop Time 1558   PT Time Calculation (min) 38 min      Past Medical History  Diagnosis Date  . Benign essential hypertension   . Abnormal EKG   . Tobacco abuse   . Apical variant hypertrophic cardiomyopathy ([place])   . Hypokalemia   . BPH (benign prostatic hyperplasia)     Past Surgical History  Procedure Laterality Date  . Cardiac catheterization      2008  . Kidney surgery Right April 2015    benign tumor removal  . Colonoscopy N/A 02/26/2015    Procedure: COLONOSCOPY;  Surgeon: Rogene Houston, MD;  [facility];  Service: Endoscopy;  Laterality: N/A;  830  . Prostate ablation  12-2015 at [facility]  . Shoulder arthroscopy with rotator cuff repair and subacromial decompression Left 01/18/2016    Procedure: LEFT SHOULDER ARTHROSCOPY WITH ROTATOR CUFF REPAIR AND SUBACROMIAL DECOMPRESSION;  Surgeon: Milly Jakob, MD;  Location: [facility];  Service: Orthopedics;  Laterality: Left;  PRE-OP BLOCK WITH GENERAL ANESTHESIA  . Shoulder acromioplasty Left 01/18/2016    Procedure: SHOULDER ACROMIOPLASTY;  Surgeon: Milly Jakob, MD;  Location: [facility];  Service: Orthopedics;  Laterality: Left;     There were no vitals filed for this visit.      Subjective Assessment - 05/11/16 1520    Subjective Patient reports he has been sore today as he has to use weedeater at work which he finds quite difficult; he is still having trouble with his R UE beign quite painful and having a sharp throbbing quality to it. Nothing else major going on, he plans on telling MD about his painful R shoulder not responding to the shot in his R shoulder.    Pertinent History HTN, tobacco abuse, hx of cardiac cath    Patient Stated Goals get back to job activites (very active)   Currently in Pain? Yes   Pain Score 5    Pain Location Shoulder   Pain Orientation Right;Left   Pain Descriptors / Indicators Aching   Pain Type Surgical pain;Chronic pain   Pain Radiating Towards running down humerus to deltoid insertion    Pain Onset More than a month ago   Pain Frequency Constant   Aggravating Factors  positions at night, really has a hard time finding a good position    Pain Relieving Factors movement to some extent but mostly pain pills    Effect of Pain on Daily Activities still cannot do ADLs or full duties at work  [place] Adult PT Treatment/Exercise - 05/11/16 0001    Shoulder Exercises: Supine   External Rotation PROM;AAROM   External Rotation Limitations x20 AAROM    Flexion PROM;AAROM   Flexion Limitations x20 AAROM   ABduction PROM;AAROM   ABduction Limitations x20 AAROM    Shoulder Exercises: Seated   Other Seated Exercises table slides flexion and ABD 10x10 seconds    Shoulder Exercises: Standing   Other Standing Exercises shoulder pendulums x2 minutes each direction    Other Standing Exercises serratus anterior punches 2x15 0#; wall pushups 2x10 cues for  form   min guard for form serratus punches supine                PT Education - 05/11/16 1604    Education provided No          PT Short Term Goals - 04/25/16 1540    PT SHORT TERM  GOAL #1   Title Patient to demonstrate at least 100 degrees of L shoulder flexion and abduction in order to assist in reducing pain and improving general function    Baseline 6/26- flexion 133, abduction 112   Time 3   Period Weeks   Status Achieved   PT SHORT TERM GOAL #2   Title Patient to demonstrate 90 degrees L shoulder IR and at least 45 degrees L shoulder ER in order to assist in reducing pain adn improve function    Baseline 6/26- IR WFL, ER 38 at 40 degrees ABD    Time 3   Period Weeks   Status Partially Met   PT SHORT TERM GOAL #3   Title Patient to demonstrate symmetrical neuromuscular recrutiment of all scapular and shoulder girlde muscular to demosntrate improved function and neuromuscular recruitment for functional task performance    Time 3   Period Weeks   Status Achieved   PT SHORT TERM GOAL #4   Title Patient to experience pain no more than 4/10 L shoulder during all tasks in order to improve task performance and overall QOL    Baseline 6/26- average is about 5/10   Time 3   Period Weeks   Status On-going   PT SHORT TERM GOAL #5   Title Patient to be independent in correctly and consistently performing appropriate HEP, to be updated PRN    Baseline 6/26-reports good compliance    Time 3   Period Weeks   Status Achieved           PT Long Term Goals - 04/25/16 1543    PT LONG TERM GOAL #1   Title Patient to demonstrate at least 160 degrees L shoulder flexion and ABD in order to improve function and assist in improving overall QOL    Time 6   Period Weeks   Status On-going   PT LONG TERM GOAL #2   Title Patient to demonstrate L shoulder IR of 90 degrees and L shoulder ER of at least 80 degrees in order to improve function and assist in improving overall QOL    Time 6   Period Weeks   Status On-going   PT LONG TERM GOAL #3   Title Patient to demonstrate strength at least 4/5 in all tested muscle groups in order to improve function and reduce pain, improve  regional stability    Time 6   Period Weeks   Status On-going   PT LONG TERM GOAL #4   Title Patient to report he has been able to use both UEs to  dress and wash his hair in order to demosntrate improved general function and ability to perform self-care tasks    Time 6   Period Weeks   Status On-going   PT LONG TERM GOAL #5   Title Patient to report he has been able to return to light duty at work in order to assist in returning to function and improved QOL    Baseline 6/26- has returned to light duty at work but is still on very light precautions per MD    Time 6   Period Weeks   Status Achieved               Plan - 05/11/16 1555    Clinical Impression Statement Continued with general ROM activities, AAROM and PROM today; patient continues to be pain limited during ROM especially during ABD today in both PROM and AAROM. Introduced serratus punches in supine with min guard for form and continued wall pushups to address functional weakness as well. Patient continues to compensate for pain and reduced shoulder ROM with thoracic extension and lateral flexion with ROM based activities.    Rehab Potential Good   PT Frequency 3x / week   PT Duration 4 weeks   PT Treatment/Interventions ADLs/Self Care Home Management;Cryotherapy;Moist Heat;Functional mobility training;Therapeutic activities;Therapeutic exercise;Neuromuscular re-education;Patient/family education;Manual techniques;Passive range of motion;Taping;Electrical Stimulation;Ultrasound   PT Next Visit Plan PROM (block scapula to prevent excessive winging).Sinclair Ship.Marland Kitchenisometrics.Marland Kitchenscapular stabilization. Continue MET with ROM. Increase focus on scapular exercises to reduce winging, introduce serratus punches if tolerated.    PT Home Exercise Plan no updates this visit   Consulted and Agree with Plan of Care Patient      Patient will benefit from skilled therapeutic intervention in order to improve the following deficits and  impairments:  Hypomobility, Decreased knowledge of precautions, Decreased strength, Increased fascial restricitons, Impaired UE functional use, Pain, Increased muscle spasms, Improper body mechanics, Decreased coordination, Impaired flexibility, Postural dysfunction  Visit Diagnosis: Stiffness of left shoulder, not elsewhere classified  Pain in left shoulder  Muscle weakness (generalized)  Abnormal posture     Problem List Patient Active Problem List   Diagnosis Date Noted  . Hypokalemia   . Preop cardiovascular exam 02/24/2014  . Apical variant hypertrophic cardiomyopathy ([place]) 02/24/2014  . Tobacco abuse 02/24/2014  . HYPERTENSION, BENIGN 12/12/2008  . CHEST PAIN-UNSPECIFIED 12/12/2008  . ABNORMAL EKG 12/12/2008    Deniece Ree PT, DPT [facility] [address] [place], [place], [address] Phone: [phone]   Fax:  [phone]  Name: Fynn Vanblarcom MRN: [id number] Date of Birth: [birth year]

## 2016-05-13 ENCOUNTER — Ambulatory Visit (HOSPITAL_COMMUNITY): Payer: Worker's Compensation

## 2016-05-13 DIAGNOSIS — M6281 Muscle weakness (generalized): Secondary | ICD-10-CM

## 2016-05-13 DIAGNOSIS — M25612 Stiffness of left shoulder, not elsewhere classified: Secondary | ICD-10-CM

## 2016-05-13 DIAGNOSIS — R293 Abnormal posture: Secondary | ICD-10-CM

## 2016-05-13 DIAGNOSIS — M25512 Pain in left shoulder: Secondary | ICD-10-CM

## 2016-05-13 NOTE — Therapy (Signed)
[place], [place], [address] Phone: [phone]   Fax:  [phone]  Physical Therapy Treatment  Patient Details  Name: [name] Foster MRN: [id number] Date of Birth: [birth year] Referring Provider: Milly Jakob   Encounter Date: 05/13/2016      PT End of Session - 05/13/16 1529    Visit Number 29   Number of Visits 34   Date for PT Re-Evaluation 05/23/16   Authorization Type Worker's Comp (36 have been approved)  04/07/2016- 12 more visits have been authorized; week of 6/12- 12 more visits authorized    Authorization Time Period 0/34/74 to 2/59/56; recert done on 3/87; recert done on 5/64   Authorization - Visit Number 29   Authorization - Number of Visits 36   PT Start Time 1523   PT Stop Time 1604   PT Time Calculation (min) 41 min   Activity Tolerance Patient tolerated treatment well   Behavior During Therapy [name] for tasks assessed/performed      Past Medical History  Diagnosis Date  . Benign essential hypertension   . Abnormal EKG   . Tobacco abuse   . Apical variant hypertrophic cardiomyopathy ([place])   . Hypokalemia   . BPH (benign prostatic hyperplasia)     Past Surgical History  Procedure Laterality Date  . Cardiac catheterization      2008  . Kidney surgery Right April 2015    benign tumor removal  . Colonoscopy N/A 02/26/2015    Procedure: COLONOSCOPY;  Surgeon: Rogene Houston, MD;  [facility];  Service: Endoscopy;  Laterality: N/A;  830  . Prostate ablation  12-2015 at [facility]  . Shoulder arthroscopy with rotator cuff repair and subacromial decompression Left 01/18/2016    Procedure: LEFT SHOULDER ARTHROSCOPY WITH ROTATOR CUFF REPAIR AND SUBACROMIAL DECOMPRESSION;  Surgeon: Milly Jakob, MD;  Location: [facility];  Service: Orthopedics;  Laterality: Left;  PRE-OP BLOCK WITH GENERAL ANESTHESIA  . Shoulder acromioplasty Left 01/18/2016    Procedure: SHOULDER  ACROMIOPLASTY;  Surgeon: Milly Jakob, MD;  Location: [facility];  Service: Orthopedics;  Laterality: Left;    There were no vitals filed for this visit.      Subjective Assessment - 05/13/16 1528    Subjective Pt stated he had to use the weedeater wtih work today, increased pain following .  Current pain scale 5/10  Bil shoulder   Currently in Pain? Yes   Pain Score 5    Pain Location Shoulder   Pain Orientation Right;Left   Pain Descriptors / Indicators Aching   Pain Type Surgical pain;Chronic pain   Pain Radiating Towards running down humerus to deltoid insertion   Pain Onset More than a month ago   Pain Frequency Constant   Aggravating Factors  positions at night, really has a hard time finding a good position   Pain Relieving Factors movement to some extent but mostly pain pills   Effect of Pain on Daily Activities still cannot do ADLs or full duties at work             [facility] Adult PT Treatment/Exercise - 05/13/16 0001    Shoulder Exercises: Supine   External Rotation PROM;AAROM   External Rotation Limitations x20 AAROM    Flexion PROM;AAROM   Flexion Limitations x20 AAROM   ABduction PROM;AAROM   ABduction Limitations x20 AAROM    Shoulder Exercises: Seated   Other Seated Exercises table slides flexion and ABD 10x10 seconds  Shoulder Exercises: Standing   Flexion AAROM;Left;15 reps   Shoulder Flexion Weight (lbs) sliding on PCP pipe; wall wah   Other Standing Exercises shoulder pendulums x2 minutes each direction    Other Standing Exercises serratus anterior punches 2x15 0#; wall pushups 2x10 cues for  form    Shoulder Exercises: Therapy Ball   Flexion 10 reps   ABduction 10 reps   Right/Left 5 reps   Moist Heat Therapy   Number Minutes Moist Heat 10 Minutes   Moist Heat Location Cervical;Shoulder   Manual Therapy   Manual Therapy Joint mobilization;Passive ROM;Soft tissue mobilization;Myofascial release   Manual therapy comments performed  separately from all other skilled intervetnions    Joint Mobilization grade III inferior and posterior GH mobs at 90 degrees abduction   Soft tissue mobilization Lt lat   Myofascial Release surgical incision/scar tissue   Scapular Mobilization S<>I, protraction<>retraction   Passive ROM flexion, abduction, ER    Muscle Energy Technique MET during PROM                   PT Short Term Goals - 04/25/16 1540    PT SHORT TERM GOAL #1   Title Patient to demonstrate at least 100 degrees of L shoulder flexion and abduction in order to assist in reducing pain and improving general function    Baseline 6/26- flexion 133, abduction 112   Time 3   Period Weeks   Status Achieved   PT SHORT TERM GOAL #2   Title Patient to demonstrate 90 degrees L shoulder IR and at least 45 degrees L shoulder ER in order to assist in reducing pain adn improve function    Baseline 6/26- IR WFL, ER 38 at 40 degrees ABD    Time 3   Period Weeks   Status Partially Met   PT SHORT TERM GOAL #3   Title Patient to demonstrate symmetrical neuromuscular recrutiment of all scapular and shoulder girlde muscular to demosntrate improved function and neuromuscular recruitment for functional task performance    Time 3   Period Weeks   Status Achieved   PT SHORT TERM GOAL #4   Title Patient to experience pain no more than 4/10 L shoulder during all tasks in order to improve task performance and overall QOL    Baseline 6/26- average is about 5/10   Time 3   Period Weeks   Status On-going   PT SHORT TERM GOAL #5   Title Patient to be independent in correctly and consistently performing appropriate HEP, to be updated PRN    Baseline 6/26-reports good compliance    Time 3   Period Weeks   Status Achieved           PT Long Term Goals - 04/25/16 1543    PT LONG TERM GOAL #1   Title Patient to demonstrate at least 160 degrees L shoulder flexion and ABD in order to improve function and assist in improving overall  QOL    Time 6   Period Weeks   Status On-going   PT LONG TERM GOAL #2   Title Patient to demonstrate L shoulder IR of 90 degrees and L shoulder ER of at least 80 degrees in order to improve function and assist in improving overall QOL    Time 6   Period Weeks   Status On-going   PT LONG TERM GOAL #3   Title Patient to demonstrate strength at least 4/5 in all tested muscle groups in order to  improve function and reduce pain, improve regional stability    Time 6   Period Weeks   Status On-going   PT LONG TERM GOAL #4   Title Patient to report he has been able to use both UEs to dress and wash his hair in order to demosntrate improved general function and ability to perform self-care tasks    Time 6   Period Weeks   Status On-going   PT LONG TERM GOAL #5   Title Patient to report he has been able to return to light duty at work in order to assist in returning to function and improved QOL    Baseline 6/26- has returned to light duty at work but is still on very light precautions per MD    Time 6   Period Weeks   Status Achieved               Plan - 05/13/16 1610    Clinical Impression Statement Continued session focus on improving ROM with manual and therex technqiues.  Pt continues to have significant restrictins with scar tissue and limited ROM.with c/o of "muscle catching" over incision point with increased pain.  Added seated AAROM with theraball to improve range and continued with scapular/ serratur anterior strengthening.  EOS pt reports pain increased to 5.5/10.     Rehab Potential Good   PT Frequency 3x / week   PT Duration 4 weeks   PT Treatment/Interventions ADLs/Self Care Home Management;Cryotherapy;Moist Heat;Functional mobility training;Therapeutic activities;Therapeutic exercise;Neuromuscular re-education;Patient/family education;Manual techniques;Passive range of motion;Taping;Electrical Stimulation;Ultrasound   PT Next Visit Plan PROM (block scapula to prevent  excessive winging).Sinclair Ship.Marland Kitchenisometrics.Marland Kitchenscapular stabilization. Continue MET with ROM. Increase focus on scapular exercises to reduce winging, introduce serratus punches if tolerated.   Next session begin thumb tacks for IR/ER and progress wall washing.        Patient will benefit from skilled therapeutic intervention in order to improve the following deficits and impairments:  Hypomobility, Decreased knowledge of precautions, Decreased strength, Increased fascial restricitons, Impaired UE functional use, Pain, Increased muscle spasms, Improper body mechanics, Decreased coordination, Impaired flexibility, Postural dysfunction  Visit Diagnosis: Stiffness of left shoulder, not elsewhere classified  Pain in left shoulder  Muscle weakness (generalized)  Abnormal posture     Problem List Patient Active Problem List   Diagnosis Date Noted  . Hypokalemia   . Preop cardiovascular exam 02/24/2014  . Apical variant hypertrophic cardiomyopathy ([place]) 02/24/2014  . Tobacco abuse 02/24/2014  . HYPERTENSION, BENIGN 12/12/2008  . CHEST PAIN-UNSPECIFIED 12/12/2008  . ABNORMAL EKG 12/12/2008   Ihor Austin, LPTA; [place]  Aldona Lento 05/13/2016, 4:17 PM  [place], [place], [address] Phone: [phone]   Fax:  [phone]  Name: [name] Foster MRN: [id number] Date of Birth: 10-13-1956

## 2016-05-16 ENCOUNTER — Ambulatory Visit (HOSPITAL_COMMUNITY): Payer: Worker's Compensation | Admitting: Physical Therapy

## 2016-05-16 DIAGNOSIS — M25612 Stiffness of left shoulder, not elsewhere classified: Secondary | ICD-10-CM

## 2016-05-16 DIAGNOSIS — R293 Abnormal posture: Secondary | ICD-10-CM

## 2016-05-16 DIAGNOSIS — M25512 Pain in left shoulder: Secondary | ICD-10-CM

## 2016-05-16 DIAGNOSIS — M6281 Muscle weakness (generalized): Secondary | ICD-10-CM

## 2016-05-16 NOTE — Therapy (Signed)
[place], [place], [address] Phone: [phone]   Fax:  [phone]  Physical Therapy Treatment  Patient Details  Name: [name] Foster MRN: [id number] Date of Birth: 05/04/1956 Referring Provider: Milly Jakob   Encounter Date: 05/16/2016      PT End of Session - 05/16/16 1608    Visit Number 30   Number of Visits 34   Date for PT Re-Evaluation 05/23/16   Authorization Type Worker's Comp (36 have been approved)  04/07/2016- 12 more visits have been authorized; week of 6/12- 12 more visits authorized    Authorization Time Period 2/62/03 to 5/59/74; recert done on 1/63; recert done on 8/45   Authorization - Visit Number 29   Authorization - Number of Visits 36   PT Start Time 3646   PT Stop Time 1558   PT Time Calculation (min) 42 min   Activity Tolerance Patient tolerated treatment well   Behavior During Therapy [facility] for tasks assessed/performed      Past Medical History  Diagnosis Date  . Benign essential hypertension   . Abnormal EKG   . Tobacco abuse   . Apical variant hypertrophic cardiomyopathy ([place])   . Hypokalemia   . BPH (benign prostatic hyperplasia)     Past Surgical History  Procedure Laterality Date  . Cardiac catheterization      2008  . Kidney surgery Right April 2015    benign tumor removal  . Colonoscopy N/A 02/26/2015    Procedure: COLONOSCOPY;  Surgeon: Rogene Houston, MD;  [facility];  Service: Endoscopy;  Laterality: N/A;  830  . Prostate ablation  12-2015 at [facility]  . Shoulder arthroscopy with rotator cuff repair and subacromial decompression Left 01/18/2016    Procedure: LEFT SHOULDER ARTHROSCOPY WITH ROTATOR CUFF REPAIR AND SUBACROMIAL DECOMPRESSION;  Surgeon: Milly Jakob, MD;  Location: [place];  Service: Orthopedics;  Laterality: Left;  PRE-OP BLOCK WITH GENERAL ANESTHESIA  . Shoulder acromioplasty Left 01/18/2016    Procedure: SHOULDER  ACROMIOPLASTY;  Surgeon: Milly Jakob, MD;  Location: [place];  Service: Orthopedics;  Laterality: Left;    There were no vitals filed for this visit.      Subjective Assessment - 05/16/16 1518    Subjective Patient continues to complain of pain in his shoulder following weed eating at work; pain is around a 5-6 on pain scale. Even when he is not working, that pain is still up around the attachement point of deltoid musculature.    Pertinent History HTN, tobacco abuse, hx of cardiac cath    Patient Stated Goals get back to job activites (very active)   Currently in Pain? Yes   Pain Score 5                          [facility] Adult PT Treatment/Exercise - 05/16/16 0001    Shoulder Exercises: Supine   External Rotation PROM;AAROM   External Rotation Limitations x20 AAROM    Flexion PROM;AAROM   Flexion Limitations x20 AAROM   ABduction PROM;AAROM   ABduction Limitations x20 AAROM    Other Supine Exercises serratus punches 2x10 iwth 2#    Shoulder Exercises: Standing   Other Standing Exercises shoulder pendulums x2 minutes each direction   Other Standing Exercises wall shoulder elevation 1x10, 5 second holds                 PT Education - 05/16/16  1608    Education provided No          PT Short Term Goals - 04/25/16 1540    PT SHORT TERM GOAL #1   Title Patient to demonstrate at least 100 degrees of L shoulder flexion and abduction in order to assist in reducing pain and improving general function    Baseline 6/26- flexion 133, abduction 112   Time 3   Period Weeks   Status Achieved   PT SHORT TERM GOAL #2   Title Patient to demonstrate 90 degrees L shoulder IR and at least 45 degrees L shoulder ER in order to assist in reducing pain adn improve function    Baseline 6/26- IR WFL, ER 38 at 40 degrees ABD    Time 3   Period Weeks   Status Partially Met   PT SHORT TERM GOAL #3   Title Patient to demonstrate symmetrical neuromuscular  recrutiment of all scapular and shoulder girlde muscular to demosntrate improved function and neuromuscular recruitment for functional task performance    Time 3   Period Weeks   Status Achieved   PT SHORT TERM GOAL #4   Title Patient to experience pain no more than 4/10 L shoulder during all tasks in order to improve task performance and overall QOL    Baseline 6/26- average is about 5/10   Time 3   Period Weeks   Status On-going   PT SHORT TERM GOAL #5   Title Patient to be independent in correctly and consistently performing appropriate HEP, to be updated PRN    Baseline 6/26-reports good compliance    Time 3   Period Weeks   Status Achieved           PT Long Term Goals - 04/25/16 1543    PT LONG TERM GOAL #1   Title Patient to demonstrate at least 160 degrees L shoulder flexion and ABD in order to improve function and assist in improving overall QOL    Time 6   Period Weeks   Status On-going   PT LONG TERM GOAL #2   Title Patient to demonstrate L shoulder IR of 90 degrees and L shoulder ER of at least 80 degrees in order to improve function and assist in improving overall QOL    Time 6   Period Weeks   Status On-going   PT LONG TERM GOAL #3   Title Patient to demonstrate strength at least 4/5 in all tested muscle groups in order to improve function and reduce pain, improve regional stability    Time 6   Period Weeks   Status On-going   PT LONG TERM GOAL #4   Title Patient to report he has been able to use both UEs to dress and wash his hair in order to demosntrate improved general function and ability to perform self-care tasks    Time 6   Period Weeks   Status On-going   PT LONG TERM GOAL #5   Title Patient to report he has been able to return to light duty at work in order to assist in returning to function and improved QOL    Baseline 6/26- has returned to light duty at work but is still on very light precautions per MD    Time 6   Period Weeks   Status Achieved                Plan - 05/16/16 1609    Clinical Impression Statement Continued work on  ROM via AAROM and PROM techniques today, during which patient continues to severely compensate with thoracic motion despite cues from PT and attempt to block thoracic motion using bolster. Also continued to work on strengthening serratus anterior to reduce winging and assist in restoring regional stability. Introduced finger walks up peg board today as well with patient able to achieve level 12 before having to stop due to pain/shoulder stiffness. Will plan to message MD with update as PT does remain concerned about ongoing reduced ROM in shoulder at this point in recovery.    Rehab Potential Good   PT Frequency 3x / week   PT Duration 4 weeks   PT Treatment/Interventions ADLs/Self Care Home Management;Cryotherapy;Moist Heat;Functional mobility training;Therapeutic activities;Therapeutic exercise;Neuromuscular re-education;Patient/family education;Manual techniques;Passive range of motion;Taping;Electrical Stimulation;Ultrasound   PT Next Visit Plan PROM (block scapula to prevent excessive winging).Sinclair Ship.Marland Kitchenisometrics.Marland Kitchenscapular stabilization. Continue MET with ROM. Increase focus on scapular exercises to reduce winging, introduce serratus punches if tolerated.   Next session begin thumb tacks for IR/ER and progress wall washing.     PT Home Exercise Plan no updates this visit   Consulted and Agree with Plan of Care Patient      Patient will benefit from skilled therapeutic intervention in order to improve the following deficits and impairments:  Hypomobility, Decreased knowledge of precautions, Decreased strength, Increased fascial restricitons, Impaired UE functional use, Pain, Increased muscle spasms, Improper body mechanics, Decreased coordination, Impaired flexibility, Postural dysfunction  Visit Diagnosis: Stiffness of left shoulder, not elsewhere classified  Pain in left shoulder  Muscle weakness  (generalized)  Abnormal posture     Problem List Patient Active Problem List   Diagnosis Date Noted  . Hypokalemia   . Preop cardiovascular exam 02/24/2014  . Apical variant hypertrophic cardiomyopathy ([place]) 02/24/2014  . Tobacco abuse 02/24/2014  . HYPERTENSION, BENIGN 12/12/2008  . CHEST PAIN-UNSPECIFIED 12/12/2008  . ABNORMAL EKG 12/12/2008    Deniece Ree PT, DPT [facility] [address] [place], [place], [address] Phone: [phone]   Fax:  [phone]  Name: [name] Foster MRN: [id number] Date of Birth: 06-Dec-1955

## 2016-05-18 ENCOUNTER — Ambulatory Visit (HOSPITAL_COMMUNITY): Payer: Worker's Compensation

## 2016-05-18 DIAGNOSIS — R293 Abnormal posture: Secondary | ICD-10-CM

## 2016-05-18 DIAGNOSIS — M25612 Stiffness of left shoulder, not elsewhere classified: Secondary | ICD-10-CM | POA: Diagnosis not present

## 2016-05-18 DIAGNOSIS — M6281 Muscle weakness (generalized): Secondary | ICD-10-CM

## 2016-05-18 DIAGNOSIS — M25512 Pain in left shoulder: Secondary | ICD-10-CM

## 2016-05-18 NOTE — Therapy (Signed)
[facility] [address] [place], [place], [address] Phone: [phone]   Fax:  [phone]  Physical Therapy Treatment  Patient Details  Name: [name] Foster MRN: [id number] Date of Birth: 11-22-1955 Referring Provider: Milly Jakob   Encounter Date: 05/18/2016      PT End of Session - 05/18/16 1528    Visit Number 31   Number of Visits 34   Date for PT Re-Evaluation 05/23/16   Authorization Type Worker's Comp (36 have been approved)  04/07/2016- 12 more visits have been authorized; week of 6/12- 12 more visits authorized    Authorization Time Period 8/84/16 to 04/05/29; recert done on 1/60; recert done on 1/09   Authorization - Visit Number 59   Authorization - Number of Visits 36   PT Start Time 1518   PT Stop Time 1600   PT Time Calculation (min) 42 min   Activity Tolerance Patient tolerated treatment well;Patient limited by pain   Behavior During Therapy [facility] for tasks assessed/performed      Past Medical History  Diagnosis Date  . Benign essential hypertension   . Abnormal EKG   . Tobacco abuse   . Apical variant hypertrophic cardiomyopathy ([place])   . Hypokalemia   . BPH (benign prostatic hyperplasia)     Past Surgical History  Procedure Laterality Date  . Cardiac catheterization      2008  . Kidney surgery Right April 2015    benign tumor removal  . Colonoscopy N/A 02/26/2015    Procedure: COLONOSCOPY;  Surgeon: Rogene Houston, MD;  [facility];  Service: Endoscopy;  Laterality: N/A;  830  . Prostate ablation  12-2015 at [facility]  . Shoulder arthroscopy with rotator cuff repair and subacromial decompression Left 01/18/2016    Procedure: LEFT SHOULDER ARTHROSCOPY WITH ROTATOR CUFF REPAIR AND SUBACROMIAL DECOMPRESSION;  Surgeon: Milly Jakob, MD;  Location: [place];  Service: Orthopedics;  Laterality: Left;  PRE-OP BLOCK WITH GENERAL ANESTHESIA  . Shoulder acromioplasty Left 01/18/2016   Procedure: SHOULDER ACROMIOPLASTY;  Surgeon: Milly Jakob, MD;  Location: [place];  Service: Orthopedics;  Laterality: Left;    There were no vitals filed for this visit.      Subjective Assessment - 05/18/16 1523    Subjective Pt stated it has been a rough day, continues to mow and weed eating at work with reports of scar tissue catching with radicular symptoms down UE.  Current pain scale 5/10   Currently in Pain? Yes   Pain Score 5    Pain Location Shoulder   Pain Orientation Left   Pain Descriptors / Indicators Sharp;Aching   Pain Type Surgical pain   Pain Radiating Towards running down humerus to deltoid insertion   Pain Onset More than a month ago   Pain Frequency Constant   Aggravating Factors  positions at night, really has a hard time finding a good position   Pain Relieving Factors movement to some extent but mostly pain pills   Effect of Pain on Daily Activities still cannot do ADLs or full duties at work              [facility] Adult PT Treatment/Exercise - 05/18/16 0001    Shoulder Exercises: Supine   External Rotation PROM;AAROM   External Rotation Limitations x20 AAROM    Flexion PROM;AAROM   Flexion Limitations x20 AAROM   ABduction PROM;AAROM   ABduction Limitations x20 AAROM    Shoulder Exercises: Therapy Diona Foley  Flexion 10 reps   ABduction 10 reps   Right/Left 10 reps   Shoulder Exercises: Stretch   Other Shoulder Stretches door way pec stretch 3x 15"   Manual Therapy   Manual Therapy Joint mobilization;Passive ROM;Soft tissue mobilization;Myofascial release   Manual therapy comments performed separately from all other skilled intervetnions    Joint Mobilization grade III inferior and posterior GH mobs at 90 degrees abduction   Soft tissue mobilization TPR Lt pec   Myofascial Release surgical incision/scar tissue   Scapular Mobilization S<>I, protraction<>retraction   Passive ROM flexion, abduction, ER    Muscle Energy Technique MET  during PROM             PT Short Term Goals - 04/25/16 1540    PT SHORT TERM GOAL #1   Title Patient to demonstrate at least 100 degrees of L shoulder flexion and abduction in order to assist in reducing pain and improving general function    Baseline 6/26- flexion 133, abduction 112   Time 3   Period Weeks   Status Achieved   PT SHORT TERM GOAL #2   Title Patient to demonstrate 90 degrees L shoulder IR and at least 45 degrees L shoulder ER in order to assist in reducing pain adn improve function    Baseline 6/26- IR WFL, ER 38 at 40 degrees ABD    Time 3   Period Weeks   Status Partially Met   PT SHORT TERM GOAL #3   Title Patient to demonstrate symmetrical neuromuscular recrutiment of all scapular and shoulder girlde muscular to demosntrate improved function and neuromuscular recruitment for functional task performance    Time 3   Period Weeks   Status Achieved   PT SHORT TERM GOAL #4   Title Patient to experience pain no more than 4/10 L shoulder during all tasks in order to improve task performance and overall QOL    Baseline 6/26- average is about 5/10   Time 3   Period Weeks   Status On-going   PT SHORT TERM GOAL #5   Title Patient to be independent in correctly and consistently performing appropriate HEP, to be updated PRN    Baseline 6/26-reports good compliance    Time 3   Period Weeks   Status Achieved           PT Long Term Goals - 04/25/16 1543    PT LONG TERM GOAL #1   Title Patient to demonstrate at least 160 degrees L shoulder flexion and ABD in order to improve function and assist in improving overall QOL    Time 6   Period Weeks   Status On-going   PT LONG TERM GOAL #2   Title Patient to demonstrate L shoulder IR of 90 degrees and L shoulder ER of at least 80 degrees in order to improve function and assist in improving overall QOL    Time 6   Period Weeks   Status On-going   PT LONG TERM GOAL #3   Title Patient to demonstrate strength at least  4/5 in all tested muscle groups in order to improve function and reduce pain, improve regional stability    Time 6   Period Weeks   Status On-going   PT LONG TERM GOAL #4   Title Patient to report he has been able to use both UEs to dress and wash his hair in order to demosntrate improved general function and ability to perform self-care tasks    Time 6  Period Weeks   Status On-going   PT LONG TERM GOAL #5   Title Patient to report he has been able to return to light duty at work in order to assist in returning to function and improved QOL    Baseline 6/26- has returned to light duty at work but is still on very light precautions per MD    Time 6   Period Weeks   Status Achieved               Plan - 05/18/16 1613    Clinical Impression Statement Continued session foucs on improving ROM with PROM and AAROM technqiues today.  Noted tight pectoralis musculature, pt educated on technqiues to reduce muscle guarding and instructed proper gait mechanics with opposite UE and LE, min cueing to reduce IR with gait.  Manual soft tissue mobilization with scapular and pectoralis region as well scar tissue massage to reduce adhesions and PROM for flexion, abduction and ER per pt tolerance.  Pt continues to exhibit hard end feels all directions and reports increased pain following moving grass with work based activities.   Rehab Potential Good   PT Frequency 3x / week   PT Duration 4 weeks   PT Treatment/Interventions ADLs/Self Care Home Management;Cryotherapy;Moist Heat;Functional mobility training;Therapeutic activities;Therapeutic exercise;Neuromuscular re-education;Patient/family education;Manual techniques;Passive range of motion;Taping;Electrical Stimulation;Ultrasound   PT Next Visit Plan PROM (block scapula to prevent excessive winging).Sinclair Ship.Marland Kitchenisometrics.Marland Kitchenscapular stabilization. Continue MET with ROM. Increase focus on scapular exercises to reduce winging, introduce serratus punches if  tolerated.   Next session begin thumb tacks for IR/ER and progress wall washing.        Patient will benefit from skilled therapeutic intervention in order to improve the following deficits and impairments:  Hypomobility, Decreased knowledge of precautions, Decreased strength, Increased fascial restricitons, Impaired UE functional use, Pain, Increased muscle spasms, Improper body mechanics, Decreased coordination, Impaired flexibility, Postural dysfunction  Visit Diagnosis: Stiffness of left shoulder, not elsewhere classified  Pain in left shoulder  Muscle weakness (generalized)  Abnormal posture     Problem List Patient Active Problem List   Diagnosis Date Noted  . Hypokalemia   . Preop cardiovascular exam 02/24/2014  . Apical variant hypertrophic cardiomyopathy ([place]) 02/24/2014  . Tobacco abuse 02/24/2014  . HYPERTENSION, BENIGN 12/12/2008  . CHEST PAIN-UNSPECIFIED 12/12/2008  . ABNORMAL EKG 12/12/2008   Ihor Austin, LPTA; [place]  Aldona Lento 05/18/2016, 4:28 PM  [facility] [address] [place], [place], [address] Phone: [phone]   Fax:  [phone]  Name: Robt Okuda MRN: [id number] Date of Birth: 22-May-1956

## 2016-05-19 ENCOUNTER — Ambulatory Visit (HOSPITAL_COMMUNITY): Payer: Worker's Compensation

## 2016-05-20 ENCOUNTER — Ambulatory Visit (HOSPITAL_COMMUNITY): Payer: Worker's Compensation | Admitting: Physical Therapy

## 2016-05-20 DIAGNOSIS — M6281 Muscle weakness (generalized): Secondary | ICD-10-CM

## 2016-05-20 DIAGNOSIS — R293 Abnormal posture: Secondary | ICD-10-CM

## 2016-05-20 DIAGNOSIS — M25612 Stiffness of left shoulder, not elsewhere classified: Secondary | ICD-10-CM

## 2016-05-20 DIAGNOSIS — M25512 Pain in left shoulder: Secondary | ICD-10-CM

## 2016-05-20 NOTE — Therapy (Signed)
[place], [place], [address] Phone: [phone]   Fax:  [phone]  Physical Therapy Treatment  Patient Details  Name: [name] Foster MRN: [id number] Date of Birth: [birth year] Referring Provider: Milly Jakob   Encounter Date: 05/20/2016      PT End of Session - 05/20/16 1621    Visit Number 32   Number of Visits 34   Date for PT Re-Evaluation 05/23/16   Authorization Type Worker's Comp (36 have been approved)  04/07/2016- 12 more visits have been authorized; week of 6/12- 12 more visits authorized    Authorization Time Period 5/88/50 to 2/77/41; recert done on 2/87; recert done on 8/67   Authorization - Visit Number 52   Authorization - Number of Visits 36   PT Start Time 1530   PT Stop Time 1609   PT Time Calculation (min) 39 min   Activity Tolerance Patient limited by pain   Behavior During Therapy [facility] for tasks assessed/performed      Past Medical History  Diagnosis Date  . Benign essential hypertension   . Abnormal EKG   . Tobacco abuse   . Apical variant hypertrophic cardiomyopathy ([place])   . Hypokalemia   . BPH (benign prostatic hyperplasia)     Past Surgical History  Procedure Laterality Date  . Cardiac catheterization      2008  . Kidney surgery Right April 2015    benign tumor removal  . Colonoscopy N/A 02/26/2015    Procedure: COLONOSCOPY;  Surgeon: Rogene Houston, MD;  [facility];  Service: Endoscopy;  Laterality: N/A;  830  . Prostate ablation  12-2015 at [facility]  . Shoulder arthroscopy with rotator cuff repair and subacromial decompression Left 01/18/2016    Procedure: LEFT SHOULDER ARTHROSCOPY WITH ROTATOR CUFF REPAIR AND SUBACROMIAL DECOMPRESSION;  Surgeon: Milly Jakob, MD;  Location: [place];  Service: Orthopedics;  Laterality: Left;  PRE-OP BLOCK WITH GENERAL ANESTHESIA  . Shoulder acromioplasty Left 01/18/2016    Procedure: SHOULDER ACROMIOPLASTY;   Surgeon: Milly Jakob, MD;  Location: [place];  Service: Orthopedics;  Laterality: Left;    There were no vitals filed for this visit.      Subjective Assessment - 05/20/16 1536    Subjective Pt states that he continues to have pain in both shoulders. He is still weed eating at work trying to use mostly his RUE and guiding with his LLE.    Pertinent History HTN, tobacco abuse, hx of cardiac cath    Patient Stated Goals get back to job activites (very active)   Currently in Pain? Yes   Pain Score 5    Pain Onset More than a month ago                         [facility] Adult PT Treatment/Exercise - 05/20/16 0001    Shoulder Exercises: Standing   Other Standing Exercises grip, twist and lift with 2# bar 10x10 sec holds   Other Standing Exercises closed chain UE support on table rocking L<>R, A<>P x20 reps each; LUE ball press into table with L/R, A/P and circular motions for improved muscle activation and control.    Manual Therapy   Manual Therapy Joint mobilization;Passive ROM   Manual therapy comments performed separately from all other skilled intervetnions    Joint Mobilization Grade IV Lt GH jt mobs inferior at end range abd and 90 deg flexion, posterior with  shoulder at 90deg abd    Passive ROM flexion and abduction x5 reps each                 PT Education - 05/20/16 1620    Education provided Yes   Education Details encouraged HEP adherence   Person(s) Educated Patient   Methods Explanation   Comprehension Verbalized understanding          PT Short Term Goals - 04/25/16 1540    PT SHORT TERM GOAL #1   Title Patient to demonstrate at least 100 degrees of L shoulder flexion and abduction in order to assist in reducing pain and improving general function    Baseline 6/26- flexion 133, abduction 112   Time 3   Period Weeks   Status Achieved   PT SHORT TERM GOAL #2   Title Patient to demonstrate 90 degrees L shoulder IR and at  least 45 degrees L shoulder ER in order to assist in reducing pain adn improve function    Baseline 6/26- IR WFL, ER 38 at 40 degrees ABD    Time 3   Period Weeks   Status Partially Met   PT SHORT TERM GOAL #3   Title Patient to demonstrate symmetrical neuromuscular recrutiment of all scapular and shoulder girlde muscular to demosntrate improved function and neuromuscular recruitment for functional task performance    Time 3   Period Weeks   Status Achieved   PT SHORT TERM GOAL #4   Title Patient to experience pain no more than 4/10 L shoulder during all tasks in order to improve task performance and overall QOL    Baseline 6/26- average is about 5/10   Time 3   Period Weeks   Status On-going   PT SHORT TERM GOAL #5   Title Patient to be independent in correctly and consistently performing appropriate HEP, to be updated PRN    Baseline 6/26-reports good compliance    Time 3   Period Weeks   Status Achieved           PT Long Term Goals - 04/25/16 1543    PT LONG TERM GOAL #1   Title Patient to demonstrate at least 160 degrees L shoulder flexion and ABD in order to improve function and assist in improving overall QOL    Time 6   Period Weeks   Status On-going   PT LONG TERM GOAL #2   Title Patient to demonstrate L shoulder IR of 90 degrees and L shoulder ER of at least 80 degrees in order to improve function and assist in improving overall QOL    Time 6   Period Weeks   Status On-going   PT LONG TERM GOAL #3   Title Patient to demonstrate strength at least 4/5 in all tested muscle groups in order to improve function and reduce pain, improve regional stability    Time 6   Period Weeks   Status On-going   PT LONG TERM GOAL #4   Title Patient to report he has been able to use both UEs to dress and wash his hair in order to demosntrate improved general function and ability to perform self-care tasks    Time 6   Period Weeks   Status On-going   PT LONG TERM GOAL #5   Title  Patient to report he has been able to return to light duty at work in order to assist in returning to function and improved QOL    Baseline 6/26-  has returned to light duty at work but is still on very light precautions per MD    Time 6   Period Weeks   Status Achieved               Plan - 05/20/16 1622    Clinical Impression Statement Today's session focused on manual techniques to improve joint restrictions as well as therex to address shoulder girdle activation and proprioception. Pt continues to present with limited ROM and strength limiting his return to activity.   Rehab Potential Good   PT Frequency 3x / week   PT Duration 4 weeks   PT Treatment/Interventions ADLs/Self Care Home Management;Cryotherapy;Moist Heat;Functional mobility training;Therapeutic activities;Therapeutic exercise;Neuromuscular re-education;Patient/family education;Manual techniques;Passive range of motion;Taping;Electrical Stimulation;Ultrasound   PT Next Visit Plan need to begin AROM; progress scapular stabilization (shoulder ER/IR in closed chain positions)   PT Home Exercise Plan no updates this visit   Consulted and Agree with Plan of Care Patient      Patient will benefit from skilled therapeutic intervention in order to improve the following deficits and impairments:  Hypomobility, Decreased knowledge of precautions, Decreased strength, Increased fascial restricitons, Impaired UE functional use, Pain, Increased muscle spasms, Improper body mechanics, Decreased coordination, Impaired flexibility, Postural dysfunction  Visit Diagnosis: Stiffness of left shoulder, not elsewhere classified  Pain in left shoulder  Muscle weakness (generalized)  Abnormal posture     Problem List Patient Active Problem List   Diagnosis Date Noted  . Hypokalemia   . Preop cardiovascular exam 02/24/2014  . Apical variant hypertrophic cardiomyopathy ([place]) 02/24/2014  . Tobacco abuse 02/24/2014  . HYPERTENSION,  BENIGN 12/12/2008  . CHEST PAIN-UNSPECIFIED 12/12/2008  . ABNORMAL EKG 12/12/2008   4:31 PM,05/20/2016 Elly Modena PT, DPT [facility] [address] [place], [place], [address] Phone: [phone]   Fax:  [phone]  Name: [name] Foster MRN: [id number] Date of Birth: September 14, 1956

## 2016-05-23 ENCOUNTER — Ambulatory Visit (HOSPITAL_COMMUNITY): Payer: Worker's Compensation | Admitting: Physical Therapy

## 2016-05-23 DIAGNOSIS — M25612 Stiffness of left shoulder, not elsewhere classified: Secondary | ICD-10-CM | POA: Diagnosis not present

## 2016-05-23 DIAGNOSIS — M6281 Muscle weakness (generalized): Secondary | ICD-10-CM

## 2016-05-23 DIAGNOSIS — M25512 Pain in left shoulder: Secondary | ICD-10-CM

## 2016-05-23 DIAGNOSIS — R293 Abnormal posture: Secondary | ICD-10-CM

## 2016-05-23 NOTE — Therapy (Signed)
[place] [address] [place], [place], [address] Phone: [phone]   Fax:  [phone]  Physical Therapy Treatment (Re-Assessment)  Patient Details  Name: [name] Foster MRN: [id number] Date of Birth: 1956-04-09 Referring Provider: Milly Jakob   Encounter Date: 05/23/2016      PT End of Session - 05/23/16 1549    Visit Number 33   Number of Visits 39   Date for PT Re-Evaluation 06/20/16   Authorization Type Worker's Comp (36 have been approved)  04/07/2016- 12 more visits have been authorized; week of 6/12- 12 more visits authorized; 7/24- waiting to see if workers comp will approve past 58    Authorization Time Period 7/67/34 to 1/93/79; recert done on 0/24; recert done on 0/97; recert done on 3/53    Authorization - Visit Number 61   Authorization - Number of Visits 36   PT Start Time 1516   PT Stop Time 1600   PT Time Calculation (min) 44 min   Activity Tolerance Patient limited by pain   Behavior During Therapy [facility] for tasks assessed/performed      Past Medical History:  Diagnosis Date  . Abnormal EKG   . Apical variant hypertrophic cardiomyopathy ([place])   . Benign essential hypertension   . BPH (benign prostatic hyperplasia)   . Hypokalemia   . Tobacco abuse     Past Surgical History:  Procedure Laterality Date  . CARDIAC CATHETERIZATION     2008  . COLONOSCOPY N/A 02/26/2015   Procedure: COLONOSCOPY;  Surgeon: Rogene Houston, MD;  [facility];  Service: Endoscopy;  Laterality: N/A;  830  . KIDNEY SURGERY Right April 2015   benign tumor removal  . PROSTATE ABLATION  12-2015 at [facility]  . SHOULDER ACROMIOPLASTY Left 01/18/2016   Procedure: SHOULDER ACROMIOPLASTY;  Surgeon: Milly Jakob, MD;  Location: [place];  Service: Orthopedics;  Laterality: Left;  . SHOULDER ARTHROSCOPY WITH ROTATOR CUFF REPAIR AND SUBACROMIAL DECOMPRESSION Left 01/18/2016   Procedure: LEFT SHOULDER ARTHROSCOPY WITH  ROTATOR CUFF REPAIR AND SUBACROMIAL DECOMPRESSION;  Surgeon: Milly Jakob, MD;  Location: [place];  Service: Orthopedics;  Laterality: Left;  PRE-OP BLOCK WITH GENERAL ANESTHESIA    There were no vitals filed for this visit.      Subjective Assessment - 05/23/16 1517    Subjective Patient arrives today stating that he is still having quite a bit of pain sleeping and he has felt very sore since last session; he states that he really is not seeing a lot of improvement with skilled PT services, he still has quite a bit of trouble moving his UE through ROM, cannot supinate/pronate, has trouble operating controls on his car. His R shoulder is still very much bothering him. He rates himself as a 15/100 on a subjective scale.    Pertinent History HTN, tobacco abuse, hx of cardiac cath    Patient Stated Goals get back to job activites (very active)   Currently in Pain? Yes   Pain Score 6    Pain Location Shoulder   Pain Orientation Right;Left   Pain Descriptors / Indicators Sharp;Aching   Pain Type Surgical pain   Pain Radiating Towards still running down humerus to deltoid insertion    Pain Onset More than a month ago   Pain Frequency Constant   Aggravating Factors  working through the day, sleeping at night    Pain Relieving Factors movement and pain pills    Effect of  Pain on Daily Activities cannot do ADLs efficiently or do full duties at work             [facility] PT Assessment - 05/23/16 0001      Assessment   Medical Diagnosis L rotator cuff repair    Referring Provider Milly Jakob    Onset Date/Surgical Date --  March 2017   Next MD Visit Dr. Grandville Silos 8/7     Restrictions   Weight Bearing Restrictions No     Balance Screen   Has the patient fallen in the past 6 months Yes   How many times? 1   Has the patient had a decrease in activity level because of a fear of falling?  No   Is the patient reluctant to leave their home because of a fear of falling?   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, dig, perform manual labor    Leisure no hobbies      Observation/Other Assessments   Focus on Therapeutic Outcomes (FOTO)  69% limited (was 59% limited)     Posture/Postural Control   Posture/Postural Control Postural limitations   Postural Limitations Forward head;Rounded Shoulders   Posture Comments pain based compensations     AROM   Left Shoulder Flexion 120 Degrees  thoracic compensation    Left Shoulder ABduction 118 Degrees   Left Shoulder External Rotation 40 Degrees  at 70 degrees ABD      Strength   Left Shoulder Flexion 2/5   Left Shoulder ABduction 2/5   Left Shoulder Internal Rotation 4-/5   Left Shoulder External Rotation 2/5                     [facility] Adult PT Treatment/Exercise - 05/23/16 0001      Shoulder Exercises: Supine   External Rotation AAROM;20 reps   Flexion AAROM;20 reps   ABduction AAROM;20 reps     Shoulder Exercises: Standing   Other Standing Exercises standing pendulums 2 minutes each way                PT Education - 05/23/16 1548    Education provided Yes   Education Details current status with skilled PT services, POC moving forward per MD specific  request    Person(s) Educated Patient   Methods Explanation   Comprehension Verbalized understanding          PT Short Term Goals - 05/23/16 1535      PT SHORT TERM GOAL #1   Title Patient to demonstrate at least 100 degrees of L shoulder flexion and abduction in order to assist in reducing pain and improving general function    Baseline 7/24- 120 degrees flexion, ABD 118   Time 3   Period Weeks   Status Achieved     PT SHORT TERM GOAL #2   Title Patient to demonstrate 90 degrees L shoulder IR and at least 45 degrees L  shoulder ER in order to assist in reducing pain adn improve function    Baseline 7/24- IR WFL, ER 40 at 70 degrees ABD    Time 3   Period Weeks   Status Partially Met     PT SHORT TERM GOAL #3   Title Patient to demonstrate symmetrical neuromuscular recrutiment of all scapular and shoulder girlde muscular to  demosntrate improved function and neuromuscular recruitment for functional task performance    Time 3   Period Weeks   Status Partially Met     PT SHORT TERM GOAL #4   Title Patient to experience pain no more than 4/10 L shoulder during all tasks in order to improve task performance and overall QOL    Baseline 7/24- average is around 5-6/10   Time 3   Period Weeks   Status On-going     PT SHORT TERM GOAL #5   Title Patient to be independent in correctly and consistently performing appropriate HEP, to be updated PRN    Baseline 7/24- continues to report good compliance to HEP    Time 3   Period Weeks   Status Achieved           PT Long Term Goals - 05/23/16 1538      PT LONG TERM GOAL #1   Title Patient to demonstrate at least 160 degrees L shoulder flexion and ABD in order to improve function and assist in improving overall QOL    Time 6   Period Weeks   Status On-going     PT LONG TERM GOAL #2   Title Patient to demonstrate L shoulder IR of 90 degrees and L shoulder ER of at least 80 degrees in order to improve function and assist in improving overall QOL    Time 6   Period Weeks   Status On-going     PT LONG TERM GOAL #3   Title Patient to demonstrate strength at least 4/5 in all tested muscle groups in order to improve function and reduce pain, improve regional stability    Baseline 7/24- flexion/abduction approximately 2/5, ER approximately 2/5, IR approxiamtey 4-/5    Time 6   Period Weeks   Status On-going     PT LONG TERM GOAL #4   Title Patient to report he has been able to use both UEs to dress and wash his hair in order to demosntrate improved  general function and ability to perform self-care tasks    Baseline 7/24- reports that this is still very difficult    Time 6   Period Weeks   Status On-going     PT LONG TERM GOAL #5   Title Patient to report he has been able to return to light duty at work in order to assist in returning to function and improved QOL    Baseline 7/24- has returned to light duty but still on MD precautions    Time 6   Period Weeks   Status Achieved               Plan - 05/23/16 1550    Clinical Impression Statement Re-assessment performed today. Patient arrives stating that he feels like he is a 15/100 on a subjective scale and is frustrated regarding his very slow progress and lack of motion/function despite participation in skilled PT services. Upon examination, patient continues to reveal severe joint and soft tissue restrictions that limit even his PROM; however also note that patient does continue to be pain limited, displaying significant thoracic compensations with PROM which he reports are due to pain from activity. PT has continued working with [employer], STM, postural training, and functional stretching however has thus far been unable to move past current ROM limitations which can be seen earlier in this note. Patient's surgeon has requested that patient continue another 5-6 sessions until his appointment on 06/06/16 during which MD will  re-assess patient. At this time will abide by MD request as long as worker's comp allows extra sessions; asked front desk staff to request these today.    Rehab Potential Good   PT Frequency Other (comment)  5-6 more sessions per MD request    PT Duration Other (comment)  5-6 more sessions per MD request    PT Treatment/Interventions ADLs/Self Care Home Management;Cryotherapy;Moist Heat;Functional mobility training;Therapeutic activities;Therapeutic exercise;Neuromuscular re-education;Patient/family education;Manual techniques;Passive range of  motion;Taping;Electrical Stimulation;Ultrasound   PT Next Visit Plan need to start AROM when able, continue ROM exercises ; progress scapular stabilization (shoulder ER/IR in closed chain positions)   PT Home Exercise Plan no updates this visit   Consulted and Agree with Plan of Care Patient      Patient will benefit from skilled therapeutic intervention in order to improve the following deficits and impairments:  Hypomobility, Decreased knowledge of precautions, Decreased strength, Increased fascial restricitons, Impaired UE functional use, Pain, Increased muscle spasms, Improper body mechanics, Decreased coordination, Impaired flexibility, Postural dysfunction  Visit Diagnosis: Stiffness of left shoulder, not elsewhere classified - Plan: PT plan of care cert/re-cert  Pain in left shoulder - Plan: PT plan of care cert/re-cert  Muscle weakness (generalized) - Plan: PT plan of care cert/re-cert  Abnormal posture - Plan: PT plan of care cert/re-cert     Problem List Patient Active Problem List   Diagnosis Date Noted  . Hypokalemia   . Preop cardiovascular exam 02/24/2014  . Apical variant hypertrophic cardiomyopathy ([place]) 02/24/2014  . Tobacco abuse 02/24/2014  . HYPERTENSION, BENIGN 12/12/2008  . CHEST PAIN-UNSPECIFIED 12/12/2008  . ABNORMAL EKG 12/12/2008    Deniece Ree PT, DPT [facility] [address] [place], [place], [address] Phone: [phone]   Fax:  [phone]  Name: [name] Foster MRN: [id number] Date of Birth: 1955/12/01

## 2016-05-25 ENCOUNTER — Ambulatory Visit (HOSPITAL_COMMUNITY): Payer: Worker's Compensation | Admitting: Physical Therapy

## 2016-05-25 DIAGNOSIS — R293 Abnormal posture: Secondary | ICD-10-CM

## 2016-05-25 DIAGNOSIS — M25512 Pain in left shoulder: Secondary | ICD-10-CM

## 2016-05-25 DIAGNOSIS — M25612 Stiffness of left shoulder, not elsewhere classified: Secondary | ICD-10-CM

## 2016-05-25 DIAGNOSIS — M6281 Muscle weakness (generalized): Secondary | ICD-10-CM

## 2016-05-25 NOTE — Therapy (Signed)
[place], [place], [address] Phone: [phone]   Fax:  [phone]  Physical Therapy Treatment  Patient Details  Name: [name] Foster MRN: [id number] Date of Birth: 03-04-1956 Referring Provider: Milly Jakob   Encounter Date: 05/25/2016      PT End of Session - 05/25/16 1607    Visit Number 34   Number of Visits 39   Date for PT Re-Evaluation 06/20/16   Authorization Type Worker's Comp (36 have been approved)  04/07/2016- 12 more visits have been authorized; week of 6/12- 12 more visits authorized; 7/24- waiting to see if workers comp will approve past 36    Authorization Time Period 5/37/94 to 01/25/60; recert done on 4/70; recert done on 9/29; recert done on 5/74    Authorization - Visit Number 8   Authorization - Number of Visits 36   PT Start Time 1519   PT Stop Time 1558   PT Time Calculation (min) 39 min   Activity Tolerance Patient limited by pain   Behavior During Therapy [facility] for tasks assessed/performed      Past Medical History:  Diagnosis Date  . Abnormal EKG   . Apical variant hypertrophic cardiomyopathy ([place])   . Benign essential hypertension   . BPH (benign prostatic hyperplasia)   . Hypokalemia   . Tobacco abuse     Past Surgical History:  Procedure Laterality Date  . CARDIAC CATHETERIZATION     2008  . COLONOSCOPY N/A 02/26/2015   Procedure: COLONOSCOPY;  Surgeon: Rogene Houston, MD;  [facility];  Service: Endoscopy;  Laterality: N/A;  830  . KIDNEY SURGERY Right April 2015   benign tumor removal  . [facility]  [facility]  . SHOULDER ACROMIOPLASTY Left 01/18/2016   Procedure: SHOULDER ACROMIOPLASTY;  Surgeon: Milly Jakob, MD;  Location: [place];  Service: Orthopedics;  Laterality: Left;  . SHOULDER ARTHROSCOPY WITH ROTATOR CUFF REPAIR AND SUBACROMIAL DECOMPRESSION Left 01/18/2016   Procedure: LEFT SHOULDER ARTHROSCOPY WITH ROTATOR CUFF  REPAIR AND SUBACROMIAL DECOMPRESSION;  Surgeon: Milly Jakob, MD;  Location: [place];  Service: Orthopedics;  Laterality: Left;  PRE-OP BLOCK WITH GENERAL ANESTHESIA    There were no vitals filed for this visit.      Subjective Assessment - 05/25/16 1519    Subjective Patient arrives today stating that he is still hurting, states that his pain has been so bad that he was not sure if he could make it to work or not today. He had to take multiple pain pills this morning. Nothing else major is going on right now, he just remains frustrated by his limited progress and ongoing high levels of pain.    Pertinent History HTN, tobacco abuse, hx of cardiac cath    Patient Stated Goals get back to job activites (very active)   Currently in Pain? Yes   Pain Score 5    Pain Location Shoulder   Pain Orientation Right;Left                         [facility] Adult PT Treatment/Exercise - 05/25/16 0001      Shoulder Exercises: Supine   External Rotation PROM;AAROM   External Rotation Limitations x20 AAROM, 5 second holds    Flexion PROM;AAROM   Flexion Limitations AAROM x20, 5 second holds    ABduction PROM;AAROM   ABduction Limitations AAROM 5 second holds,  Shoulder Exercises: Standing   Other Standing Exercises standing pendulums with 2# for shoulder distraction throughout prior to [facility]    Other Standing Exercises fingerboard AAROM 1x10 with 5 second holds      Manual Therapy   Manual Therapy Joint mobilization;Passive ROM   Manual therapy comments performed separately from all other skilled intervetnions    Joint Mobilization grade 3 inferior mobilizations 5x30 seconds    Passive ROM flexion/ABD/ER                 PT Education - 05/25/16 1606    Education provided Yes   Education Details still working on Systems developer from workers comp for sessions to cover patient out to next MD appt    Person(s) Educated Patient   Methods  Explanation   Comprehension Verbalized understanding          PT Short Term Goals - 05/23/16 1535      PT SHORT TERM GOAL #1   Title Patient to demonstrate at least 100 degrees of L shoulder flexion and abduction in order to assist in reducing pain and improving general function    Baseline 7/24- 120 degrees flexion, ABD 118   Time 3   Period Weeks   Status Achieved     PT SHORT TERM GOAL #2   Title Patient to demonstrate 90 degrees L shoulder IR and at least 45 degrees L shoulder ER in order to assist in reducing pain adn improve function    Baseline 7/24- IR WFL, ER 40 at 70 degrees ABD    Time 3   Period Weeks   Status Partially Met     PT SHORT TERM GOAL #3   Title Patient to demonstrate symmetrical neuromuscular recrutiment of all scapular and shoulder girlde muscular to demosntrate improved function and neuromuscular recruitment for functional task performance    Time 3   Period Weeks   Status Partially Met     PT SHORT TERM GOAL #4   Title Patient to experience pain no more than 4/10 L shoulder during all tasks in order to improve task performance and overall QOL    Baseline 7/24- average is around 5-6/10   Time 3   Period Weeks   Status On-going     PT SHORT TERM GOAL #5   Title Patient to be independent in correctly and consistently performing appropriate HEP, to be updated PRN    Baseline 7/24- continues to report good compliance to HEP    Time 3   Period Weeks   Status Achieved           PT Long Term Goals - 05/23/16 1538      PT LONG TERM GOAL #1   Title Patient to demonstrate at least 160 degrees L shoulder flexion and ABD in order to improve function and assist in improving overall QOL    Time 6   Period Weeks   Status On-going     PT LONG TERM GOAL #2   Title Patient to demonstrate L shoulder IR of 90 degrees and L shoulder ER of at least 80 degrees in order to improve function and assist in improving overall QOL    Time 6   Period Weeks    Status On-going     PT LONG TERM GOAL #3   Title Patient to demonstrate strength at least 4/5 in all tested muscle groups in order to improve function and reduce pain, improve regional stability    Baseline 7/24- flexion/abduction approximately  2/5, ER approximately 2/5, IR approxiamtey 4-/5    Time 6   Period Weeks   Status On-going     PT LONG TERM GOAL #4   Title Patient to report he has been able to use both UEs to dress and wash his hair in order to demosntrate improved general function and ability to perform self-care tasks    Baseline 7/24- reports that this is still very difficult    Time 6   Period Weeks   Status On-going     PT LONG TERM GOAL #5   Title Patient to report he has been able to return to light duty at work in order to assist in returning to function and improved QOL    Baseline 7/24- has returned to light duty but still on MD precautions    Time 6   Period Weeks   Status Achieved               Plan - 05/25/16 1608    Clinical Impression Statement Patient continues to report significant limitation at home and work due to pain, had to take multiple pain pills this morning. Began session with weighted pendulums to promote shoulder distraction and attempt to reduce pain with motion. Otherwise continued with AAROM and PROM for shoulder ROM- have been unable to effectively progress to AROM as patient has not been able to achieve full PROM despite extensive care from skilled PT services. Currently still waiting for worker's comp approval for more visits to cover patient out to next MD visit, per MD's request for PT extension. On top of pain limitations to activity, patient is now beginning to display significant pain apprehension response, tensing up and guarding before limb reaches available end ROM during PROM especially. Will continue to closely monitor patient and will look forward to guidance from MD at upcoming visit, while considering potential upcoming DC due  to apparent lack of progress.    Rehab Potential Good   PT Frequency Other (comment)   PT Duration Other (comment)   PT Treatment/Interventions ADLs/Self Care Home Management;Cryotherapy;Moist Heat;Functional mobility training;Therapeutic activities;Therapeutic exercise;Neuromuscular re-education;Patient/family education;Manual techniques;Passive range of motion;Taping;Electrical Stimulation;Ultrasound   PT Next Visit Plan need to start AROM when able, continue ROM exercises ; progress scapular stabilization (shoulder ER/IR in closed chain positions). Consider possible upcoming DC.    PT Home Exercise Plan no updates this visit   Consulted and Agree with Plan of Care Patient      Patient will benefit from skilled therapeutic intervention in order to improve the following deficits and impairments:  Hypomobility, Decreased knowledge of precautions, Decreased strength, Increased fascial restricitons, Impaired UE functional use, Pain, Increased muscle spasms, Improper body mechanics, Decreased coordination, Impaired flexibility, Postural dysfunction  Visit Diagnosis: Stiffness of left shoulder, not elsewhere classified  Pain in left shoulder  Abnormal posture  Muscle weakness (generalized)     Problem List Patient Active Problem List   Diagnosis Date Noted  . Hypokalemia   . Preop cardiovascular exam 02/24/2014  . Apical variant hypertrophic cardiomyopathy ([place]) 02/24/2014  . Tobacco abuse 02/24/2014  . HYPERTENSION, BENIGN 12/12/2008  . CHEST PAIN-UNSPECIFIED 12/12/2008  . ABNORMAL EKG 12/12/2008    Deniece Ree PT, DPT [facility] [address] [place], [place], [address] Phone: [phone]   Fax:  [phone]  Name: [name] Foster MRN: [id number] Date of Birth: 1956-02-03

## 2016-05-26 ENCOUNTER — Telehealth (HOSPITAL_COMMUNITY): Payer: Self-pay | Admitting: Physical Therapy

## 2016-05-26 ENCOUNTER — Other Ambulatory Visit: Payer: Self-pay | Admitting: *Deleted

## 2016-05-26 ENCOUNTER — Ambulatory Visit (HOSPITAL_COMMUNITY): Payer: Worker's Compensation | Admitting: Physical Therapy

## 2016-05-26 DIAGNOSIS — S46912A Strain of unspecified muscle, fascia and tendon at shoulder and upper arm level, left arm, initial encounter: Secondary | ICD-10-CM

## 2016-05-26 NOTE — Telephone Encounter (Signed)
Last seen 12/2015 by Alyse Low. PCP Sabra Heck

## 2016-05-26 NOTE — Telephone Encounter (Signed)
Patient a no-show for today's session. Attempted to call however patient did not answer and mailbox full. Today was last scheduled session- will have front desk staff attempt to contact patient to set up another 2 visits before MD appt on 06/06/16.   Deniece Ree PT, DPT [phone]

## 2016-05-27 ENCOUNTER — Ambulatory Visit (HOSPITAL_COMMUNITY): Payer: Worker's Compensation

## 2016-05-27 DIAGNOSIS — M25612 Stiffness of left shoulder, not elsewhere classified: Secondary | ICD-10-CM | POA: Diagnosis not present

## 2016-05-27 DIAGNOSIS — M25512 Pain in left shoulder: Secondary | ICD-10-CM

## 2016-05-27 DIAGNOSIS — M6281 Muscle weakness (generalized): Secondary | ICD-10-CM

## 2016-05-27 DIAGNOSIS — R293 Abnormal posture: Secondary | ICD-10-CM

## 2016-05-27 NOTE — Therapy (Signed)
[place], [place], [address] Phone: [phone]   Fax:  [phone]  Physical Therapy Treatment  Patient Details  Name: [name] Foster MRN: [id number] Date of Birth: 03/14/1956 Referring Provider: Milly Jakob   Encounter Date: 05/27/2016      PT End of Session - 05/27/16 1534    Visit Number 35   Number of Visits 39   Date for PT Re-Evaluation 06/20/16   Authorization Type Worker's Comp (36 have been approved)  04/07/2016- 12 more visits have been authorized; week of 6/12- 12 more visits authorized; 7/24- waiting to see if workers comp will approve past 36    Authorization Time Period 2/40/97 to 3/53/29; recert done on 9/24; recert done on 2/68; recert done on 3/41    Authorization - Visit Number 67   Authorization - Number of Visits 36   PT Start Time 1518   PT Stop Time 1600   PT Time Calculation (min) 42 min   Activity Tolerance Patient limited by pain   Behavior During Therapy [facility] for tasks assessed/performed      Past Medical History:  Diagnosis Date  . Abnormal EKG   . Apical variant hypertrophic cardiomyopathy ([place])   . Benign essential hypertension   . BPH (benign prostatic hyperplasia)   . Hypokalemia   . Tobacco abuse     Past Surgical History:  Procedure Laterality Date  . CARDIAC CATHETERIZATION     2008  . COLONOSCOPY N/A 02/26/2015   Procedure: COLONOSCOPY;  Surgeon: Rogene Houston, MD;  [facility];  Service: Endoscopy;  Laterality: N/A;  830  . KIDNEY SURGERY Right April 2015   benign tumor removal  . PROSTATE ABLATION  12-2015 at [facility]  . SHOULDER ACROMIOPLASTY Left 01/18/2016   Procedure: SHOULDER ACROMIOPLASTY;  Surgeon: Milly Jakob, MD;  Location: [facility];  Service: Orthopedics;  Laterality: Left;  . SHOULDER ARTHROSCOPY WITH ROTATOR CUFF REPAIR AND SUBACROMIAL DECOMPRESSION Left 01/18/2016   Procedure: LEFT SHOULDER ARTHROSCOPY WITH ROTATOR CUFF  REPAIR AND SUBACROMIAL DECOMPRESSION;  Surgeon: Milly Jakob, MD;  Location: [place];  Service: Orthopedics;  Laterality: Left;  PRE-OP BLOCK WITH GENERAL ANESTHESIA    There were no vitals filed for this visit.      Subjective Assessment - 05/27/16 1512    Subjective Pt stated he continues to have pain Bil shoulders Lt> Rt pain scale 5-6/10 today.  reports he did light weedeating wiht work today and makes UE bounce around increasing pain.  Reports he had to increase pain medication lately due to high pain scale.  Pt stated muscles seem to catch over incision wiht movements increasing pain.  increased pain with sleeping lately as well.   Pertinent History HTN, tobacco abuse, hx of cardiac cath    Patient Stated Goals get back to job activites (very active)   Currently in Pain? Yes   Pain Score 6    Pain Location Shoulder   Pain Orientation Right;Left   Pain Descriptors / Indicators Sharp;Aching   Pain Type Surgical pain   Pain Onset More than a month ago   Pain Frequency Constant   Aggravating Factors  working through the day, sleeping at night   Pain Relieving Factors movement and pain pills   Effect of Pain on Daily Activities cannot do ADLs efficiently or do full duties at work  [facility] Adult PT Treatment/Exercise - 05/27/16 0001      Shoulder Exercises: Supine   External Rotation PROM;AAROM;10 reps   External Rotation Limitations x20 AAROM, 5 second holds    Flexion PROM;AAROM   Flexion Limitations AAROM x20, 5 second holds    ABduction PROM;AAROM   ABduction Limitations AAROM 5 second holds,      Shoulder Exercises: Seated   Flexion AAROM;15 reps   Abduction AAROM;15 reps   ABduction Limitations table slides 10x10 sec   Other Seated Exercises table slides flexion and ABD 10x10 seconds      Shoulder Exercises: Standing   Other Standing Exercises standing pendulums with 3# for shoulder distraction throughout prior  to [facility]    Other Standing Exercises fingerboard AAROM 1x10 with 5 second holds      Shoulder Exercises: Pulleys   Flexion 2 minutes   Flexion Limitations therapist scapular mobs     Manual Therapy   Manual Therapy Joint mobilization;Passive ROM   Manual therapy comments performed separately from all other skilled intervetnions    Joint Mobilization grade 3 inferior mobilizations 5x30 seconds    Soft tissue mobilization TPR Lt pec   Myofascial Release surgical incision/scar tissue   Scapular Mobilization S<>I, protraction<>retraction   Passive ROM flexion/ABD/ER    Muscle Energy Technique MET during PROM                   PT Short Term Goals - 05/23/16 1535      PT SHORT TERM GOAL #1   Title Patient to demonstrate at least 100 degrees of L shoulder flexion and abduction in order to assist in reducing pain and improving general function    Baseline 7/24- 120 degrees flexion, ABD 118   Time 3   Period Weeks   Status Achieved     PT SHORT TERM GOAL #2   Title Patient to demonstrate 90 degrees L shoulder IR and at least 45 degrees L shoulder ER in order to assist in reducing pain adn improve function    Baseline 7/24- IR WFL, ER 40 at 70 degrees ABD    Time 3   Period Weeks   Status Partially Met     PT SHORT TERM GOAL #3   Title Patient to demonstrate symmetrical neuromuscular recrutiment of all scapular and shoulder girlde muscular to demosntrate improved function and neuromuscular recruitment for functional task performance    Time 3   Period Weeks   Status Partially Met     PT SHORT TERM GOAL #4   Title Patient to experience pain no more than 4/10 L shoulder during all tasks in order to improve task performance and overall QOL    Baseline 7/24- average is around 5-6/10   Time 3   Period Weeks   Status On-going     PT SHORT TERM GOAL #5   Title Patient to be independent in correctly and consistently performing appropriate HEP, to be updated PRN     Baseline 7/24- continues to report good compliance to HEP    Time 3   Period Weeks   Status Achieved           PT Long Term Goals - 05/23/16 1538      PT LONG TERM GOAL #1   Title Patient to demonstrate at least 160 degrees L shoulder flexion and ABD in order to improve function and assist in improving overall QOL    Time 6   Period Weeks   Status On-going  PT LONG TERM GOAL #2   Title Patient to demonstrate L shoulder IR of 90 degrees and L shoulder ER of at least 80 degrees in order to improve function and assist in improving overall QOL    Time 6   Period Weeks   Status On-going     PT LONG TERM GOAL #3   Title Patient to demonstrate strength at least 4/5 in all tested muscle groups in order to improve function and reduce pain, improve regional stability    Baseline 7/24- flexion/abduction approximately 2/5, ER approximately 2/5, IR approxiamtey 4-/5    Time 6   Period Weeks   Status On-going     PT LONG TERM GOAL #4   Title Patient to report he has been able to use both UEs to dress and wash his hair in order to demosntrate improved general function and ability to perform self-care tasks    Baseline 7/24- reports that this is still very difficult    Time 6   Period Weeks   Status On-going     PT LONG TERM GOAL #5   Title Patient to report he has been able to return to light duty at work in order to assist in returning to function and improved QOL    Baseline 7/24- has returned to light duty but still on MD precautions    Time 6   Period Weeks   Status Achieved               Plan - 05/27/16 1631    Clinical Impression Statement Pt continues to have significant limitation with ROM and high pain scale with UE movements.  Session focus on improving ROM with manual soft tissue mobilizaiton to reduce tightness, PROM to improve ROM and joint mobs to improve mobiltiy and therex with AAROM exercises.  Therapist facilitation to reduce compensation of body movements  when trying to improve ROM.  No reoprts of increased pain through session.     Rehab Potential Good   PT Frequency --  5-6 more per MD request   PT Duration --  5-6 more per MD request   PT Treatment/Interventions ADLs/Self Care Home Management;Cryotherapy;Moist Heat;Functional mobility training;Therapeutic activities;Therapeutic exercise;Neuromuscular re-education;Patient/family education;Manual techniques;Passive range of motion;Taping;Electrical Stimulation;Ultrasound   PT Next Visit Plan F/U with worker's comp extended coverage past 36 sessions.  need to start AROM when able, continue ROM exercises ; progress scapular stabilization (shoulder ER/IR in closed chain positions). Consider possible upcoming DC.       Patient will benefit from skilled therapeutic intervention in order to improve the following deficits and impairments:  Hypomobility, Decreased knowledge of precautions, Decreased strength, Increased fascial restricitons, Impaired UE functional use, Pain, Increased muscle spasms, Improper body mechanics, Decreased coordination, Impaired flexibility, Postural dysfunction  Visit Diagnosis: Stiffness of left shoulder, not elsewhere classified  Pain in left shoulder  Abnormal posture  Muscle weakness (generalized)     Problem List Patient Active Problem List   Diagnosis Date Noted  . Hypokalemia   . Preop cardiovascular exam 02/24/2014  . Apical variant hypertrophic cardiomyopathy ([place]) 02/24/2014  . Tobacco abuse 02/24/2014  . HYPERTENSION, BENIGN 12/12/2008  . CHEST PAIN-UNSPECIFIED 12/12/2008  . ABNORMAL EKG 12/12/2008   Ihor Austin, LPTA; [place]  Aldona Lento 05/27/2016, 4:41 PM  [place], [place], [address] Phone: [phone]   Fax:  [phone]  Name: [name] Foster MRN: [id number] Date of Birth: 02/24/1956

## 2016-06-01 ENCOUNTER — Telehealth (HOSPITAL_COMMUNITY): Payer: Self-pay

## 2016-06-01 ENCOUNTER — Ambulatory Visit (HOSPITAL_COMMUNITY): Payer: Worker's Compensation | Attending: Orthopedic Surgery

## 2016-06-01 DIAGNOSIS — M25612 Stiffness of left shoulder, not elsewhere classified: Secondary | ICD-10-CM | POA: Insufficient documentation

## 2016-06-01 DIAGNOSIS — M25512 Pain in left shoulder: Secondary | ICD-10-CM | POA: Insufficient documentation

## 2016-06-01 DIAGNOSIS — M6281 Muscle weakness (generalized): Secondary | ICD-10-CM | POA: Insufficient documentation

## 2016-06-01 DIAGNOSIS — R293 Abnormal posture: Secondary | ICD-10-CM | POA: Insufficient documentation

## 2016-06-01 NOTE — Telephone Encounter (Signed)
No show, attempted to call multiple times though unable to get through.  [address], [place]; [facility] [phone]

## 2016-06-03 ENCOUNTER — Ambulatory Visit (HOSPITAL_COMMUNITY): Payer: Worker's Compensation

## 2016-06-03 DIAGNOSIS — R293 Abnormal posture: Secondary | ICD-10-CM

## 2016-06-03 DIAGNOSIS — M6281 Muscle weakness (generalized): Secondary | ICD-10-CM | POA: Diagnosis present

## 2016-06-03 DIAGNOSIS — M25612 Stiffness of left shoulder, not elsewhere classified: Secondary | ICD-10-CM | POA: Diagnosis present

## 2016-06-03 DIAGNOSIS — M25512 Pain in left shoulder: Secondary | ICD-10-CM | POA: Diagnosis present

## 2016-06-03 NOTE — Therapy (Signed)
[place], [place], [address] Phone: [phone]   Fax:  [phone]  Physical Therapy Treatment  Patient Details  Name: [name] Foster MRN: [id number] Date of Birth: 07/24/1956 Referring Provider: Milly Jakob  Encounter Date: 06/03/2016      PT End of Session - 06/03/16 1736    Visit Number 36   Number of Visits 39   Date for PT Re-Evaluation 06/20/16   Authorization Type Worker's Comp (36 have been approved)  04/07/2016- 12 more visits have been authorized; week of 6/12- 12 more visits authorized; 7/24- waiting to see if workers comp will approve past 56    Authorization Time Period 3/78/58 to 8/50/27; recert done on 7/41; recert done on 2/87; recert done on 8/67    Authorization - Visit Number 54   Authorization - Number of Visits 36   PT Start Time 1732   PT Stop Time 1815   PT Time Calculation (min) 43 min   Activity Tolerance Patient limited by pain   Behavior During Therapy [facility] for tasks assessed/performed      Past Medical History:  Diagnosis Date  . Abnormal EKG   . Apical variant hypertrophic cardiomyopathy ([place])   . Benign essential hypertension   . BPH (benign prostatic hyperplasia)   . Hypokalemia   . Tobacco abuse     Past Surgical History:  Procedure Laterality Date  . CARDIAC CATHETERIZATION     2008  . COLONOSCOPY N/A 02/26/2015   Procedure: COLONOSCOPY;  Surgeon: Rogene Houston, MD;  [facility];  Service: Endoscopy;  Laterality: N/A;  830  . KIDNEY SURGERY Right April 2015   benign tumor removal  . [facility]  [facility]  . SHOULDER ACROMIOPLASTY Left 01/18/2016   Procedure: SHOULDER ACROMIOPLASTY;  Surgeon: Milly Jakob, MD;  Location: [place];  Service: Orthopedics;  Laterality: Left;  . SHOULDER ARTHROSCOPY WITH ROTATOR CUFF REPAIR AND SUBACROMIAL DECOMPRESSION Left 01/18/2016   Procedure: LEFT SHOULDER ARTHROSCOPY WITH ROTATOR CUFF  REPAIR AND SUBACROMIAL DECOMPRESSION;  Surgeon: Milly Jakob, MD;  Location: [place];  Service: Orthopedics;  Laterality: Left;  PRE-OP BLOCK WITH GENERAL ANESTHESIA    There were no vitals filed for this visit.      Subjective Assessment - 06/03/16 1732    Subjective Pt continues to have pain Bil shoulder Lt> Rt pain scale 5/10.  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  Ran out of anti-inflammatory medication, continues to have pain meds.     Pertinent History HTN, tobacco abuse, hx of cardiac cath    Patient Stated Goals get back to job activites (very active)   Currently in Pain? Yes   Pain Score 5    Pain Location Shoulder   Pain Orientation Left;Right   Pain Descriptors / Indicators Aching;Sharp   Pain Type Surgical pain   Pain Radiating Towards still running down humerus to deltoid insertion   Pain Onset More than a month ago   Pain Frequency Constant   Aggravating Factors  working through the day, sleeping at night   Pain Relieving Factors movements and pain pills   Effect of Pain on Daily Activities cannot do ADLs efficiently or do full duties at work            [facility] PT Assessment - 06/03/16 0001      Assessment   Medical Diagnosis L rotator cuff repair  Referring Provider Milly Jakob   Next MD Visit Dr. Grandville Silos 8/7              [place] [facility] PT Treatment/Exercise - 06/03/16 0001      Shoulder Exercises: Supine   External Rotation PROM;AAROM;10 reps   External Rotation Limitations x20 AAROM, 5 second holds    Flexion PROM;AAROM   Flexion Limitations AAROM x20, 5 second holds    ABduction PROM;AAROM   ABduction Limitations AAROM 5 second holds,      Shoulder Exercises: Seated   Flexion AAROM;15 reps   Abduction AAROM;15 reps   ABduction Limitations table slides 10x10 sec   Other Seated Exercises table slides flexion and ABD 10x10 seconds      Shoulder Exercises: Standing   Other  Standing Exercises standing pendulums with 3# for shoulder distraction throughout prior to [facility]    Other Standing Exercises fingerboard AAROM 1x10 with 5 second holds      Manual Therapy   Manual Therapy Joint mobilization;Passive ROM;Soft tissue mobilization   Manual therapy comments performed separately from all other skilled intervetnions    Joint Mobilization grade 3 inferior mobilizations 5x30 seconds    Soft tissue mobilization TPR Lt pec   Myofascial Release surgical incision/scar tissue   Scapular Mobilization S<>I, protraction<>retraction   Passive ROM flexion/ABD/ER                   PT Short Term Goals - 05/23/16 1535      PT SHORT TERM GOAL #1   Title Patient to demonstrate at least 100 degrees of L shoulder flexion and abduction in order to assist in reducing pain and improving general function    Baseline 7/24- 120 degrees flexion, ABD 118   Time 3   Period Weeks   Status Achieved     PT SHORT TERM GOAL #2   Title Patient to demonstrate 90 degrees L shoulder IR and at least 45 degrees L shoulder ER in order to assist in reducing pain adn improve function    Baseline 7/24- IR WFL, ER 40 at 70 degrees ABD    Time 3   Period Weeks   Status Partially Met     PT SHORT TERM GOAL #3   Title Patient to demonstrate symmetrical neuromuscular recrutiment of all scapular and shoulder girlde muscular to demosntrate improved function and neuromuscular recruitment for functional task performance    Time 3   Period Weeks   Status Partially Met     PT SHORT TERM GOAL #4   Title Patient to experience pain no more than 4/10 L shoulder during all tasks in order to improve task performance and overall QOL    Baseline 7/24- average is around 5-6/10   Time 3   Period Weeks   Status On-going     PT SHORT TERM GOAL #5   Title Patient to be independent in correctly and consistently performing appropriate HEP, to be updated PRN    Baseline 7/24- continues to report  good compliance to HEP    Time 3   Period Weeks   Status Achieved           PT Long Term Goals - 05/23/16 1538      PT LONG TERM GOAL #1   Title Patient to demonstrate at least 160 degrees L shoulder flexion and ABD in order to improve function and assist in improving overall QOL    Time 6   Period Weeks   Status On-going  PT LONG TERM GOAL #2   Title Patient to demonstrate L shoulder IR of 90 degrees and L shoulder ER of at least 80 degrees in order to improve function and assist in improving overall QOL    Time 6   Period Weeks   Status On-going     PT LONG TERM GOAL #3   Title Patient to demonstrate strength at least 4/5 in all tested muscle groups in order to improve function and reduce pain, improve regional stability    Baseline 7/24- flexion/abduction approximately 2/5, ER approximately 2/5, IR approxiamtey 4-/5    Time 6   Period Weeks   Status On-going     PT LONG TERM GOAL #4   Title Patient to report he has been able to use both UEs to dress and wash his hair in order to demosntrate improved general function and ability to perform self-care tasks    Baseline 7/24- reports that this is still very difficult    Time 6   Period Weeks   Status On-going     PT LONG TERM GOAL #5   Title Patient to report he has been able to return to light duty at work in order to assist in returning to function and improved QOL    Baseline 7/24- has returned to light duty but still on MD precautions    Time 6   Period Weeks   Status Achieved               Plan - 06/03/16 1752    Clinical Impression Statement Pt continues to have significant limitation with ROM all directions with high pain scale with all movements.  Continued with manual technique to addres soft tissue mobilization, myofascial release technique to address significant joint adhesions and PROM per pt tolerance.  Measurements taken with minimal improvements since last reassessment.  Therapist facilitation  through session to improve mechanics and reduce compensation of body with majority of therex.     Rehab Potential Good   PT Treatment/Interventions ADLs/Self Care Home Management;Cryotherapy;Moist Heat;Functional mobility training;Therapeutic activities;Therapeutic exercise;Neuromuscular re-education;Patient/family education;Manual techniques;Passive range of motion;Taping;Electrical Stimulation;Ultrasound   PT Next Visit Plan F/U with MD following apt on 06/06/2016 and with worker's comp extended coverage past 36 sessions.  need to start AROM when able, continue ROM exercises ; progress scapular stabilization (shoulder ER/IR in closed chain positions). Consider possible upcoming DC.       Patient will benefit from skilled therapeutic intervention in order to improve the following deficits and impairments:  Hypomobility, Decreased knowledge of precautions, Decreased strength, Increased fascial restricitons, Impaired UE functional use, Pain, Increased muscle spasms, Improper body mechanics, Decreased coordination, Impaired flexibility, Postural dysfunction  Visit Diagnosis: Stiffness of left shoulder, not elsewhere classified  Pain in left shoulder  Abnormal posture  Muscle weakness (generalized)     Problem List Patient Active Problem List   Diagnosis Date Noted  . Hypokalemia   . Preop cardiovascular exam 02/24/2014  . Apical variant hypertrophic cardiomyopathy ([place]) 02/24/2014  . Tobacco abuse 02/24/2014  . HYPERTENSION, BENIGN 12/12/2008  . CHEST PAIN-UNSPECIFIED 12/12/2008  . ABNORMAL EKG 12/12/2008   Ihor Austin, LPTA; [place]  Aldona Lento 06/03/2016, 6:13 PM  [place] [place] [address] [place], [place], [address] Phone: [phone]   Fax:  [phone]  Name: Beckhem Isadore MRN: [id number] Date of Birth: 03/18/1956

## 2016-06-15 ENCOUNTER — Ambulatory Visit (HOSPITAL_COMMUNITY): Payer: Worker's Compensation | Admitting: Physical Therapy

## 2016-06-15 DIAGNOSIS — R293 Abnormal posture: Secondary | ICD-10-CM

## 2016-06-15 DIAGNOSIS — M25612 Stiffness of left shoulder, not elsewhere classified: Secondary | ICD-10-CM | POA: Diagnosis not present

## 2016-06-15 DIAGNOSIS — M6281 Muscle weakness (generalized): Secondary | ICD-10-CM

## 2016-06-15 DIAGNOSIS — M25512 Pain in left shoulder: Secondary | ICD-10-CM

## 2016-06-15 NOTE — Therapy (Signed)
[facility] [address] [place], [place], [address] Phone: [phone]   Fax:  [phone]  Physical Therapy Treatment (Re-Assessment)  Patient Details  Name: [name] Foster MRN: [id number] Date of Birth: 12-Apr-1956 Referring Provider: Milly Jakob   Encounter Date: 06/15/2016      PT End of Session - 06/15/16 1745    Visit Number 37   Number of Visits 41   Date for PT Re-Evaluation 07/06/16   Authorization Type Worker's Comp (48 have been approved)  04/07/2016- 12 more visits have been authorized; week of 6/12- 12 more visits authorized; 7/24- waiting to see if workers comp will approve past 50 ; 8/16- 12 more visits approved, cert done    Authorization Time Period 6/76/19 to 03/09/31; recert done on 6/71; recert done on 2/45; recert done on 8/09; recert done on 9/83    Authorization - Visit Number 56   Authorization - Number of Visits 48   PT Start Time 1655   PT Stop Time 1735   PT Time Calculation (min) 40 min   Activity Tolerance Patient limited by pain   Behavior During Therapy [facility] for tasks assessed/performed      Past Medical History:  Diagnosis Date  . Abnormal EKG   . Apical variant hypertrophic cardiomyopathy ([place])   . Benign essential hypertension   . BPH (benign prostatic hyperplasia)   . Hypokalemia   . Tobacco abuse     Past Surgical History:  Procedure Laterality Date  . CARDIAC CATHETERIZATION     2008  . COLONOSCOPY N/A 02/26/2015   Procedure: COLONOSCOPY;  Surgeon: Rogene Houston, MD;  [facility];  Service: Endoscopy;  Laterality: N/A;  830  . KIDNEY SURGERY Right April 2015   benign tumor removal  . PROSTATE ABLATION  12-2015 at [facility]  . SHOULDER ACROMIOPLASTY Left 01/18/2016   Procedure: SHOULDER ACROMIOPLASTY;  Surgeon: Milly Jakob, MD;  Location: [place];  Service: Orthopedics;  Laterality: Left;  . SHOULDER ARTHROSCOPY WITH ROTATOR CUFF REPAIR AND SUBACROMIAL  DECOMPRESSION Left 01/18/2016   Procedure: LEFT SHOULDER ARTHROSCOPY WITH ROTATOR CUFF REPAIR AND SUBACROMIAL DECOMPRESSION;  Surgeon: Milly Jakob, MD;  Location: [place];  Service: Orthopedics;  Laterality: Left;  PRE-OP BLOCK WITH GENERAL ANESTHESIA    There were no vitals filed for this visit.      Subjective Assessment - 06/15/16 1656    Subjective Patient went to see surgeon in early August, who looked at L UE as well as R UE. He got a shot in his R shoulder however patient reports it is not really doing any good, states MD is not sure if pain in R shoulder is soft tissue/bone/etc. He states that workers comp has approved more therapy for his L UE. Patient states that MD is on the same page with PT that L shoulder is not where it should be. MD has has dropped lifting limit down to 5# on L UE rather than the 10# he was on. Patient continues to have problems sleeping at night due to pain.    Pertinent History HTN, tobacco abuse, hx of cardiac cath    Patient Stated Goals get back to job activites (very active)   Currently in Pain? Yes   Pain Score 5    Pain Location Shoulder   Pain Orientation Right;Left   Pain Descriptors / Indicators Dull   Pain Type Surgical pain   Pain Radiating Towards runs up to neck from his  shoulder    Pain Onset More than a month ago   Pain Frequency Constant   Aggravating Factors  working through day, sleeping at night    Pain Relieving Factors pain pills   Effect of Pain on Daily Activities cannot do ADLs efficiently or do full duties at work             [place] - 06/15/16 0001      Assessment   Medical Diagnosis L rotator cuff repair    Referring Provider Milly Jakob    Next MD Visit Dr. Grandville Silos in September      Balance Screen   Has the patient fallen in the past 6 months Yes   How many times? 1- fall that caused injury    Has the patient had a decrease in activity level because of a fear of falling?  No    Is the patient reluctant to leave their home because of a fear of falling?  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, dig, perform manual labor    Leisure no hobbies      Observation/Other Assessments   Focus on Therapeutic Outcomes (FOTO)  67% limited (was 69% limited)     Posture/Postural Control   Posture/Postural Control Postural limitations   Postural Limitations Forward head;Rounded Shoulders   Posture Comments continues to have poor posture, pain based compensations     AROM   Left Shoulder Flexion 122 Degrees   Left Shoulder ABduction 120 Degrees   Left Shoulder Internal Rotation 75 Degrees  at approximately 80 degrees ABD    Left Shoulder External Rotation 35 Degrees  at approximately 80 degrees ABD      Strength   Overall Strength Comments definite loss of grip strength L UE per manual testing    Left Shoulder Flexion 2/5  pain limited    Left Shoulder ABduction 2/5  pain limited    Left Shoulder Internal Rotation 3-/5  pain limited    Left Shoulder External Rotation 2/5  pain limited    Left Elbow Flexion 4-/5   Left Elbow Extension 3-/5   Left Forearm Pronation 4/5   Left Forearm Supination 4/5   Left Wrist Flexion 4-/5   Left Wrist Extension 3+/5                             PT Education - 06/15/16 1743    Education provided Yes   Education Details results of re-assessment performed today; PT will trial 4 more skilled sessions however if no progress is made will DC back to MD; extensive education regarding importance of pushing AAROM and functional shoulder stretches to end ROM to assist in gaining ROM, use moist heat 10-15 minutes with skin protecting layer before HEP, increase reps and length of holds for stretches/AAROM at home    Person(s)  Educated Patient   Methods Explanation   Comprehension Verbalized understanding          PT Short Term Goals - 06/15/16 1718      PT SHORT TERM GOAL #1   Title Patient to demonstrate at least 100 degrees of L shoulder flexion and abduction in order to assist in reducing pain and improving general function    Time 3   Period Weeks  Status Achieved     PT SHORT TERM GOAL #2   Title Patient to demonstrate 90 degrees L shoulder IR and at least 45 degrees L shoulder ER in order to assist in reducing pain adn improve function    Time 3   Period Weeks   Status Partially Met     PT SHORT TERM GOAL #3   Title Patient to demonstrate symmetrical neuromuscular recrutiment of all scapular and shoulder girlde muscular to demosntrate improved function and neuromuscular recruitment for functional task performance    Time 3   Period Weeks   Status Partially Met     PT SHORT TERM GOAL #4   Title Patient to experience pain no more than 4/10 L shoulder during all tasks in order to improve task performance and overall QOL    Baseline 8/15- on average 5/10    Time 3   Period Weeks   Status On-going     PT SHORT TERM GOAL #5   Title Patient to be independent in correctly and consistently performing appropriate HEP, to be updated PRN    Time 3   Period Weeks   Status Achieved           PT Long Term Goals - 06/15/16 1719      PT LONG TERM GOAL #1   Title Patient to demonstrate at least 160 degrees L shoulder flexion and ABD in order to improve function and assist in improving overall QOL    Baseline 8/15- approximately 120 degrees    Time 6   Period Weeks   Status On-going     PT LONG TERM GOAL #2   Title Patient to demonstrate L shoulder IR of 90 degrees and L shoulder ER of at least 80 degrees in order to improve function and assist in improving overall QOL    Baseline 8/15- at approximately 80 degrees ABD, IR 75 degrees and ER 35    Time 6   Period Weeks   Status On-going      PT LONG TERM GOAL #3   Title Patient to demonstrate strength at least 4/5 in all tested muscle groups in order to improve function and reduce pain, improve regional stability    Baseline 8/16- remains limited, have not focused on this due to ongoing ROM deficits/attempts to get improved ROM    Time 6   Period Weeks   Status On-going     PT LONG TERM GOAL #4   Title Patient to report he has been able to use both UEs to dress and wash his hair in order to demosntrate improved general function and ability to perform self-care tasks    Baseline 8/15- remains difficult    Time 6   Period Weeks   Status On-going     PT LONG TERM GOAL #5   Title Patient to report he has been able to return to light duty at work in order to assist in returning to function and improved QOL    Baseline 8/15- on light duty but remains on MD precautions    Time 6   Period Weeks   Status Achieved               Plan - 06/15/16 1748    Clinical Impression Statement Re-assessment performed today. Patient returns to PT after seeing MD in early August, and reports that MD/workers comp would like him to do some more PT; he does report that MD is, like DPT, concerned about current functional  level and status of L shoulder. Upon examination, patient has not made any functional gains or changes in terms of ROM, strength, or posture in L shoulder; also tested strength of elbow, wrist, and grip strength today and these also appear to be lacking expected strength even through patient reports he has kept up with exercises for rest of UE at home. During functional ROM measures, had patient do AAROM for measures, however noted that patient does not push his shoulder joint to full extent and required overpressure from PT to reach full end range of joint today. Had extensive education session regarding current status and PT trial of 4 more sessions before DC if no progress is made at this point; also had extensive conversation  about increase in intensity/proper form of HEP as well as use of moist heat/skin protection with moist heat before performing HEP at home. At this time will trial 4 more sessions of skilled PT services, however anticipate DC and referral back to MD if no significant progress is made.    Rehab Potential Fair   PT Frequency Other (comment)  4 more skilled sessions    PT Duration Other (comment)  4 more skilled sessions   PT Treatment/Interventions ADLs/Self Care Home Management;Cryotherapy;Moist Heat;Functional mobility training;Therapeutic activities;Therapeutic exercise;Neuromuscular re-education;Patient/family education;Manual techniques;Passive range of motion;Taping;Electrical Stimulation;Ultrasound   PT Next Visit Plan focus on aggressive ROM and joint mobilization/soft tissue work; trial activities in sidelying and prone positions. LIkely DC in 4 sessions.    PT Home Exercise Plan no updates this visit   Consulted and Agree with Plan of Care Patient      Patient will benefit from skilled therapeutic intervention in order to improve the following deficits and impairments:  Hypomobility, Decreased knowledge of precautions, Decreased strength, Increased fascial restricitons, Impaired UE functional use, Pain, Increased muscle spasms, Improper body mechanics, Decreased coordination, Impaired flexibility, Postural dysfunction  Visit Diagnosis: Stiffness of left shoulder, not elsewhere classified - Plan: PT plan of care cert/re-cert  Pain in left shoulder - Plan: PT plan of care cert/re-cert  Abnormal posture - Plan: PT plan of care cert/re-cert  Muscle weakness (generalized) - Plan: PT plan of care cert/re-cert     Problem List Patient Active Problem List   Diagnosis Date Noted  . Hypokalemia   . Preop cardiovascular exam 02/24/2014  . Apical variant hypertrophic cardiomyopathy ([place]) 02/24/2014  . Tobacco abuse 02/24/2014  . HYPERTENSION, BENIGN 12/12/2008  . CHEST PAIN-UNSPECIFIED  12/12/2008  . ABNORMAL EKG 12/12/2008    Deniece Ree PT, DPT [facility] [address] [place], [place], [address] Phone: [phone]   Fax:  [phone]  Name: [name] Foster MRN: [id number] Date of Birth: [birth year]

## 2016-06-22 ENCOUNTER — Ambulatory Visit (HOSPITAL_COMMUNITY): Payer: Worker's Compensation | Admitting: Physical Therapy

## 2016-06-22 DIAGNOSIS — R293 Abnormal posture: Secondary | ICD-10-CM

## 2016-06-22 DIAGNOSIS — M25612 Stiffness of left shoulder, not elsewhere classified: Secondary | ICD-10-CM

## 2016-06-22 DIAGNOSIS — M6281 Muscle weakness (generalized): Secondary | ICD-10-CM

## 2016-06-22 DIAGNOSIS — M25512 Pain in left shoulder: Secondary | ICD-10-CM

## 2016-06-22 NOTE — Therapy (Signed)
[facility] [address] [place], [place], [address] Phone: [phone]   Fax:  [phone]  Physical Therapy Treatment  Patient Details  Name: [name] Foster MRN: [id number] Date of Birth: 1956-07-05 Referring Provider: Milly Jakob   Encounter Date: 06/22/2016      PT End of Session - 06/22/16 1801    Visit Number 38   Number of Visits 41   Date for PT Re-Evaluation 07/06/16   Authorization Type Worker's Comp (48 have been approved)  04/07/2016- 12 more visits have been authorized; week of 6/12- 12 more visits authorized; 7/24- waiting to see if workers comp will approve past 67 ; 8/16- 12 more visits approved, cert done    Authorization Time Period 4/40/10 to 2/72/53; recert done on 6/64; recert done on 4/03; recert done on 4/74; recert done on 2/59    Authorization - Visit Number 38   Authorization - Number of Visits 48   PT Start Time 1716   PT Stop Time 1757   PT Time Calculation (min) 41 min   Activity Tolerance Patient limited by pain   Behavior During Therapy [facility] for tasks assessed/performed      Past Medical History:  Diagnosis Date  . Abnormal EKG   . Apical variant hypertrophic cardiomyopathy ([place])   . Benign essential hypertension   . BPH (benign prostatic hyperplasia)   . Hypokalemia   . Tobacco abuse     Past Surgical History:  Procedure Laterality Date  . CARDIAC CATHETERIZATION     2008  . COLONOSCOPY N/A 02/26/2015   Procedure: COLONOSCOPY;  Surgeon: Rogene Houston, MD;  [facility];  Service: Endoscopy;  Laterality: N/A;  830  . KIDNEY SURGERY Right April 2015   benign tumor removal  . PROSTATE [facility]BLATION  12-[facility] at [facility]  . SHOULDER ACROMIOPLASTY Left 01/18/2016   Procedure: SHOULDER ACROMIOPLASTY;  Surgeon: Milly Jakob, MD;  Location: [facility];  Service: Orthopedics;  Laterality: Left;  . SHOULDER ARTHROSCOPY WITH ROTATOR CUFF REPAIR AND SUBACROMIAL DECOMPRESSION Left  01/18/2016   Procedure: LEFT SHOULDER ARTHROSCOPY WITH ROTATOR CUFF REPAIR AND SUBACROMIAL DECOMPRESSION;  Surgeon: Milly Jakob, MD;  Location: [facility];  Service: Orthopedics;  Laterality: Left;  PRE-OP BLOCK WITH GENERAL ANESTHESIA    There were no vitals filed for this visit.      Subjective Assessment - 06/22/16 1718    Subjective Pt states things are going well. He continues to have pain in B shoulders and he feels that something still isn't right in his shoulder. He has been working on his exercises.    Pertinent History HTN, tobacco abuse, hx of cardiac cath    Patient Stated Goals get back to job activites (very active)   Currently in Pain? Yes   Pain Score 5    Pain Location Shoulder   Pain Orientation Left   Pain Descriptors / Indicators Dull   Pain Type Surgical pain   Pain Onset More than a month ago   Pain Frequency Constant   Aggravating Factors  moving arm   Pain Relieving Factors pain medication    Effect of Pain on Daily Activities limited ADLs                         [facility] Adult PT Treatment/Exercise - 06/22/16 0001      Shoulder Exercises: Supine   Flexion AAROM;Left;15 reps   Flexion Limitations 10 sec hold at end  range    ABduction AAROM;Left;15 reps   ABduction Limitations 10 sec hold at end range      Shoulder Exercises: Seated   Retraction 15 reps;Other (comment)  tactile cues to decrease UT activation      Shoulder Exercises: Standing   Other Standing Exercises standing volleyball hold against wall with external perturbations x5 min working proximal to distal    Other Standing Exercises fingerboard x5 reps, 15 sec hold at end range   (+) trunk compensation with extension/rotation noted      Manual Therapy   Manual Therapy Joint mobilization;Myofascial release   Manual therapy comments performed separately from all other skilled intervetnions    Joint Mobilization Grade III-IV inferior/posterior/lateral Lt GH  mobs; MWM Lt shoulder ER at end range ABD   Soft tissue mobilization STM Lt ant deltoid   Myofascial Release TrP release Lt bicep   Passive ROM flexion, ABD, ER 5x20 sec each                 PT Education - 06/22/16 1800    Education provided Yes   Education Details importance of increased holds during HEP compared to 5 sec holds pt is currently performing; technique with therex    Person(s) Educated Patient   Methods Explanation   Comprehension Verbalized understanding;Need further instruction          PT Short Term Goals - 06/15/16 1718      PT SHORT TERM GOAL #1   Title Patient to demonstrate at least 100 degrees of L shoulder flexion and abduction in order to assist in reducing pain and improving general function    Time 3   Period Weeks   Status Achieved     PT SHORT TERM GOAL #2   Title Patient to demonstrate 90 degrees L shoulder IR and at least 45 degrees L shoulder ER in order to assist in reducing pain adn improve function    Time 3   Period Weeks   Status Partially Met     PT SHORT TERM GOAL #3   Title Patient to demonstrate symmetrical neuromuscular recrutiment of all scapular and shoulder girlde muscular to demosntrate improved function and neuromuscular recruitment for functional task performance    Time 3   Period Weeks   Status Partially Met     PT SHORT TERM GOAL #4   Title Patient to experience pain no more than 4/10 L shoulder during all tasks in order to improve task performance and overall QOL    Baseline 8/15- on average 5/10    Time 3   Period Weeks   Status On-going     PT SHORT TERM GOAL #5   Title Patient to be independent in correctly and consistently performing appropriate HEP, to be updated PRN    Time 3   Period Weeks   Status Achieved           PT Long Term Goals - 06/15/16 1719      PT LONG TERM GOAL #1   Title Patient to demonstrate at least 160 degrees L shoulder flexion and ABD in order to improve function and assist  in improving overall QOL    Baseline 8/15- approximately 120 degrees    Time 6   Period Weeks   Status On-going     PT LONG TERM GOAL #2   Title Patient to demonstrate L shoulder IR of 90 degrees and L shoulder ER of at least 80 degrees in order to improve function  and assist in improving overall QOL    Baseline 8/15- at approximately 80 degrees ABD, IR 75 degrees and ER 35    Time 6   Period Weeks   Status On-going     PT LONG TERM GOAL #3   Title Patient to demonstrate strength at least 4/5 in all tested muscle groups in order to improve function and reduce pain, improve regional stability    Baseline 8/16- remains limited, have not focused on this due to ongoing ROM deficits/attempts to get improved ROM    Time 6   Period Weeks   Status On-going     PT LONG TERM GOAL #4   Title Patient to report he has been able to use both UEs to dress and wash his hair in order to demosntrate improved general function and ability to perform self-care tasks    Baseline 8/15- remains difficult    Time 6   Period Weeks   Status On-going     PT LONG TERM GOAL #5   Title Patient to report he has been able to return to light duty at work in order to assist in returning to function and improved QOL    Baseline 8/15- on light duty but remains on MD precautions    Time 6   Period Weeks   Status Achieved               Plan - 06/22/16 1801    Clinical Impression Statement Today's session continued focus on manual and therex to improve Lt shoulder ROM and strength. Noting soft tissue restrictions and trigger points throughout Lt biceps tendon which I addressed with myofascial release techniques. Followed with AAROM and several other exercises to improve muscle activation of the shoulder girdle. Pt unable to achieve 90 degrees of active shoulder elevation predominantly using upper trap. Discussed HEP adherence and pt verbalized understanding.   Rehab Potential Fair   PT Frequency Other (comment)   4 more skilled sessions    PT Duration Other (comment)  4 more skilled sessions   PT Treatment/Interventions ADLs/Self Care Home Management;Cryotherapy;Moist Heat;Functional mobility training;Therapeutic activities;Therapeutic exercise;Neuromuscular re-education;Patient/family education;Manual techniques;Passive range of motion;Taping;Electrical Stimulation;Ultrasound   PT Next Visit Plan Shoulder isometrics at various points in the range; focus on aggressive ROM and joint mobilization/soft tissue work; trial activities in sidelying and prone positions. LIkely DC in 4 sessions.    PT Home Exercise Plan no updates this visit   Consulted and Agree with Plan of Care Patient      Patient will benefit from skilled therapeutic intervention in order to improve the following deficits and impairments:  Hypomobility, Decreased knowledge of precautions, Decreased strength, Increased fascial restricitons, Impaired UE functional use, Pain, Increased muscle spasms, Improper body mechanics, Decreased coordination, Impaired flexibility, Postural dysfunction  Visit Diagnosis: Stiffness of left shoulder, not elsewhere classified  Pain in left shoulder  Abnormal posture  Muscle weakness (generalized)     Problem List Patient Active Problem List   Diagnosis Date Noted  . Hypokalemia   . Preop cardiovascular exam 02/24/2014  . Apical variant hypertrophic cardiomyopathy ([place]) 02/24/2014  . Tobacco abuse 02/24/2014  . HYPERTENSION, BENIGN 12/12/2008  . CHEST PAIN-UNSPECIFIED 12/12/2008  . ABNORMAL EKG 12/12/2008    6:07 PM,06/22/16 Elly Modena PT, DPT [facility] [address] [place], [place], [address] Phone: [phone]   Fax:  [phone]  Name: Trayden Brandy MRN: [id number] Date of Birth: [birth year]

## 2016-06-29 ENCOUNTER — Ambulatory Visit (HOSPITAL_COMMUNITY): Payer: Worker's Compensation

## 2016-06-29 DIAGNOSIS — R293 Abnormal posture: Secondary | ICD-10-CM

## 2016-06-29 DIAGNOSIS — M6281 Muscle weakness (generalized): Secondary | ICD-10-CM

## 2016-06-29 DIAGNOSIS — M25512 Pain in left shoulder: Secondary | ICD-10-CM

## 2016-06-29 DIAGNOSIS — M25612 Stiffness of left shoulder, not elsewhere classified: Secondary | ICD-10-CM

## 2016-06-29 NOTE — Therapy (Signed)
[facility] [address] [place], [place], [address] Phone: [phone]   Fax:  [phone]  Physical Therapy Treatment  Patient Details  Name: [name] Foster MRN: [id number] Date of Birth: [birth year] Referring Provider: Jolyn Nap  Encounter Date: 06/29/2016      PT End of Session - 06/29/16 1727    Visit Number 39   Number of Visits 41   Date for PT Re-Evaluation 07/06/16   Authorization Type Worker's Comp (48 have been approved)  04/07/2016- 12 more visits have been authorized; week of 6/12- 12 more visits authorized; 7/24- waiting to see if workers comp will approve past 7 ; 8/16- 12 more visits approved, cert done    Authorization Time Period 4/97/02 to 6/37/85; recert done on 8/85; recert done on 0/27; recert done on 7/41; recert done on 2/87    Authorization - Visit Number 39   Authorization - Number of Visits 48   PT Start Time 1650   PT Stop Time 1735   PT Time Calculation (min) 45 min   Activity Tolerance Patient limited by pain   Behavior During Therapy [facility] for tasks assessed/performed      Past Medical History:  Diagnosis Date  . Abnormal EKG   . Apical variant hypertrophic cardiomyopathy ([place])   . Benign essential hypertension   . BPH (benign prostatic hyperplasia)   . Hypokalemia   . Tobacco abuse     Past Surgical History:  Procedure Laterality Date  . CARDIAC CATHETERIZATION     2008  . COLONOSCOPY N/A 02/26/2015   Procedure: COLONOSCOPY;  Surgeon: Rogene Houston, MD;  [facility];  Service: Endoscopy;  Laterality: N/A;  830  . KIDNEY SURGERY Right April 2015   benign tumor removal  . PROSTATE ABLATION  12-2015 at [facility]  . SHOULDER ACROMIOPLASTY Left 01/18/2016   Procedure: SHOULDER ACROMIOPLASTY;  Surgeon: Milly Jakob, MD;  Location: [facility];  Service: Orthopedics;  Laterality: Left;  . SHOULDER ARTHROSCOPY WITH ROTATOR CUFF REPAIR AND SUBACROMIAL DECOMPRESSION Left  01/18/2016   Procedure: LEFT SHOULDER ARTHROSCOPY WITH ROTATOR CUFF REPAIR AND SUBACROMIAL DECOMPRESSION;  Surgeon: Milly Jakob, MD;  Location: [facility];  Service: Orthopedics;  Laterality: Left;  PRE-OP BLOCK WITH GENERAL ANESTHESIA    There were no vitals filed for this visit.      Subjective Assessment - 06/29/16 1651    Subjective Pt stated he continues to have pain Bil shoulders.  Reports MD lowered amount of weight he has lift to 5#.     Pertinent History HTN, tobacco abuse, hx of cardiac cath    Patient Stated Goals get back to job activites (very active)   Currently in Pain? Yes   Pain Score 5    Pain Orientation Left;Right   Pain Descriptors / Indicators Sharp;Aching   Pain Type Surgical pain   Pain Radiating Towards runs up neck from shoulder   Pain Onset More than a month ago   Pain Frequency Constant   Aggravating Factors  moving arm   Pain Relieving Factors pain meds   Effect of Pain on Daily Activities limited ADLs            [facility] PT Assessment - 06/29/16 0001      Assessment   Medical Diagnosis L rotator cuff repair    Referring Provider Jolyn Nap   Next MD Visit Dr. Grandville Silos 07/13/2016  [facility] Adult PT Treatment/Exercise - 06/29/16 0001      Shoulder Exercises: Supine   External Rotation PROM;AAROM;10 reps   Flexion AAROM;Left;15 reps;PROM   Flexion Limitations 10 sec hold at end range    ABduction AAROM;Left;15 reps;PROM   ABduction Limitations 10 sec hold at end range      Shoulder Exercises: Seated   Retraction 15 reps;Other (comment)  tactile cues to decrease UT activation    Other Seated Exercises bicep curls 20x 2#     Shoulder Exercises: Standing   Other Standing Exercises standing volleyball hold against wall with external perturbations x5 min working proximal to distal    Other Standing Exercises fingerboard x5 reps, 15 sec hold at end range   (+) trunk compensation with  extension/rotation noted      Manual Therapy   Manual Therapy Joint mobilization;Myofascial release   Manual therapy comments performed separately from all other skilled intervetnions    Joint Mobilization Grade III-IV inferior/posterior/lateral Lt GH mobs; MWM Lt shoulder ER at end range ABD   Soft tissue mobilization STM Lt ant deltoid   Myofascial Release TrP release Lt bicep   Passive ROM flexion, ABD, ER 5x20 sec each                   PT Short Term Goals - 06/15/16 1718      PT SHORT TERM GOAL #1   Title Patient to demonstrate at least 100 degrees of L shoulder flexion and abduction in order to assist in reducing pain and improving general function    Time 3   Period Weeks   Status Achieved     PT SHORT TERM GOAL #2   Title Patient to demonstrate 90 degrees L shoulder IR and at least 45 degrees L shoulder ER in order to assist in reducing pain adn improve function    Time 3   Period Weeks   Status Partially Met     PT SHORT TERM GOAL #3   Title Patient to demonstrate symmetrical neuromuscular recrutiment of all scapular and shoulder girlde muscular to demosntrate improved function and neuromuscular recruitment for functional task performance    Time 3   Period Weeks   Status Partially Met     PT SHORT TERM GOAL #4   Title Patient to experience pain no more than 4/10 L shoulder during all tasks in order to improve task performance and overall QOL    Baseline 8/15- on average 5/10    Time 3   Period Weeks   Status On-going     PT SHORT TERM GOAL #5   Title Patient to be independent in correctly and consistently performing appropriate HEP, to be updated PRN    Time 3   Period Weeks   Status Achieved           PT Long Term Goals - 06/15/16 1719      PT LONG TERM GOAL #1   Title Patient to demonstrate at least 160 degrees L shoulder flexion and ABD in order to improve function and assist in improving overall QOL    Baseline 8/15- approximately 120  degrees    Time 6   Period Weeks   Status On-going     PT LONG TERM GOAL #2   Title Patient to demonstrate L shoulder IR of 90 degrees and L shoulder ER of at least 80 degrees in order to improve function and assist in improving overall QOL    Baseline 8/15- at approximately  80 degrees ABD, IR 75 degrees and ER 35    Time 6   Period Weeks   Status On-going     PT LONG TERM GOAL #3   Title Patient to demonstrate strength at least 4/5 in all tested muscle groups in order to improve function and reduce pain, improve regional stability    Baseline 8/16- remains limited, have not focused on this due to ongoing ROM deficits/attempts to get improved ROM    Time 6   Period Weeks   Status On-going     PT LONG TERM GOAL #4   Title Patient to report he has been able to use both UEs to dress and wash his hair in order to demosntrate improved general function and ability to perform self-care tasks    Baseline 8/15- remains difficult    Time 6   Period Weeks   Status On-going     PT LONG TERM GOAL #5   Title Patient to report he has been able to return to light duty at work in order to assist in returning to function and improved QOL    Baseline 8/15- on light duty but remains on MD precautions    Time 6   Period Weeks   Status Achieved               Plan - 06/29/16 1728    Clinical Impression Statement Continued session focus on improving Lt shoulder ROM and strengthening with manual and therex.  Pt continues to present with hand end feel with PROM and soft tissue restrictions and trigger point throughout Lt biceps and trapezius musculature.  Pt limited by pain with majority of exercises and AAROM/PROM.  Discussion held with compliance with HEP, pt focusing on shoulder ROM.  Added bicep curls for strengthening and noted decreased grip strengthen.  Plan to add putty and grip strengthening to HEP next session.     Rehab Potential Fair   PT Frequency --  3 more sessions   PT Duration  --  3 more sessions   PT Treatment/Interventions ADLs/Self Care Home Management;Cryotherapy;Moist Heat;Functional mobility training;Therapeutic activities;Therapeutic exercise;Neuromuscular re-education;Patient/family education;Manual techniques;Passive range of motion;Taping;Electrical Stimulation;Ultrasound   PT Next Visit Plan Shoulder isometrics at various points in the range; focus on aggressive ROM and joint mobilization/soft tissue work; trial activities in sidelying and prone positions. Add grip strengthening exercises to HEP with putty and printout next session.  LIkely DC in 3 sessions.       Patient will benefit from skilled therapeutic intervention in order to improve the following deficits and impairments:  Hypomobility, Decreased knowledge of precautions, Decreased strength, Increased fascial restricitons, Impaired UE functional use, Pain, Increased muscle spasms, Improper body mechanics, Decreased coordination, Impaired flexibility, Postural dysfunction  Visit Diagnosis: Stiffness of left shoulder, not elsewhere classified  Pain in left shoulder  Abnormal posture  Muscle weakness (generalized)     Problem List Patient Active Problem List   Diagnosis Date Noted  . Hypokalemia   . Preop cardiovascular exam 02/24/2014  . Apical variant hypertrophic cardiomyopathy ([place]) 02/24/2014  . Tobacco abuse 02/24/2014  . HYPERTENSION, BENIGN 12/12/2008  . CHEST PAIN-UNSPECIFIED 12/12/2008  . ABNORMAL EKG 12/12/2008    Ihor Austin, LPTA; [place]  Aldona Lento 06/29/2016, 5:40 PM  [facility] [place], [place], [address] Phone: [phone]   Fax:  [phone]  Name: [name] Foster MRN: [id number] Date of Birth: Aug 25, 1956

## 2016-07-01 ENCOUNTER — Ambulatory Visit (HOSPITAL_COMMUNITY): Payer: Worker's Compensation | Attending: Orthopedic Surgery

## 2016-07-01 DIAGNOSIS — M25612 Stiffness of left shoulder, not elsewhere classified: Secondary | ICD-10-CM

## 2016-07-01 DIAGNOSIS — M25512 Pain in left shoulder: Secondary | ICD-10-CM

## 2016-07-01 DIAGNOSIS — M6281 Muscle weakness (generalized): Secondary | ICD-10-CM | POA: Diagnosis present

## 2016-07-01 DIAGNOSIS — R293 Abnormal posture: Secondary | ICD-10-CM

## 2016-07-01 NOTE — Patient Instructions (Signed)
Home Exercises Program Theraputty Exercises  Do the following exercises 2 times a day using your affected hand.  1. Roll putty into a ball.  2. Make into a pancake.  3. Roll putty into a roll.  4. Pinch along log with first finger and thumb.   5. Make into a ball.  6. Roll it back into a log.   7. Pinch using thumb and side of first finger.  8. Roll into a ball, then flatten into a pancake.  9. Using your fingers, make putty into a mountain.   

## 2016-07-01 NOTE — Therapy (Signed)
[facility] [address] [place], [place], [address] Phone: [phone]   Fax:  [phone]  Physical Therapy Treatment  Patient Details  Name: [name] Foster MRN: [id number] Date of Birth: Jul 12, 1956 Referring Provider: Jolyn Nap  Encounter Date: 07/01/2016      PT End of Session - 07/01/16 1721    Visit Number 40   Number of Visits 41   Date for PT Re-Evaluation 07/06/16   Authorization Type Worker's Comp (48 have been approved)  04/07/2016- 12 more visits have been authorized; week of 6/12- 12 more visits authorized; 7/24- waiting to see if workers comp will approve past 48 ; 8/16- 12 more visits approved, cert done    Authorization Time Period 3/73/42 to 8/76/81; recert done on 1/57; recert done on 2/62; recert done on 0/35; recert done on 5/97    Authorization - Visit Number 42   Authorization - Number of Visits 48   PT Start Time 1637   PT Stop Time 1722   PT Time Calculation (min) 45 min   Activity Tolerance Patient limited by pain   Behavior During Therapy [facility] for tasks assessed/performed      Past Medical History:  Diagnosis Date  . Abnormal EKG   . Apical variant hypertrophic cardiomyopathy ([place])   . Benign essential hypertension   . BPH (benign prostatic hyperplasia)   . Hypokalemia   . Tobacco abuse     Past Surgical History:  Procedure Laterality Date  . CARDIAC CATHETERIZATION     2008  . COLONOSCOPY N/A 02/26/2015   Procedure: COLONOSCOPY;  Surgeon: Rogene Houston, MD;  [facility];  Service: Endoscopy;  Laterality: N/A;  830  . KIDNEY SURGERY Right April 2015   benign tumor removal  . [facility]OSTATE [facility]BLATION  12-[facility]  . SHOULDER ACROMIOPLASTY Left 01/18/2016   Procedure: SHOULDER ACROMIOPLASTY;  Surgeon: Milly Jakob, MD;  Location: [place];  Service: Orthopedics;  Laterality: Left;  . SHOULDER ARTHROSCOPY WITH ROTATOR CUFF REPAIR AND SUBACROMIAL DECOMPRESSION Left  01/18/2016   Procedure: LEFT SHOULDER ARTHROSCOPY WITH ROTATOR CUFF REPAIR AND SUBACROMIAL DECOMPRESSION;  Surgeon: Milly Jakob, MD;  Location: [facility];  Service: Orthopedics;  Laterality: Left;  PRE-OP BLOCK WITH GENERAL ANESTHESIA    There were no vitals filed for this visit.      Subjective Assessment - 07/01/16 1641    Subjective Pt stated pain stays about the same, current pain scale 5/10   Pertinent History HTN, tobacco abuse, hx of cardiac cath    Patient Stated Goals get back to job activites (very active)   Currently in Pain? Yes   Pain Score 5    Pain Location Shoulder   Pain Orientation Right   Pain Descriptors / Indicators Sore;Aching;Sharp   Pain Type Surgical pain   Pain Onset More than a month ago   Pain Frequency Constant   Aggravating Factors  moving arm   Pain Relieving Factors pain meds   Effect of Pain on Daily Activities limited ADLs                         [facility] Adult PT Treatment/Exercise - 07/01/16 0001      Shoulder Exercises: Supine   External Rotation PROM;AAROM;15 reps;AROM   Flexion AAROM;Left;15 reps;PROM;AROM   Flexion Limitations 10 sec hold at end range    ABduction AAROM;Left;15 reps;PROM   ABduction Limitations 10 sec hold at end range  Shoulder Exercises: Seated   Horizontal ABduction --   Flexion --   ABduction Limitations table slides 10x10 sec   Other Seated Exercises putty     Shoulder Exercises: Standing   Other Standing Exercises standing volleyball hold against wall with external perturbations x5 min working proximal to distal    Other Standing Exercises fingerboard x5 reps, 15 sec hold at end range      Manual Therapy   Manual Therapy Joint mobilization;Myofascial release;Passive ROM   Manual therapy comments performed separately from all other skilled intervetnions    Joint Mobilization Grade III-IV inferior/posterior/lateral Lt GH mobs; MWM Lt shoulder ER at end range ABD   Soft  tissue mobilization STM Lt ant deltoid   Myofascial Release TrP release Lt bicep   Scapular Mobilization S<>I, protraction<>retraction   Passive ROM flexion, ABD, ER 5x20 sec each                   PT Short Term Goals - 06/15/16 1718      PT SHORT TERM GOAL #1   Title Patient to demonstrate at least 100 degrees of L shoulder flexion and abduction in order to assist in reducing pain and improving general function    Time 3   Period Weeks   Status Achieved     PT SHORT TERM GOAL #2   Title Patient to demonstrate 90 degrees L shoulder IR and at least 45 degrees L shoulder ER in order to assist in reducing pain adn improve function    Time 3   Period Weeks   Status Partially Met     PT SHORT TERM GOAL #3   Title Patient to demonstrate symmetrical neuromuscular recrutiment of all scapular and shoulder girlde muscular to demosntrate improved function and neuromuscular recruitment for functional task performance    Time 3   Period Weeks   Status Partially Met     PT SHORT TERM GOAL #4   Title Patient to experience pain no more than 4/10 L shoulder during all tasks in order to improve task performance and overall QOL    Baseline 8/15- on average 5/10    Time 3   Period Weeks   Status On-going     PT SHORT TERM GOAL #5   Title Patient to be independent in correctly and consistently performing appropriate HEP, to be updated PRN    Time 3   Period Weeks   Status Achieved           PT Long Term Goals - 06/15/16 1719      PT LONG TERM GOAL #1   Title Patient to demonstrate at least 160 degrees L shoulder flexion and ABD in order to improve function and assist in improving overall QOL    Baseline 8/15- approximately 120 degrees    Time 6   Period Weeks   Status On-going     PT LONG TERM GOAL #2   Title Patient to demonstrate L shoulder IR of 90 degrees and L shoulder ER of at least 80 degrees in order to improve function and assist in improving overall QOL     Baseline 8/15- at approximately 80 degrees ABD, IR 75 degrees and ER 35    Time 6   Period Weeks   Status On-going     PT LONG TERM GOAL #3   Title Patient to demonstrate strength at least 4/5 in all tested muscle groups in order to improve function and reduce pain, improve regional stability  Baseline 8/16- remains limited, have not focused on this due to ongoing ROM deficits/attempts to get improved ROM    Time 6   Period Weeks   Status On-going     PT LONG TERM GOAL #4   Title Patient to report he has been able to use both UEs to dress and wash his hair in order to demosntrate improved general function and ability to perform self-care tasks    Baseline 8/15- remains difficult    Time 6   Period Weeks   Status On-going     PT LONG TERM GOAL #5   Title Patient to report he has been able to return to light duty at work in order to assist in returning to function and improved QOL    Baseline 8/15- on light duty but remains on MD precautions    Time 6   Period Weeks   Status Achieved               Plan - 07/01/16 1725    Clinical Impression Statement Continued session focuso n improving Lt shoulder ROM with therex and manual.  Pt continues to present with hard end feel with PROM all direcitons and soft tissue restrictions and trigger point throught Lt bicep and trapezius musculature.  Reviewed complaince and exercises with HEP, pt given addition of thera putty and printout to address grip strength, encouraged to continue shoulder ROM exercises and forearm/elbow strengthening exercises.  EOS pt reports pain reduce to 4/10.   Rehab Potential Fair   PT Frequency --  1 more session   PT Duration --  1 more session   PT Treatment/Interventions ADLs/Self Care Home Management;Cryotherapy;Moist Heat;Functional mobility training;Therapeutic activities;Therapeutic exercise;Neuromuscular re-education;Patient/family education;Manual techniques;Passive range of motion;Taping;Electrical  Stimulation;Ultrasound   PT Next Visit Plan Reassess next session.     PT Home Exercise Plan addition of grip, forearm and elbow strenghening exercises.  Encouraged to continue ROM based HEP      Patient will benefit from skilled therapeutic intervention in order to improve the following deficits and impairments:  Hypomobility, Decreased knowledge of precautions, Decreased strength, Increased fascial restricitons, Impaired UE functional use, Pain, Increased muscle spasms, Improper body mechanics, Decreased coordination, Impaired flexibility, Postural dysfunction  Visit Diagnosis: Stiffness of left shoulder, not elsewhere classified  Pain in left shoulder  Muscle weakness (generalized)  Abnormal posture     Problem List Patient Active Problem List   Diagnosis Date Noted  . Hypokalemia   . Preop cardiovascular exam 02/24/2014  . Apical variant hypertrophic cardiomyopathy ([place]) 02/24/2014  . Tobacco abuse 02/24/2014  . HYPERTENSION, BENIGN 12/12/2008  . CHEST PAIN-UNSPECIFIED 12/12/2008  . ABNORMAL EKG 12/12/2008   Ihor Austin, LPTA; [place]  Aldona Lento 07/01/2016, 6:20 PM  [place], [place], [address] Phone: [phone]   Fax:  [phone]  Name: [name] Foster MRN: [id number] Date of Birth: [birth year]

## 2016-07-06 ENCOUNTER — Ambulatory Visit (HOSPITAL_COMMUNITY): Payer: Worker's Compensation | Admitting: Physical Therapy

## 2016-07-06 DIAGNOSIS — M25612 Stiffness of left shoulder, not elsewhere classified: Secondary | ICD-10-CM | POA: Diagnosis not present

## 2016-07-06 DIAGNOSIS — M6281 Muscle weakness (generalized): Secondary | ICD-10-CM

## 2016-07-06 DIAGNOSIS — R293 Abnormal posture: Secondary | ICD-10-CM

## 2016-07-06 DIAGNOSIS — M25512 Pain in left shoulder: Secondary | ICD-10-CM

## 2016-07-06 NOTE — Therapy (Signed)
[facility] [address] [place], [place], [address] Phone: [phone]   Fax:  [phone]  Physical Therapy Treatment (Discharge)  Patient Details  Name: [name] Foster MRN: [id number] Date of Birth: 10-Aug-1956 Referring Provider: Jolyn Nap  Encounter Date: 07/06/2016      PT End of Session - 07/06/16 1715    Visit Number 41   Number of Visits 41   Authorization Type Worker's Comp (48 have been approved)  04/07/2016- 12 more visits have been authorized; week of 6/12- 12 more visits authorized; 7/24- waiting to see if workers comp will approve past 88 ; 8/16- 12 more visits approved, cert done    Authorization Time Period 9/83/38 to 2/50/53; recert done on 9/76; recert done on 7/34; recert done on 1/93; recert done on 7/90    Authorization - Visit Number 54   Authorization - Number of Visits 53   PT Start Time 1645   PT Stop Time 1713  total lack of progress, skilled PT services no longer appropriate    PT Time Calculation (min) 28 min   Activity Tolerance Patient limited by pain   Behavior During Therapy [facility] for tasks assessed/performed      Past Medical History:  Diagnosis Date  . Abnormal EKG   . Apical variant hypertrophic cardiomyopathy ([place])   . Benign essential hypertension   . BPH (benign prostatic hyperplasia)   . Hypokalemia   . Tobacco abuse     Past Surgical History:  Procedure Laterality Date  . CARDIAC CATHETERIZATION     2008  . COLONOSCOPY N/A 02/26/2015   Procedure: COLONOSCOPY;  Surgeon: Rogene Houston, MD;  [facility];  Service: Endoscopy;  Laterality: N/A;  830  . KIDNEY SURGERY Right April 2015   benign tumor removal  . PROSTATE ABLATION  12-2015 at [facility]  . SHOULDER ACROMIOPLASTY Left 01/18/2016   Procedure: SHOULDER ACROMIOPLASTY;  Surgeon: Milly Jakob, MD;  Location: [facility];  Service: Orthopedics;  Laterality: Left;  . SHOULDER ARTHROSCOPY WITH ROTATOR CUFF  REPAIR AND SUBACROMIAL DECOMPRESSION Left 01/18/2016   Procedure: LEFT SHOULDER ARTHROSCOPY WITH ROTATOR CUFF REPAIR AND SUBACROMIAL DECOMPRESSION;  Surgeon: Milly Jakob, MD;  Location: [facility];  Service: Orthopedics;  Laterality: Left;  PRE-OP BLOCK WITH GENERAL ANESTHESIA    There were no vitals filed for this visit.      Subjective Assessment - 07/06/16 1646    Subjective Patient arrives today stating things have been going pretty good; his arm is still around 5/10 pain, still in the same spots that he was experiencing before/all along. He is going back to his MD on the 13th of September. He has been trying to keep up with his exercises at home and got a pulley to use at home, he has been keeping up with his putty for hand/grip strength as well.  He is still having pain in his R shoulder and the shots that his MD has given him are not helping.     Pertinent History HTN, tobacco abuse, hx of cardiac cath    Patient Stated Goals get back to job activites (very active)   Currently in Pain? Yes   Pain Score 5    Pain Location Shoulder   Pain Orientation Left;Right   Pain Descriptors / Indicators Sharp;Aching   Pain Type Surgical pain   Pain Radiating Towards still goes up into neck from shoulder   Pain Onset More than a month ago  Pain Frequency Constant   Aggravating Factors  moving arm around    Pain Relieving Factors pain meds    Effect of Pain on Daily Activities limiting ADLs             [facility] PT Assessment - 07/06/16 0001      Observation/Other Assessments   Focus on Therapeutic Outcomes (FOTO)  59% limited      Posture/Postural Control   Posture/Postural Control Postural limitations   Postural Limitations Forward head;Rounded Shoulders     AROM   Left Shoulder Flexion 125 Degrees  thoracic compensation    Left Shoulder ABduction 115 Degrees  thoracic compensation    Left Shoulder Internal Rotation 85 Degrees  thoracic compensation    Left  Shoulder External Rotation 40 Degrees  thoracic compensation      Strength   Overall Strength Comments reduced grip strength L UE per manual testing    Left Shoulder Flexion 2+/5   Left Shoulder ABduction 2/5   Left Shoulder Internal Rotation 4-/5   Left Shoulder External Rotation 2/5   Left Elbow Flexion 4/5   Left Elbow Extension 4/5   Left Forearm Pronation 4/5   Left Forearm Supination 4/5   Left Wrist Flexion 4/5   Left Wrist Extension 4/5                             PT Education - 07/06/16 1714    Education provided Yes   Education Details DC today, continue with current HEP; may return with new MD order after significant acute change in status    Person(s) Educated Patient   Methods Explanation   Comprehension Verbalized understanding          PT Short Term Goals - 07/06/16 1707      PT SHORT TERM GOAL #1   Title Patient to demonstrate at least 100 degrees of L shoulder flexion and abduction in order to assist in reducing pain and improving general function    Baseline 7/24- 120 degrees flexion, ABD 118   Time 3   Period Weeks   Status Achieved     PT SHORT TERM GOAL #2   Title Patient to demonstrate 90 degrees L shoulder IR and at least 45 degrees L shoulder ER in order to assist in reducing pain adn improve function    Time 3   Period Weeks   Status Partially Met     PT SHORT TERM GOAL #3   Title Patient to demonstrate symmetrical neuromuscular recrutiment of all scapular and shoulder girlde muscular to demosntrate improved function and neuromuscular recruitment for functional task performance    Time 3   Period Weeks   Status Partially Met     PT SHORT TERM GOAL #4   Title Patient to experience pain no more than 4/10 L shoulder during all tasks in order to improve task performance and overall QOL    Baseline 9/6- still 5/10 on average    Time 3   Period Weeks   Status Not Met     PT SHORT TERM GOAL #5   Title Patient to be  independent in correctly and consistently performing appropriate HEP, to be updated PRN    Baseline 9/6- continues to report compliance with HEP    Time 3   Period Weeks   Status Achieved           PT Long Term Goals - 07/06/16 2263  PT LONG TERM GOAL #1   Title Patient to demonstrate at least 160 degrees L shoulder flexion and ABD in order to improve function and assist in improving overall QOL    Time 6   Period Weeks   Status Not Met     PT LONG TERM GOAL #2   Title Patient to demonstrate L shoulder IR of 90 degrees and L shoulder ER of at least 80 degrees in order to improve function and assist in improving overall QOL    Baseline 9/6- IR 85, ER remains limited    Time 6   Period Weeks   Status Not Met     PT LONG TERM GOAL #3   Title Patient to demonstrate strength at least 4/5 in all tested muscle groups in order to improve function and reduce pain, improve regional stability    Baseline 9/6- elbow and wrist have improved, shoulder remains quite weak    Time 6   Period Weeks   Status Partially Met     PT LONG TERM GOAL #4   Title Patient to report he has been able to use both UEs to dress and wash his hair in order to demosntrate improved general function and ability to perform self-care tasks    Baseline 9/6- remains difficult    Time 6   Period Weeks   Status Not Met     PT LONG TERM GOAL #5   Title Patient to report he has been able to return to light duty at work in order to assist in returning to function and improved QOL    Baseline 9/6- remains light duty on MD precautions    Time 6   Period Weeks   Status Achieved               Plan - 07/06/16 1716    Clinical Impression Statement Re-assessment performed today. Patient has received extensive and exhaustive skilled PT services for a total of 41 skilled sessions with primary focus being ROM via PROM/AAROM approaches. Patient has remained significantly limited with L shoulder ROM at this time  despite extensive focused attention from rehabilitation staff, and, with the exception of some improvements in elbow/wrist/grip strength, has not made significant functional improvement, leading to DC today secondary to lack of progress. Patient has received extensive education regarding HEP performance and frequency, and was educated to return to MD regarding further POC for his surgical L shoulder. Extension of skilled PT services is not appropriate at this time- DC due to lack of progress.    Rehab Potential Fair   PT Treatment/Interventions ADLs/Self Care Home Management;Cryotherapy;Moist Heat;Functional mobility training;Therapeutic activities;Therapeutic exercise;Neuromuscular re-education;Patient/family education;Manual techniques;Passive range of motion;Taping;Electrical Stimulation;Ultrasound   PT Next Visit Plan DC due to lack of progress    PT Home Exercise Plan no changes at DC    Consulted and Agree with Plan of Care Patient      Patient will benefit from skilled therapeutic intervention in order to improve the following deficits and impairments:  Hypomobility, Decreased knowledge of precautions, Decreased strength, Increased fascial restricitons, Impaired UE functional use, Pain, Increased muscle spasms, Improper body mechanics, Decreased coordination, Impaired flexibility, Postural dysfunction  Visit Diagnosis: Stiffness of left shoulder, not elsewhere classified  Pain in left shoulder  Muscle weakness (generalized)  Abnormal posture     Problem List Patient Active Problem List   Diagnosis Date Noted  . Hypokalemia   . Preop cardiovascular exam 02/24/2014  . Apical variant hypertrophic cardiomyopathy ([place]) 02/24/2014  .  Tobacco abuse 02/24/2014  . HYPERTENSION, BENIGN 12/12/2008  . CHEST PAIN-UNSPECIFIED 12/12/2008  . ABNORMAL EKG 12/12/2008   PHYSICAL THERAPY DISCHARGE SUMMARY  Visits from Start of Care: 41  Current functional level related to goals / functional  outcomes: Patient has not made significant progress with skilled PT services even after extensive and exhaustive attempts by rehabilitation staff over 41 skilled sessions. Patient no longer appropriate for skilled PT services, DC due to lack of progress with recommendation to return to MD for guidance regarding POC moving forward.    Remaining deficits: Severe ROM limitations L shoulder, postural limitations, shoulder pain, reduced functional task performance, functional weakness    Education / Equipment: Continue with current HEP as prescribed; return to MD for further guidance regarding POC moving forward; DC today but may return with new MD order if any significant acute status changes occur.  Plan: Patient agrees to discharge.  Patient goals were partially met. Patient is being discharged due to lack of progress.  ?????      Deniece Ree PT, DPT [facility] [address] [place], [place], [address] Phone: [phone]   Fax:  [phone]  Name: [name] Foster MRN: [id number] Date of Birth: May 27, 1956

## 2016-07-08 ENCOUNTER — Encounter (HOSPITAL_COMMUNITY): Payer: Self-pay

## 2016-07-13 ENCOUNTER — Encounter (HOSPITAL_COMMUNITY): Payer: Self-pay | Admitting: Physical Therapy

## 2016-07-15 ENCOUNTER — Encounter (HOSPITAL_COMMUNITY): Payer: Self-pay

## 2016-07-16 ENCOUNTER — Other Ambulatory Visit: Payer: Self-pay | Admitting: Family Medicine

## 2016-07-19 ENCOUNTER — Encounter (HOSPITAL_COMMUNITY): Payer: Self-pay | Admitting: Physical Therapy

## 2016-07-21 ENCOUNTER — Encounter (HOSPITAL_COMMUNITY): Payer: Self-pay | Admitting: Physical Therapy

## 2016-07-26 ENCOUNTER — Encounter (HOSPITAL_COMMUNITY): Payer: Self-pay | Admitting: Physical Therapy

## 2016-07-28 ENCOUNTER — Encounter (HOSPITAL_COMMUNITY): Payer: Self-pay | Admitting: Physical Therapy

## 2016-08-25 ENCOUNTER — Other Ambulatory Visit: Payer: Self-pay | Admitting: Family Medicine

## 2016-08-26 ENCOUNTER — Ambulatory Visit (INDEPENDENT_AMBULATORY_CARE_PROVIDER_SITE_OTHER): Payer: BLUE CROSS/BLUE SHIELD | Admitting: Family Medicine

## 2016-08-26 ENCOUNTER — Encounter: Payer: Self-pay | Admitting: Family Medicine

## 2016-08-26 VITALS — BP 139/86 | HR 82 | Temp 97.4°F | Ht 71.0 in | Wt 167.0 lb

## 2016-08-26 DIAGNOSIS — J0110 Acute frontal sinusitis, unspecified: Secondary | ICD-10-CM

## 2016-08-26 DIAGNOSIS — E876 Hypokalemia: Secondary | ICD-10-CM | POA: Diagnosis not present

## 2016-08-26 DIAGNOSIS — I10 Essential (primary) hypertension: Secondary | ICD-10-CM | POA: Diagnosis not present

## 2016-08-26 MED ORDER — POTASSIUM CHLORIDE CRYS ER 20 MEQ PO TBCR: 20.0000 meq | EXTENDED_RELEASE_TABLET | Freq: Every day | ORAL | 1 refills | Status: DC

## 2016-08-26 MED ORDER — FLUTICASONE PROPIONATE 50 MCG/ACT NA SUSP: 2.0000 | Freq: Every day | NASAL | 6 refills | Status: DC

## 2016-08-26 MED ORDER — AMOXICILLIN 875 MG PO TABS: 875.0000 mg | ORAL_TABLET | Freq: Two times a day (BID) | ORAL | 0 refills | Status: DC

## 2016-08-26 MED ORDER — SILDENAFIL CITRATE 20 MG PO TABS: ORAL_TABLET | ORAL | 0 refills | Status: DC

## 2016-08-26 MED ORDER — HYDROCHLOROTHIAZIDE 25 MG PO TABS: 25.0000 mg | ORAL_TABLET | Freq: Every day | ORAL | 1 refills | Status: DC

## 2016-08-26 MED ORDER — METOPROLOL TARTRATE 25 MG PO TABS: 25.0000 mg | ORAL_TABLET | Freq: Two times a day (BID) | ORAL | 1 refills | Status: DC

## 2016-08-26 NOTE — Patient Instructions (Signed)
Continue current medications. Continue good therapeutic lifestyle changes which include good diet and exercise. Fall precautions discussed with patient. If an FOBT was given today- please return it to our front desk. If you are over 58 years old - you may need Prevnar 13 or the adult Pneumonia vaccine.   After your visit with us today you will receive a survey in the mail or online from [employer] regarding your care with us. Please take a moment to fill this out. Your feedback is very important to us as you can help us better understand your patient needs as well as improve your experience and satisfaction. WE CARE ABOUT YOU!!!    

## 2016-08-26 NOTE — Telephone Encounter (Signed)
Patient has appointment today at 4:30 with [facility] for refills.

## 2016-08-26 NOTE — Telephone Encounter (Signed)
Patient NTBS for follow up and lab work  

## 2016-08-26 NOTE — Progress Notes (Signed)
   Subjective:    Patient ID: [name] Foster, male    DOB: [birth year], 60 y.o.   MRN: [id number]  HPI   Patient is here for refills of medication.  He is also concerned about sinus congestion, headache and pressure that began one week ago. Patient has not been here in some time. He had a torn rotator cuff due to a work place injury that is going to have to be redone now. He is requesting refills on his blood pressure medicines and Viagra also complains of sinus congestion and yellow drainage and cough and headache.    Review of Systems  Constitutional: Negative.   HENT: Positive for congestion, postnasal drip, sinus pressure and sneezing.   Eyes: Negative.   Respiratory: Negative.   Cardiovascular: Negative.   Gastrointestinal: Negative.   Endocrine: Negative.   Genitourinary: Negative.   Musculoskeletal: Negative.   Skin: Negative.   Allergic/Immunologic: Negative.   Neurological: Negative.   Hematological: Negative.   Psychiatric/Behavioral: Negative.         Objective:   Physical Exam  Constitutional: He is oriented to person, place, and time. He appears well-developed and well-nourished.  HENT:  Mouth/Throat: Oropharynx is clear and moist.  Sinuses are tender in the frontal area  Cardiovascular: Normal rate and regular rhythm.   Pulmonary/Chest: Effort normal and breath sounds normal.  Neurological: He is alert and oriented to person, place, and time.  Psychiatric: He has a normal mood and affect. His behavior is normal.    BP 139/86   Pulse 82   Temp 97.4 F (36.3 C) (Oral)   Ht 5\' 11"  (1.803 m)   Wt 167 lb (75.8 kg)   BMI 23.29 kg/m         Assessment & Plan:  1. HYPERTENSION, BENIGN Blood pressure is fairly well controlled on current regimen. Continue same  2. Hypokalemia There is a history of hypokalemia so potassium was refilled  3. Acute non-recurrent frontal sinusitis Will treat with amoxicillin 875 twice a day discontinue Sudafed since his  hypertensive and use Flonase for congestion and possible  Did refill generic sildenafil. He is paying about 8 or $9 per pill for Viagra and willing to try sildenafil. He takes Viagra by cutting a 100 mg pill into quarters.  Wardell Honour MD

## 2016-09-02 ENCOUNTER — Encounter: Payer: Self-pay | Admitting: Cardiology

## 2016-09-02 ENCOUNTER — Other Ambulatory Visit: Payer: Self-pay | Admitting: *Deleted

## 2016-09-02 ENCOUNTER — Ambulatory Visit (INDEPENDENT_AMBULATORY_CARE_PROVIDER_SITE_OTHER): Payer: BLUE CROSS/BLUE SHIELD | Admitting: Cardiology

## 2016-09-02 DIAGNOSIS — Z01810 Encounter for preprocedural cardiovascular examination: Secondary | ICD-10-CM

## 2016-09-02 DIAGNOSIS — Z0389 Encounter for observation for other suspected diseases and conditions ruled out: Secondary | ICD-10-CM

## 2016-09-02 DIAGNOSIS — IMO0001 Reserved for inherently not codable concepts without codable children: Secondary | ICD-10-CM | POA: Insufficient documentation

## 2016-09-02 DIAGNOSIS — Z720 Tobacco use: Secondary | ICD-10-CM

## 2016-09-02 DIAGNOSIS — I422 Other hypertrophic cardiomyopathy: Secondary | ICD-10-CM

## 2016-09-02 DIAGNOSIS — Z85528 Personal history of other malignant neoplasm of kidney: Secondary | ICD-10-CM | POA: Insufficient documentation

## 2016-09-02 DIAGNOSIS — S46912A Strain of unspecified muscle, fascia and tendon at shoulder and upper arm level, left arm, initial encounter: Secondary | ICD-10-CM

## 2016-09-02 DIAGNOSIS — I10 Essential (primary) hypertension: Secondary | ICD-10-CM | POA: Diagnosis not present

## 2016-09-02 DIAGNOSIS — R9431 Abnormal electrocardiogram [ECG] [EKG]: Secondary | ICD-10-CM

## 2016-09-02 MED ORDER — NAPROXEN 500 MG PO TABS: 500.0000 mg | ORAL_TABLET | Freq: Two times a day (BID) | ORAL | 1 refills | Status: DC

## 2016-09-02 NOTE — Assessment & Plan Note (Signed)
Pt seen today for pre op clearance prior to Lt rotator cuff revision

## 2016-09-02 NOTE — Patient Instructions (Signed)
Medication Instructions:  NO CHANGES   If you need a refill on your cardiac medications before your next appointment, please call your pharmacy.  Labwork: NONE  Follow-Up: AS NEEDED  Any Other Special Instructions Will Be Listed Below (If Applicable). CLEARED FOR LEFT SHOULDER SCOPE WITH DR Tamera Punt   Thank you for choosing [facility]!!

## 2016-09-02 NOTE — Assessment & Plan Note (Signed)
Chronic inferior, septal, lateral TWI

## 2016-09-02 NOTE — Assessment & Plan Note (Signed)
Controlled, mild LVH 2015

## 2016-09-02 NOTE — Assessment & Plan Note (Signed)
S/p nephrectomy April 2015 at [facility]

## 2016-09-02 NOTE — Progress Notes (Signed)
09/02/2016 [name] Foster   1956-03-21  [id number]  Primary Physician [name] Foster Primary Cardiologist: Dr [name] Foster  HPI:  60 y/o AA male seen in 2008- for an abnormal EKG-s/p cath that showed normal coronaries with LV apical AK. He was seen again in April 2015 as a pre op clearance prior to nephrectomy for renal cell Ca. Echo then was normal-no WMA. He tolerated this well. Since then he has been followed by his PCP for HTN. The pt had a TUR in Feb 2017 and tolerated this well except for some post procedure bleeding. In the spring this year he fell in a open manhole (works for the [employer]) and injured both his shoulders. He underwent Lt rotator cuff repair in April 2017 without problems but needs a revision secondary to decreased ROM. He will also need his Rt shoulder operated on as well when he recovers. Since we saw him in 2015 he denies chest pain or unusual dyspnea. He able to do pretty much any activity he wants without restriction except from his shoulder.    Current Outpatient Prescriptions  Medication Sig Dispense Refill  . amoxicillin (AMOXIL) 875 MG tablet Take 1 tablet (875 mg total) by mouth 2 (two) times daily. 20 tablet 0  . cyclobenzaprine (FLEXERIL) 5 MG tablet Take 1 tablet (5 mg total) by mouth 3 (three) times daily as needed for muscle spasms. 30 tablet 0  . fluticasone (FLONASE) 50 MCG/ACT nasal spray Place 2 sprays into both nostrils daily. 16 g 6  . hydrochlorothiazide (HYDRODIURIL) 25 MG tablet Take 1 tablet (25 mg total) by mouth daily. 90 tablet 1  . metoprolol tartrate (LOPRESSOR) 25 MG tablet Take 1 tablet (25 mg total) by mouth 2 (two) times daily. 180 tablet 1  . Multiple Vitamins-Minerals (MULTIVITAMIN PO) Take 1 tablet by mouth daily.    . naproxen (NAPROSYN) 500 MG tablet Take 1 tablet (500 mg total) by mouth 2 (two) times daily with a meal. 60 tablet 1  . Phenyleph-CPM-DM-APAP (ALKA-SELTZER PLUS COLD & FLU) 03-01-09-250 MG TBEF Take by mouth.    .  potassium chloride SA (K-DUR,KLOR-CON) 20 MEQ tablet Take 1 tablet (20 mEq total) by mouth daily. 90 tablet 1  . predniSONE (DELTASONE) 1 MG tablet Take 1 mg by mouth daily with breakfast.    . sildenafil (REVATIO) 20 MG tablet 1 or 2 tablets as needed 30 tablet 0  . tamsulosin (FLOMAX) 0.4 MG CAPS capsule Take 0.4 mg by mouth daily.  11   No current facility-administered medications for this visit.     No Known Allergies  Social History   Social History  . Marital status: Legally Separated    Spouse name: N/A  . Number of children: 1  . Years of education: N/A   Occupational History  . Full time Teacher, early years/pre   .  [employer]   Social History Main Topics  . Smoking status: Current Every Day Smoker    Packs/day: 1.00    Years: 28.00    Types: Cigarettes  . Smokeless tobacco: Never Used  . Alcohol use No  . Drug use: No  . Sexual activity: Not on file   Other Topics Concern  . Not on file   Social History Narrative   Divorced     Review of Systems: General: negative for chills, fever, night sweats or weight changes.  Cardiovascular: negative for chest pain, dyspnea on exertion, edema, orthopnea, palpitations, paroxysmal nocturnal dyspnea or shortness of  breath-recent sinusitis, bronchitis-on ABs Dermatological: negative for rash Respiratory: negative for cough or wheezing Urologic: negative for hematuria Abdominal: negative for nausea, vomiting, diarrhea, bright red blood per rectum, melena, or hematemesis Neurologic: negative for visual changes, syncope, or dizziness All other systems reviewed and are otherwise negative except as noted above.    Blood pressure 123/72, pulse 74, height 5\' 10"  (1.778 m), weight 164 lb (74.4 kg).  General appearance: alert, cooperative and no distress Neck: no carotid bruit and no JVD Lungs: few rhonchi Lt base Heart: regular rate and rhythm Abdomen: soft, non-tender; bowel sounds normal; no masses,  no  organomegaly Extremities: extremities normal, atraumatic, no cyanosis or edema Pulses: 2+ and symmetric Skin: Skin color, texture, turgor normal. No rashes or lesions Neurologic: Grossly normal  EKG NSR TWI 2-3-F, and V2-V6  ASSESSMENT AND PLAN:   Preop cardiovascular exam Pt seen today for pre op clearance prior to Lt rotator cuff revision  Apical variant hypertrophic cardiomyopathy ([name] Foster) This was not seen on 2015 echo  Essential hypertension Controlled, mild LVH 2015  Tobacco abuse Still smoking, recent sinusitis  Normal coronary arteries 2008 No angina or DOE  H/O renal cell cancer S/p nephrectomy April 2015 at [facility]  ABNORMAL EKG Chronic inferior, septal, lateral TWI   PLAN  Mr [name] Foster has an abnormal EKG but had normal coronaries in 2008 and normal LVF with no WMA in 2015, and he has no symptoms to suggest angina or CHF. Echo was unremarkable in 2015 except for mild LVH, the previous apical WMA was not seen. He denies any chest pain or dyspnea.  No further cardiac work up indicated prior to his Lt shoulder revision. We will be available as needed peri -op. F/U with PCP for HTN (currently controlled).   Kerin Ransom PA-C 09/02/2016 2:54 PM

## 2016-09-02 NOTE — Assessment & Plan Note (Signed)
This was not seen on 2015 echo

## 2016-09-02 NOTE — Assessment & Plan Note (Signed)
No angina or DOE

## 2016-09-02 NOTE — Assessment & Plan Note (Signed)
Still smoking, recent sinusitis

## 2016-09-12 ENCOUNTER — Encounter (HOSPITAL_COMMUNITY): Payer: Self-pay | Admitting: Occupational Therapy

## 2016-09-12 ENCOUNTER — Ambulatory Visit (HOSPITAL_COMMUNITY): Payer: Worker's Compensation | Attending: Orthopedic Surgery | Admitting: Occupational Therapy

## 2016-09-12 DIAGNOSIS — R29898 Other symptoms and signs involving the musculoskeletal system: Secondary | ICD-10-CM | POA: Insufficient documentation

## 2016-09-12 DIAGNOSIS — M25512 Pain in left shoulder: Secondary | ICD-10-CM | POA: Diagnosis present

## 2016-09-12 DIAGNOSIS — M25612 Stiffness of left shoulder, not elsewhere classified: Secondary | ICD-10-CM | POA: Insufficient documentation

## 2016-09-12 NOTE — Therapy (Addendum)
[facility] [address] [place], [place], [address] Phone: [phone]   Fax:  [phone]  Occupational Therapy Evaluation  Patient Details  Name: [name] Foster MRN: [id number] Date of Birth: June 05, 1956 Referring Provider: Dr. Tania Ade  Encounter Date: 09/12/2016      OT End of Session - 09/12/16 1423    Visit Number 1   Number of Visits 12   Date for OT Re-Evaluation 10/12/16   Authorization Type BCBS/Worker's Comp   Authorization Time Period Worker's Comp has not contacted [facility] as of 11/13. BCBS visit limit is 30   Authorization - Visit Number 1   Authorization - Number of Visits 30   OT Start Time [id number]   OT Stop Time 1419   OT Time Calculation (min) 31 min   Activity Tolerance Patient tolerated treatment well   Behavior During Therapy WFL for tasks assessed/performed      Past Medical History:  Diagnosis Date  . Abnormal EKG   . Apical variant hypertrophic cardiomyopathy ([place])   . Benign essential hypertension   . BPH (benign prostatic hyperplasia)   . Hypokalemia   . Tobacco abuse     Past Surgical History:  Procedure Laterality Date  . CARDIAC CATHETERIZATION     2008  . COLONOSCOPY N/A 02/26/2015   Procedure: COLONOSCOPY;  Surgeon: Rogene Houston, MD;  [facility];  Service: Endoscopy;  Laterality: N/A;  830  . KIDNEY SURGERY Right April 2015   benign tumor removal  . [facility]  . SHOULDER ACROMIOPLASTY Left 01/18/2016   Procedure: SHOULDER ACROMIOPLASTY;  Surgeon: Milly Jakob, MD;  Location: [facility];  Service: Orthopedics;  Laterality: Left;  . SHOULDER ARTHROSCOPY WITH ROTATOR CUFF REPAIR AND SUBACROMIAL DECOMPRESSION Left 01/18/2016   Procedure: LEFT SHOULDER ARTHROSCOPY WITH ROTATOR CUFF REPAIR AND SUBACROMIAL DECOMPRESSION;  Surgeon: Milly Jakob, MD;  Location: [facility];  Service: Orthopedics;  Laterality: Left;  PRE-OP  BLOCK WITH GENERAL ANESTHESIA    There were no vitals filed for this visit.      Subjective Assessment - 09/12/16 1356    Subjective  S: I had the surgery on Thursday.    Pertinent History Pt is a 60 y/o male s/p left capsular release after complications with previous L RCR on 01/18/2016. Pt reports MD scraped significant amounts of scar tissue from surgical site, was instructed to wear sling for 72 hours after surgery. Pt referred to occupational therapy for evaluation and treatment by Dr. Tania Ade.    Patient Stated Goals To be able to use my left arm.    Currently in Pain? Yes   Pain Score 4    Pain Location Shoulder   Pain Orientation Left   Pain Descriptors / Indicators Aching;Sore   Pain Type Acute pain   Pain Radiating Towards n/a   Pain Onset In the past 7 days   Pain Frequency Constant   Aggravating Factors  movement   Pain Relieving Factors pain medication, rest   Effect of Pain on Daily Activities unable to use LUE during daily tasks   Multiple Pain Sites No           [facility] OT Assessment - 09/12/16 1348      Assessment   Diagnosis Left capsular release s/p Left RCR   Referring Provider Dr. Tania Ade   Onset Date 09/08/16   Prior Therapy PT after L RCR     Precautions  Required Braces or Orthoses Sling  first 72 hours     Balance Screen   Has the patient fallen in the past 6 months No   Has the patient had a decrease in activity level because of a fear of falling?  No   Is the patient reluctant to leave their home because of a fear of falling?  No     Home  Environment   Family/patient expects to be discharged to: Private residence   Lives With Spouse     Prior Function   Level of [place] Full time employment   [place], weedeating, etc     ADL   ADL comments Pt is having difficulty with dressing, bathing, reaching out and up, work tasks     Written Expression    Dominant Hand Right     Cognition   Overall Cognitive Status Within Functional Limits for tasks assessed     ROM / Strength   AROM / PROM / Strength AROM;PROM;Strength     Palpation   Palpation comment Pt has mod fascial restrictions in left upper arm, trapezius, and scapularis regions.      AROM   Overall AROM Comments Assessed supine and seated, er/IR adducted   AROM Assessment Site Shoulder   Right/Left Shoulder Left   Left Shoulder Flexion 22 Degrees  supine: 10   Left Shoulder ABduction 26 Degrees  supine: 45   Left Shoulder Internal Rotation 90 Degrees   Left Shoulder External Rotation 0 Degrees  supine: 11     PROM   Overall PROM Comments Assessed supine, er/IR adducted   PROM Assessment Site Shoulder   Right/Left Shoulder Left   Left Shoulder Flexion 35 Degrees   Left Shoulder ABduction 61 Degrees   Left Shoulder Internal Rotation 90 Degrees   Left Shoulder External Rotation 24 Degrees     Strength   Overall Strength Comments Assessed seated, er/IR adducted   Strength Assessment Site Shoulder   Right/Left Shoulder Left   Left Shoulder Flexion 2-/5   Left Shoulder ABduction 2-/5   Left Shoulder Internal Rotation 3/5   Left Shoulder External Rotation 2-/5                         OT Education - 09/12/16 1423    Education provided Yes   Education Details educated on table slides   Person(s) Educated Patient   Methods Explanation;Demonstration;Handout   Comprehension Verbalized understanding;Returned demonstration          OT Short Term Goals - 09/12/16 1427      OT SHORT TERM GOAL #1   Title Pt will be educated on HEP.    Time 4   Period Weeks   Status New     OT SHORT TERM GOAL #2   Title Pt will decrease pain in LUE to 4/10 or less to improve ability to complete daily tasks.   Time 4   Period Weeks   Status New     OT SHORT TERM GOAL #3   Title Pt will decrease LUE fascial restrictions to min amounts to improve mobility in  LUE during functional tasks.    Time 4   Period Weeks   Status New     OT SHORT TERM GOAL #4   Title Pt will improve A/ROM of LUE by 50% to improve ability to use LUE during ADL completion.    Time 4  Period Weeks   Status New     OT SHORT TERM GOAL #5   Title Pt will improve LUE strength to 3/5 to increase ability to use LUE as assist during ADL and work tasks.   Time 4   Period Weeks   Status New                  Plan - 09/12/16 1424    Clinical Impression Statement A: Pt is a 60 y/o male s/p left capsular release on 09/07/16 presenting with increased pain and fascial restrictions, decreased range of motion, strength, and functional use of LUE during daily and work tasks. Pt provided with and educated on table sliding HEP.    Rehab Potential Good   OT Frequency 3x / week   OT Duration 4 weeks   OT Treatment/Interventions Self-care/ADL training;Therapeutic exercise;Patient/family education;Manual Therapy;Cryotherapy;Therapeutic activities;Electrical Stimulation;Moist Heat;Passive range of motion   Plan P: Pt will benefit from skilled OT services to decrease pain and fascial restrictions, increase range of motion, strength, and functional use of LUE. Treatment plan: myofascial release, manual therapy, P/ROM, AA/ROM, A/ROM, scapular stability and strengthening, general LUE strengthening   OT Home Exercise Plan 11/13: table slides   Consulted and Agree with Plan of Care Patient      Patient will benefit from skilled therapeutic intervention in order to improve the following deficits and impairments:  Decreased activity tolerance, Decreased strength, Impaired flexibility, Decreased range of motion, Pain, Increased fascial restricitons, Impaired UE functional use  Visit Diagnosis: Stiffness of left shoulder, not elsewhere classified  Acute pain of left shoulder  Other symptoms and signs involving the musculoskeletal system    Problem List Patient Active Problem List    Diagnosis Date Noted  . Normal coronary arteries 2008 09/02/2016  . H/O renal cell cancer 09/02/2016  . Hypokalemia   . Preop cardiovascular exam 02/24/2014  . Apical variant hypertrophic cardiomyopathy ([place]) 02/24/2014  . Tobacco abuse 02/24/2014  . Essential hypertension 12/12/2008  . CHEST PAIN-UNSPECIFIED 12/12/2008  . ABNORMAL EKG 12/12/2008   Guadelupe Sabin, OTR/L  [phone] 09/12/2016, 3:29 PM  [facility] [address] [place], [place], [address] Phone: [phone]   Fax:  [phone]  Name: [name] Foster MRN: [id number] Date of Birth: [birth year]

## 2016-09-14 ENCOUNTER — Ambulatory Visit (HOSPITAL_COMMUNITY): Payer: Worker's Compensation | Admitting: Specialist

## 2016-09-14 DIAGNOSIS — M25612 Stiffness of left shoulder, not elsewhere classified: Secondary | ICD-10-CM | POA: Diagnosis not present

## 2016-09-14 DIAGNOSIS — M25512 Pain in left shoulder: Secondary | ICD-10-CM

## 2016-09-14 DIAGNOSIS — R29898 Other symptoms and signs involving the musculoskeletal system: Secondary | ICD-10-CM

## 2016-09-14 NOTE — Therapy (Signed)
[facility] [place], [place], [address] Phone: [phone]   Fax:  [phone]  Occupational Therapy Treatment  Patient Details  Name: [name] Foster MRN: [id number] Date of Birth: 1956-06-09 Referring Provider: Dr. Tania Ade  Encounter Date: 09/14/2016      OT End of Session - 09/14/16 1208    Visit Number 2   Number of Visits 12   Date for OT Re-Evaluation 10/12/16   Authorization Type BCBS/Worker's Comp   Authorization Time Period Worker's Comp has not contacted [facility] as of 11/13. BCBS visit limit is 30   Authorization - Visit Number 2   Authorization - Number of Visits 30   OT Start Time 1120   OT Stop Time 1159   OT Time Calculation (min) 39 min   Activity Tolerance Patient tolerated treatment well   Behavior During Therapy WFL for tasks assessed/performed      Past Medical History:  Diagnosis Date  . Abnormal EKG   . Apical variant hypertrophic cardiomyopathy ([place])   . Benign essential hypertension   . BPH (benign prostatic hyperplasia)   . Hypokalemia   . Tobacco abuse     Past Surgical History:  Procedure Laterality Date  . CARDIAC CATHETERIZATION     2008  . COLONOSCOPY N/A 02/26/2015   Procedure: COLONOSCOPY;  Surgeon: Rogene Houston, MD;  [facility];  Service: Endoscopy;  Laterality: N/A;  830  . KIDNEY SURGERY Right April 2015   benign tumor removal  . PROSTATE ABLATION  12-[facility]  . SHOULDER ACROMIOPLASTY Left 01/18/2016   Procedure: SHOULDER ACROMIOPLASTY;  Surgeon: Milly Jakob, MD;  Location: [facility];  Service: Orthopedics;  Laterality: Left;  . SHOULDER ARTHROSCOPY WITH ROTATOR CUFF REPAIR AND SUBACROMIAL DECOMPRESSION Left 01/18/2016   Procedure: LEFT SHOULDER ARTHROSCOPY WITH ROTATOR CUFF REPAIR AND SUBACROMIAL DECOMPRESSION;  Surgeon: Milly Jakob, MD;  Location: [facility];  Service: Orthopedics;  Laterality: Left;  PRE-OP  BLOCK WITH GENERAL ANESTHESIA    There were no vitals filed for this visit.      Subjective Assessment - 09/14/16 1122    Subjective  S:  The left shoulder is still sore.    Currently in Pain? Yes   Pain Score 5    Pain Location Shoulder   Pain Orientation Left   Pain Descriptors / Indicators Aching            [facility] OT Assessment - 09/14/16 0001      Assessment   Diagnosis Left capsular release s/p Left RCR     Precautions   Required Braces or Orthoses Sling                  OT Treatments/Exercises (OP) - 09/14/16 0001      Exercises   Exercises Shoulder     Shoulder Exercises: Supine   Protraction PROM;AAROM;10 reps   External Rotation PROM;AAROM;10 reps   Internal Rotation PROM;AAROM;10 reps   Flexion PROM;AAROM;10 reps   ABduction PROM;AAROM;10 reps     Shoulder Exercises: Seated   Elevation AROM;10 reps   Extension AROM;10 reps   Row AROM;10 reps   Other Seated Exercises elbow flexion, extension, supination, pronation, wrist flexion, extension 10 times each   Other Seated Exercises seated short arc A/ROM shoulder flexion and abduction 0-30 with elbow flexed to 90 10 times each     Shoulder Exercises: Therapy Ball   Flexion 20 reps   ABduction 20 reps  Shoulder Exercises: Isometric Strengthening   Flexion Supine;3X3"   Extension Supine;3X3"   External Rotation Supine;3X3"   Internal Rotation Supine;3X3"   ABduction Supine;3X3"   ADduction Supine;3X3"     Manual Therapy   Manual Therapy Myofascial release   Manual therapy comments performed separately from all other skilled intervetnions    Myofascial Release myofascial release and manual stretching to left upper arm, scapular, and shoulder regions to decrease pain and restrictions and improve pain free mobility.                  OT Education - 09/14/16 1208    Education provided Yes   Education Details educated on A/ROM retraction and elevation to improve scapular stability  allowing for greater A/ROM.  Reviewed plan of care and issued patient a copy.   Person(s) Educated Patient   Methods Explanation;Demonstration;Handout   Comprehension Verbalized understanding;Returned demonstration          OT Short Term Goals - 09/14/16 1211      OT SHORT TERM GOAL #1   Title Pt will be educated on HEP.    Time 4   Period Weeks   Status On-going     OT SHORT TERM GOAL #2   Title Pt will decrease pain in LUE to 4/10 or less to improve ability to complete daily tasks.   Time 4   Period Weeks   Status On-going     OT SHORT TERM GOAL #3   Title Pt will decrease LUE fascial restrictions to min amounts to improve mobility in LUE during functional tasks.    Time 4   Period Weeks   Status On-going     OT SHORT TERM GOAL #4   Title Pt will improve A/ROM of LUE by 50% to improve ability to use LUE during ADL completion.    Time 4   Period Weeks   Status On-going     OT SHORT TERM GOAL #5   Title Pt will improve LUE strength to 3/5 to increase ability to use LUE as assist during ADL and work tasks.   Time 4   Period Weeks   Status On-going                  Plan - 09/14/16 1209    Clinical Impression Statement A:  Patient very guarded with left shoulder movement, required max verbal guidance and tactile cues with A/ROM elevation, row, extension for proper positioning and technique.     Plan P:  Improve relaxation during P/ROM and AA/ROM so that movement is fluid vs forced.  Decrease amount of verbal guidance and tactile cues required with scapular stability exercises.  Continue short arm A/ROM, scapular stability, and improving AA/ROM and P/ROM to [place] 11/13: table slides 09/14/16:  elevation and row.      Patient will benefit from skilled therapeutic intervention in order to improve the following deficits and impairments:  Decreased activity tolerance, Decreased strength, Impaired flexibility, Decreased range of motion, Pain,  Increased fascial restricitons, Impaired UE functional use  Visit Diagnosis: Stiffness of left shoulder, not elsewhere classified  Acute pain of left shoulder  Other symptoms and signs involving the musculoskeletal system    Problem List Patient Active Problem List   Diagnosis Date Noted  . Normal coronary arteries 2008 09/02/2016  . H/O renal cell cancer 09/02/2016  . Hypokalemia   . Preop cardiovascular exam 02/24/2014  . Apical variant hypertrophic cardiomyopathy ([place]) 02/24/2014  .  Tobacco abuse 02/24/2014  . Essential hypertension 12/12/2008  . CHEST PAIN-UNSPECIFIED 12/12/2008  . ABNORMAL EKG 12/12/2008    Vangie Bicker, Johnson, OTR/L [phone]  09/14/2016, 12:13 PM  [place], [place], [address] Phone: [phone]   Fax:  [phone]  Name: Bowman Blazina MRN: [id number] Date of Birth: 1956-04-01

## 2016-09-14 NOTE — Addendum Note (Signed)
Addended by: Guadelupe Sabin A on: 09/14/2016 09:40 AM   Modules accepted: Orders

## 2016-09-16 ENCOUNTER — Ambulatory Visit (HOSPITAL_COMMUNITY): Payer: Worker's Compensation | Admitting: Occupational Therapy

## 2016-09-16 ENCOUNTER — Encounter (HOSPITAL_COMMUNITY): Payer: Self-pay | Admitting: Occupational Therapy

## 2016-09-16 DIAGNOSIS — M25612 Stiffness of left shoulder, not elsewhere classified: Secondary | ICD-10-CM

## 2016-09-16 DIAGNOSIS — R29898 Other symptoms and signs involving the musculoskeletal system: Secondary | ICD-10-CM

## 2016-09-16 DIAGNOSIS — M25512 Pain in left shoulder: Secondary | ICD-10-CM

## 2016-09-16 NOTE — Therapy (Signed)
[place], [place], [address] Phone: [phone]   Fax:  [phone]  Occupational Therapy Treatment  Patient Details  Name: [name] Foster MRN: [id number] Date of Birth: [birth year] Referring Provider: Dr. Tania Ade  Encounter Date: 09/16/2016      OT End of Session - 09/16/16 1426    Visit Number 3   Number of Visits 12   Date for OT Re-Evaluation 10/12/16   Authorization Type BCBS/Worker's Comp   Authorization Time Period Worker's Comp has not contacted [facility] as of 11/13. BCBS visit limit is 30   Authorization - Visit Number 3   Authorization - Number of Visits 30   OT Start Time 1301   OT Stop Time 1341   OT Time Calculation (min) 40 min   Activity Tolerance Patient tolerated treatment well   Behavior During Therapy WFL for tasks assessed/performed      Past Medical History:  Diagnosis Date  . Abnormal EKG   . Apical variant hypertrophic cardiomyopathy ([place])   . Benign essential hypertension   . BPH (benign prostatic hyperplasia)   . Hypokalemia   . Tobacco abuse     Past Surgical History:  Procedure Laterality Date  . CARDIAC CATHETERIZATION     2008  . COLONOSCOPY N/A 02/26/2015   Procedure: COLONOSCOPY;  Surgeon: Rogene Houston, MD;  [facility];  Service: Endoscopy;  Laterality: N/A;  830  . KIDNEY SURGERY Right April 2015   benign tumor removal  . PROSTATE ABLATION  12-2015 at [facility]  . SHOULDER ACROMIOPLASTY Left 01/18/2016   Procedure: SHOULDER ACROMIOPLASTY;  Surgeon: Milly Jakob, MD;  Location: [facility];  Service: Orthopedics;  Laterality: Left;  . SHOULDER ARTHROSCOPY WITH ROTATOR CUFF REPAIR AND SUBACROMIAL DECOMPRESSION Left 01/18/2016   Procedure: LEFT SHOULDER ARTHROSCOPY WITH ROTATOR CUFF REPAIR AND SUBACROMIAL DECOMPRESSION;  Surgeon: Milly Jakob, MD;  Location: [facility];  Service: Orthopedics;  Laterality: Left;  PRE-OP  BLOCK WITH GENERAL ANESTHESIA    There were no vitals filed for this visit.      Subjective Assessment - 09/16/16 1302    Subjective  S: I had trouble sleeping last night.    Currently in Pain? Yes   Pain Score 5    Pain Location Shoulder   Pain Orientation Left   Pain Descriptors / Indicators Aching   Pain Type Acute pain   Pain Radiating Towards n/a   Pain Onset In the past 7 days   Pain Frequency Constant   Aggravating Factors  movement`   Pain Relieving Factors pain medication, rest   Effect of Pain on Daily Activities unable to use LUE during daily tasks   Multiple Pain Sites No            [facility] OT Assessment - 09/16/16 1322      Assessment   Diagnosis Left capsular release s/p Left RCR     Precautions   Precautions None   Required Braces or Orthoses Sling                  OT Treatments/Exercises (OP) - 09/16/16 1303      Exercises   Exercises Shoulder     Shoulder Exercises: Supine   Protraction PROM;AAROM;10 reps   External Rotation PROM;AAROM;10 reps   Internal Rotation PROM;AAROM;10 reps   Flexion PROM;AAROM;10 reps   ABduction PROM;AAROM;10 reps     Shoulder Exercises: Seated   Elevation AROM;10 reps  Extension AROM;10 reps   Row AROM;10 reps   Other Seated Exercises elbow flexion/extension, A/ROM 10X   Other Seated Exercises seated short arm A/ROM shoulder flexion and abduction 0-30 with elbow flexed to 90 10 times each     Shoulder Exercises: Standing   Other Standing Exercises PVC pipe slide, used RUE to assist in initiating movement. 10X     Shoulder Exercises: Therapy Ball   Flexion 20 reps   ABduction 20 reps     Shoulder Exercises: Isometric Strengthening   Flexion Supine;3X3"   Extension Supine;3X3"   External Rotation Supine;3X3"   Internal Rotation Supine;3X3"   ABduction Supine;3X3"   ADduction Supine;3X3"     Manual Therapy   Manual Therapy Myofascial release   Manual therapy comments performed separately from  all other skilled intervetnions    Myofascial Release myofascial release and manual stretching to left upper arm, scapular, and shoulder regions to decrease pain and restrictions and improve pain free mobility.                    OT Short Term Goals - 09/14/16 1211      OT SHORT TERM GOAL #1   Title Pt will be educated on HEP.    Time 4   Period Weeks   Status On-going     OT SHORT TERM GOAL #2   Title Pt will decrease pain in LUE to 4/10 or less to improve ability to complete daily tasks.   Time 4   Period Weeks   Status On-going     OT SHORT TERM GOAL #3   Title Pt will decrease LUE fascial restrictions to min amounts to improve mobility in LUE during functional tasks.    Time 4   Period Weeks   Status On-going     OT SHORT TERM GOAL #4   Title Pt will improve A/ROM of LUE by 50% to improve ability to use LUE during ADL completion.    Time 4   Period Weeks   Status On-going     OT SHORT TERM GOAL #5   Title Pt will improve LUE strength to 3/5 to increase ability to use LUE as assist during ADL and work tasks.   Time 4   Period Weeks   Status On-going                  Plan - 09/16/16 1426    Clinical Impression Statement A: Pt continues to be very guarded with LUE mobility, requires consistent verbal and tactile cues for form. Improvement in scapular A/ROM this session, intermittent verbal cuing for positioning. Added PVC pipe, pt used RUE to assist in initiating motion, finishing with LUE only.    Plan P: Continue to work on improving relaxation during ROM exercises to promote fluidity of movement. Continue working to improve tolerance to ROM and gaining [place] Exercise Plan 11/13: table slides 09/14/16:  elevation and row.      Patient will benefit from skilled therapeutic intervention in order to improve the following deficits and impairments:  Decreased activity tolerance, Decreased strength, Impaired flexibility, Decreased range of  motion, Pain, Increased fascial restricitons, Impaired UE functional use  Visit Diagnosis: Stiffness of left shoulder, not elsewhere classified  Acute pain of left shoulder  Other symptoms and signs involving the musculoskeletal system    Problem List Patient Active Problem List   Diagnosis Date Noted  . Normal coronary arteries 2008 09/02/2016  . H/O renal  cell cancer 09/02/2016  . Hypokalemia   . Preop cardiovascular exam 02/24/2014  . Apical variant hypertrophic cardiomyopathy ([place]) 02/24/2014  . Tobacco abuse 02/24/2014  . Essential hypertension 12/12/2008  . CHEST PAIN-UNSPECIFIED 12/12/2008  . ABNORMAL EKG 12/12/2008   Guadelupe Sabin, OTR/L  [phone] 09/16/2016, 2:29 PM  [facility] [address] [place], [place], [address] Phone: [phone]   Fax:  [phone]  Name: [name] Foster MRN: [id number] Date of Birth: 1956/06/12

## 2016-09-19 ENCOUNTER — Ambulatory Visit (HOSPITAL_COMMUNITY): Payer: Worker's Compensation | Admitting: Specialist

## 2016-09-19 DIAGNOSIS — M25612 Stiffness of left shoulder, not elsewhere classified: Secondary | ICD-10-CM

## 2016-09-19 DIAGNOSIS — M25512 Pain in left shoulder: Secondary | ICD-10-CM

## 2016-09-19 DIAGNOSIS — R29898 Other symptoms and signs involving the musculoskeletal system: Secondary | ICD-10-CM

## 2016-09-19 NOTE — Therapy (Signed)
[place], [place], [address] Phone: [phone]   Fax:  [phone]  Occupational Therapy Treatment  Patient Details  Name: [name] Foster MRN: [id number] Date of Birth: Mar 17, 1956 Referring Provider: Dr. Tania Ade  Encounter Date: 09/19/2016      OT End of Session - 09/19/16 1408    Visit Number 4   Number of Visits 12   Date for OT Re-Evaluation 10/12/16   Authorization Type BCBS/Worker's Comp   Authorization Time Period Worker's Comp has not contacted [facility] as of 11/13. BCBS visit limit is 30   Authorization - Visit Number 4   Authorization - Number of Visits 30   OT Start Time [id number]   OT Stop Time 1343   OT Time Calculation (min) 38 min   Activity Tolerance Patient tolerated treatment well   Behavior During Therapy WFL for tasks assessed/performed      Past Medical History:  Diagnosis Date  . Abnormal EKG   . Apical variant hypertrophic cardiomyopathy ([place])   . Benign essential hypertension   . BPH (benign prostatic hyperplasia)   . Hypokalemia   . Tobacco abuse     Past Surgical History:  Procedure Laterality Date  . CARDIAC CATHETERIZATION     2008  . COLONOSCOPY N/A 02/26/2015   Procedure: COLONOSCOPY;  Surgeon: Rogene Houston, MD;  [facility];  Service: Endoscopy;  Laterality: N/A;  830  . KIDNEY SURGERY Right April 2015   benign tumor removal  . PROSTATE ABLATION  12-2015 at [facility]  . SHOULDER ACROMIOPLASTY Left 01/18/2016   Procedure: SHOULDER ACROMIOPLASTY;  Surgeon: Milly Jakob, MD;  Location: [facility];  Service: Orthopedics;  Laterality: Left;  . SHOULDER ARTHROSCOPY WITH ROTATOR CUFF REPAIR AND SUBACROMIAL DECOMPRESSION Left 01/18/2016   Procedure: LEFT SHOULDER ARTHROSCOPY WITH ROTATOR CUFF REPAIR AND SUBACROMIAL DECOMPRESSION;  Surgeon: Milly Jakob, MD;  Location: [facility];  Service: Orthopedics;  Laterality: Left;  PRE-OP  BLOCK WITH GENERAL ANESTHESIA    There were no vitals filed for this visit.      Subjective Assessment - 09/19/16 1305    Subjective  S:  If I bend my elbow and lay my arm on my chest, it feels better, otherwise it hurts   Currently in Pain? Yes   Pain Score 5    Pain Location Shoulder   Pain Orientation Left   Pain Descriptors / Indicators Aching   Pain Type Acute pain            [facility] OT Assessment - 09/19/16 0001      Assessment   Diagnosis Left capsular release s/p Left RCR     Precautions   Precautions None                  OT Treatments/Exercises (OP) - 09/19/16 0001      Exercises   Exercises Shoulder     Shoulder Exercises: Supine   Protraction PROM;AAROM;10 reps   Horizontal ABduction PROM;AAROM;10 reps   External Rotation PROM;AAROM;10 reps   Internal Rotation PROM;AAROM;10 reps   Flexion PROM;AAROM;10 reps   ABduction PROM;AAROM;10 reps     Shoulder Exercises: Seated   Other Seated Exercises PVC slide into flexion 10 times and into abduction 10 times with max pa to initiate movement   Other Seated Exercises seated short arm A/ROM shoulder flexion and abduction 0-30 with elbow flexed to 90 10 times each     Shoulder Exercises: Sidelying  External Rotation AROM;10 reps   External Rotation Limitations to neutral   Other Sidelying Exercises closed chain retraction/protraction 10 times.       Shoulder Exercises: Standing   Extension Theraband;10 reps   Theraband Level (Shoulder Extension) Level 2 (Red)   Row Theraband;10 reps   Theraband Level (Shoulder Row) Level 2 (Red)   Retraction Theraband;10 reps;Limitations   Theraband Level (Shoulder Retraction) Level 2 (Red)     Shoulder Exercises: ROM/Strengthening   UBE (Upper Arm Bike) 1 minute forward and 1 minute reverse at 1.0   Other ROM/Strengthening Exercises rolling foam roller forward and back by walking hands out on roller 10 times     Shoulder Exercises: Isometric Strengthening    Flexion Supine;5X5"   Extension Supine;5X5"   External Rotation Supine;5X5"   Internal Rotation Supine;5X5"   ABduction Supine;5X5"   ADduction Supine;5X5"     Manual Therapy   Manual Therapy Myofascial release   Manual therapy comments performed separately from all other skilled intervetnions    Myofascial Release myofascial release and manual stretching to left upper arm, scapular, and shoulder regions to decrease pain and restrictions and improve pain free mobility.                    OT Short Term Goals - 09/14/16 1211      OT SHORT TERM GOAL #1   Title Pt will be educated on HEP.    Time 4   Period Weeks   Status On-going     OT SHORT TERM GOAL #2   Title Pt will decrease pain in LUE to 4/10 or less to improve ability to complete daily tasks.   Time 4   Period Weeks   Status On-going     OT SHORT TERM GOAL #3   Title Pt will decrease LUE fascial restrictions to min amounts to improve mobility in LUE during functional tasks.    Time 4   Period Weeks   Status On-going     OT SHORT TERM GOAL #4   Title Pt will improve A/ROM of LUE by 50% to improve ability to use LUE during ADL completion.    Time 4   Period Weeks   Status On-going     OT SHORT TERM GOAL #5   Title Pt will improve LUE strength to 3/5 to increase ability to use LUE as assist during ADL and work tasks.   Time 4   Period Weeks   Status On-going                  Plan - 09/19/16 1408    Clinical Impression Statement A:  patient has max difficulty/unable to initiate flexion and and abduction.  once assist is given to begin movement, he is able to complete with active assist.  all AA/ROM in supine completed with unweighting and assist by therapist vs dowel rod.     Plan P:  Increase independence with initiation of active movement of left shoulder.  therapist to provide less facilitation with unweighting during AA/ROM exercises.      Patient will benefit from skilled therapeutic  intervention in order to improve the following deficits and impairments:     Visit Diagnosis: Stiffness of left shoulder, not elsewhere classified  Acute pain of left shoulder  Other symptoms and signs involving the musculoskeletal system    Problem List Patient Active Problem List   Diagnosis Date Noted  . Normal coronary arteries 2008 09/02/2016  . H/O renal  cell cancer 09/02/2016  . Hypokalemia   . Preop cardiovascular exam 02/24/2014  . Apical variant hypertrophic cardiomyopathy ([place]) 02/24/2014  . Tobacco abuse 02/24/2014  . Essential hypertension 12/12/2008  . CHEST PAIN-UNSPECIFIED 12/12/2008  . ABNORMAL EKG 12/12/2008    Vangie Bicker, [place], OTR/L [phone]  09/19/2016, 2:19 PM  [facility] [place], [place], [address] Phone: [phone]   Fax:  [phone]  Name: [name] Foster MRN: [id number] Date of Birth: 06/27/1956

## 2016-09-21 ENCOUNTER — Ambulatory Visit (HOSPITAL_COMMUNITY): Payer: Worker's Compensation | Admitting: Specialist

## 2016-09-21 DIAGNOSIS — M25612 Stiffness of left shoulder, not elsewhere classified: Secondary | ICD-10-CM | POA: Diagnosis not present

## 2016-09-21 DIAGNOSIS — R29898 Other symptoms and signs involving the musculoskeletal system: Secondary | ICD-10-CM

## 2016-09-21 DIAGNOSIS — M25512 Pain in left shoulder: Secondary | ICD-10-CM

## 2016-09-21 NOTE — Therapy (Signed)
[place], [place], [address] Phone: [phone]   Fax:  [phone]  Occupational Therapy Treatment  Patient Details  Name: [name] Foster MRN: [id number] Date of Birth: 01/25/1956 Referring Provider: Dr. Tania Ade  Encounter Date: 09/21/2016      OT End of Session - 09/21/16 1500    Visit Number 5   Number of Visits 12   Date for OT Re-Evaluation 10/12/16   Authorization Time Period Worker's Comp has not contacted [facility] as of 11/13. BCBS visit limit is 30   Authorization - Visit Number 5   Authorization - Number of Visits 30   OT Start Time 1306   OT Stop Time 1349   OT Time Calculation (min) 43 min   Activity Tolerance Patient tolerated treatment well   Behavior During Therapy WFL for tasks assessed/performed      Past Medical History:  Diagnosis Date  . Abnormal EKG   . Apical variant hypertrophic cardiomyopathy ([place])   . Benign essential hypertension   . BPH (benign prostatic hyperplasia)   . Hypokalemia   . Tobacco abuse     Past Surgical History:  Procedure Laterality Date  . CARDIAC CATHETERIZATION     2008  . COLONOSCOPY N/A 02/26/2015   Procedure: COLONOSCOPY;  Surgeon: Rogene Houston, MD;  [facility];  Service: Endoscopy;  Laterality: N/A;  830  . KIDNEY SURGERY Right April 2015   benign tumor removal  . PROSTATE ABLATION  12-2015 at [facility]  . SHOULDER ACROMIOPLASTY Left 01/18/2016   Procedure: SHOULDER ACROMIOPLASTY;  Surgeon: Milly Jakob, MD;  Location: [facility];  Service: Orthopedics;  Laterality: Left;  . SHOULDER ARTHROSCOPY WITH ROTATOR CUFF REPAIR AND SUBACROMIAL DECOMPRESSION Left 01/18/2016   Procedure: LEFT SHOULDER ARTHROSCOPY WITH ROTATOR CUFF REPAIR AND SUBACROMIAL DECOMPRESSION;  Surgeon: Milly Jakob, MD;  Location: [facility];  Service: Orthopedics;  Laterality: Left;  PRE-OP BLOCK WITH GENERAL ANESTHESIA    There  were no vitals filed for this visit.      Subjective Assessment - 09/21/16 1456    Subjective  S:  I had a headache last night from my shoulder.     Pain Score 5    Pain Location Shoulder   Pain Orientation Left   Pain Descriptors / Indicators Aching   Pain Type Acute pain            [facility] OT Assessment - 09/21/16 0001      Assessment   Diagnosis Left capsular release s/p Left RCR     Precautions   Precautions None                  OT Treatments/Exercises (OP) - 09/21/16 0001      Exercises   Exercises Shoulder     Shoulder Exercises: Supine   Protraction PROM;AAROM;10 reps   Horizontal ABduction PROM;AAROM;10 reps   External Rotation PROM;AAROM;10 reps   Internal Rotation PROM;AAROM;10 reps   Flexion PROM;AAROM;10 reps   ABduction PROM;AAROM;10 reps     Shoulder Exercises: Sidelying   External Rotation AROM;10 reps   External Rotation Limitations to neutral   ABduction 10 reps;AAROM   ABduction Limitations with elbow flexed to 90, abducted to 90 with facilitation from therapist   Other Sidelying Exercises closed chain retraction/protraction 10 times.       Shoulder Exercises: ROM/Strengthening   UBE (Upper Arm Bike) 1 minute forward and 1 minute reverse at 1.0  Other ROM/Strengthening Exercises rolling foam roller forward and back by walking hands out on roller 10 times     Shoulder Exercises: Isometric Strengthening   Flexion Supine;5X5"   Extension Supine;5X5"   External Rotation Supine;5X5"   Internal Rotation Supine;5X5"   ABduction Supine;5X5"   ADduction Supine;5X5"     Manual Therapy   Manual Therapy Myofascial release   Manual therapy comments performed separately from all other skilled intervetnions    Myofascial Release myofascial release and manual stretching to left upper arm, scapular, and shoulder regions to decrease pain and restrictions and improve pain free mobility.  Added manual cervical traction and myofascial release to  cervical region this date.                 OT Education - 09/21/16 1459    Education provided Yes   Education Details instructed patient to discontinue dowel rod exercises for the time being as they are causing sharp pain in his right shoulder with positive clunking and clicking.   Person(s) Educated Patient   Methods Explanation   Comprehension Verbalized understanding          OT Short Term Goals - 09/14/16 1211      OT SHORT TERM GOAL #1   Title Pt will be educated on HEP.    Time 4   Period Weeks   Status On-going     OT SHORT TERM GOAL #2   Title Pt will decrease pain in LUE to 4/10 or less to improve ability to complete daily tasks.   Time 4   Period Weeks   Status On-going     OT SHORT TERM GOAL #3   Title Pt will decrease LUE fascial restrictions to min amounts to improve mobility in LUE during functional tasks.    Time 4   Period Weeks   Status On-going     OT SHORT TERM GOAL #4   Title Pt will improve A/ROM of LUE by 50% to improve ability to use LUE during ADL completion.    Time 4   Period Weeks   Status On-going     OT SHORT TERM GOAL #5   Title Pt will improve LUE strength to 3/5 to increase ability to use LUE as assist during ADL and work tasks.   Time 4   Period Weeks   Status On-going                  Plan - 09/21/16 1500    Clinical Impression Statement A:  Patient continues to have difficulty initiating movement of shoulder, with unweighting, patient demonstrated incerased control than at last session.    Plan P:  Continue to improve left shoulder function needed for daily tasks.  improve scapular stability for improved shoulder alignment and less pain.    OT Home Exercise Plan 11/13: table slides 09/14/16:  elevation and row.  09/21/16:  discontinue dowel rod exercises.      Patient will benefit from skilled therapeutic intervention in order to improve the following deficits and impairments:  Decreased activity tolerance,  Decreased strength, Impaired flexibility, Decreased range of motion, Pain, Increased fascial restricitons, Impaired UE functional use  Visit Diagnosis: Stiffness of left shoulder, not elsewhere classified  Acute pain of left shoulder  Other symptoms and signs involving the musculoskeletal system    Problem List Patient Active Problem List   Diagnosis Date Noted  . Normal coronary arteries 2008 09/02/2016  . H/O renal cell cancer 09/02/2016  . Hypokalemia   .  Preop cardiovascular exam 02/24/2014  . Apical variant hypertrophic cardiomyopathy ([place]) 02/24/2014  . Tobacco abuse 02/24/2014  . Essential hypertension 12/12/2008  . CHEST PAIN-UNSPECIFIED 12/12/2008  . ABNORMAL EKG 12/12/2008    Vangie Bicker, [place], OTR/L [phone]  09/21/2016, 3:03 PM  [facility], [place], [address] Phone: [phone]   Fax:  [phone]  Name: [name] Foster MRN: [id number] Date of Birth: 1956-09-26

## 2016-09-26 ENCOUNTER — Ambulatory Visit (HOSPITAL_COMMUNITY): Payer: Worker's Compensation | Admitting: Specialist

## 2016-09-26 DIAGNOSIS — M25612 Stiffness of left shoulder, not elsewhere classified: Secondary | ICD-10-CM | POA: Diagnosis not present

## 2016-09-26 DIAGNOSIS — M25512 Pain in left shoulder: Secondary | ICD-10-CM

## 2016-09-26 DIAGNOSIS — R29898 Other symptoms and signs involving the musculoskeletal system: Secondary | ICD-10-CM

## 2016-09-26 NOTE — Therapy (Signed)
[place], [place], [address] Phone: [phone]   Fax:  [phone]  Occupational Therapy Treatment  Patient Details  Name: [name] Foster MRN: [id number] Date of Birth: December 27, 1955 Referring Provider: Dr. Tania Ade  Encounter Date: 09/26/2016      OT End of Session - 09/26/16 1447    Visit Number 6   Number of Visits 12   Date for OT Re-Evaluation 10/12/16   Authorization Type BCBS/Worker's Comp   Authorization Time Period Worker's Comp has not contacted [facility] as of 11/13. BCBS visit limit is 30   Authorization - Visit Number 6   Authorization - Number of Visits 30   OT Start Time [id number]   OT Stop Time 1345   OT Time Calculation (min) 41 min   Activity Tolerance Patient tolerated treatment well   Behavior During Therapy WFL for tasks assessed/performed      Past Medical History:  Diagnosis Date  . Abnormal EKG   . Apical variant hypertrophic cardiomyopathy ([place])   . Benign essential hypertension   . BPH (benign prostatic hyperplasia)   . Hypokalemia   . Tobacco abuse     Past Surgical History:  Procedure Laterality Date  . CARDIAC CATHETERIZATION     2008  . COLONOSCOPY N/A 02/26/2015   Procedure: COLONOSCOPY;  Surgeon: Rogene Houston, MD;  [facility];  Service: Endoscopy;  Laterality: N/A;  830  . KIDNEY SURGERY Right April 2015   benign tumor removal  . PROSTATE ABLATION  12-2015 at [facility]  . SHOULDER ACROMIOPLASTY Left 01/18/2016   Procedure: SHOULDER ACROMIOPLASTY;  Surgeon: Milly Jakob, MD;  Location: [facility];  Service: Orthopedics;  Laterality: Left;  . SHOULDER ARTHROSCOPY WITH ROTATOR CUFF REPAIR AND SUBACROMIAL DECOMPRESSION Left 01/18/2016   Procedure: LEFT SHOULDER ARTHROSCOPY WITH ROTATOR CUFF REPAIR AND SUBACROMIAL DECOMPRESSION;  Surgeon: Milly Jakob, MD;  Location: [facility];  Service: Orthopedics;  Laterality: Left;  PRE-OP  BLOCK WITH GENERAL ANESTHESIA    There were no vitals filed for this visit.      Subjective Assessment - 09/26/16 1444    Subjective  S:  My head was hurting again, I can pinpoint it to my shoulder   Currently in Pain? Yes   Pain Score 7    Pain Location Shoulder   Pain Orientation Left   Pain Descriptors / Indicators Aching   Pain Type Acute pain            [facility] OT Assessment - 09/26/16 0001      Assessment   Diagnosis Left capsular release s/p Left RCR     Precautions   Precautions None                  OT Treatments/Exercises (OP) - 09/26/16 0001      Exercises   Exercises Shoulder     Shoulder Exercises: Supine   Protraction PROM;AAROM;10 reps   External Rotation PROM;AAROM;10 reps   Internal Rotation PROM;AAROM;10 reps   Flexion PROM;AAROM;10 reps   ABduction PROM;AAROM;10 reps     Shoulder Exercises: Seated   Elevation AROM;10 reps     Shoulder Exercises: Sidelying   External Rotation AROM;10 reps   External Rotation Limitations to neutral   Other Sidelying Exercises closed chain retraction/protraction 10 times.       Shoulder Exercises: Standing   Extension Theraband;10 reps   Theraband Level (Shoulder Extension) Level 2 (Red)   Row Theraband;10 reps  Theraband Level (Shoulder Row) Level 2 (Red)     Shoulder Exercises: ROM/Strengthening   UBE (Upper Arm Bike) 3' forward and 3' reverse at 1.0     Manual Therapy   Manual Therapy Myofascial release   Manual therapy comments performed separately from all other skilled intervetnions    Myofascial Release myofascial release and manual stretching to left upper arm, scapular, and shoulder regions to decrease pain and restrictions and improve pain free mobility.  Added manual cervical traction and myofascial release to cervical region this date.                   OT Short Term Goals - 09/14/16 1211      OT SHORT TERM GOAL #1   Title Pt will be educated on HEP.    Time 4    Period Weeks   Status On-going     OT SHORT TERM GOAL #2   Title Pt will decrease pain in LUE to 4/10 or less to improve ability to complete daily tasks.   Time 4   Period Weeks   Status On-going     OT SHORT TERM GOAL #3   Title Pt will decrease LUE fascial restrictions to min amounts to improve mobility in LUE during functional tasks.    Time 4   Period Weeks   Status On-going     OT SHORT TERM GOAL #4   Title Pt will improve A/ROM of LUE by 50% to improve ability to use LUE during ADL completion.    Time 4   Period Weeks   Status On-going     OT SHORT TERM GOAL #5   Title Pt will improve LUE strength to 3/5 to increase ability to use LUE as assist during ADL and work tasks.   Time 4   Period Weeks   Status On-going                  Plan - 09/26/16 1447    Clinical Impression Statement A:  Patient continues to have difficulty initiating shoulder moveemnt with unweighting and closed chaining, patient able to move shoulder through 40% flexion and abduction in supine and sidelying.  Audible popping with any ranging of shoulder present.    Plan  P:  Continue to improve left shoulder function needed for daily tasks. improve scapular stability for improved shoulder alignment and less pain. taken 5 days ago    Consulted and Agree with Plan of Care Patient      Patient will benefit from skilled therapeutic intervention in order to improve the following deficits and impairments:  Decreased activity tolerance, Decreased strength, Impaired flexibility, Decreased range of motion, Pain, Increased fascial restricitons, Impaired UE functional use  Visit Diagnosis: Stiffness of left shoulder, not elsewhere classified  Acute pain of left shoulder  Other symptoms and signs involving the musculoskeletal system    Problem List Patient Active Problem List   Diagnosis Date Noted  . Normal coronary arteries 2008 09/02/2016  . H/O renal cell cancer 09/02/2016  . Hypokalemia    . Preop cardiovascular exam 02/24/2014  . Apical variant hypertrophic cardiomyopathy ([place]) 02/24/2014  . Tobacco abuse 02/24/2014  . Essential hypertension 12/12/2008  . CHEST PAIN-UNSPECIFIED 12/12/2008  . ABNORMAL EKG 12/12/2008    Vangie Bicker, [place], OTR/L [phone]  09/26/2016, 2:50 PM  [place], [place], [address] Phone: [phone]   Fax:  [phone]  Name: Alexxander Brindle MRN: [id number]  Date of Birth: 1956/03/28

## 2016-09-28 ENCOUNTER — Encounter (HOSPITAL_COMMUNITY): Payer: Self-pay

## 2016-09-28 ENCOUNTER — Ambulatory Visit (HOSPITAL_COMMUNITY): Payer: Worker's Compensation

## 2016-09-28 DIAGNOSIS — M25612 Stiffness of left shoulder, not elsewhere classified: Secondary | ICD-10-CM | POA: Diagnosis not present

## 2016-09-28 DIAGNOSIS — M25512 Pain in left shoulder: Secondary | ICD-10-CM

## 2016-09-28 DIAGNOSIS — R29898 Other symptoms and signs involving the musculoskeletal system: Secondary | ICD-10-CM

## 2016-09-28 NOTE — Therapy (Signed)
[place], [place], [address] Phone: [phone]   Fax:  [phone]  Occupational Therapy Treatment  Patient Details  Name: [name] Foster MRN: [id number] Date of Birth: Sep 25, 1956 Referring Provider: Dr. Tania Ade  Encounter Date: 09/28/2016      OT End of Session - 09/28/16 1352    Visit Number 7   Number of Visits 12   Date for OT Re-Evaluation 10/12/16   Authorization Type BCBS/Worker's Comp   Authorization Time Period Worker's Comp has not contacted [facility] as of 11/13. BCBS visit limit is 30   Authorization - Visit Number 7   Authorization - Number of Visits 30   OT Start Time [id number]   OT Stop Time 1346   OT Time Calculation (min) 41 min   Activity Tolerance Patient tolerated treatment well   Behavior During Therapy WFL for tasks assessed/performed      Past Medical History:  Diagnosis Date  . Abnormal EKG   . Apical variant hypertrophic cardiomyopathy ([place])   . Benign essential hypertension   . BPH (benign prostatic hyperplasia)   . Hypokalemia   . Tobacco abuse     Past Surgical History:  Procedure Laterality Date  . CARDIAC CATHETERIZATION     2008  . COLONOSCOPY N/A 02/26/2015   Procedure: COLONOSCOPY;  Surgeon: Rogene Houston, MD;  [facility];  Service: Endoscopy;  Laterality: N/A;  830  . KIDNEY SURGERY Right April 2015   benign tumor removal  . PROSTATE ABLATION  12-2015 at [facility]  . SHOULDER ACROMIOPLASTY Left 01/18/2016   Procedure: SHOULDER ACROMIOPLASTY;  Surgeon: Milly Jakob, MD;  Location: [facility];  Service: Orthopedics;  Laterality: Left;  . SHOULDER ARTHROSCOPY WITH ROTATOR CUFF REPAIR AND SUBACROMIAL DECOMPRESSION Left 01/18/2016   Procedure: LEFT SHOULDER ARTHROSCOPY WITH ROTATOR CUFF REPAIR AND SUBACROMIAL DECOMPRESSION;  Surgeon: Milly Jakob, MD;  Location: [facility];  Service: Orthopedics;  Laterality: Left;  PRE-OP  BLOCK WITH GENERAL ANESTHESIA    There were no vitals filed for this visit.          [facility] OT Assessment - 09/28/16 1351      Assessment   Diagnosis Left capsular release s/p Left RCR     Precautions   Precautions None                  OT Treatments/Exercises (OP) - 09/28/16 1337      Exercises   Exercises Shoulder     Shoulder Exercises: Supine   Protraction PROM;AAROM;10 reps   Horizontal ABduction PROM;AAROM;10 reps   External Rotation PROM;AAROM;15 reps   Internal Rotation PROM;AAROM;15 reps   Flexion PROM;AAROM;10 reps   ABduction PROM;AAROM;10 reps     Shoulder Exercises: Standing   Protraction AAROM;10 reps   External Rotation AAROM;10 reps   Internal Rotation AAROM;10 reps   Flexion AAROM;10 reps     Shoulder Exercises: ROM/Strengthening   Other ROM/Strengthening Exercises Table wash seated 1'     Manual Therapy   Manual Therapy Myofascial release;Muscle Energy Technique   Manual therapy comments performed separately from all other skilled intervetnions    Myofascial Release myofascial release and manual stretching to left upper arm, scapular, and shoulder regions to decrease pain and restrictions and improve pain free mobility.  Added manual cervical traction and myofascial release to cervical region this date.    Muscle Energy Technique Muscle energy technique completed to left anterior deltoid  to relax tone  and muscle spasm and improve range of motion.                   OT Short Term Goals - 09/14/16 1211      OT SHORT TERM GOAL #1   Title Pt will be educated on HEP.    Time 4   Period Weeks   Status On-going     OT SHORT TERM GOAL #2   Title Pt will decrease pain in LUE to 4/10 or less to improve ability to complete daily tasks.   Time 4   Period Weeks   Status On-going     OT SHORT TERM GOAL #3   Title Pt will decrease LUE fascial restrictions to min amounts to improve mobility in LUE during functional tasks.    Time  4   Period Weeks   Status On-going     OT SHORT TERM GOAL #4   Title Pt will improve A/ROM of LUE by 50% to improve ability to use LUE during ADL completion.    Time 4   Period Weeks   Status On-going     OT SHORT TERM GOAL #5   Title Pt will improve LUE strength to 3/5 to increase ability to use LUE as assist during ADL and work tasks.   Time 4   Period Weeks   Status On-going                  Plan - 09/28/16 1352    Clinical Impression Statement A: Pt with max limitations with activating rotator cuff to complete shoulder ranges. patient relies heavily on trapezius muscle for shoulder movement. Pt reports that last night he experienced increased tightness in his upper trapezius and the pain traveled up his neck into his eye.    Plan P: Take measurements for MD appointment.      Patient will benefit from skilled therapeutic intervention in order to improve the following deficits and impairments:  Decreased activity tolerance, Decreased strength, Impaired flexibility, Decreased range of motion, Pain, Increased fascial restricitons, Impaired UE functional use  Visit Diagnosis: Stiffness of left shoulder, not elsewhere classified  Acute pain of left shoulder  Other symptoms and signs involving the musculoskeletal system    Problem List Patient Active Problem List   Diagnosis Date Noted  . Normal coronary arteries 2008 09/02/2016  . H/O renal cell cancer 09/02/2016  . Hypokalemia   . Preop cardiovascular exam 02/24/2014  . Apical variant hypertrophic cardiomyopathy ([place]) 02/24/2014  . Tobacco abuse 02/24/2014  . Essential hypertension 12/12/2008  . CHEST PAIN-UNSPECIFIED 12/12/2008  . ABNORMAL EKG 12/12/2008   Ailene Ravel, OTR/L,CBIS  [phone]  09/28/2016, 2:56 PM  [place] [address] [place], [place], [address] Phone: [phone]   Fax:  [phone]  Name: [name] Foster MRN: [id number] Date of  Birth: July 17, 1956

## 2016-09-30 ENCOUNTER — Ambulatory Visit (HOSPITAL_COMMUNITY): Payer: Worker's Compensation | Attending: Orthopedic Surgery

## 2016-09-30 DIAGNOSIS — R29898 Other symptoms and signs involving the musculoskeletal system: Secondary | ICD-10-CM | POA: Insufficient documentation

## 2016-09-30 DIAGNOSIS — M25612 Stiffness of left shoulder, not elsewhere classified: Secondary | ICD-10-CM | POA: Diagnosis present

## 2016-09-30 DIAGNOSIS — M25512 Pain in left shoulder: Secondary | ICD-10-CM | POA: Diagnosis present

## 2016-09-30 NOTE — Therapy (Signed)
[place], [place], [address] Phone: [phone]   Fax:  [phone]  Occupational Therapy Treatment And reassessment Patient Details  Name: [name] Foster MRN: [id number] Date of Birth: 01/08/1956 Referring Provider: Dr. Tania Ade  Encounter Date: 09/30/2016      OT End of Session - 09/30/16 1514    Visit Number 8   Number of Visits 12   Date for OT Re-Evaluation 10/12/16   Authorization Type BCBS/Worker's Comp   Authorization Time Period Worker's Comp has not contacted [facility] as of 11/13. BCBS visit limit is 30   Authorization - Visit Number 8   Authorization - Number of Visits 30   OT Start Time [id number]   OT Stop Time 1345   OT Time Calculation (min) 40 min   Activity Tolerance Patient tolerated treatment well   Behavior During Therapy WFL for tasks assessed/performed      Past Medical History:  Diagnosis Date  . Abnormal EKG   . Apical variant hypertrophic cardiomyopathy ([place])   . Benign essential hypertension   . BPH (benign prostatic hyperplasia)   . Hypokalemia   . Tobacco abuse     Past Surgical History:  Procedure Laterality Date  . CARDIAC CATHETERIZATION     2008  . COLONOSCOPY N/A 02/26/2015   Procedure: COLONOSCOPY;  Surgeon: Rogene Houston, MD;  [facility];  Service: Endoscopy;  Laterality: N/A;  830  . KIDNEY SURGERY Right April 2015   benign tumor removal  . PROSTATE ABLATION  12-2015 at [facility]  . SHOULDER ACROMIOPLASTY Left 01/18/2016   Procedure: SHOULDER ACROMIOPLASTY;  Surgeon: Milly Jakob, MD;  Location: [facility];  Service: Orthopedics;  Laterality: Left;  . SHOULDER ARTHROSCOPY WITH ROTATOR CUFF REPAIR AND SUBACROMIAL DECOMPRESSION Left 01/18/2016   Procedure: LEFT SHOULDER ARTHROSCOPY WITH ROTATOR CUFF REPAIR AND SUBACROMIAL DECOMPRESSION;  Surgeon: Milly Jakob, MD;  Location: [facility];  Service: Orthopedics;  Laterality:  Left;  PRE-OP BLOCK WITH GENERAL ANESTHESIA    There were no vitals filed for this visit.      Subjective Assessment - 09/30/16 1514    Subjective  S: lifting out to the side has gotten a little better but not in front.   Currently in Pain? Yes   Pain Score 7    Pain Location Shoulder   Pain Orientation Left   Pain Descriptors / Indicators Aching   Pain Type Acute pain            [facility] OT Assessment - 09/30/16 1307      Assessment   Diagnosis Left capsular release s/p Left RCR     Precautions   Precautions None     AROM   Overall AROM Comments Assessed seated, er/IR adducted   AROM Assessment Site Shoulder   Right/Left Shoulder Left   Left Shoulder Flexion 61 Degrees  previous: 22   Left Shoulder ABduction 61 Degrees  previous: 26   Left Shoulder Internal Rotation 90 Degrees  previous: same   Left Shoulder External Rotation 10 Degrees  previous: 0     PROM   Overall PROM Comments Assessed supine, er/IR adducted   PROM Assessment Site Shoulder   Right/Left Shoulder Left   Left Shoulder Flexion 107 Degrees  previous: 35   Left Shoulder ABduction 111 Degrees  previous: 61   Left Shoulder Internal Rotation 90 Degrees  previous: same   Left Shoulder External Rotation 45 Degrees  previous: 24  Strength   Overall Strength Comments Assessed seated, er/IR adducted   Strength Assessment Site Shoulder   Right/Left Shoulder Left   Left Shoulder Flexion 2-/5  previous: same   Left Shoulder ABduction 2-/5  previous: same   Left Shoulder Internal Rotation 3/5  previous: same   Left Shoulder External Rotation 2-/5  previous: same                  OT Treatments/Exercises (OP) - 09/30/16 1306      Exercises   Exercises Shoulder     Shoulder Exercises: Supine   Protraction PROM;AAROM;10 reps   External Rotation PROM;AAROM;15 reps   Internal Rotation PROM;AAROM;15 reps   Flexion PROM;AAROM;10 reps   ABduction PROM;AAROM;10 reps     Shoulder  Exercises: Standing   Protraction AAROM;10 reps   External Rotation AAROM;10 reps   Internal Rotation AAROM;10 reps   Flexion AAROM;10 reps   ABduction AAROM;10 reps   Extension Theraband;10 reps   Theraband Level (Shoulder Extension) Level 2 (Red)   Row Theraband;10 reps   Theraband Level (Shoulder Row) Level 2 (Red)   Other Standing Exercises PVC pipe slide 12X     Shoulder Exercises: ROM/Strengthening   Other ROM/Strengthening Exercises Table wash seated 1'     Manual Therapy   Manual Therapy Myofascial release   Manual therapy comments performed separately from all other skilled intervetnions    Myofascial Release myofascial release and manual stretching to left upper arm, scapular, and shoulder regions to decrease pain and restrictions and improve pain free mobility.  Added manual cervical traction and myofascial release to cervical region this date.                   OT Short Term Goals - 09/30/16 1517      OT SHORT TERM GOAL #1   Title Pt will be educated on HEP.    Time 4   Period Weeks   Status Achieved     OT SHORT TERM GOAL #2   Title Pt will decrease pain in LUE to 4/10 or less to improve ability to complete daily tasks.   Time 4   Period Weeks   Status On-going     OT SHORT TERM GOAL #3   Title Pt will decrease LUE fascial restrictions to min amounts to improve mobility in LUE during functional tasks.    Time 4   Period Weeks   Status Achieved     OT SHORT TERM GOAL #4   Title Pt will improve A/ROM of LUE by 50% to improve ability to use LUE during ADL completion.    Time 4   Period Weeks   Status Partially Met     OT SHORT TERM GOAL #5   Title Pt will improve LUE strength to 3/5 to increase ability to use LUE as assist during ADL and work tasks.   Time 4   Period Weeks   Status On-going                  Plan - 09/30/16 1514    Clinical Impression Statement A: Reassessment completed this date as patient has a follow up visit  with MD scheduled for next week. patient has made slight progress with Active and Passive ROM although has not a significant amount. He continues to be limited with ROM, strength and increased pain with shoulder mobility. patient has severe difficulty with completing daily tasks using his LUE.    Plan P: Hold therapy  until patient's follow up visit with MD on Wednesday 10/05/16. pt will call us and let us know if he needs to schedule more appointments or be discharged.       Patient will benefit from skilled therapeutic intervention in order to improve the following deficits and impairments:  Decreased activity tolerance, Decreased strength, Impaired flexibility, Decreased range of motion, Pain, Increased fascial restricitons, Impaired UE functional use  Visit Diagnosis: Stiffness of left shoulder, not elsewhere classified  Acute pain of left shoulder  Other symptoms and signs involving the musculoskeletal system    Problem List Patient Active Problem List   Diagnosis Date Noted  . Normal coronary arteries 2008 09/02/2016  . H/O renal cell cancer 09/02/2016  . Hypokalemia   . Preop cardiovascular exam 02/24/2014  . Apical variant hypertrophic cardiomyopathy ([place]) 02/24/2014  . Tobacco abuse 02/24/2014  . Essential hypertension 12/12/2008  . CHEST PAIN-UNSPECIFIED 12/12/2008  . ABNORMAL EKG 12/12/2008   Ailene Ravel, OTR/L,CBIS  [phone]  09/30/2016, 3:18 PM  [facility] [address] [place], [place], [address] Phone: [phone]   Fax:  [phone]  Name: Rakan Soffer MRN: [id number] Date of Birth: 1956/07/08

## 2016-10-10 ENCOUNTER — Telehealth (HOSPITAL_COMMUNITY): Payer: Self-pay

## 2016-10-10 NOTE — Telephone Encounter (Signed)
Called pt to schedule, no voice mail set up could not leave a message.Patient has been approved for 12 more visit per [facility] [employer] per Tobey Bride, RN, BSN, [facility] 10/10/2016

## 2016-10-12 ENCOUNTER — Encounter (HOSPITAL_COMMUNITY): Payer: Self-pay | Admitting: Occupational Therapy

## 2016-10-12 ENCOUNTER — Ambulatory Visit (HOSPITAL_COMMUNITY): Payer: Worker's Compensation | Admitting: Occupational Therapy

## 2016-10-12 DIAGNOSIS — R29898 Other symptoms and signs involving the musculoskeletal system: Secondary | ICD-10-CM

## 2016-10-12 DIAGNOSIS — M25612 Stiffness of left shoulder, not elsewhere classified: Secondary | ICD-10-CM

## 2016-10-12 DIAGNOSIS — M25512 Pain in left shoulder: Secondary | ICD-10-CM

## 2016-10-12 NOTE — Addendum Note (Signed)
Addended by: Guadelupe Sabin A on: 10/12/2016 04:15 PM   Modules accepted: Orders

## 2016-10-12 NOTE — Therapy (Addendum)
[place], [place], [address] Phone: [phone]   Fax:  [phone]  Occupational Therapy Treatment  Patient Details  Name: [name] Foster MRN: [id number] Date of Birth: [birth year] Referring Provider: Dr. Tania Ade  Encounter Date: 10/12/2016      OT End of Session - 10/12/16 1503    Visit Number 9   Number of Visits 12   Date for OT Re-Evaluation 11/09/16   Authorization Type BCBS/Worker's Comp   Authorization Time Period Worker's Comp has not contacted [facility] as of 11/13. BCBS visit limit is 30   Authorization - Visit Number 9   Authorization - Number of Visits 30   OT Start Time 1416   OT Stop Time 1502   OT Time Calculation (min) 46 min   Activity Tolerance Patient tolerated treatment well   Behavior During Therapy WFL for tasks assessed/performed      Past Medical History:  Diagnosis Date  . Abnormal EKG   . Apical variant hypertrophic cardiomyopathy ([place])   . Benign essential hypertension   . BPH (benign prostatic hyperplasia)   . Hypokalemia   . Tobacco abuse     Past Surgical History:  Procedure Laterality Date  . CARDIAC CATHETERIZATION     2008  . COLONOSCOPY N/A 02/26/2015   Procedure: COLONOSCOPY;  Surgeon: Rogene Houston, MD;  [facility];  Service: Endoscopy;  Laterality: N/A;  830  . KIDNEY SURGERY Right April 2015   benign tumor removal  . PROSTATE ABLATION  12-2015 at [facility]  . SHOULDER ACROMIOPLASTY Left 01/18/2016   Procedure: SHOULDER ACROMIOPLASTY;  Surgeon: Milly Jakob, MD;  Location: [place];  Service: Orthopedics;  Laterality: Left;  . SHOULDER ARTHROSCOPY WITH ROTATOR CUFF REPAIR AND SUBACROMIAL DECOMPRESSION Left 01/18/2016   Procedure: LEFT SHOULDER ARTHROSCOPY WITH ROTATOR CUFF REPAIR AND SUBACROMIAL DECOMPRESSION;  Surgeon: Milly Jakob, MD;  Location: [place];  Service: Orthopedics;  Laterality: Left;  PRE-OP  BLOCK WITH GENERAL ANESTHESIA    There were no vitals filed for this visit.      Subjective Assessment - 10/12/16 1414    Subjective  S: I saw the nurse and she said I needed more therapy.    Currently in Pain? Yes   Pain Score 5    Pain Location Shoulder   Pain Orientation Left   Pain Descriptors / Indicators Aching   Pain Type Acute pain   Pain Radiating Towards n/a   Pain Onset 1 to 4 weeks ago   Pain Frequency Intermittent   Aggravating Factors  movement, laying down to sleep   Pain Relieving Factors pain medication, rest, letting arm hang by side   Effect of Pain on Daily Activities unable to use LUE during daily tasks   Multiple Pain Sites No            [facility] OT Assessment - 10/12/16 1414      Assessment   Diagnosis Left capsular release s/p Left RCR     Precautions   Precautions None                  OT Treatments/Exercises (OP) - 10/12/16 1420      Exercises   Exercises Shoulder     Shoulder Exercises: Supine   Protraction PROM;5 reps;AAROM;12 reps   Horizontal ABduction PROM;5 reps;AAROM;12 reps   External Rotation PROM;5 reps;AAROM;15 reps   Internal Rotation PROM;5 reps;AAROM;15 reps   Flexion PROM;5 reps;AAROM;12 reps  ABduction PROM;5 reps;AAROM;12 reps     Shoulder Exercises: Prone   Retraction AROM;10 reps   Flexion AROM;10 reps   Extension AROM;10 reps   Horizontal ABduction 1 AROM;10 reps     Shoulder Exercises: Sidelying   External Rotation AROM;10 reps   External Rotation Limitations to neutral   Other Sidelying Exercises closed chain retraction/protraction 10 times.       Shoulder Exercises: Standing   Protraction AAROM;10 reps   External Rotation AAROM;10 reps   Internal Rotation AAROM;10 reps   Flexion AAROM;10 reps   ABduction AAROM;10 reps   Other Standing Exercises PVC pipe slide 12X     Shoulder Exercises: ROM/Strengthening   UBE (Upper Arm Bike) 3' forward and 3' reverse at 1.0     Manual Therapy   Manual  Therapy Myofascial release   Manual therapy comments performed separately from all other skilled intervetnions    Myofascial Release myofascial release and manual stretching to left upper arm, scapular, and shoulder regions to decrease pain and restrictions and improve pain free mobility.  Added manual cervical traction and myofascial release to cervical region this date.                   OT Short Term Goals - 09/30/16 1517      OT SHORT TERM GOAL #1   Title Pt will be educated on HEP.    Time 4   Period Weeks   Status Achieved     OT SHORT TERM GOAL #2   Title Pt will decrease pain in LUE to 4/10 or less to improve ability to complete daily tasks.   Time 4   Period Weeks   Status On-going     OT SHORT TERM GOAL #3   Title Pt will decrease LUE fascial restrictions to min amounts to improve mobility in LUE during functional tasks.    Time 4   Period Weeks   Status Achieved     OT SHORT TERM GOAL #4   Title Pt will improve A/ROM of LUE by 50% to improve ability to use LUE during ADL completion.    Time 4   Period Weeks   Status Partially Met     OT SHORT TERM GOAL #5   Title Pt will improve LUE strength to 3/5 to increase ability to use LUE as assist during ADL and work tasks.   Time 4   Period Weeks   Status On-going                  Plan - 10/12/16 1503    Clinical Impression Statement A: Pt reports he saw the nurse, not the MD, at his appt last week. They recommended continuing therapy until next appt in 1 month. Resumed AA/ROM in supine and standing, added A/ROM in prone, pt able to complete with 50% range and no increase in pain. Verbal cuing required for correct form.    Plan P: Continue with skilled therapy services 2-3x/week for 4 weeks focusing on strength and ROM working to decrease pain in LUE and improve functional task completion. Continue with prone exercises next session, attempt wall wash   OT Home Exercise Plan 11/13: table slides  09/14/16:  elevation and row.  09/21/16:  discontinue dowel rod exercises.   Consulted and Agree with Plan of Care Patient      Patient will benefit from skilled therapeutic intervention in order to improve the following deficits and impairments:  Decreased activity tolerance, Decreased strength, Impaired  flexibility, Decreased range of motion, Pain, Increased fascial restricitons, Impaired UE functional use  Visit Diagnosis: Stiffness of left shoulder, not elsewhere classified  Acute pain of left shoulder  Other symptoms and signs involving the musculoskeletal system    Problem List Patient Active Problem List   Diagnosis Date Noted  . Normal coronary arteries 2008 09/02/2016  . H/O renal cell cancer 09/02/2016  . Hypokalemia   . Preop cardiovascular exam 02/24/2014  . Apical variant hypertrophic cardiomyopathy ([place]) 02/24/2014  . Tobacco abuse 02/24/2014  . Essential hypertension 12/12/2008  . CHEST PAIN-UNSPECIFIED 12/12/2008  . ABNORMAL EKG 12/12/2008   Guadelupe Sabin, OTR/L  [phone] 10/12/2016, 4:12 PM  [facility] [address] [place], [place], [address] Phone: [phone]   Fax:  [phone]  Name: [name] Foster MRN: [id number] Date of Birth: [birth year]

## 2016-10-14 ENCOUNTER — Encounter (HOSPITAL_COMMUNITY): Payer: Self-pay | Admitting: Occupational Therapy

## 2016-10-14 ENCOUNTER — Ambulatory Visit (HOSPITAL_COMMUNITY): Payer: Worker's Compensation | Admitting: Occupational Therapy

## 2016-10-14 DIAGNOSIS — M25612 Stiffness of left shoulder, not elsewhere classified: Secondary | ICD-10-CM

## 2016-10-14 DIAGNOSIS — R29898 Other symptoms and signs involving the musculoskeletal system: Secondary | ICD-10-CM

## 2016-10-14 DIAGNOSIS — M25512 Pain in left shoulder: Secondary | ICD-10-CM

## 2016-10-14 NOTE — Therapy (Signed)
[place], [place], [address] Phone: [phone]   Fax:  [phone]  Occupational Therapy Treatment  Patient Details  Name: [name] Foster MRN: [id number] Date of Birth: 19-May-1956 Referring Provider: Dr. Tania Ade  Encounter Date: 10/14/2016      OT End of Session - 10/14/16 1207    Visit Number 10   Number of Visits 12   Date for OT Re-Evaluation 11/09/16   Authorization Type BCBS/Worker's Comp   Authorization Time Period Worker's Comp has not contacted [facility] as of 11/13. BCBS visit limit is 30   Authorization - Visit Number 10   Authorization - Number of Visits 30   OT Start Time 1115   OT Stop Time 1155   OT Time Calculation (min) 40 min   Activity Tolerance Patient tolerated treatment well   Behavior During Therapy WFL for tasks assessed/performed      Past Medical History:  Diagnosis Date  . Abnormal EKG   . Apical variant hypertrophic cardiomyopathy ([place])   . Benign essential hypertension   . BPH (benign prostatic hyperplasia)   . Hypokalemia   . Tobacco abuse     Past Surgical History:  Procedure Laterality Date  . CARDIAC CATHETERIZATION     2008  . COLONOSCOPY N/A 02/26/2015   Procedure: COLONOSCOPY;  Surgeon: Rogene Houston, MD;  [facility];  Service: Endoscopy;  Laterality: N/A;  830  . KIDNEY SURGERY Right April 2015   benign tumor removal  . PROSTATE ABLATION  12-2015 at [facility]  . SHOULDER ACROMIOPLASTY Left 01/18/2016   Procedure: SHOULDER ACROMIOPLASTY;  Surgeon: Milly Jakob, MD;  Location: [place];  Service: Orthopedics;  Laterality: Left;  . SHOULDER ARTHROSCOPY WITH ROTATOR CUFF REPAIR AND SUBACROMIAL DECOMPRESSION Left 01/18/2016   Procedure: LEFT SHOULDER ARTHROSCOPY WITH ROTATOR CUFF REPAIR AND SUBACROMIAL DECOMPRESSION;  Surgeon: Milly Jakob, MD;  Location: [facility];  Service: Orthopedics;  Laterality: Left;  PRE-OP  BLOCK WITH GENERAL ANESTHESIA    There were no vitals filed for this visit.      Subjective Assessment - 10/14/16 1115    Subjective  S: The cold makes it ache.    Currently in Pain? Yes   Pain Score 5    Pain Location Shoulder   Pain Orientation Left   Pain Descriptors / Indicators Aching   Pain Type Acute pain   Pain Radiating Towards n/a   Pain Onset 1 to 4 weeks ago   Pain Frequency Intermittent   Aggravating Factors  movement, cold   Pain Relieving Factors pain medications, creams, rest, letting arm hang by side   Effect of Pain on Daily Activities unable to use LUE during daily tasks   Multiple Pain Sites No            [facility] OT Assessment - 10/14/16 1115      Assessment   Diagnosis Left capsular release s/p Left RCR     Precautions   Precautions None                  OT Treatments/Exercises (OP) - 10/14/16 1117      Exercises   Exercises Shoulder     Shoulder Exercises: Supine   Protraction PROM;5 reps;AAROM;12 reps   Horizontal ABduction PROM;5 reps;AAROM;12 reps   External Rotation PROM;5 reps;AAROM;15 reps   Internal Rotation PROM;5 reps;AAROM;15 reps   Flexion PROM;5 reps;AAROM;12 reps   ABduction PROM;5 reps;AAROM;12 reps  Shoulder Exercises: Prone   Retraction AROM;10 reps   Flexion AROM;10 reps   Extension AROM;10 reps   Horizontal ABduction 1 AROM;10 reps     Shoulder Exercises: Standing   Protraction AAROM;10 reps   External Rotation AAROM;10 reps   Internal Rotation AAROM;10 reps   Flexion AAROM;10 reps   ABduction AAROM;10 reps   Extension Theraband;10 reps   Theraband Level (Shoulder Extension) Level 2 (Red)   Row Theraband;10 reps   Theraband Level (Shoulder Row) Level 2 (Red)     Shoulder Exercises: Pulleys   Flexion 1 minute   ABduction 1 minute     Shoulder Exercises: Therapy Ball   Right/Left 5 reps  each direction     Manual Therapy   Manual Therapy Myofascial release   Manual therapy comments performed  separately from all other skilled intervetnions    Myofascial Release myofascial release and manual stretching to left upper arm, scapular, and shoulder regions to decrease pain and restrictions and improve pain free mobility.  Added manual cervical traction and myofascial release to cervical region this date.                   OT Short Term Goals - 09/30/16 1517      OT SHORT TERM GOAL #1   Title Pt will be educated on HEP.    Time 4   Period Weeks   Status Achieved     OT SHORT TERM GOAL #2   Title Pt will decrease pain in LUE to 4/10 or less to improve ability to complete daily tasks.   Time 4   Period Weeks   Status On-going     OT SHORT TERM GOAL #3   Title Pt will decrease LUE fascial restrictions to min amounts to improve mobility in LUE during functional tasks.    Time 4   Period Weeks   Status Achieved     OT SHORT TERM GOAL #4   Title Pt will improve A/ROM of LUE by 50% to improve ability to use LUE during ADL completion.    Time 4   Period Weeks   Status Partially Met     OT SHORT TERM GOAL #5   Title Pt will improve LUE strength to 3/5 to increase ability to use LUE as assist during ADL and work tasks.   Time 4   Period Weeks   Status On-going                  Plan - 10/14/16 1208    Clinical Impression Statement A: Continued with prone A/ROM this session, added pulleys and therapy ball left/right. Pt continued to have increased pain at 50-75% range, trigger point palpated at upper trapezius. Pt requires verbal cuing for maintaining form and technqiue. Pt reports popping & crunching around shoulder region that continues into pectoral muscles at times.    Plan P: Attempt wall wash, continue working to increase range within pain tolerance, and working to improve strength necessary for functional task completion.    OT Home Exercise Plan 11/13: table slides 09/14/16:  elevation and row.  09/21/16:  discontinue dowel rod exercises.   Consulted  and Agree with Plan of Care Patient      Patient will benefit from skilled therapeutic intervention in order to improve the following deficits and impairments:  Decreased activity tolerance, Decreased strength, Impaired flexibility, Decreased range of motion, Pain, Increased fascial restricitons, Impaired UE functional use  Visit Diagnosis: Stiffness of left shoulder, not  elsewhere classified  Acute pain of left shoulder  Other symptoms and signs involving the musculoskeletal system    Problem List Patient Active Problem List   Diagnosis Date Noted  . Normal coronary arteries 2008 09/02/2016  . H/O renal cell cancer 09/02/2016  . Hypokalemia   . Preop cardiovascular exam 02/24/2014  . Apical variant hypertrophic cardiomyopathy ([place]) 02/24/2014  . Tobacco abuse 02/24/2014  . Essential hypertension 12/12/2008  . CHEST PAIN-UNSPECIFIED 12/12/2008  . ABNORMAL EKG 12/12/2008   Guadelupe Sabin, OTR/L  [phone] 10/14/2016, 12:11 PM  [place], [place], [address] Phone: [phone]   Fax:  [phone]  Name: Antero Derosia MRN: [id number] Date of Birth: [birth year]

## 2016-10-16 IMAGING — CR DG SHOULDER 2+V*L*
3 series · 3 of 3 positions shown · non-contrast
Comparison: Chest x-ray of 08/29/2012

CLINICAL DATA: Fell in a manhole injuring the left shoulder

EXAM:
LEFT SHOULDER - 2+ VIEW

[view not recorded (1 of 3)]
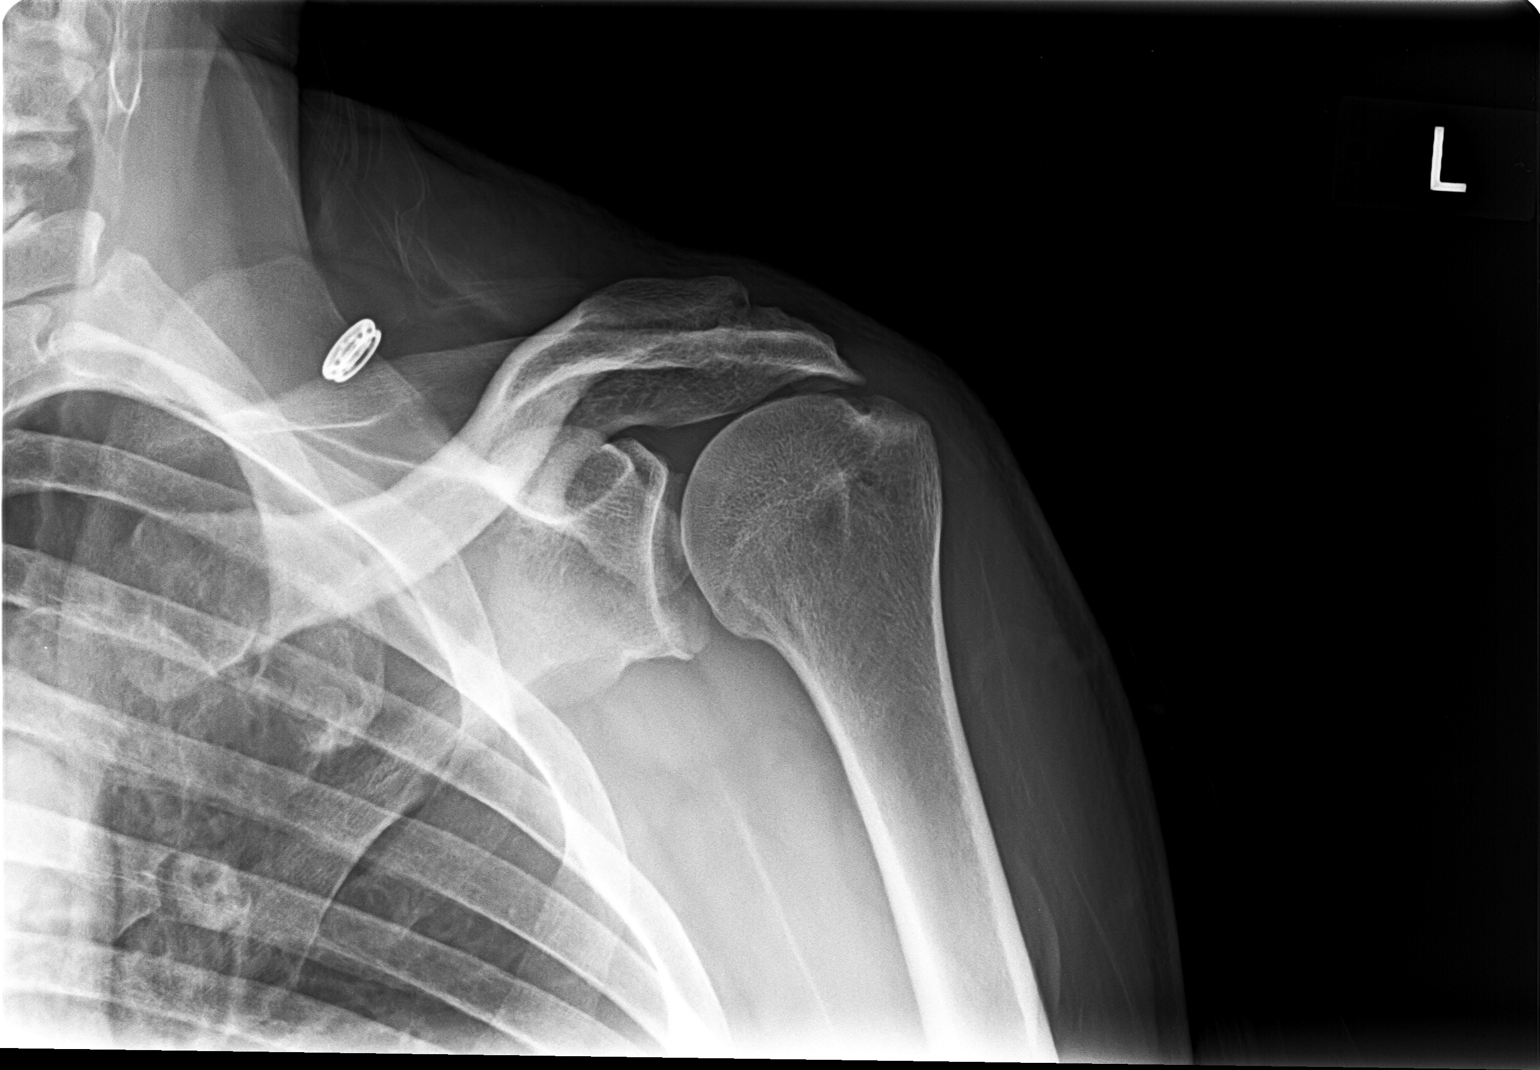

[view not recorded (2 of 3)]
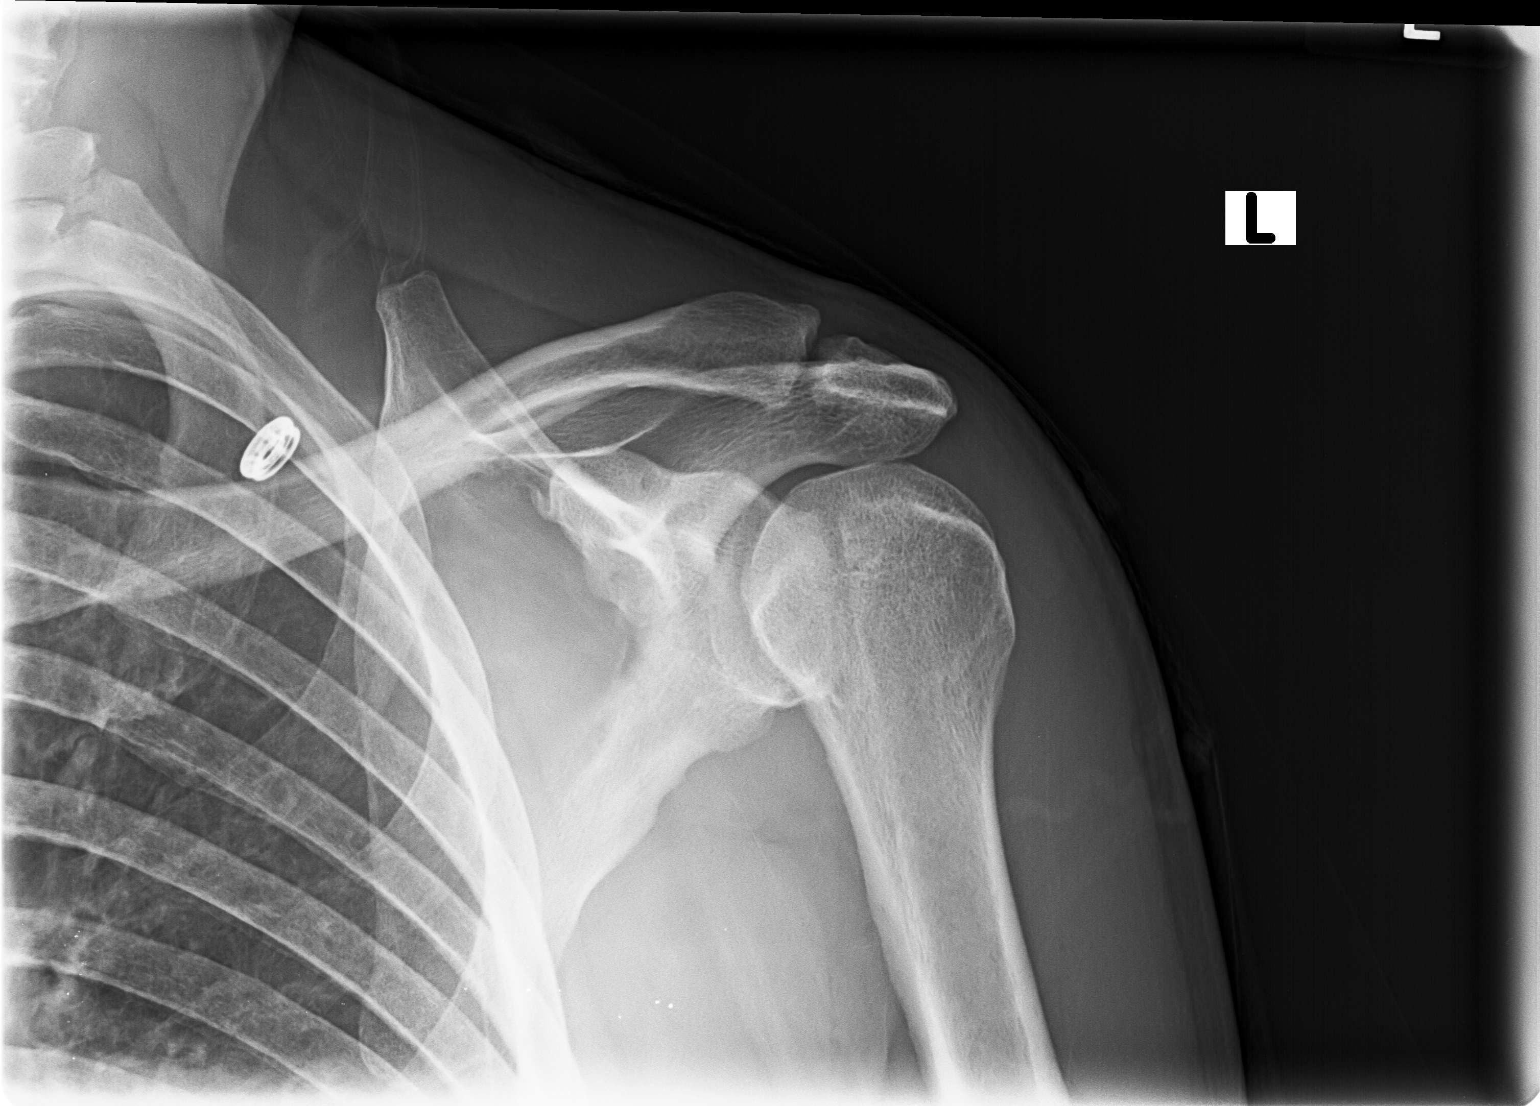

[view not recorded (3 of 3)]
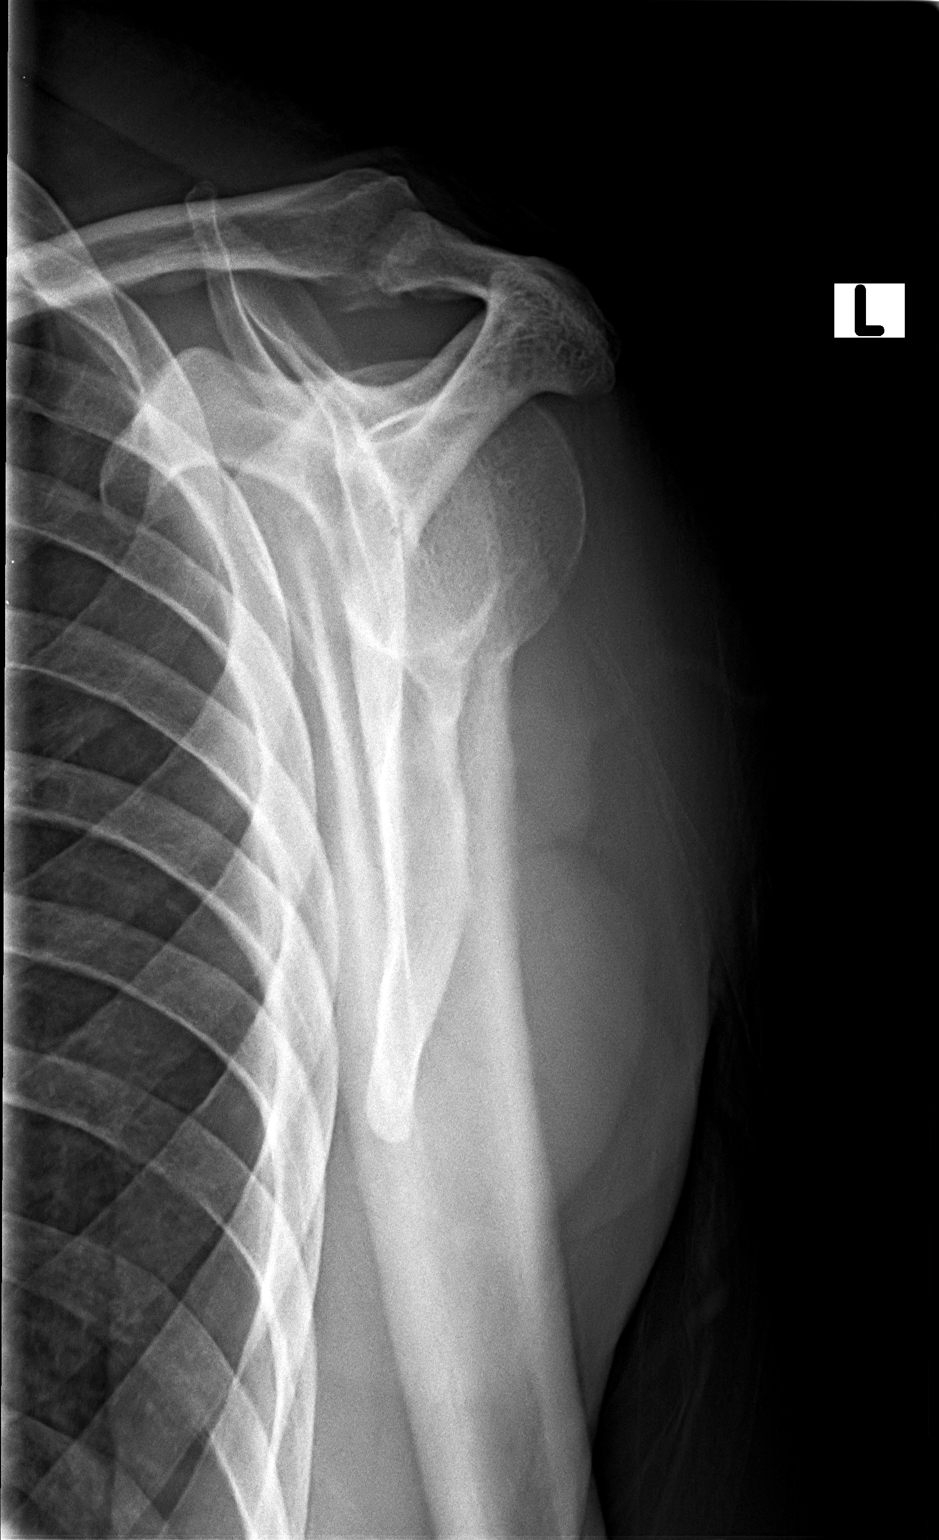

[3 of 3 positions shown; findings below may reference images not displayed]

FINDINGS: Left humeral head is in normal position and the glenohumeral joint
space appears normal. However there is significant acromial spurring
narrowing the subacromial joint space most consistent with
impingement and possibly chronic rotator cuff disease. No acute
fracture is seen.
IMPRESSION: Acromial spurring most consistent with impingement and possibly
chronic rotator cuff disease. No acute abnormality.

## 2016-10-19 ENCOUNTER — Ambulatory Visit (HOSPITAL_COMMUNITY): Payer: Worker's Compensation

## 2016-10-19 ENCOUNTER — Encounter (HOSPITAL_COMMUNITY): Payer: Self-pay

## 2016-10-19 DIAGNOSIS — M25612 Stiffness of left shoulder, not elsewhere classified: Secondary | ICD-10-CM

## 2016-10-19 DIAGNOSIS — R29898 Other symptoms and signs involving the musculoskeletal system: Secondary | ICD-10-CM

## 2016-10-19 DIAGNOSIS — M25512 Pain in left shoulder: Secondary | ICD-10-CM

## 2016-10-19 NOTE — Therapy (Signed)
[place], [place], [address] Phone: [phone]   Fax:  [phone]  Occupational Therapy Treatment  Patient Details  Name: [name] Foster MRN: [id number] Date of Birth: [birth year] Referring Provider: Dr. Tania Ade  Encounter Date: 10/19/2016      OT End of Session - 10/19/16 1714    Visit Number 11   Number of Visits 12   Date for OT Re-Evaluation 11/09/16   Authorization Type BCBS/Worker's Comp   Authorization Time Period Worker's Comp has approved 12 more visits on 10/10/16 . BCBS visit limit is 30. 12 out of 30 today.   Authorization - Visit Number 2   Authorization - Number of Visits 12   OT Start Time 1520   OT Stop Time 1605   OT Time Calculation (min) 45 min   Activity Tolerance Patient tolerated treatment well   Behavior During Therapy WFL for tasks assessed/performed      Past Medical History:  Diagnosis Date  . Abnormal EKG   . Apical variant hypertrophic cardiomyopathy ([place])   . Benign essential hypertension   . BPH (benign prostatic hyperplasia)   . Hypokalemia   . Tobacco abuse     Past Surgical History:  Procedure Laterality Date  . CARDIAC CATHETERIZATION     2008  . COLONOSCOPY N/A 02/26/2015   Procedure: COLONOSCOPY;  Surgeon: Rogene Houston, MD;  [facility];  Service: Endoscopy;  Laterality: N/A;  830  . KIDNEY SURGERY Right April 2015   benign tumor removal  . PROSTATE ABLATION  12-2015 at [facility]  . SHOULDER ACROMIOPLASTY Left 01/18/2016   Procedure: SHOULDER ACROMIOPLASTY;  Surgeon: Milly Jakob, MD;  Location: [facility];  Service: Orthopedics;  Laterality: Left;  . SHOULDER ARTHROSCOPY WITH ROTATOR CUFF REPAIR AND SUBACROMIAL DECOMPRESSION Left 01/18/2016   Procedure: LEFT SHOULDER ARTHROSCOPY WITH ROTATOR CUFF REPAIR AND SUBACROMIAL DECOMPRESSION;  Surgeon: Milly Jakob, MD;  Location: [place];  Service: Orthopedics;   Laterality: Left;  PRE-OP BLOCK WITH GENERAL ANESTHESIA    There were no vitals filed for this visit.      Subjective Assessment - 10/19/16 1712    Subjective  S: I feel like we're doing everything we need to do to my arm but it just won't work.   Currently in Pain? Yes   Pain Score 5    Pain Location Shoulder   Pain Orientation Left   Pain Descriptors / Indicators Aching   Pain Type Acute pain                      OT Treatments/Exercises (OP) - 10/19/16 1542      Exercises   Exercises Shoulder     Shoulder Exercises: Supine   Protraction PROM;5 reps;AAROM;15 reps   Horizontal ABduction PROM;5 reps;AAROM;15 reps   External Rotation PROM;5 reps;AAROM;15 reps   Internal Rotation PROM;5 reps;AAROM;15 reps   Flexion PROM;5 reps;AAROM;15 reps   ABduction PROM;5 reps;AAROM;15 reps     Shoulder Exercises: Standing   Protraction AAROM;12 reps   External Rotation AAROM;12 reps   Internal Rotation AAROM;12 reps   Flexion AAROM;12 reps   ABduction AAROM;12 reps     Shoulder Exercises: ROM/Strengthening   Wall Wash 1'   Proximal Shoulder Strengthening, Supine 10X      Manual Therapy   Manual Therapy Myofascial release   Manual therapy comments performed separately from all other skilled intervetnions    Myofascial Release myofascial release  and manual stretching to left upper arm, scapular, and shoulder regions to decrease pain and restrictions and improve pain free mobility.  Added manual cervical traction and myofascial release to cervical region this date.                   OT Short Term Goals - 10/19/16 1717      OT SHORT TERM GOAL #1   Title Pt will be educated on HEP.    Time 4   Period Weeks     OT SHORT TERM GOAL #2   Title Pt will decrease pain in LUE to 4/10 or less to improve ability to complete daily tasks.   Time 4   Period Weeks   Status On-going     OT SHORT TERM GOAL #3   Title Pt will decrease LUE fascial restrictions to min  amounts to improve mobility in LUE during functional tasks.    Time 4   Period Weeks     OT SHORT TERM GOAL #4   Title Pt will improve A/ROM of LUE by 50% to improve ability to use LUE during ADL completion.    Time 4   Period Weeks   Status Partially Met     OT SHORT TERM GOAL #5   Title Pt will improve LUE strength to 3/5 to increase ability to use LUE as assist during ADL and work tasks.   Time 4   Period Weeks   Status On-going                  Plan - 10/19/16 1716    Clinical Impression Statement A: Added wall wash and proximal shoulder strengthening this session. patient continues to have limitations with shoulder strength. No popping experienced this session.   Plan P: Continue with wall wash and proximal shoulder strengthening.       Patient will benefit from skilled therapeutic intervention in order to improve the following deficits and impairments:  Decreased activity tolerance, Decreased strength, Impaired flexibility, Decreased range of motion, Pain, Increased fascial restricitons, Impaired UE functional use  Visit Diagnosis: Stiffness of left shoulder, not elsewhere classified  Acute pain of left shoulder  Other symptoms and signs involving the musculoskeletal system    Problem List Patient Active Problem List   Diagnosis Date Noted  . Normal coronary arteries 2008 09/02/2016  . H/O renal cell cancer 09/02/2016  . Hypokalemia   . Preop cardiovascular exam 02/24/2014  . Apical variant hypertrophic cardiomyopathy ([place]) 02/24/2014  . Tobacco abuse 02/24/2014  . Essential hypertension 12/12/2008  . CHEST PAIN-UNSPECIFIED 12/12/2008  . ABNORMAL EKG 12/12/2008   Ailene Ravel, OTR/L,CBIS  [phone]  10/19/2016, 5:24 PM  [facility] [address] [place], [place], [address] Phone: [phone]   Fax:  [phone]  Name: [name] Foster MRN: [id number] Date of Birth: 1955/11/13

## 2016-10-26 ENCOUNTER — Ambulatory Visit (HOSPITAL_COMMUNITY): Payer: Worker's Compensation | Admitting: Specialist

## 2016-11-02 ENCOUNTER — Ambulatory Visit (HOSPITAL_COMMUNITY): Payer: Worker's Compensation | Attending: Orthopedic Surgery

## 2016-11-02 ENCOUNTER — Encounter (HOSPITAL_COMMUNITY): Payer: Self-pay

## 2016-11-02 DIAGNOSIS — M25612 Stiffness of left shoulder, not elsewhere classified: Secondary | ICD-10-CM | POA: Insufficient documentation

## 2016-11-02 DIAGNOSIS — M25512 Pain in left shoulder: Secondary | ICD-10-CM | POA: Insufficient documentation

## 2016-11-02 DIAGNOSIS — R29898 Other symptoms and signs involving the musculoskeletal system: Secondary | ICD-10-CM | POA: Diagnosis present

## 2016-11-02 NOTE — Therapy (Signed)
[place], [place], [address] Phone: [phone]   Fax:  [phone]  Occupational Therapy Treatment  Patient Details  Name: [name] Foster MRN: [id number] Date of Birth: 06/16/1956 Referring Provider: Dr. Tania Ade  Encounter Date: 11/02/2016      OT End of Session - 11/02/16 1542    Visit Number 12   Number of Visits 14   Date for OT Re-Evaluation 11/09/16   Authorization Type BCBS/Worker's Comp   Authorization Time Period Worker's Comp has approved 12 more visits on 10/10/16 . BCBS visit limit is 30. 12 out of 30 today.   Authorization - Visit Number 3   Authorization - Number of Visits 12   OT Start Time 6270   OT Stop Time 1500   OT Time Calculation (min) 40 min   Activity Tolerance Patient tolerated treatment well   Behavior During Therapy WFL for tasks assessed/performed      Past Medical History:  Diagnosis Date  . Abnormal EKG   . Apical variant hypertrophic cardiomyopathy ([place])   . Benign essential hypertension   . BPH (benign prostatic hyperplasia)   . Hypokalemia   . Tobacco abuse     Past Surgical History:  Procedure Laterality Date  . CARDIAC CATHETERIZATION     2008  . COLONOSCOPY N/A 02/26/2015   Procedure: COLONOSCOPY;  Surgeon: Rogene Houston, MD;  [facility];  Service: Endoscopy;  Laterality: N/A;  830  . KIDNEY SURGERY Right April 2015   benign tumor removal  . PROSTATE ABLATION  12-[facility] at [facility]  . SHOULDER ACROMIOPLASTY Left 01/18/2016   Procedure: SHOULDER ACROMIOPLASTY;  Surgeon: Milly Jakob, MD;  Location: [facility];  Service: Orthopedics;  Laterality: Left;  . SHOULDER ARTHROSCOPY WITH ROTATOR CUFF REPAIR AND SUBACROMIAL DECOMPRESSION Left 01/18/2016   Procedure: LEFT SHOULDER ARTHROSCOPY WITH ROTATOR CUFF REPAIR AND SUBACROMIAL DECOMPRESSION;  Surgeon: Milly Jakob, MD;  Location: [facility];  Service: Orthopedics;   Laterality: Left;  PRE-OP BLOCK WITH GENERAL ANESTHESIA    There were no vitals filed for this visit.      Subjective Assessment - 11/02/16 1537    Subjective  S: I feel it grinding in there when I do my exercises.   Currently in Pain? Yes   Pain Score 5    Pain Location Shoulder   Pain Orientation Left   Pain Descriptors / Indicators Aching   Pain Type Acute pain   Pain Radiating Towards N/A   Pain Onset More than a month ago   Pain Frequency Intermittent   Aggravating Factors  movement and cold   Pain Relieving Factors pain meds, creams, rest, positioning   Effect of Pain on Daily Activities unable to use LUE during daily tasks   Multiple Pain Sites No            [facility] OT Assessment - 11/02/16 1539      Assessment   Diagnosis Left capsular release s/p Left RCR     Precautions   Precautions None                  OT Treatments/Exercises (OP) - 11/02/16 1540      Exercises   Exercises Shoulder     Shoulder Exercises: Supine   Protraction PROM;5 reps;AROM;10 reps   Horizontal ABduction PROM;5 reps;AAROM;15 reps   External Rotation PROM;5 reps;AROM;15 reps   Internal Rotation PROM;5 reps;AROM;15 reps   Flexion PROM;AROM;5 reps;AAROM;10 reps  ABduction PROM;5 reps;AAROM;15 reps     Shoulder Exercises: Prone   Flexion AROM;10 reps   Extension AROM;10 reps   Horizontal ABduction 1 AROM;10 reps     Shoulder Exercises: Standing   Protraction AROM;10 reps   External Rotation AROM;12 reps   Internal Rotation AROM;12 reps   Flexion AAROM;12 reps   ABduction AAROM;12 reps     Shoulder Exercises: ROM/Strengthening   UBE (Upper Arm Bike) Level 2 2' forward and 2' reverse   Wall Wash 1'   Proximal Shoulder Strengthening, Supine 10X      Manual Therapy   Manual Therapy Myofascial release;Muscle Energy Technique   Manual therapy comments performed separately from all other skilled intervetnions    Myofascial Release myofascial release and manual  stretching to left upper arm, scapular, and shoulder regions to decrease pain and restrictions and improve pain free mobility.  Added manual cervical traction and myofascial release to cervical region this date.    Muscle Energy Technique Muscle energy technique completed to left anterior deltoid  to relax tone and muscle spasm and improve range of motion.                   OT Short Term Goals - 10/19/16 1717      OT SHORT TERM GOAL #1   Title Pt will be educated on HEP.    Time 4   Period Weeks     OT SHORT TERM GOAL #2   Title Pt will decrease pain in LUE to 4/10 or less to improve ability to complete daily tasks.   Time 4   Period Weeks   Status On-going     OT SHORT TERM GOAL #3   Title Pt will decrease LUE fascial restrictions to min amounts to improve mobility in LUE during functional tasks.    Time 4   Period Weeks     OT SHORT TERM GOAL #4   Title Pt will improve A/ROM of LUE by 50% to improve ability to use LUE during ADL completion.    Time 4   Period Weeks   Status Partially Met     OT SHORT TERM GOAL #5   Title Pt will improve LUE strength to 3/5 to increase ability to use LUE as assist during ADL and work tasks.   Time 4   Period Weeks   Status On-going                  Plan - 11/02/16 1632    Clinical Impression Statement A: Pt was able to complete supine and standing A/ROM protraction without acheiving full range of motion. Also complete some repetitions of supine Active flexion. patient continues to be very limited with ability to complete exercises without assistance. Appears that this may be more of a structural issues versus strength.    Plan P: Continue with wall wash, proximal shoulder strengthening and prone exercises. Work on strengthening with the available range.      Patient will benefit from skilled therapeutic intervention in order to improve the following deficits and impairments:  Decreased activity tolerance, Decreased  strength, Impaired flexibility, Decreased range of motion, Pain, Increased fascial restricitons, Impaired UE functional use  Visit Diagnosis: Stiffness of left shoulder, not elsewhere classified  Acute pain of left shoulder  Other symptoms and signs involving the musculoskeletal system    Problem List Patient Active Problem List   Diagnosis Date Noted  . Normal coronary arteries 2008 09/02/2016  . H/O renal cell  cancer 09/02/2016  . Hypokalemia   . Preop cardiovascular exam 02/24/2014  . Apical variant hypertrophic cardiomyopathy ([place]) 02/24/2014  . Tobacco abuse 02/24/2014  . Essential hypertension 12/12/2008  . CHEST PAIN-UNSPECIFIED 12/12/2008  . ABNORMAL EKG 12/12/2008   Ailene Ravel, OTR/L,CBIS  [phone]  11/02/2016, 4:34 PM  [facility] [address] [place], [place], [address] Phone: [phone]   Fax:  [phone]  Name: [name] Foster MRN: [id number] Date of Birth: 11/20/1955

## 2016-11-04 ENCOUNTER — Encounter (HOSPITAL_COMMUNITY): Payer: Self-pay | Admitting: Occupational Therapy

## 2016-11-04 ENCOUNTER — Ambulatory Visit (HOSPITAL_COMMUNITY): Payer: Worker's Compensation | Admitting: Occupational Therapy

## 2016-11-04 DIAGNOSIS — R29898 Other symptoms and signs involving the musculoskeletal system: Secondary | ICD-10-CM

## 2016-11-04 DIAGNOSIS — M25612 Stiffness of left shoulder, not elsewhere classified: Secondary | ICD-10-CM

## 2016-11-04 DIAGNOSIS — M25512 Pain in left shoulder: Secondary | ICD-10-CM

## 2016-11-04 NOTE — Therapy (Signed)
[place], [place], [address] Phone: [phone]   Fax:  [phone]  Occupational Therapy Treatment  Patient Details  Name: [name] Foster MRN: [id number] Date of Birth: Nov 29, 1955 Referring Provider: Dr. Tania Ade  Encounter Date: 11/04/2016      OT End of Session - 11/04/16 1513    Visit Number 13   Number of Visits 14   Date for OT Re-Evaluation 11/09/16   Authorization Type BCBS/Worker's Comp   Authorization Time Period Worker's Comp has approved 12 more visits on 10/10/16 . BCBS visit limit is 30. 12 out of 30 today.   Authorization - Visit Number 4   Authorization - Number of Visits 12   OT Start Time 1425   OT Stop Time 1512   OT Time Calculation (min) 47 min   Activity Tolerance Patient tolerated treatment well   Behavior During Therapy WFL for tasks assessed/performed      Past Medical History:  Diagnosis Date  . Abnormal EKG   . Apical variant hypertrophic cardiomyopathy ([place])   . Benign essential hypertension   . BPH (benign prostatic hyperplasia)   . Hypokalemia   . Tobacco abuse     Past Surgical History:  Procedure Laterality Date  . CARDIAC CATHETERIZATION     2008  . COLONOSCOPY N/A 02/26/2015   Procedure: COLONOSCOPY;  Surgeon: Rogene Houston, MD;  [facility];  Service: Endoscopy;  Laterality: N/A;  830  . KIDNEY SURGERY Right April 2015   benign tumor removal  . PROSTATE ABLATION  12-2015 at [facility]  . SHOULDER ACROMIOPLASTY Left 01/18/2016   Procedure: SHOULDER ACROMIOPLASTY;  Surgeon: Milly Jakob, MD;  Location: [facility];  Service: Orthopedics;  Laterality: Left;  . SHOULDER ARTHROSCOPY WITH ROTATOR CUFF REPAIR AND SUBACROMIAL DECOMPRESSION Left 01/18/2016   Procedure: LEFT SHOULDER ARTHROSCOPY WITH ROTATOR CUFF REPAIR AND SUBACROMIAL DECOMPRESSION;  Surgeon: Milly Jakob, MD;  Location: [facility];  Service: Orthopedics;   Laterality: Left;  PRE-OP BLOCK WITH GENERAL ANESTHESIA    There were no vitals filed for this visit.      Subjective Assessment - 11/04/16 1424    Subjective  S: I just can't sleep at night.    Currently in Pain? Yes   Pain Score 5    Pain Location Shoulder   Pain Orientation Left   Pain Descriptors / Indicators Aching   Pain Type Acute pain   Pain Radiating Towards n/a   Pain Onset More than a month ago   Pain Frequency Intermittent   Aggravating Factors  movement and cold   Pain Relieving Factors pain medications, creams, rest, positioning   Effect of Pain on Daily Activities unable to use LUE for daily tasks   Multiple Pain Sites No            [facility] OT Assessment - 11/04/16 1424      Assessment   Diagnosis Left capsular release s/p Left RCR     Precautions   Precautions None                  OT Treatments/Exercises (OP) - 11/04/16 1428      Exercises   Exercises Shoulder     Shoulder Exercises: Supine   Protraction PROM;5 reps;AAROM;15 reps   Horizontal ABduction PROM;5 reps;AAROM;15 reps   External Rotation PROM;5 reps;AROM;15 reps   Internal Rotation PROM;5 reps;AROM;15 reps   Flexion PROM;AROM;5 reps;AAROM;10 reps   ABduction PROM;5 reps;AAROM;15 reps  Shoulder Exercises: Prone   Retraction AROM;10 reps   Flexion AROM;10 reps   Extension AROM;10 reps   Horizontal ABduction 1 AROM;10 reps     Shoulder Exercises: Sidelying   External Rotation AROM;10 reps   External Rotation Limitations to neutral   Internal Rotation AROM;10 reps   Flexion AROM;10 reps;Limitations   Flexion Limitations 30% range, painful   Other Sidelying Exercises protraction, 10X, A/ROM     Shoulder Exercises: Standing   Protraction AAROM;12 reps   External Rotation AROM;12 reps   Internal Rotation AROM;12 reps   Flexion AAROM;12 reps   ABduction AAROM;12 reps   Other Standing Exercises PVC pipe slide 12X     Shoulder Exercises: ROM/Strengthening   UBE  (Upper Arm Bike) Level 2 2' forward and 2' reverse   Wall Wash 1'   Proximal Shoulder Strengthening, Supine 10X      Manual Therapy   Manual Therapy Myofascial release;Muscle Energy Technique   Manual therapy comments performed separately from all other skilled intervetnions    Myofascial Release myofascial release and manual stretching to left upper arm, scapular, and shoulder regions to decrease pain and restrictions and improve pain free mobility.  Added manual cervical traction and myofascial release to cervical region this date.    Muscle Energy Technique Muscle energy technique completed to left anterior deltoid  to relax tone and muscle spasm and improve range of motion.                   OT Short Term Goals - 10/19/16 1717      OT SHORT TERM GOAL #1   Title Pt will be educated on HEP.    Time 4   Period Weeks     OT SHORT TERM GOAL #2   Title Pt will decrease pain in LUE to 4/10 or less to improve ability to complete daily tasks.   Time 4   Period Weeks   Status On-going     OT SHORT TERM GOAL #3   Title Pt will decrease LUE fascial restrictions to min amounts to improve mobility in LUE during functional tasks.    Time 4   Period Weeks     OT SHORT TERM GOAL #4   Title Pt will improve A/ROM of LUE by 50% to improve ability to use LUE during ADL completion.    Time 4   Period Weeks   Status Partially Met     OT SHORT TERM GOAL #5   Title Pt will improve LUE strength to 3/5 to increase ability to use LUE as assist during ADL and work tasks.   Time 4   Period Weeks   Status On-going                  Plan - 11/04/16 1513    Clinical Impression Statement A: Pt able to complete sidelying with greater independence this session, attempted abduction however pt unable to complete due to pain. Pt continues to be limited in joint mobility and experiences increased pain and "grinding" sensation with certain movements. Pt reports completing his exercises  daily.    Plan P: Reassessment-determine if need to hold therapy, discharge, or continue.    OT Home Exercise Plan 11/13: table slides 09/14/16:  elevation and row.  09/21/16:  discontinue dowel rod exercises.   Consulted and Agree with Plan of Care Patient      Patient will benefit from skilled therapeutic intervention in order to improve the following deficits and impairments:  Decreased activity tolerance, Decreased strength, Impaired flexibility, Decreased range of motion, Pain, Increased fascial restricitons, Impaired UE functional use  Visit Diagnosis: Stiffness of left shoulder, not elsewhere classified  Acute pain of left shoulder  Other symptoms and signs involving the musculoskeletal system    Problem List Patient Active Problem List   Diagnosis Date Noted  . Normal coronary arteries 2008 09/02/2016  . H/O renal cell cancer 09/02/2016  . Hypokalemia   . Preop cardiovascular exam 02/24/2014  . Apical variant hypertrophic cardiomyopathy ([place]) 02/24/2014  . Tobacco abuse 02/24/2014  . Essential hypertension 12/12/2008  . CHEST PAIN-UNSPECIFIED 12/12/2008  . ABNORMAL EKG 12/12/2008   Guadelupe Sabin, OTR/L  [phone] 11/04/2016, 3:15 PM  [facility] [address] [place], [place], [address] Phone: [phone]   Fax:  [phone]  Name: [name] Foster MRN: [id number] Date of Birth: Oct 22, 1956

## 2016-11-07 ENCOUNTER — Ambulatory Visit (HOSPITAL_COMMUNITY): Payer: Worker's Compensation | Admitting: Specialist

## 2016-11-07 DIAGNOSIS — M25612 Stiffness of left shoulder, not elsewhere classified: Secondary | ICD-10-CM

## 2016-11-07 DIAGNOSIS — M25512 Pain in left shoulder: Secondary | ICD-10-CM

## 2016-11-07 DIAGNOSIS — R29898 Other symptoms and signs involving the musculoskeletal system: Secondary | ICD-10-CM

## 2016-11-07 NOTE — Therapy (Signed)
[place], [place], [address] Phone: [phone]   Fax:  [phone]  Occupational Therapy Treatment/Recertification  Patient Details  Name: [name] Foster MRN: [id number] Date of Birth: [birth year] Referring Provider: Dr. Tania Ade  Encounter Date: 11/07/2016      OT End of Session - 11/07/16 1604    Visit Number 14   Number of Visits 30   Date for OT Re-Evaluation 01/06/17  mini reassess on 12/08/16   Authorization Type BCBS/Workers comp   Authorization Time Period Worker's Comp has approved 12 more visits on 10/10/16 . BCBS visit limit is 30. 12 out of 30 today.   Authorization - Visit Number 5   Authorization - Number of Visits 12   OT Start Time 4944   OT Stop Time 1525   OT Time Calculation (min) 43 min   Activity Tolerance Patient tolerated treatment well   Behavior During Therapy WFL for tasks assessed/performed      Past Medical History:  Diagnosis Date  . Abnormal EKG   . Apical variant hypertrophic cardiomyopathy ([place])   . Benign essential hypertension   . BPH (benign prostatic hyperplasia)   . Hypokalemia   . Tobacco abuse     Past Surgical History:  Procedure Laterality Date  . CARDIAC CATHETERIZATION     2008  . COLONOSCOPY N/A 02/26/2015   Procedure: COLONOSCOPY;  Surgeon: Rogene Houston, MD;  [facility];  Service: Endoscopy;  Laterality: N/A;  830  . KIDNEY SURGERY Right April 2015   benign tumor removal  . PROSTATE ABLATION  12-2015 at [facility]  . SHOULDER ACROMIOPLASTY Left 01/18/2016   Procedure: SHOULDER ACROMIOPLASTY;  Surgeon: Milly Jakob, MD;  Location: [facility];  Service: Orthopedics;  Laterality: Left;  . SHOULDER ARTHROSCOPY WITH ROTATOR CUFF REPAIR AND SUBACROMIAL DECOMPRESSION Left 01/18/2016   Procedure: LEFT SHOULDER ARTHROSCOPY WITH ROTATOR CUFF REPAIR AND SUBACROMIAL DECOMPRESSION;  Surgeon: Milly Jakob, MD;  Location: [facility];  Service: Orthopedics;  Laterality: Left;  PRE-OP BLOCK WITH GENERAL ANESTHESIA    There were no vitals filed for this visit.      Subjective Assessment - 11/07/16 1603    Subjective  S:  I just cant seem to reach forward.  I don't know what the MD is going to want to do.   Currently in Pain? Yes   Pain Score 5    Pain Location Shoulder   Pain Orientation Left   Pain Descriptors / Indicators Aching   Pain Type Acute pain   Pain Onset More than a month ago   Pain Frequency Intermittent   Aggravating Factors  movement and cold   Pain Relieving Factors rest, positioning   Effect of Pain on Daily Activities unable to use left arm with functional tasks            [facility] OT Assessment - 11/07/16 0001      Assessment   Diagnosis Left capsular release s/p Left RCR   Referring Provider Dr. Tania Ade   Onset Date 09/08/16   Prior Therapy PT after L RCR     Precautions   Precautions None     Balance Screen   Has the patient fallen in the past 6 months No   Has the patient had a decrease in activity level because of a fear of falling?  No   Is the patient reluctant to leave their home because of a fear of falling?  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, mowing, weedeating, etc     ADL   ADL comments Patient continues to have increased pain with functional use of left arm.  He is unable to reach to shoulder height or above when completing daily activities and is not able to return to work duties at this time.      Palpation   Palpation comment minimal-moderate fascial restrictions      AROM   Overall AROM Comments Assessed seated, er/IR adducted   Left Shoulder Flexion 75 Degrees  61   Left Shoulder ABduction 65 Degrees  61   Left Shoulder Internal Rotation 90 Degrees  90   Left Shoulder External Rotation 0 Degrees  10     PROM   Overall PROM  Comments Assessed supine, er/IR adducted   Left Shoulder Flexion 135 Degrees  107   Left Shoulder ABduction 125 Degrees  111   Left Shoulder Internal Rotation 90 Degrees  90   Left Shoulder External Rotation 35 Degrees  45     Strength   Overall Strength Comments in given range, range not [facility]   Left Shoulder Flexion 3+/5  2-/5   Left Shoulder ABduction 3+/5  2-/5   Left Shoulder Internal Rotation 3+/5  3/5   Left Shoulder External Rotation 3+/5  2-/5                  OT Treatments/Exercises (OP) - 11/07/16 0001      Exercises   Exercises Shoulder     Shoulder Exercises: Supine   Protraction PROM;5 reps   Horizontal ABduction PROM;5 reps   External Rotation PROM;5 reps   Internal Rotation PROM;5 reps   Flexion PROM;5 reps   ABduction PROM;5 reps     Shoulder Exercises: Standing   ABduction Theraband;15 reps   Theraband Level (Shoulder ABduction) Level 2 (Red)   Extension Theraband;15 reps   Theraband Level (Shoulder Extension) Level 2 (Red)   Row Theraband;15 reps   Theraband Level (Shoulder Row) Level 2 (Red)     Shoulder Exercises: ROM/Strengthening   Wall Wash 1'   Other ROM/Strengthening Exercises pinch tree, able to place 12 clothespins onto vertical pole prior to stopping due to range and pain     Manual Therapy   Manual Therapy Myofascial release   Manual therapy comments performed separately from all other skilled intervetnions    Myofascial Release myofascial release and manual stretching to left upper arm, scapular, and shoulder regions to decrease pain and restrictions and improve pain free mobility.  Added manual cervical traction and myofascial release to cervical region this date.                   OT Short Term Goals - 11/07/16 1501      OT SHORT TERM GOAL #1   Title Pt will be educated on HEP.    Time 4   Period Weeks     OT SHORT TERM GOAL #2   Title Pt will decrease pain in LUE to 4/10 or less to improve ability to  complete daily tasks.   Time 4   Period Weeks   Status On-going     OT SHORT TERM GOAL #3   Title Pt will decrease LUE fascial restrictions to min amounts to improve mobility in LUE during functional tasks.    Time 4   Period  Weeks     OT SHORT TERM GOAL #4   Title Pt will improve A/ROM of LUE by 50% to improve ability to use LUE during ADL completion.    Time 4   Period Weeks   Status Partially Met     OT SHORT TERM GOAL #5   Title Pt will improve LUE strength to 3/5 to increase ability to use LUE as assist during ADL and work tasks.   Time 4   Period Weeks   Status Achieved           OT Long Term Goals - 11/07/16 1607      OT LONG TERM GOAL #1   Title Patient will use his left arm to reach to items at or slightly above shoulder height during ADL completion.     Time 8   Period Weeks   Status New     OT LONG TERM GOAL #2   Title Patient will improve left shoulder A/ROM to 100 degrees or better flexion and abduction and 20 degrees external rotation in order to use left arm with reaching and dressing tasks.    Time 8   Period Weeks   Status New     OT LONG TERM GOAL #3   Title Patient will improve left shoulder strength to 4-/5 in given range for increased ability to competing functional lifting activities.    Time 8   Period Weeks   Status New               Plan - 11/07/16 1605    Clinical Impression Statement A:  Patient has met 4/5 short term goals and has made progress towards A/ROM and P/ROM in flexion and abduction, however, external rotation has decreased.  Patient continues to have significant pain with active shoulder movement and is not able to use his left arm functionally.  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  If MD agrees we will follow with the goals and treatment plan outlined below.    Rehab Potential Good   OT Frequency 2x / week   OT Duration 8 weeks   OT Treatment/Interventions  Self-care/ADL training;Therapeutic exercise;Patient/family education;Manual Therapy;Cryotherapy;Therapeutic activities;Electrical Stimulation;Moist Heat;Passive range of motion   Plan P:  Awaiting MD response to continue or dc.        Patient will benefit from skilled therapeutic intervention in order to improve the following deficits and impairments:  Decreased activity tolerance, Decreased strength, Impaired flexibility, Decreased range of motion, Pain, Increased fascial restricitons, Impaired UE functional use  Visit Diagnosis: Stiffness of left shoulder, not elsewhere classified - Plan: Ot plan of care cert/re-cert  Acute pain of left shoulder - Plan: Ot plan of care cert/re-cert  Other symptoms and signs involving the musculoskeletal system - Plan: Ot plan of care cert/re-cert    Problem List Patient Active Problem List   Diagnosis Date Noted  . Normal coronary arteries 2008 09/02/2016  . H/O renal cell cancer 09/02/2016  . Hypokalemia   . Preop cardiovascular exam 02/24/2014  . Apical variant hypertrophic cardiomyopathy ([place]) 02/24/2014  . Tobacco abuse 02/24/2014  . Essential hypertension 12/12/2008  . CHEST PAIN-UNSPECIFIED 12/12/2008  . ABNORMAL EKG 12/12/2008    Vangie Bicker, [place], OTR/L [phone]  11/07/2016, 4:18 PM  [facility] [address] [place], [place], [address] Phone: [phone]   Fax:  [phone]  Name: [name] Foster MRN: [id number] Date of Birth: 02-Jul-1956

## 2016-11-09 ENCOUNTER — Ambulatory Visit (HOSPITAL_COMMUNITY): Payer: Worker's Compensation | Admitting: Occupational Therapy

## 2016-11-11 ENCOUNTER — Encounter (HOSPITAL_COMMUNITY): Payer: Self-pay | Admitting: Occupational Therapy

## 2016-11-11 ENCOUNTER — Ambulatory Visit (HOSPITAL_COMMUNITY): Payer: Worker's Compensation | Admitting: Occupational Therapy

## 2016-11-11 DIAGNOSIS — R29898 Other symptoms and signs involving the musculoskeletal system: Secondary | ICD-10-CM

## 2016-11-11 DIAGNOSIS — M25612 Stiffness of left shoulder, not elsewhere classified: Secondary | ICD-10-CM | POA: Diagnosis not present

## 2016-11-11 DIAGNOSIS — M25512 Pain in left shoulder: Secondary | ICD-10-CM

## 2016-11-11 NOTE — Therapy (Signed)
[facility] [address] [place], [place], [address] Phone: [phone]   Fax:  [phone]  Occupational Therapy Treatment  Patient Details  Name: [name] Foster MRN: [id number] Date of Birth: 02/21/1956 Referring Provider: Dr. Justin Chandler  Encounter Date: 11/11/2016      OT End of Session - 11/11/16 1608    Visit Number 15   Number of Visits 30   Date for OT Re-Evaluation 01/06/17  mini reassess on 12/08/16   Authorization Type BCBS/Workers comp   Authorization Time Period Worker's Comp has approved 12 more visits on 10/10/16 . [employer] visit limit is 30. 12 out of 30 today.   Authorization - Visit Number 6   Authorization - Number of Visits 12   OT Start Time 1431   OT Stop Time 1513   OT Time Calculation (min) 42 min   Activity Tolerance Patient tolerated treatment well   Behavior During Therapy WFL for tasks assessed/performed      Past Medical History:  Diagnosis Date  . Abnormal EKG   . Apical variant hypertrophic cardiomyopathy (HCC)   . Benign essential hypertension   . BPH (benign prostatic hyperplasia)   . Hypokalemia   . Tobacco abuse     Past Surgical History:  Procedure Laterality Date  . CARDIAC CATHETERIZATION     2008  . COLONOSCOPY N/A 02/26/2015   Procedure: COLONOSCOPY;  Surgeon: Najeeb U Rehman, MD;  [facility];  Service: Endoscopy;  Laterality: N/A;  830  . KIDNEY SURGERY Right April 2015   benign tumor removal  . PROSTATE ABLATION  12-2015 at [facility]  . SHOULDER ACROMIOPLASTY Left 01/18/2016   Procedure: SHOULDER ACROMIOPLASTY;  Surgeon: David Thompson, MD;  Location: [facility];  Service: Orthopedics;  Laterality: Left;  . SHOULDER ARTHROSCOPY WITH ROTATOR CUFF REPAIR AND SUBACROMIAL DECOMPRESSION Left 01/18/2016   Procedure: LEFT SHOULDER ARTHROSCOPY WITH ROTATOR CUFF REPAIR AND SUBACROMIAL DECOMPRESSION;  Surgeon: David Thompson, MD;  Location: [facility];   Service: Orthopedics;  Laterality: Left;  PRE-OP BLOCK WITH GENERAL ANESTHESIA    There were no vitals filed for this visit.      Subjective Assessment - 11/11/16 1431    Subjective  S: The doctor said for me to continue with some more therapy.    Currently in Pain? Yes   Pain Score 5    Pain Location Shoulder   Pain Orientation Left   Pain Descriptors / Indicators Aching   Pain Type Acute pain   Pain Radiating Towards n/a   Pain Onset More than a month ago   Pain Frequency Intermittent   Aggravating Factors  movement and cold   Pain Relieving Factors rest, positioning   Effect of Pain on Daily Activities max difficulty using LUE with functional tasks.    Multiple Pain Sites No            [facility] OT Assessment - 11/11/16 1430      Assessment   Diagnosis Left capsular release s/p Left RCR     Precautions   Precautions None                  OT Treatments/Exercises (OP) - 11/11/16 1434      Exercises   Exercises Shoulder     Shoulder Exercises: Supine   Protraction PROM;5 reps;AAROM;15 reps   Horizontal ABduction PROM;5 reps;AAROM;15 reps   External Rotation PROM;5 reps;AAROM;15 reps   Internal Rotation PROM;5 reps;AAROM;15 reps   Flexion PROM;5   reps;AAROM;15 reps   ABduction PROM;5 reps;AAROM;15 reps     Shoulder Exercises: Sidelying   External Rotation AROM;10 reps   External Rotation Limitations to neutral   Internal Rotation AROM;10 reps   Flexion AROM;10 reps;Limitations   Flexion Limitations 30% range, painful   ABduction AROM;10 reps;Limitations   ABduction Limitations 40% range, painful   Other Sidelying Exercises protraction, 10X, A/ROM     Shoulder Exercises: Standing   Extension Theraband;15 reps   Theraband Level (Shoulder Extension) Level 2 (Red)   Row Theraband;15 reps   Theraband Level (Shoulder Row) Level 2 (Red)     Shoulder Exercises: ROM/Strengthening   UBE (Upper Arm Bike) Level 2 2' forward and 2' reverse   Proximal  Shoulder Strengthening, Supine 10X    Rhythmic Stabilization, Supine 25X at 45 degrees. Min/mod difficulty, pt unable to complete at 90 degrees     Shoulder Exercises: Stretch   External Rotation Stretch 3 reps;10 seconds     Manual Therapy   Manual Therapy Myofascial release   Manual therapy comments performed separately from all other skilled intervetnions    Myofascial Release myofascial release and manual stretching to left upper arm, scapular, and shoulder regions to decrease pain and restrictions and improve pain free mobility.  Added manual cervical traction and myofascial release to cervical region this date.                   OT Short Term Goals - 11/07/16 1501      OT SHORT TERM GOAL #1   Title Pt will be educated on HEP.    Time 4   Period Weeks     OT SHORT TERM GOAL #2   Title Pt will decrease pain in LUE to 4/10 or less to improve ability to complete daily tasks.   Time 4   Period Weeks   Status On-going     OT SHORT TERM GOAL #3   Title Pt will decrease LUE fascial restrictions to min amounts to improve mobility in LUE during functional tasks.    Time 4   Period Weeks     OT SHORT TERM GOAL #4   Title Pt will improve A/ROM of LUE by 50% to improve ability to use LUE during ADL completion.    Time 4   Period Weeks   Status Partially Met     OT SHORT TERM GOAL #5   Title Pt will improve LUE strength to 3/5 to increase ability to use LUE as assist during ADL and work tasks.   Time 4   Period Weeks   Status Achieved           OT Long Term Goals - 11/07/16 1607      OT LONG TERM GOAL #1   Title Patient will use his left arm to reach to items at or slightly above shoulder height during ADL completion.     Time 8   Period Weeks   Status New     OT LONG TERM GOAL #2   Title Patient will improve left shoulder A/ROM to 100 degrees or better flexion and abduction and 20 degrees external rotation in order to use left arm with reaching and dressing  tasks.    Time 8   Period Weeks   Status New     OT LONG TERM GOAL #3   Title Patient will improve left shoulder strength to 4-/5 in given range for increased ability to competing functional lifting activities.  Time 8   Period Weeks   Status New               Plan - 11/11/16 1608    Clinical Impression Statement A: Pt reports MD wants to continue with therapy as pt is making slow progress towards improved ROM and strength in LUE. While in supine pt worked to initiate motion with LUE then following through with RUE assist. Pt able to complete sidelying abduction this session, which he was previously unable to initiate.    Plan P: continue with POC, working towards improving strength in LUE within ROM and pain limits. Update HEP   OT Home Exercise Plan 11/13: table slides 09/14/16:  elevation and row.  09/21/16:  discontinue dowel rod exercises.   Consulted and Agree with Plan of Care Patient      Patient will benefit from skilled therapeutic intervention in order to improve the following deficits and impairments:  Decreased activity tolerance, Decreased strength, Impaired flexibility, Decreased range of motion, Pain, Increased fascial restricitons, Impaired UE functional use  Visit Diagnosis: Stiffness of left shoulder, not elsewhere classified  Acute pain of left shoulder  Other symptoms and signs involving the musculoskeletal system    Problem List Patient Active Problem List   Diagnosis Date Noted  . Normal coronary arteries 2008 09/02/2016  . H/O renal cell cancer 09/02/2016  . Hypokalemia   . Preop cardiovascular exam 02/24/2014  . Apical variant hypertrophic cardiomyopathy ([place]) 02/24/2014  . Tobacco abuse 02/24/2014  . Essential hypertension 12/12/2008  . CHEST PAIN-UNSPECIFIED 12/12/2008  . ABNORMAL EKG 12/12/2008   Guadelupe Sabin, OTR/L  [phone] 11/11/2016, 4:13 PM  [place], [place], [address] Phone: [phone]   Fax:  [phone]  Name: [name] Foster MRN: [id number] Date of Birth: 11-19-1955

## 2016-11-18 ENCOUNTER — Telehealth (HOSPITAL_COMMUNITY): Payer: Self-pay | Admitting: Family Medicine

## 2016-11-18 NOTE — Telephone Encounter (Signed)
11/18/16 pt has called to see if we have heard anything from Tobey Bride about approving more visits.  I left her a message for her to please call us and see about getting more visits approved.  I have spoken to Bland Span and let him know.

## 2016-11-22 ENCOUNTER — Ambulatory Visit (HOSPITAL_COMMUNITY): Payer: Worker's Compensation

## 2016-11-22 DIAGNOSIS — M25612 Stiffness of left shoulder, not elsewhere classified: Secondary | ICD-10-CM | POA: Diagnosis not present

## 2016-11-22 DIAGNOSIS — M25512 Pain in left shoulder: Secondary | ICD-10-CM

## 2016-11-22 DIAGNOSIS — R29898 Other symptoms and signs involving the musculoskeletal system: Secondary | ICD-10-CM

## 2016-11-22 NOTE — Therapy (Signed)
[place], [place], [address] Phone: [phone]   Fax:  [phone]  Occupational Therapy Treatment  Patient Details  Name: [name] Foster MRN: [id number] Date of Birth: 1956/05/15 Referring Provider: Dr. Tania Ade  Encounter Date: 11/22/2016      OT End of Session - 11/22/16 1516    Visit Number 16   Number of Visits 30   Date for OT Re-Evaluation 01/06/17  mini reassess on 12/08/16   Authorization Type Workers comp   Authorization Time Period Worker's Comp has approved 12 more visits on 11/18/16.   Authorization - Visit Number 7   Authorization - Number of Visits 24   OT Start Time 1941   OT Stop Time 7408   OT Time Calculation (min) 38 min   Activity Tolerance Patient tolerated treatment well   Behavior During Therapy WFL for tasks assessed/performed      Past Medical History:  Diagnosis Date  . Abnormal EKG   . Apical variant hypertrophic cardiomyopathy ([place])   . Benign essential hypertension   . BPH (benign prostatic hyperplasia)   . Hypokalemia   . Tobacco abuse     Past Surgical History:  Procedure Laterality Date  . CARDIAC CATHETERIZATION     2008  . COLONOSCOPY N/A 02/26/2015   Procedure: COLONOSCOPY;  Surgeon: Rogene Houston, MD;  [facility];  Service: Endoscopy;  Laterality: N/A;  830  . KIDNEY SURGERY Right April 2015   benign tumor removal  . PROSTATE ABLATION  12-2015 at [facility]  . SHOULDER ACROMIOPLASTY Left 01/18/2016   Procedure: SHOULDER ACROMIOPLASTY;  Surgeon: Milly Jakob, MD;  Location: [facility];  Service: Orthopedics;  Laterality: Left;  . SHOULDER ARTHROSCOPY WITH ROTATOR CUFF REPAIR AND SUBACROMIAL DECOMPRESSION Left 01/18/2016   Procedure: LEFT SHOULDER ARTHROSCOPY WITH ROTATOR CUFF REPAIR AND SUBACROMIAL DECOMPRESSION;  Surgeon: Milly Jakob, MD;  Location: [facility];  Service: Orthopedics;  Laterality: Left;  PRE-OP BLOCK  WITH GENERAL ANESTHESIA    There were no vitals filed for this visit.                    OT Treatments/Exercises (OP) - 11/22/16 1502      Exercises   Exercises Shoulder     Shoulder Exercises: Supine   Protraction PROM;5 reps;AROM;10 reps   Horizontal ABduction PROM;5 reps;AROM;10 reps   External Rotation PROM;5 reps;AAROM;15 reps   Internal Rotation PROM;5 reps;AAROM;15 reps   Flexion PROM;5 reps;AAROM;15 reps   ABduction PROM;5 reps;AAROM;15 reps     Shoulder Exercises: Sidelying   External Rotation AROM;10 reps   External Rotation Limitations to neutral   Internal Rotation AROM;10 reps   Flexion AROM;10 reps;Limitations   Flexion Limitations 50% range   ABduction AROM;10 reps;Limitations   ABduction Limitations 50% range   Other Sidelying Exercises protraction, 10X, A/ROM     Shoulder Exercises: ROM/Strengthening   UBE (Upper Arm Bike) Level 2 3' reverse     Manual Therapy   Manual Therapy Myofascial release;Muscle Energy Technique   Manual therapy comments performed separately from all other skilled intervetnions    Myofascial Release myofascial release and manual stretching to left upper arm, scapular, and shoulder regions to decrease pain and restrictions and improve pain free mobility.  Added manual cervical traction and myofascial release to cervical region this date.    Muscle Energy Technique Muscle energy technique completed to left anterior deltoid  to relax tone and muscle spasm and  improve range of motion.                 OT Education - 11/22/16 1512    Education provided Yes   Education Details Pt is completing AA/ROM supine at home as well as A/ROM prone. patient was instructed to complete supine flexion and horizontal abduction without the dowel rod.   Person(s) Educated Patient   Methods Explanation;Demonstration   Comprehension Verbalized understanding          OT Short Term Goals - 11/22/16 1441      OT SHORT TERM GOAL  #1   Title Pt will be educated on HEP.    Time 4   Period Weeks     OT SHORT TERM GOAL #2   Title Pt will decrease pain in LUE to 4/10 or less to improve ability to complete daily tasks.   Time 4   Period Weeks   Status On-going     OT SHORT TERM GOAL #3   Title Pt will decrease LUE fascial restrictions to min amounts to improve mobility in LUE during functional tasks.    Time 4   Period Weeks     OT SHORT TERM GOAL #4   Title Pt will improve A/ROM of LUE by 50% to improve ability to use LUE during ADL completion.    Time 4   Period Weeks   Status Partially Met     OT SHORT TERM GOAL #5   Title Pt will improve LUE strength to 3/5 to increase ability to use LUE as assist during ADL and work tasks.   Time 4   Period Weeks           OT Long Term Goals - 11/22/16 1441      OT LONG TERM GOAL #1   Title Patient will use his left arm to reach to items at or slightly above shoulder height during ADL completion.     Time 8   Period Weeks   Status On-going     OT LONG TERM GOAL #2   Title Patient will improve left shoulder A/ROM to 100 degrees or better flexion and abduction and 20 degrees external rotation in order to use left arm with reaching and dressing tasks.    Time 8   Period Weeks   Status On-going     OT LONG TERM GOAL #3   Title Patient will improve left shoulder strength to 4-/5 in given range for increased ability to competing functional lifting activities.    Time 8   Period Weeks   Status On-going               Plan - 11/22/16 1545    Clinical Impression Statement A: Pt shows improvement today as he is able to perform both protraction and horizontal abduction supine at A/ROM. VC for form and technique requesting that he focus on locking his elbow and slow down movement.   Plan P: Continue to work on increasing P/ROM and strength of LUE within pain limits.       Patient will benefit from skilled therapeutic intervention in order to improve the  following deficits and impairments:  Decreased activity tolerance, Decreased strength, Impaired flexibility, Decreased range of motion, Pain, Increased fascial restricitons, Impaired UE functional use  Visit Diagnosis: Stiffness of left shoulder, not elsewhere classified  Acute pain of left shoulder  Other symptoms and signs involving the musculoskeletal system    Problem List Patient Active Problem List  Diagnosis Date Noted  . Normal coronary arteries 2008 09/02/2016  . H/O renal cell cancer 09/02/2016  . Hypokalemia   . Preop cardiovascular exam 02/24/2014  . Apical variant hypertrophic cardiomyopathy ([place]) 02/24/2014  . Tobacco abuse 02/24/2014  . Essential hypertension 12/12/2008  . CHEST PAIN-UNSPECIFIED 12/12/2008  . ABNORMAL EKG 12/12/2008   Ailene Ravel, OTR/L,CBIS  [phone]  11/22/2016, 3:49 PM  [facility] [address] [place], [place], [address] Phone: [phone]   Fax:  [phone]  Name: [name] Foster MRN: [id number] Date of Birth: 1956-09-19

## 2016-11-23 ENCOUNTER — Ambulatory Visit (HOSPITAL_COMMUNITY): Payer: Worker's Compensation | Admitting: Occupational Therapy

## 2016-11-23 ENCOUNTER — Encounter (HOSPITAL_COMMUNITY): Payer: Self-pay | Admitting: Occupational Therapy

## 2016-11-23 DIAGNOSIS — M25512 Pain in left shoulder: Secondary | ICD-10-CM

## 2016-11-23 DIAGNOSIS — R29898 Other symptoms and signs involving the musculoskeletal system: Secondary | ICD-10-CM

## 2016-11-23 DIAGNOSIS — M25612 Stiffness of left shoulder, not elsewhere classified: Secondary | ICD-10-CM | POA: Diagnosis not present

## 2016-11-23 NOTE — Therapy (Signed)
[place], [place], [address] Phone: [phone]   Fax:  [phone]  Occupational Therapy Treatment  Patient Details  Name: [name] Foster MRN: [id number] Date of Birth: Aug 29, 1956 Referring Provider: Dr. Tania Ade  Encounter Date: 11/23/2016      OT End of Session - 11/23/16 1555    Visit Number 17   Number of Visits 30   Date for OT Re-Evaluation 01/06/17  mini reassess on 12/08/16   Authorization Type Workers comp   Authorization Time Period Worker's Comp has approved 12 more visits on 11/18/16.   Authorization - Visit Number 8   Authorization - Number of Visits 24   OT Start Time 2703   OT Stop Time 1537   OT Time Calculation (min) 44 min   Activity Tolerance Patient tolerated treatment well   Behavior During Therapy WFL for tasks assessed/performed      Past Medical History:  Diagnosis Date  . Abnormal EKG   . Apical variant hypertrophic cardiomyopathy ([place])   . Benign essential hypertension   . BPH (benign prostatic hyperplasia)   . Hypokalemia   . Tobacco abuse     Past Surgical History:  Procedure Laterality Date  . CARDIAC CATHETERIZATION     2008  . COLONOSCOPY N/A 02/26/2015   Procedure: COLONOSCOPY;  Surgeon: Rogene Houston, MD;  [facility];  Service: Endoscopy;  Laterality: N/A;  830  . KIDNEY SURGERY Right April 2015   benign tumor removal  . PROSTATE ABLATION  12-2015 at [facility]  . SHOULDER ACROMIOPLASTY Left 01/18/2016   Procedure: SHOULDER ACROMIOPLASTY;  Surgeon: Milly Jakob, MD;  Location: [facility];  Service: Orthopedics;  Laterality: Left;  . SHOULDER ARTHROSCOPY WITH ROTATOR CUFF REPAIR AND SUBACROMIAL DECOMPRESSION Left 01/18/2016   Procedure: LEFT SHOULDER ARTHROSCOPY WITH ROTATOR CUFF REPAIR AND SUBACROMIAL DECOMPRESSION;  Surgeon: Milly Jakob, MD;  Location: [facility];  Service: Orthopedics;  Laterality: Left;  PRE-OP BLOCK  WITH GENERAL ANESTHESIA    There were no vitals filed for this visit.      Subjective Assessment - 11/23/16 1453    Subjective  S: The right shoulder is hurting more than the left today.    Currently in Pain? Yes   Pain Score 5    Pain Location Shoulder   Pain Orientation Left   Pain Descriptors / Indicators Aching   Pain Type Acute pain   Pain Radiating Towards n/a   Pain Onset More than a month ago   Pain Frequency Intermittent   Aggravating Factors  movement and cold   Pain Relieving Factors rest, positioning   Effect of Pain on Daily Activities max difficulty using LUE with functional tasks   Multiple Pain Sites No            [facility] OT Assessment - 11/23/16 1453      Assessment   Diagnosis Left capsular release s/p Left RCR     Precautions   Precautions None                  OT Treatments/Exercises (OP) - 11/23/16 1456      Exercises   Exercises Shoulder     Shoulder Exercises: Supine   Protraction PROM;5 reps;AROM;15 reps   Horizontal ABduction PROM;5 reps;AROM;12 reps   External Rotation PROM;5 reps;AAROM;15 reps   Internal Rotation PROM;5 reps;AAROM;15 reps   Flexion PROM;5 reps;AAROM;15 reps   ABduction PROM;5 reps;AAROM;15 reps     Shoulder  Exercises: Prone   Retraction AROM;10 reps   Flexion AROM;10 reps   Extension AROM;10 reps   Horizontal ABduction 1 AROM;10 reps     Shoulder Exercises: Standing   Protraction AAROM;12 reps   Horizontal ABduction AAROM;10 reps   External Rotation AROM;12 reps;Limitations   External Rotation Limitations to neutral   Internal Rotation AROM;12 reps   Flexion AAROM;12 reps   ABduction AAROM;12 reps   Extension Theraband;15 reps   Theraband Level (Shoulder Extension) Level 2 (Red)   Row Theraband;15 reps   Theraband Level (Shoulder Row) Level 2 (Red)     Shoulder Exercises: ROM/Strengthening   Proximal Shoulder Strengthening, Supine 10X    Rhythmic Stabilization, Supine 25X at 45 and 90 degrees.  Min difficulty   Other ROM/Strengthening Exercises pinch tree, able to place 15 clothespins onto vertical pole prior to stopping due to range and pain     Manual Therapy   Manual Therapy Myofascial release;Muscle Energy Technique   Manual therapy comments performed separately from all other skilled intervetnions    Myofascial Release myofascial release and manual stretching to left upper arm, scapular, and shoulder regions to decrease pain and restrictions and improve pain free mobility.  Added manual cervical traction and myofascial release to cervical region this date.    Muscle Energy Technique Muscle energy technique completed to left anterior deltoid  to relax tone and muscle spasm and improve range of motion.                 OT Education - 11/22/16 1512    Education provided Yes   Education Details Pt is completing AA/ROM supine at home as well as A/ROM prone. patient was instructed to complete supine flexion and horizontal abduction without the dowel rod.   Person(s) Educated Patient   Methods Explanation;Demonstration   Comprehension Verbalized understanding          OT Short Term Goals - 11/22/16 1441      OT SHORT TERM GOAL #1   Title Pt will be educated on HEP.    Time 4   Period Weeks     OT SHORT TERM GOAL #2   Title Pt will decrease pain in LUE to 4/10 or less to improve ability to complete daily tasks.   Time 4   Period Weeks   Status On-going     OT SHORT TERM GOAL #3   Title Pt will decrease LUE fascial restrictions to min amounts to improve mobility in LUE during functional tasks.    Time 4   Period Weeks     OT SHORT TERM GOAL #4   Title Pt will improve A/ROM of LUE by 50% to improve ability to use LUE during ADL completion.    Time 4   Period Weeks   Status Partially Met     OT SHORT TERM GOAL #5   Title Pt will improve LUE strength to 3/5 to increase ability to use LUE as assist during ADL and work tasks.   Time 4   Period Weeks            OT Long Term Goals - 11/22/16 1441      OT LONG TERM GOAL #1   Title Patient will use his left arm to reach to items at or slightly above shoulder height during ADL completion.     Time 8   Period Weeks   Status On-going     OT LONG TERM GOAL #2   Title Patient will improve  left shoulder A/ROM to 100 degrees or better flexion and abduction and 20 degrees external rotation in order to use left arm with reaching and dressing tasks.    Time 8   Period Weeks   Status On-going     OT LONG TERM GOAL #3   Title Patient will improve left shoulder strength to 4-/5 in given range for increased ability to competing functional lifting activities.    Time 8   Period Weeks   Status On-going               Plan - 11/23/16 1555    Clinical Impression Statement A: Pt able to completed AA/ROM horizontal abduction in standing this session as well as rythmic stabilization at 90 degrees, previously unable. Pt completed resistive clothespin task this session, placing 15 clothespins compared to 12 on previous trial. Verbal cuing for speed and form during exercises today.    Plan P: Continue working on improving P/ROM and strength within pain limits, resume stretches   OT Home Exercise Plan 11/13: table slides 09/14/16:  elevation and row.  09/21/16:  discontinue dowel rod exercises.   Consulted and Agree with Plan of Care Patient      Patient will benefit from skilled therapeutic intervention in order to improve the following deficits and impairments:  Decreased activity tolerance, Decreased strength, Impaired flexibility, Decreased range of motion, Pain, Increased fascial restricitons, Impaired UE functional use  Visit Diagnosis: Stiffness of left shoulder, not elsewhere classified  Acute pain of left shoulder  Other symptoms and signs involving the musculoskeletal system    Problem List Patient Active Problem List   Diagnosis Date Noted  . Normal coronary arteries 2008  09/02/2016  . H/O renal cell cancer 09/02/2016  . Hypokalemia   . Preop cardiovascular exam 02/24/2014  . Apical variant hypertrophic cardiomyopathy ([place]) 02/24/2014  . Tobacco abuse 02/24/2014  . Essential hypertension 12/12/2008  . CHEST PAIN-UNSPECIFIED 12/12/2008  . ABNORMAL EKG 12/12/2008   Guadelupe Sabin, OTR/L  [phone] 11/23/2016, 3:58 PM  [facility] [address] [place], [place], [address] Phone: [phone]   Fax:  [phone]  Name: [name] Foster MRN: [id number] Date of Birth: 06/07/1956

## 2016-11-25 ENCOUNTER — Encounter (HOSPITAL_COMMUNITY): Payer: Self-pay | Admitting: Occupational Therapy

## 2016-11-25 ENCOUNTER — Ambulatory Visit (HOSPITAL_COMMUNITY): Payer: Worker's Compensation | Admitting: Occupational Therapy

## 2016-11-25 DIAGNOSIS — R29898 Other symptoms and signs involving the musculoskeletal system: Secondary | ICD-10-CM

## 2016-11-25 DIAGNOSIS — M25612 Stiffness of left shoulder, not elsewhere classified: Secondary | ICD-10-CM

## 2016-11-25 DIAGNOSIS — M25512 Pain in left shoulder: Secondary | ICD-10-CM

## 2016-11-25 NOTE — Therapy (Signed)
[place], [place], [address] Phone: [phone]   Fax:  [phone]  Occupational Therapy Treatment  Patient Details  Name: [name] Foster MRN: [id number] Date of Birth: 12-27-1955 Referring Provider: Dr. Tania Ade  Encounter Date: 11/25/2016      OT End of Session - 11/25/16 1424    Visit Number 18   Number of Visits 30   Date for OT Re-Evaluation 01/06/17  mini reassess on 12/08/16   Authorization Type Workers comp   Authorization Time Period Worker's Comp has approved 12 more visits on 11/18/16.   Authorization - Visit Number 9   Authorization - Number of Visits 24   OT Start Time 1300   OT Stop Time 1342   OT Time Calculation (min) 42 min   Activity Tolerance Patient tolerated treatment well   Behavior During Therapy WFL for tasks assessed/performed      Past Medical History:  Diagnosis Date  . Abnormal EKG   . Apical variant hypertrophic cardiomyopathy ([place])   . Benign essential hypertension   . BPH (benign prostatic hyperplasia)   . Hypokalemia   . Tobacco abuse     Past Surgical History:  Procedure Laterality Date  . CARDIAC CATHETERIZATION     2008  . COLONOSCOPY N/A 02/26/2015   Procedure: COLONOSCOPY;  Surgeon: Rogene Houston, MD;  [facility];  Service: Endoscopy;  Laterality: N/A;  830  . KIDNEY SURGERY Right April 2015   benign tumor removal  . PROSTATE ABLATION  12-2015 at [facility]  . SHOULDER ACROMIOPLASTY Left 01/18/2016   Procedure: SHOULDER ACROMIOPLASTY;  Surgeon: Milly Jakob, MD;  Location: [facility];  Service: Orthopedics;  Laterality: Left;  . SHOULDER ARTHROSCOPY WITH ROTATOR CUFF REPAIR AND SUBACROMIAL DECOMPRESSION Left 01/18/2016   Procedure: LEFT SHOULDER ARTHROSCOPY WITH ROTATOR CUFF REPAIR AND SUBACROMIAL DECOMPRESSION;  Surgeon: Milly Jakob, MD;  Location: [facility];  Service: Orthopedics;  Laterality: Left;  PRE-OP BLOCK  WITH GENERAL ANESTHESIA    There were no vitals filed for this visit.      Subjective Assessment - 11/25/16 1300    Subjective  S: I only take the pills the doctor gave me at night so I can sleep.    Currently in Pain? Yes   Pain Score 5    Pain Location Shoulder   Pain Orientation Left   Pain Descriptors / Indicators Aching   Pain Type Acute pain   Pain Radiating Towards n/a   Pain Onset More than a month ago   Pain Frequency Intermittent   Aggravating Factors  movement and cold   Pain Relieving Factors rest, positioning   Effect of Pain on Daily Activities max difficulty using LUE with functional tasks   Multiple Pain Sites No            [facility] OT Assessment - 11/25/16 1259      Assessment   Diagnosis Left capsular release s/p Left RCR     Precautions   Precautions None                  OT Treatments/Exercises (OP) - 11/25/16 1303      Exercises   Exercises Shoulder     Shoulder Exercises: Supine   Protraction PROM;5 reps;AROM;15 reps   Horizontal ABduction PROM;5 reps;AROM;10 reps   External Rotation PROM;5 reps;AAROM;15 reps   Internal Rotation PROM;5 reps;AAROM;15 reps   Flexion PROM;5 reps;AAROM;10 reps   ABduction PROM;5 reps;AAROM;10 reps  Shoulder Exercises: Prone   Retraction AROM;10 reps   Flexion AROM;10 reps   Extension AROM;10 reps   Horizontal ABduction 1 AROM;10 reps     Shoulder Exercises: Sidelying   External Rotation AROM;12 reps   External Rotation Limitations to neutral   Internal Rotation AROM;12 reps   Flexion AROM;Limitations;12 reps   Flexion Limitations 50% range   ABduction AROM;Limitations;12 reps   ABduction Limitations 50% range   Other Sidelying Exercises protraction, 12X, A/ROM     Shoulder Exercises: ROM/Strengthening   Wall Wash 1'-focusing on flexion   Over Head Lace 1'   Proximal Shoulder Strengthening, Supine 10X      Functional Reaching Activities   Mid Level Pt placed and removed 8 cones from  middle shelf of overhead cabinet. Mod difficulty     Manual Therapy   Manual Therapy Myofascial release;Muscle Energy Technique   Manual therapy comments performed separately from all other skilled intervetnions    Myofascial Release myofascial release and manual stretching to left upper arm, scapular, and shoulder regions to decrease pain and restrictions and improve pain free mobility.  Added manual cervical traction and myofascial release to cervical region this date.                   OT Short Term Goals - 11/22/16 1441      OT SHORT TERM GOAL #1   Title Pt will be educated on HEP.    Time 4   Period Weeks     OT SHORT TERM GOAL #2   Title Pt will decrease pain in LUE to 4/10 or less to improve ability to complete daily tasks.   Time 4   Period Weeks   Status On-going     OT SHORT TERM GOAL #3   Title Pt will decrease LUE fascial restrictions to min amounts to improve mobility in LUE during functional tasks.    Time 4   Period Weeks     OT SHORT TERM GOAL #4   Title Pt will improve A/ROM of LUE by 50% to improve ability to use LUE during ADL completion.    Time 4   Period Weeks   Status Partially Met     OT SHORT TERM GOAL #5   Title Pt will improve LUE strength to 3/5 to increase ability to use LUE as assist during ADL and work tasks.   Time 4   Period Weeks           OT Long Term Goals - 11/22/16 1441      OT LONG TERM GOAL #1   Title Patient will use his left arm to reach to items at or slightly above shoulder height during ADL completion.     Time 8   Period Weeks   Status On-going     OT LONG TERM GOAL #2   Title Patient will improve left shoulder A/ROM to 100 degrees or better flexion and abduction and 20 degrees external rotation in order to use left arm with reaching and dressing tasks.    Time 8   Period Weeks   Status On-going     OT LONG TERM GOAL #3   Title Patient will improve left shoulder strength to 4-/5 in given range for  increased ability to competing functional lifting activities.    Time 8   Period Weeks   Status On-going               Plan - 11/25/16 1424  Clinical Impression Statement A: Added overhead lacing and functional reaching this session. Focused on slowing speed during exercises to achieve maximal range of motion and focusing on strengthening within range. Pt able to complete functional reaching to approximately 60% range this session.    Plan P: Continue with functional reaching and improving P/ROM and strength within pain limits, resume stretches   OT Home Exercise Plan 11/13: table slides 09/14/16:  elevation and row.  09/21/16:  discontinue dowel rod exercises.   Consulted and Agree with Plan of Care Patient      Patient will benefit from skilled therapeutic intervention in order to improve the following deficits and impairments:  Decreased activity tolerance, Decreased strength, Impaired flexibility, Decreased range of motion, Pain, Increased fascial restricitons, Impaired UE functional use  Visit Diagnosis: Stiffness of left shoulder, not elsewhere classified  Acute pain of left shoulder  Other symptoms and signs involving the musculoskeletal system    Problem List Patient Active Problem List   Diagnosis Date Noted  . Normal coronary arteries 2008 09/02/2016  . H/O renal cell cancer 09/02/2016  . Hypokalemia   . Preop cardiovascular exam 02/24/2014  . Apical variant hypertrophic cardiomyopathy ([place]) 02/24/2014  . Tobacco abuse 02/24/2014  . Essential hypertension 12/12/2008  . CHEST PAIN-UNSPECIFIED 12/12/2008  . ABNORMAL EKG 12/12/2008   Guadelupe Sabin, OTR/L  [phone] 11/25/2016, 2:30 PM  [facility] [address] [place], [place], [address] Phone: [phone]   Fax:  [phone]  Name: [name] Foster MRN: [id number] Date of Birth: 1956/08/29

## 2016-11-28 ENCOUNTER — Ambulatory Visit (HOSPITAL_COMMUNITY): Payer: Worker's Compensation | Admitting: Specialist

## 2016-11-28 DIAGNOSIS — M25612 Stiffness of left shoulder, not elsewhere classified: Secondary | ICD-10-CM | POA: Diagnosis not present

## 2016-11-28 DIAGNOSIS — M25512 Pain in left shoulder: Secondary | ICD-10-CM

## 2016-11-28 DIAGNOSIS — R29898 Other symptoms and signs involving the musculoskeletal system: Secondary | ICD-10-CM

## 2016-11-28 NOTE — Therapy (Signed)
[facility], [place], [address] Phone: [phone]   Fax:  [phone]  Occupational Therapy Treatment  Patient Details  Name: Fleet Higham MRN: [id number] Date of Birth: 05/27/1956 Referring Provider: Dr. Tania Ade  Encounter Date: 11/28/2016      OT End of Session - 11/28/16 1603    Visit Number 19   Number of Visits 30   Date for OT Re-Evaluation 01/06/17  mini reassess on 12/08/16   Authorization Type Workers comp   Authorization Time Period Worker's Comp has approved 12 more visits on 11/18/16.   Authorization - Visit Number 10   Authorization - Number of Visits 24   OT Start Time 3614   OT Stop Time 4315   OT Time Calculation (min) 41 min   Activity Tolerance Patient tolerated treatment well   Behavior During Therapy WFL for tasks assessed/performed      Past Medical History:  Diagnosis Date  . Abnormal EKG   . Apical variant hypertrophic cardiomyopathy ([place])   . Benign essential hypertension   . BPH (benign prostatic hyperplasia)   . Hypokalemia   . Tobacco abuse     Past Surgical History:  Procedure Laterality Date  . CARDIAC CATHETERIZATION     2008  . COLONOSCOPY N/A 02/26/2015   Procedure: COLONOSCOPY;  Surgeon: Rogene Houston, MD;  [facility];  Service: Endoscopy;  Laterality: N/A;  830  . KIDNEY SURGERY Right April 2015   benign tumor removal  . PROSTATE ABLATION  12-2015 at [facility]  . SHOULDER ACROMIOPLASTY Left 01/18/2016   Procedure: SHOULDER ACROMIOPLASTY;  Surgeon: Milly Jakob, MD;  Location: [facility];  Service: Orthopedics;  Laterality: Left;  . SHOULDER ARTHROSCOPY WITH ROTATOR CUFF REPAIR AND SUBACROMIAL DECOMPRESSION Left 01/18/2016   Procedure: LEFT SHOULDER ARTHROSCOPY WITH ROTATOR CUFF REPAIR AND SUBACROMIAL DECOMPRESSION;  Surgeon: Milly Jakob, MD;  Location: [facility];  Service: Orthopedics;  Laterality: Left;  PRE-OP BLOCK  WITH GENERAL ANESTHESIA    There were no vitals filed for this visit.      Subjective Assessment - 11/28/16 1434    Subjective  S:  I havent gotten back to work yet.   Currently in Pain? Yes   Pain Score 5    Pain Location Shoulder   Pain Orientation Left   Pain Descriptors / Indicators Aching   Pain Type Acute pain            [facility] OT Assessment - 11/28/16 0001      Assessment   Diagnosis Left capsular release s/p Left RCR     Precautions   Precautions None                  OT Treatments/Exercises (OP) - 11/28/16 0001      Exercises   Exercises Shoulder     Shoulder Exercises: Supine   Protraction PROM;5 reps;AROM;15 reps   Horizontal ABduction PROM;5 reps;AROM;10 reps   External Rotation PROM;5 reps;AROM;15 reps   Internal Rotation PROM;5 reps;AROM;15 reps   Flexion PROM;5 reps;AROM;10 reps   ABduction PROM;5 reps;AROM;10 reps   ABduction Limitations elbow flexed to 90, abducted to 90     Shoulder Exercises: ROM/Strengthening   UBE (Upper Arm Bike) levle 2:  3 minutes forward and 3 minutes reverse, concentrating on depressing shoulder blade   Wall Wash 1'-focusing on flexion   Over Head Lace 1'   Wall Pushups 15 reps   Proximal Shoulder Strengthening, Supine  10X    Prot/Ret//Elev/Dep flexion max difficulty 30" X 2 with rest break, attempted abduction could only complete for 15" with max difficulty   Other ROM/Strengthening Exercises finger ladder in standing up and down 5 times able to reach to 3 holes from top.  next session would like patient to remove arm from top for 5 second hold and then travel back down.     Manual Therapy   Manual Therapy Myofascial release   Manual therapy comments performed separately from all other skilled intervetnions    Myofascial Release myofascial release and manual stretching to left upper arm, scapular, and shoulder regions to decrease pain and restrictions and improve pain free mobility.  Added manual cervical  traction and myofascial release to cervical region this date.                   OT Short Term Goals - 11/22/16 1441      OT SHORT TERM GOAL #1   Title Pt will be educated on HEP.    Time 4   Period Weeks     OT SHORT TERM GOAL #2   Title Pt will decrease pain in LUE to 4/10 or less to improve ability to complete daily tasks.   Time 4   Period Weeks   Status On-going     OT SHORT TERM GOAL #3   Title Pt will decrease LUE fascial restrictions to min amounts to improve mobility in LUE during functional tasks.    Time 4   Period Weeks     OT SHORT TERM GOAL #4   Title Pt will improve A/ROM of LUE by 50% to improve ability to use LUE during ADL completion.    Time 4   Period Weeks   Status Partially Met     OT SHORT TERM GOAL #5   Title Pt will improve LUE strength to 3/5 to increase ability to use LUE as assist during ADL and work tasks.   Time 4   Period Weeks           OT Long Term Goals - 11/22/16 1441      OT LONG TERM GOAL #1   Title Patient will use his left arm to reach to items at or slightly above shoulder height during ADL completion.     Time 8   Period Weeks   Status On-going     OT LONG TERM GOAL #2   Title Patient will improve left shoulder A/ROM to 100 degrees or better flexion and abduction and 20 degrees external rotation in order to use left arm with reaching and dressing tasks.    Time 8   Period Weeks   Status On-going     OT LONG TERM GOAL #3   Title Patient will improve left shoulder strength to 4-/5 in given range for increased ability to competing functional lifting activities.    Time 8   Period Weeks   Status On-going               Plan - 11/28/16 1604    Clinical Impression Statement A:  able to complete ball on wall with minimal-moderate compensation, requiring rest break at 30".  attempted in abduction, however patient could not complete.  added finger ladder this date, patient able to complete with minimal  compensatory shoulder movement.    Plan P:  complete finger ladder, removing hand at top holding 5" and then returning to travel down.  attempt functional chest press  and overhead press with therapy ball in supine and standing.       Patient will benefit from skilled therapeutic intervention in order to improve the following deficits and impairments:  Decreased activity tolerance, Decreased strength, Impaired flexibility, Decreased range of motion, Pain, Increased fascial restricitons, Impaired UE functional use  Visit Diagnosis: Stiffness of left shoulder, not elsewhere classified  Acute pain of left shoulder  Other symptoms and signs involving the musculoskeletal system    Problem List Patient Active Problem List   Diagnosis Date Noted  . Normal coronary arteries 2008 09/02/2016  . H/O renal cell cancer 09/02/2016  . Hypokalemia   . Preop cardiovascular exam 02/24/2014  . Apical variant hypertrophic cardiomyopathy ([place]) 02/24/2014  . Tobacco abuse 02/24/2014  . Essential hypertension 12/12/2008  . CHEST PAIN-UNSPECIFIED 12/12/2008  . ABNORMAL EKG 12/12/2008    Vangie Bicker, [place], OTR/L [phone]  11/28/2016, 4:07 PM  [facility] [address] [place], [place], [address] Phone: [phone]   Fax:  [phone]  Name: Cadell Gabrielson MRN: [id number] Date of Birth: 1955-12-26

## 2016-11-30 ENCOUNTER — Encounter (HOSPITAL_COMMUNITY): Payer: Self-pay

## 2016-11-30 ENCOUNTER — Ambulatory Visit (HOSPITAL_COMMUNITY): Payer: Worker's Compensation

## 2016-11-30 ENCOUNTER — Telehealth (HOSPITAL_COMMUNITY): Payer: Self-pay

## 2016-11-30 DIAGNOSIS — M25512 Pain in left shoulder: Secondary | ICD-10-CM

## 2016-11-30 DIAGNOSIS — M25612 Stiffness of left shoulder, not elsewhere classified: Secondary | ICD-10-CM | POA: Diagnosis not present

## 2016-11-30 DIAGNOSIS — R29898 Other symptoms and signs involving the musculoskeletal system: Secondary | ICD-10-CM

## 2016-11-30 NOTE — Telephone Encounter (Signed)
Called patient to offer an earlier appointment time. Patient agreed to come in at 2:30PM today instead of 3:15PM.  Ailene Ravel, OTR/L,CBIS  [phone]

## 2016-11-30 NOTE — Therapy (Signed)
[facility] [place], [place], [address] Phone: [phone]   Fax:  [phone]  Occupational Therapy Treatment  Patient Details  Name: [name] Foster MRN: [id number] Date of Birth: Nov 28, 1955 Referring Provider: Dr. Tania Ade  Encounter Date: 11/30/2016      OT End of Session - 11/30/16 1535    Visit Number 20   Number of Visits 30   Date for OT Re-Evaluation 01/06/17  mini reassess on 12/08/16   Authorization Type Workers comp   Authorization Time Period Worker's Comp has approved 12 more visits on 11/18/16.   Authorization - Visit Number 11   Authorization - Number of Visits 24   OT Start Time 1430   OT Stop Time 1515   OT Time Calculation (min) 45 min   Activity Tolerance Patient tolerated treatment well   Behavior During Therapy WFL for tasks assessed/performed      Past Medical History:  Diagnosis Date  . Abnormal EKG   . Apical variant hypertrophic cardiomyopathy ([place])   . Benign essential hypertension   . BPH (benign prostatic hyperplasia)   . Hypokalemia   . Tobacco abuse     Past Surgical History:  Procedure Laterality Date  . CARDIAC CATHETERIZATION     2008  . COLONOSCOPY N/A 02/26/2015   Procedure: COLONOSCOPY;  Surgeon: Rogene Houston, MD;  [facility];  Service: Endoscopy;  Laterality: N/A;  830  . KIDNEY SURGERY Right April 2015   benign tumor removal  . PROSTATE ABLATION  12-2015 at [facility]  . SHOULDER ACROMIOPLASTY Left 01/18/2016   Procedure: SHOULDER ACROMIOPLASTY;  Surgeon: Milly Jakob, MD;  Location: [facility];  Service: Orthopedics;  Laterality: Left;  . SHOULDER ARTHROSCOPY WITH ROTATOR CUFF REPAIR AND SUBACROMIAL DECOMPRESSION Left 01/18/2016   Procedure: LEFT SHOULDER ARTHROSCOPY WITH ROTATOR CUFF REPAIR AND SUBACROMIAL DECOMPRESSION;  Surgeon: Milly Jakob, MD;  Location: [facility];  Service: Orthopedics;  Laterality: Left;  PRE-OP BLOCK  WITH GENERAL ANESTHESIA    There were no vitals filed for this visit.      Subjective Assessment - 11/30/16 1451    Subjective  S: I still have pain on the side of my arm and in my shoulder.    Currently in Pain? Yes   Pain Score 5    Pain Location Shoulder   Pain Orientation Left   Pain Descriptors / Indicators Aching   Pain Type Acute pain            [facility] OT Assessment - 11/30/16 1453      Assessment   Diagnosis Left capsular release s/p Left RCR     Precautions   Precautions None                  OT Treatments/Exercises (OP) - 11/30/16 1453      Exercises   Exercises Shoulder     Shoulder Exercises: Supine   Protraction PROM;5 reps   Horizontal ABduction PROM;5 reps   External Rotation PROM;5 reps;AROM;15 reps   Internal Rotation PROM;5 reps;AROM;15 reps   Flexion PROM;5 reps   ABduction PROM;5 reps;AROM;15 reps   Other Supine Exercises Protractions, flexion, horizontal abduction completed with green large therapy ball; 10X     Shoulder Exercises: Standing   Horizontal ABduction AAROM;10 reps   External Rotation AROM;12 reps;Limitations   External Rotation Limitations slightly past neutral   Internal Rotation AROM;12 reps   ABduction AAROM;12 reps   Extension Theraband;15 reps  Theraband Level (Shoulder Extension) Level 2 (Red)   Row Theraband;15 reps   Theraband Level (Shoulder Row) Level 2 (Red)   Other Standing Exercises Protraction, flexion (to 1/2 range)     Shoulder Exercises: ROM/Strengthening   UBE (Upper Arm Bike) levle 2:  3 minutes forward and 3 minutes reverse, concentrating on depressing shoulder blade   Wall Pushups 15 reps   Proximal Shoulder Strengthening, Supine 15X no rest breaks   Other ROM/Strengthening Exercises Finger ladder completed. Patient was able to climb to peg #9 and take hand off for 5" before walking back down. Completed 5 times.      Manual Therapy   Manual Therapy Myofascial release   Manual therapy  comments performed separately from all other skilled intervetnions    Myofascial Release myofascial release and manual stretching to left upper arm, scapular, and shoulder regions to decrease pain and restrictions and improve pain free mobility.  Added manual cervical traction and myofascial release to cervical region this date.                   OT Short Term Goals - 11/22/16 1441      OT SHORT TERM GOAL #1   Title Pt will be educated on HEP.    Time 4   Period Weeks     OT SHORT TERM GOAL #2   Title Pt will decrease pain in LUE to 4/10 or less to improve ability to complete daily tasks.   Time 4   Period Weeks   Status On-going     OT SHORT TERM GOAL #3   Title Pt will decrease LUE fascial restrictions to min amounts to improve mobility in LUE during functional tasks.    Time 4   Period Weeks     OT SHORT TERM GOAL #4   Title Pt will improve A/ROM of LUE by 50% to improve ability to use LUE during ADL completion.    Time 4   Period Weeks   Status Partially Met     OT SHORT TERM GOAL #5   Title Pt will improve LUE strength to 3/5 to increase ability to use LUE as assist during ADL and work tasks.   Time 4   Period Weeks           OT Long Term Goals - 11/22/16 1441      OT LONG TERM GOAL #1   Title Patient will use his left arm to reach to items at or slightly above shoulder height during ADL completion.     Time 8   Period Weeks   Status On-going     OT LONG TERM GOAL #2   Title Patient will improve left shoulder A/ROM to 100 degrees or better flexion and abduction and 20 degrees external rotation in order to use left arm with reaching and dressing tasks.    Time 8   Period Weeks   Status On-going     OT LONG TERM GOAL #3   Title Patient will improve left shoulder strength to 4-/5 in given range for increased ability to competing functional lifting activities.    Time 8   Period Weeks   Status On-going               Plan - 11/30/16 1535     Clinical Impression Statement A: Pt was able to complete finger ladder with 5" lift off to 9th peg. Also added therapy ball exercises supine and standing as able to complete.  Pt required constant cueing for form and technique such as keeping his back straight when reaching/lifting his LUE.   Plan P: Continue with therapy ball and finger ladder exercises. Continue to work on proper form and posture needing less cueing.      Patient will benefit from skilled therapeutic intervention in order to improve the following deficits and impairments:  Decreased activity tolerance, Decreased strength, Impaired flexibility, Decreased range of motion, Pain, Increased fascial restricitons, Impaired UE functional use  Visit Diagnosis: Stiffness of left shoulder, not elsewhere classified  Acute pain of left shoulder  Other symptoms and signs involving the musculoskeletal system    Problem List Patient Active Problem List   Diagnosis Date Noted  . Normal coronary arteries 2008 09/02/2016  . H/O renal cell cancer 09/02/2016  . Hypokalemia   . Preop cardiovascular exam 02/24/2014  . Apical variant hypertrophic cardiomyopathy ([place]) 02/24/2014  . Tobacco abuse 02/24/2014  . Essential hypertension 12/12/2008  . CHEST PAIN-UNSPECIFIED 12/12/2008  . ABNORMAL EKG 12/12/2008   Ailene Ravel, OTR/L,CBIS  [phone]  11/30/2016, 3:38 PM  [facility] [address] [place], [place], [address] Phone: [phone]   Fax:  [phone]  Name: [name] Foster MRN: [id number] Date of Birth: 12/17/1955

## 2016-12-02 ENCOUNTER — Encounter (HOSPITAL_COMMUNITY): Payer: Self-pay | Admitting: Occupational Therapy

## 2016-12-02 ENCOUNTER — Ambulatory Visit (HOSPITAL_COMMUNITY): Payer: Worker's Compensation | Attending: Orthopedic Surgery | Admitting: Occupational Therapy

## 2016-12-02 DIAGNOSIS — R29898 Other symptoms and signs involving the musculoskeletal system: Secondary | ICD-10-CM | POA: Insufficient documentation

## 2016-12-02 DIAGNOSIS — M25612 Stiffness of left shoulder, not elsewhere classified: Secondary | ICD-10-CM

## 2016-12-02 DIAGNOSIS — M25512 Pain in left shoulder: Secondary | ICD-10-CM | POA: Insufficient documentation

## 2016-12-02 NOTE — Therapy (Signed)
[place], [place], [address] Phone: [phone]   Fax:  [phone]  Occupational Therapy Treatment  Patient Details  Name: [name] Foster MRN: [id number] Date of Birth: 09-08-1956 Referring Provider: Dr. Tania Ade  Encounter Date: 12/02/2016      OT End of Session - 12/02/16 1542    Visit Number 21   Number of Visits 30   Date for OT Re-Evaluation 01/06/17  mini reassess on 12/08/16   Authorization Type Workers comp   Authorization Time Period Worker's Comp has approved 12 more visits on 11/18/16.   Authorization - Visit Number 12   Authorization - Number of Visits 24   OT Start Time 5625   OT Stop Time 1541   OT Time Calculation (min) 45 min   Activity Tolerance Patient tolerated treatment well   Behavior During Therapy WFL for tasks assessed/performed      Past Medical History:  Diagnosis Date  . Abnormal EKG   . Apical variant hypertrophic cardiomyopathy ([place])   . Benign essential hypertension   . BPH (benign prostatic hyperplasia)   . Hypokalemia   . Tobacco abuse     Past Surgical History:  Procedure Laterality Date  . CARDIAC CATHETERIZATION     2008  . COLONOSCOPY N/A 02/26/2015   Procedure: COLONOSCOPY;  Surgeon: Rogene Houston, MD;  [facility];  Service: Endoscopy;  Laterality: N/A;  830  . KIDNEY SURGERY Right April 2015   benign tumor removal  . [facility]OST[facility]ATION  12[facility]  . SHOULDER ACROMIOPLASTY Left 01/18/2016   Procedure: SHOULDER ACROMIOPLASTY;  Surgeon: Milly Jakob, MD;  Location: [facility];  Service: Orthopedics;  Laterality: Left;  . SHOULDER ARTHROSCOPY WITH ROTATOR CUFF REPAIR AND SUBACROMIAL DECOMPRESSION Left 01/18/2016   Procedure: LEFT SHOULDER ARTHROSCOPY WITH ROTATOR CUFF REPAIR AND SUBACROMIAL DECOMPRESSION;  Surgeon: Milly Jakob, MD;  Location: [facility];  Service: Orthopedics;  Laterality: Left;  PRE-OP BLOCK  WITH GENERAL ANESTHESIA    There were no vitals filed for this visit.      Subjective Assessment - 12/02/16 1455    Subjective  S: The right arm was hurting more than the left last night.    Currently in Pain? Yes   Pain Score 5    Pain Location Shoulder   Pain Orientation Left   Pain Descriptors / Indicators Aching   Pain Type Acute pain   Pain Radiating Towards n/a   Pain Onset More than a month ago   Pain Frequency Intermittent   Aggravating Factors  movement and cold   Pain Relieving Factors rest, positioning   Effect of Pain on Daily Activities max difficulty using LUE with functional tasks   Multiple Pain Sites No            [facility] OT Assessment - 12/02/16 1455      Assessment   Diagnosis Left capsular release s/p Left RCR     Precautions   Precautions None                  OT Treatments/Exercises (OP) - 12/02/16 1458      Exercises   Exercises Shoulder     Shoulder Exercises: Supine   Protraction PROM;5 reps;AROM;10 reps   Horizontal ABduction PROM;5 reps;AROM;10 reps   External Rotation PROM;5 reps;AROM;10 reps   Internal Rotation PROM;5 reps;AROM;10 reps   Flexion PROM;5 reps;AROM;10 reps   ABduction PROM;5 reps;AAROM;10 reps   ABduction Limitations --  Shoulder Exercises: Standing   Protraction AAROM;12 reps   Horizontal ABduction Weight (lbs) not completed due to RUE pain   External Rotation AROM;12 reps;Limitations   External Rotation Limitations slightly past neutral   Internal Rotation AROM;12 reps   Flexion AAROM;12 reps   Flexion Limitations 50% range   ABduction AAROM;12 reps   Extension Theraband;15 reps   Theraband Level (Shoulder Extension) Level 2 (Red)   Row Theraband;15 reps   Theraband Level (Shoulder Row) Level 2 (Red)     Shoulder Exercises: ROM/Strengthening   UBE (Upper Arm Bike) levle 2:  2 minutes forward and 2 minutes reverse, concentrating on depressing shoulder blade   Wall Wash 1'-focusing on flexion    Proximal Shoulder Strengthening, Supine 15X no rest breaks   Rhythmic Stabilization, Seated 25X at 45 degrees, mod difficulty   Other ROM/Strengthening Exercises Finger ladder completed. Patient was able to climb to peg #11 and take hand off for 5" before walking back down. Completed 5 times.      Manual Therapy   Manual Therapy Myofascial release   Manual therapy comments performed separately from all other skilled intervetnions    Myofascial Release myofascial release and manual stretching to left upper arm, scapular, and shoulder regions to decrease pain and restrictions and improve pain free mobility.  Added manual cervical traction and myofascial release to cervical region this date.                   OT Short Term Goals - 11/22/16 1441      OT SHORT TERM GOAL #1   Title Pt will be educated on HEP.    Time 4   Period Weeks     OT SHORT TERM GOAL #2   Title Pt will decrease pain in LUE to 4/10 or less to improve ability to complete daily tasks.   Time 4   Period Weeks   Status On-going     OT SHORT TERM GOAL #3   Title Pt will decrease LUE fascial restrictions to min amounts to improve mobility in LUE during functional tasks.    Time 4   Period Weeks     OT SHORT TERM GOAL #4   Title Pt will improve A/ROM of LUE by 50% to improve ability to use LUE during ADL completion.    Time 4   Period Weeks   Status Partially Met     OT SHORT TERM GOAL #5   Title Pt will improve LUE strength to 3/5 to increase ability to use LUE as assist during ADL and work tasks.   Time 4   Period Weeks           OT Long Term Goals - 11/22/16 1441      OT LONG TERM GOAL #1   Title Patient will use his left arm to reach to items at or slightly above shoulder height during ADL completion.     Time 8   Period Weeks   Status On-going     OT LONG TERM GOAL #2   Title Patient will improve left shoulder A/ROM to 100 degrees or better flexion and abduction and 20 degrees external  rotation in order to use left arm with reaching and dressing tasks.    Time 8   Period Weeks   Status On-going     OT LONG TERM GOAL #3   Title Patient will improve left shoulder strength to 4-/5 in given range for increased ability to competing functional lifting  activities.    Time 8   Period Weeks   Status On-going               Plan - 12/02/16 1542    Clinical Impression Statement A: Pt completed A/ROM and AA/ROM exercises with focus on proper form/posture this session. Continues to require cuing for correct form with standing exercises. Did not completed therapy ball exercises due to RUE pain this session. Pt able to reach 11th peg with finger ladder today compared to 9th last session, cuing for keeping back straight. Pt has MD appt on Wed 2/7, will inform office if MD makes any changes to therapy orders.    Plan P: Measure for MD appt. Continue with therapy ball exercises if pt able to tolerate.    OT Home Exercise Plan 11/13: table slides 09/14/16:  elevation and row.  09/21/16:  discontinue dowel rod exercises.   Consulted and Agree with Plan of Care Patient      Patient will benefit from skilled therapeutic intervention in order to improve the following deficits and impairments:  Decreased activity tolerance, Decreased strength, Impaired flexibility, Decreased range of motion, Pain, Increased fascial restricitons, Impaired UE functional use  Visit Diagnosis: Stiffness of left shoulder, not elsewhere classified  Acute pain of left shoulder  Other symptoms and signs involving the musculoskeletal system    Problem List Patient Active Problem List   Diagnosis Date Noted  . Normal coronary arteries 2008 09/02/2016  . H/O renal cell cancer 09/02/2016  . Hypokalemia   . Preop cardiovascular exam 02/24/2014  . Apical variant hypertrophic cardiomyopathy ([place]) 02/24/2014  . Tobacco abuse 02/24/2014  . Essential hypertension 12/12/2008  . CHEST PAIN-UNSPECIFIED  12/12/2008  . ABNORMAL EKG 12/12/2008   Guadelupe Sabin, OTR/L  [phone] 12/02/2016, 3:45 PM  [facility] [address] [place], [place], [address] Phone: [phone]   Fax:  [phone]  Name: [name] Foster MRN: [id number] Date of Birth: 02/09/1956

## 2016-12-05 ENCOUNTER — Ambulatory Visit (HOSPITAL_COMMUNITY): Payer: Worker's Compensation | Admitting: Specialist

## 2016-12-05 DIAGNOSIS — M25512 Pain in left shoulder: Secondary | ICD-10-CM

## 2016-12-05 DIAGNOSIS — M25612 Stiffness of left shoulder, not elsewhere classified: Secondary | ICD-10-CM

## 2016-12-05 DIAGNOSIS — R29898 Other symptoms and signs involving the musculoskeletal system: Secondary | ICD-10-CM

## 2016-12-05 NOTE — Therapy (Signed)
[place], [place], [address] Phone: [phone]   Fax:  [phone]  Occupational Therapy Treatment  Patient Details  Name: [name] Foster MRN: [id number] Date of Birth: Apr 14, 1956 Referring Provider: Dr. Tania Ade  Encounter Date: 12/05/2016      OT End of Session - 12/05/16 1542    Visit Number 22   Number of Visits 30   Date for OT Re-Evaluation 01/06/17   Authorization Type Workers comp   Authorization Time Period Worker's Comp has approved 12 more visits on 11/18/16.   Authorization - Visit Number 13   Authorization - Number of Visits 24   OT Start Time 1435   OT Stop Time 1516   OT Time Calculation (min) 41 min   Activity Tolerance Patient tolerated treatment well   Behavior During Therapy WFL for tasks assessed/performed      Past Medical History:  Diagnosis Date  . Abnormal EKG   . Apical variant hypertrophic cardiomyopathy ([place])   . Benign essential hypertension   . BPH (benign prostatic hyperplasia)   . Hypokalemia   . Tobacco abuse     Past Surgical History:  Procedure Laterality Date  . CARDIAC CATHETERIZATION     2008  . COLONOSCOPY N/A 02/26/2015   Procedure: COLONOSCOPY;  Surgeon: Rogene Houston, MD;  [facility];  Service: Endoscopy;  Laterality: N/A;  830  . KIDNEY SURGERY Right April 2015   benign tumor removal  . PROSTATE ABLATION  12-2015 at [facility]  . SHOULDER ACROMIOPLASTY Left 01/18/2016   Procedure: SHOULDER ACROMIOPLASTY;  Surgeon: Milly Jakob, MD;  Location: [facility];  Service: Orthopedics;  Laterality: Left;  . SHOULDER ARTHROSCOPY WITH ROTATOR CUFF REPAIR AND SUBACROMIAL DECOMPRESSION Left 01/18/2016   Procedure: LEFT SHOULDER ARTHROSCOPY WITH ROTATOR CUFF REPAIR AND SUBACROMIAL DECOMPRESSION;  Surgeon: Milly Jakob, MD;  Location: [facility];  Service: Orthopedics;  Laterality: Left;  PRE-OP BLOCK WITH GENERAL ANESTHESIA     There were no vitals filed for this visit.      Subjective Assessment - 12/05/16 1541    Subjective  S:  I can use my arm at waist height, but that's about it.   Currently in Pain? Yes   Pain Score 5    Pain Location Shoulder   Pain Orientation Left   Pain Descriptors / Indicators Aching   Pain Type Acute pain            [facility] OT Assessment - 12/05/16 0001      Assessment   Diagnosis Left capsular release s/p Left RCR   Referring Provider Dr. 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, mowing, Holiday representative, etc   Leisure spending time with his son, cooking     ADL   ADL comments Patient states that waist height activities are approximately 5% easier to complete.  However, any activity above waist height is difficult due to increased pain and lack of shoulder movement.  Patient would not be able to complete necessary work activities at this time.      AROM   Overall AROM Comments Assessed seated, er/IR adducted   AROM Assessment Site Shoulder   Right/Left Shoulder Left   Left Shoulder Flexion 67 Degrees  75   Left Shoulder ABduction 67 Degrees  65  Left Shoulder Internal Rotation 90 Degrees  90   Left Shoulder External Rotation 7 Degrees  0     PROM   Overall PROM Comments Assessed supine, er/IR adducted   PROM Assessment Site Shoulder   Right/Left Shoulder Left   Left Shoulder Flexion 121 Degrees  135   Left Shoulder ABduction 137 Degrees  125   Left Shoulder Internal Rotation 90 Degrees  90   Left Shoulder External Rotation 36 Degrees  35     Strength   Overall Strength Comments in given range, range not [facility]   Strength Assessment Site Shoulder   Right/Left Shoulder Left   Left Shoulder Flexion 3+/5  3+/5   Left Shoulder ABduction 4-/5  3+/5   Left Shoulder Internal Rotation 4-/5  3+/5   Left  Shoulder External Rotation 3+/5  3+/5                  OT Treatments/Exercises (OP) - 12/05/16 0001      Exercises   Exercises Shoulder     Shoulder Exercises: Supine   Protraction PROM;5 reps   Horizontal ABduction PROM;5 reps   External Rotation PROM;5 reps   Internal Rotation PROM;5 reps   Flexion PROM;5 reps   ABduction PROM;5 reps     Shoulder Exercises: Standing   Extension Theraband;15 reps   Theraband Level (Shoulder Extension) Level 2 (Red)   Row Theraband;15 reps   Theraband Level (Shoulder Row) Level 2 (Red)     Shoulder Exercises: ROM/Strengthening   UBE (Upper Arm Bike) levle 2:  2 minutes forward and 2 minutes reverse, concentrating on depressing shoulder blade   Over Head Lace 2' to mid waist height   Wall Pushups 20 reps   Proximal Shoulder Strengthening, Supine 10X each with one rest break   Rhythmic Stabilization, Seated 15" at 45, 70, 95 with mod difficulty maintaining position   Other ROM/Strengthening Exercises Finger ladder completed. Patient was able to climb to peg #12 and take hand off for 5" before walking back down. Completed 5 times.      Manual Therapy   Manual Therapy Myofascial release   Manual therapy comments performed separately from all other skilled intervetnions    Myofascial Release myofascial release and manual stretching to left upper arm, scapular, and shoulder regions to decrease pain and restrictions and improve pain free mobility.  Added manual cervical traction and myofascial release to cervical region this date.                   OT Short Term Goals - 11/22/16 1441      OT SHORT TERM GOAL #1   Title Pt will be educated on HEP.    Time 4   Period Weeks     OT SHORT TERM GOAL #2   Title Pt will decrease pain in LUE to 4/10 or less to improve ability to complete daily tasks.   Time 4   Period Weeks   Status On-going     OT SHORT TERM GOAL #3   Title Pt will decrease LUE fascial restrictions to min  amounts to improve mobility in LUE during functional tasks.    Time 4   Period Weeks     OT SHORT TERM GOAL #4   Title Pt will improve A/ROM of LUE by 50% to improve ability to use LUE during ADL completion.    Time 4   Period Weeks   Status Partially Met     OT SHORT TERM  GOAL #5   Title Pt will improve LUE strength to 3/5 to increase ability to use LUE as assist during ADL and work tasks.   Time 4   Period Weeks           OT Long Term Goals - 11/22/16 1441      OT LONG TERM GOAL #1   Title Patient will use his left arm to reach to items at or slightly above shoulder height during ADL completion.     Time 8   Period Weeks   Status On-going     OT LONG TERM GOAL #2   Title Patient will improve left shoulder A/ROM to 100 degrees or better flexion and abduction and 20 degrees external rotation in order to use left arm with reaching and dressing tasks.    Time 8   Period Weeks   Status On-going     OT LONG TERM GOAL #3   Title Patient will improve left shoulder strength to 4-/5 in given range for increased ability to competing functional lifting activities.    Time 8   Period Weeks   Status On-going               Plan - 12/05/16 1543    Clinical Impression Statement A:  Patient has made minimal gains in A/ROM and P/ROM abduction and external rotation, with flexion actually decreasing in passive and active range.  Patient continues to have max difficulty with any functional activity above waist height due to increased pain and inability to range his arm.   OT Frequency 2x / week   OT Duration 4 weeks   OT Treatment/Interventions Self-care/ADL training;Therapeutic exercise;Patient/family education;Manual Therapy;Cryotherapy;Therapeutic activities;Electrical Stimulation;Moist Heat;Passive range of motion   Plan P: Await MD order regarding continuation of therapy, if MD recommends continuing focus on proximal shoulder strengthening and functional reaching in given  range.    Consulted and Agree with Plan of Care Patient      Patient will benefit from skilled therapeutic intervention in order to improve the following deficits and impairments:  Decreased activity tolerance, Decreased strength, Impaired flexibility, Decreased range of motion, Pain, Increased fascial restricitons, Impaired UE functional use  Visit Diagnosis: Stiffness of left shoulder, not elsewhere classified  Acute pain of left shoulder  Other symptoms and signs involving the musculoskeletal system    Problem List Patient Active Problem List   Diagnosis Date Noted  . Normal coronary arteries 2008 09/02/2016  . H/O renal cell cancer 09/02/2016  . Hypokalemia   . Preop cardiovascular exam 02/24/2014  . Apical variant hypertrophic cardiomyopathy ([place]) 02/24/2014  . Tobacco abuse 02/24/2014  . Essential hypertension 12/12/2008  . CHEST PAIN-UNSPECIFIED 12/12/2008  . ABNORMAL EKG 12/12/2008    Vangie Bicker, [place], OTR/L [phone]  12/05/2016, 3:59 PM  [facility] [address] [place], [place], [address] Phone: [phone]   Fax:  [phone]  Name: Lacorey Brusca MRN: [id number] Date of Birth: 1955-12-15

## 2016-12-07 ENCOUNTER — Ambulatory Visit (HOSPITAL_COMMUNITY): Payer: Worker's Compensation | Admitting: Occupational Therapy

## 2016-12-09 ENCOUNTER — Encounter (HOSPITAL_COMMUNITY): Payer: Self-pay | Admitting: Occupational Therapy

## 2016-12-09 ENCOUNTER — Ambulatory Visit (HOSPITAL_COMMUNITY): Payer: Worker's Compensation | Admitting: Occupational Therapy

## 2016-12-09 DIAGNOSIS — M25612 Stiffness of left shoulder, not elsewhere classified: Secondary | ICD-10-CM | POA: Diagnosis not present

## 2016-12-09 DIAGNOSIS — R29898 Other symptoms and signs involving the musculoskeletal system: Secondary | ICD-10-CM

## 2016-12-09 DIAGNOSIS — M25512 Pain in left shoulder: Secondary | ICD-10-CM

## 2016-12-09 NOTE — Therapy (Signed)
[place], [place], [address] Phone: [phone]   Fax:  [phone]  Occupational Therapy Treatment  Patient Details  Name: [name] Foster MRN: [id number] Date of Birth: 03-Nov-1955 Referring Provider: Dr. Tania Ade  Encounter Date: 12/09/2016      OT End of Session - 12/09/16 1632    Visit Number 23   Number of Visits 30   Date for OT Re-Evaluation 01/06/17   Authorization Type Workers comp   Authorization Time Period Worker's Comp has approved 12 more visits on 12/08/2016   Authorization - Visit Number 14   Authorization - Number of Visits 26   OT Start Time 1516   OT Stop Time 1615   OT Time Calculation (min) 59 min   Activity Tolerance Patient tolerated treatment well   Behavior During Therapy [facility] for tasks assessed/performed      Past Medical History:  Diagnosis Date  . Abnormal EKG   . Apical variant hypertrophic cardiomyopathy ([place])   . Benign essential hypertension   . BPH (benign prostatic hyperplasia)   . Hypokalemia   . Tobacco abuse     Past Surgical History:  Procedure Laterality Date  . CARDIAC CATHETERIZATION     2008  . COLONOSCOPY N/A 02/26/2015   Procedure: COLONOSCOPY;  Surgeon: Rogene Houston, MD;  [facility];  Service: Endoscopy;  Laterality: N/A;  830  . KIDNEY SURGERY Right April 2015   benign tumor removal  . [facility]  [facility]  . SHOULDER ACROMIOPLASTY Left 01/18/2016   Procedure: SHOULDER ACROMIOPLASTY;  Surgeon: Milly Jakob, MD;  Location: [place];  Service: Orthopedics;  Laterality: Left;  . SHOULDER ARTHROSCOPY WITH ROTATOR CUFF REPAIR AND SUBACROMIAL DECOMPRESSION Left 01/18/2016   Procedure: LEFT SHOULDER ARTHROSCOPY WITH ROTATOR CUFF REPAIR AND SUBACROMIAL DECOMPRESSION;  Surgeon: Milly Jakob, MD;  Location: [place];  Service: Orthopedics;  Laterality: Left;  PRE-OP BLOCK WITH GENERAL ANESTHESIA     There were no vitals filed for this visit.      Subjective Assessment - 12/09/16 1516    Subjective  S: The doctor said he thinks I'm doing better.    Currently in Pain? Yes   Pain Score 5    Pain Location Shoulder   Pain Orientation Left   Pain Descriptors / Indicators Aching   Pain Type Acute pain   Pain Radiating Towards n/a   Pain Onset More than a month ago   Pain Frequency Intermittent   Aggravating Factors  movement and cold   Pain Relieving Factors rest, positioning   Effect of Pain on Daily Activities max difficulty with using LUE during daily tasks   Multiple Pain Sites No            [facility] OT Assessment - 12/09/16 1516      Assessment   Diagnosis Left capsular release s/p Left RCR     Precautions   Precautions None                  OT Treatments/Exercises (OP) - 12/09/16 1521      Exercises   Exercises Shoulder     Shoulder Exercises: Supine   Protraction PROM;5 reps   Horizontal ABduction PROM;5 reps   External Rotation PROM;5 reps   Internal Rotation PROM;5 reps   Flexion PROM;5 reps   ABduction PROM;5 reps     Shoulder Exercises: Sidelying   External Rotation AROM;12 reps   External Rotation  Limitations to neutral   Internal Rotation AROM;12 reps   Flexion AROM;Limitations;12 reps   Flexion Limitations 50% range   ABduction AROM;Limitations;12 reps   ABduction Limitations 50% range   Other Sidelying Exercises protraction, 12X, A/ROM     Shoulder Exercises: Standing   Protraction Theraband;10 reps   Theraband Level (Shoulder Protraction) Level 2 (Red)   External Rotation Theraband;10 reps   Theraband Level (Shoulder External Rotation) Level 2 (Red)   Internal Rotation Theraband;10 reps   Theraband Level (Shoulder Internal Rotation) Level 2 (Red)   Extension Theraband;15 reps   Theraband Level (Shoulder Extension) Level 2 (Red)   Row Theraband;15 reps   Theraband Level (Shoulder Row) Level 2 (Red)     Shoulder Exercises:  ROM/Strengthening   Proximal Shoulder Strengthening, Supine 10X each with one rest break   Rhythmic Stabilization, Seated 15" at 45, 70, 95 with mod difficulty maintaining position   Other ROM/Strengthening Exercises Finger ladder completed. Patient was able to climb to peg #12 and take hand off for 5" before walking back down. Completed 5 times.      Shoulder Exercises: Stretch   Wall Stretch - Flexion 3 reps;10 seconds     Functional Reaching Activities   Mid Level Pt placed and removed 10 cones from middle shelf of cabinet. Mod difficulty. Pt completed pinch tree, placing 18 pins along vertical pole.      Modalities   Modalities Electrical Stimulation;Moist Heat     Moist Heat Therapy   Number Minutes Moist Heat 10 Minutes   Moist Heat Location Shoulder     Electrical Stimulation   Electrical Stimulation Location Left shoulder   Electrical Stimulation Action interferential   Electrical Stimulation Parameters 8.8CV   Electrical Stimulation Goals Pain     Manual Therapy   Manual Therapy Myofascial release   Manual therapy comments performed separately from all other skilled intervetnions    Myofascial Release myofascial release and manual stretching to left upper arm, scapular, and shoulder regions to decrease pain and restrictions and improve pain free mobility.  Added manual cervical traction and myofascial release to cervical region this date.                   OT Short Term Goals - 11/22/16 1441      OT SHORT TERM GOAL #1   Title Pt will be educated on HEP.    Time 4   Period Weeks     OT SHORT TERM GOAL #2   Title Pt will decrease pain in LUE to 4/10 or less to improve ability to complete daily tasks.   Time 4   Period Weeks   Status On-going     OT SHORT TERM GOAL #3   Title Pt will decrease LUE fascial restrictions to min amounts to improve mobility in LUE during functional tasks.    Time 4   Period Weeks     OT SHORT TERM GOAL #4   Title Pt will  improve A/ROM of LUE by 50% to improve ability to use LUE during ADL completion.    Time 4   Period Weeks   Status Partially Met     OT SHORT TERM GOAL #5   Title Pt will improve LUE strength to 3/5 to increase ability to use LUE as assist during ADL and work tasks.   Time 4   Period Weeks           OT Long Term Goals - 11/22/16 1441  OT LONG TERM GOAL #1   Title Patient will use his left arm to reach to items at or slightly above shoulder height during ADL completion.     Time 8   Period Weeks   Status On-going     OT LONG TERM GOAL #2   Title Patient will improve left shoulder A/ROM to 100 degrees or better flexion and abduction and 20 degrees external rotation in order to use left arm with reaching and dressing tasks.    Time 8   Period Weeks   Status On-going     OT LONG TERM GOAL #3   Title Patient will improve left shoulder strength to 4-/5 in given range for increased ability to competing functional lifting activities.    Time 8   Period Weeks   Status On-going               Plan - 12/09/16 1633    Clinical Impression Statement A: Pt reports MD stated he is improving and wants him to continue with therapy at this time. Session today focused on strengthening the LUE within available range and functional reaching tasks. Added theraband protraction, er, IR this session. ES at end of session for pain management, pt reports pain as 4/10 at end of session.    Plan P: Focus on strengthening within available range, proximal shoulder strengthening, scapular stability and strengthening.    OT Home Exercise Plan 11/13: table slides 09/14/16:  elevation and row.  09/21/16:  discontinue dowel rod exercises.   Consulted and Agree with Plan of Care Patient      Patient will benefit from skilled therapeutic intervention in order to improve the following deficits and impairments:  Decreased activity tolerance, Decreased strength, Impaired flexibility, Decreased range of  motion, Pain, Increased fascial restricitons, Impaired UE functional use  Visit Diagnosis: Stiffness of left shoulder, not elsewhere classified  Acute pain of left shoulder  Other symptoms and signs involving the musculoskeletal system    Problem List Patient Active Problem List   Diagnosis Date Noted  . Normal coronary arteries 2008 09/02/2016  . H/O renal cell cancer 09/02/2016  . Hypokalemia   . Preop cardiovascular exam 02/24/2014  . Apical variant hypertrophic cardiomyopathy ([place]) 02/24/2014  . Tobacco abuse 02/24/2014  . Essential hypertension 12/12/2008  . CHEST PAIN-UNSPECIFIED 12/12/2008  . ABNORMAL EKG 12/12/2008   Guadelupe Sabin, OTR/L  [phone] 12/09/2016, 4:35 PM  [facility] [address] [place], [place], [address] Phone: [phone]   Fax:  [phone]  Name: [name] Foster MRN: [id number] Date of Birth: 1956/09/07

## 2016-12-12 ENCOUNTER — Ambulatory Visit (HOSPITAL_COMMUNITY): Payer: Worker's Compensation | Admitting: Specialist

## 2016-12-12 DIAGNOSIS — M25512 Pain in left shoulder: Secondary | ICD-10-CM

## 2016-12-12 DIAGNOSIS — M25612 Stiffness of left shoulder, not elsewhere classified: Secondary | ICD-10-CM

## 2016-12-12 DIAGNOSIS — R29898 Other symptoms and signs involving the musculoskeletal system: Secondary | ICD-10-CM

## 2016-12-12 NOTE — Therapy (Signed)
[place], [place], [address] Phone: [phone]   Fax:  [phone]  Occupational Therapy Treatment  Patient Details  Name: [name] Foster MRN: [id number] Date of Birth: 1956-09-05 Referring Provider: Dr. Tania Ade  Encounter Date: 12/12/2016      OT End of Session - 12/12/16 1508    Visit Number 24   Number of Visits 30   Date for OT Re-Evaluation 01/06/17   Authorization Type Workers comp   Authorization Time Period Worker's Comp has approved 12 more visits on 12/08/2016   Authorization - Visit Number 15   Authorization - Number of Visits 26   OT Start Time 1430   OT Stop Time 1510   OT Time Calculation (min) 40 min   Activity Tolerance Patient tolerated treatment well   Behavior During Therapy [facility] for tasks assessed/performed      Past Medical History:  Diagnosis Date  . Abnormal EKG   . Apical variant hypertrophic cardiomyopathy ([place])   . Benign essential hypertension   . BPH (benign prostatic hyperplasia)   . Hypokalemia   . Tobacco abuse     Past Surgical History:  Procedure Laterality Date  . CARDIAC CATHETERIZATION     2008  . COLONOSCOPY N/A 02/26/2015   Procedure: COLONOSCOPY;  Surgeon: Rogene Houston, MD;  [facility];  Service: Endoscopy;  Laterality: N/A;  830  . KIDNEY SURGERY Right April 2015   benign tumor removal  . PROSTATE ABLATION  12-2015 at [facility]  . SHOULDER ACROMIOPLASTY Left 01/18/2016   Procedure: SHOULDER ACROMIOPLASTY;  Surgeon: Milly Jakob, MD;  Location: [place];  Service: Orthopedics;  Laterality: Left;  . SHOULDER ARTHROSCOPY WITH ROTATOR CUFF REPAIR AND SUBACROMIAL DECOMPRESSION Left 01/18/2016   Procedure: LEFT SHOULDER ARTHROSCOPY WITH ROTATOR CUFF REPAIR AND SUBACROMIAL DECOMPRESSION;  Surgeon: Milly Jakob, MD;  Location: [place];  Service: Orthopedics;  Laterality: Left;  PRE-OP BLOCK WITH GENERAL ANESTHESIA     There were no vitals filed for this visit.      Subjective Assessment - 12/12/16 1507    Subjective  S:  The electrodes (interferential) helped my pain for most of the day.    Currently in Pain? Yes   Pain Score 5    Pain Location Shoulder   Pain Orientation Left   Pain Descriptors / Indicators Aching   Pain Type Acute pain            [facility] OT Assessment - 12/12/16 0001      Assessment   Diagnosis Left capsular release s/p Left RCR     Precautions   Precautions None                  OT Treatments/Exercises (OP) - 12/12/16 0001      Exercises   Exercises Shoulder     Shoulder Exercises: Supine   Protraction Strengthening;10 reps   Protraction Weight (lbs) 1   Horizontal ABduction Strengthening;10 reps   Horizontal ABduction Weight (lbs) 1   External Rotation Strengthening;10 reps   External Rotation Weight (lbs) 1   Internal Rotation Strengthening;10 reps   Internal Rotation Weight (lbs) 1   Flexion Strengthening;10 reps   Shoulder Flexion Weight (lbs) 1   ABduction Strengthening;10 reps   Shoulder ABduction Weight (lbs) 1     Shoulder Exercises: ROM/Strengthening   Rhythmic Stabilization, Seated 15" times 2 sets with 1# weight in hand     Moist Heat Therapy  Number Minutes Moist Heat 15 Minutes   Moist Heat Location Shoulder     Electrical Stimulation   Electrical Stimulation Location Left shoulder   Electrical Stimulation Action interferential   Electrical Stimulation Parameters 5.4 CV   Electrical Stimulation Goals Pain     Manual Therapy   Manual Therapy Myofascial release   Manual therapy comments performed separately from all other skilled intervetnions    Myofascial Release myofascial release and manual stretching to left upper arm, scapular, and shoulder regions to decrease pain and restrictions and improve pain free mobility.  manual cervical traction and myofascial release to cervical region                   OT  Short Term Goals - 11/22/16 1441      OT SHORT TERM GOAL #1   Title Pt will be educated on HEP.    Time 4   Period Weeks     OT SHORT TERM GOAL #2   Title Pt will decrease pain in LUE to 4/10 or less to improve ability to complete daily tasks.   Time 4   Period Weeks   Status On-going     OT SHORT TERM GOAL #3   Title Pt will decrease LUE fascial restrictions to min amounts to improve mobility in LUE during functional tasks.    Time 4   Period Weeks     OT SHORT TERM GOAL #4   Title Pt will improve A/ROM of LUE by 50% to improve ability to use LUE during ADL completion.    Time 4   Period Weeks   Status Partially Met     OT SHORT TERM GOAL #5   Title Pt will improve LUE strength to 3/5 to increase ability to use LUE as assist during ADL and work tasks.   Time 4   Period Weeks           OT Long Term Goals - 11/22/16 1441      OT LONG TERM GOAL #1   Title Patient will use his left arm to reach to items at or slightly above shoulder height during ADL completion.     Time 8   Period Weeks   Status On-going     OT LONG TERM GOAL #2   Title Patient will improve left shoulder A/ROM to 100 degrees or better flexion and abduction and 20 degrees external rotation in order to use left arm with reaching and dressing tasks.    Time 8   Period Weeks   Status On-going     OT LONG TERM GOAL #3   Title Patient will improve left shoulder strength to 4-/5 in given range for increased ability to competing functional lifting activities.    Time 8   Period Weeks   Status On-going               Plan - 12/12/16 1509    Clinical Impression Statement A:  discussed treatment focus switch with patient, and patient is in agreeance.  we will focus on improving strength and functional use in given range vs improving range at this time.  continued with IFES and heat for pain control.    OT Frequency 2x / week   OT Duration 4 weeks   OT Treatment/Interventions Self-care/ADL  training;Therapeutic exercise;Patient/family education;Manual Therapy;Cryotherapy;Therapeutic activities;Electrical Stimulation;Moist Heat;Passive range of motion   Plan P:  Attempt sidelying strengthening with 1# resistance, reaching from floor to waist height functional task.  Patient will benefit from skilled therapeutic intervention in order to improve the following deficits and impairments:  Decreased activity tolerance, Decreased strength, Impaired flexibility, Decreased range of motion, Pain, Increased fascial restricitons, Impaired UE functional use  Visit Diagnosis: Stiffness of left shoulder, not elsewhere classified  Acute pain of left shoulder  Other symptoms and signs involving the musculoskeletal system    Problem List Patient Active Problem List   Diagnosis Date Noted  . Normal coronary arteries 2008 09/02/2016  . H/O renal cell cancer 09/02/2016  . Hypokalemia   . Preop cardiovascular exam 02/24/2014  . Apical variant hypertrophic cardiomyopathy ([place]) 02/24/2014  . Tobacco abuse 02/24/2014  . Essential hypertension 12/12/2008  . CHEST PAIN-UNSPECIFIED 12/12/2008  . ABNORMAL EKG 12/12/2008    Vangie Bicker, [place], OTR/L [phone]  12/12/2016, 3:14 PM  [facility] [address] [place], [place], [address] Phone: [phone]   Fax:  [phone]  Name: [name] Foster MRN: [id number] Date of Birth: 22-Apr-1956

## 2016-12-14 ENCOUNTER — Ambulatory Visit (HOSPITAL_COMMUNITY): Payer: Worker's Compensation | Admitting: Occupational Therapy

## 2016-12-14 ENCOUNTER — Encounter (HOSPITAL_COMMUNITY): Payer: Self-pay | Admitting: Occupational Therapy

## 2016-12-14 DIAGNOSIS — M25612 Stiffness of left shoulder, not elsewhere classified: Secondary | ICD-10-CM

## 2016-12-14 DIAGNOSIS — R29898 Other symptoms and signs involving the musculoskeletal system: Secondary | ICD-10-CM

## 2016-12-14 DIAGNOSIS — M25512 Pain in left shoulder: Secondary | ICD-10-CM

## 2016-12-14 NOTE — Patient Instructions (Signed)
Complete 1-2x/day  1) (Home) Retraction: Row - Bilateral (Anchor)   Facing anchor, arms reaching forward, pull hands toward stomach, keeping elbows bent and at your sides and pinching shoulder blades together. Repeat 10-15 times.  Copyright  [employer]. All rights reserved.   2) (Clinic) Extension / Flexion (Assist)   Face anchor, pull arms back, keeping elbow straight, and squeze shoulder blades together. Repeat 10-15 times.   3) Shoulder Internal Rotation  While holding an elastic band at your side with your elbow bent, start with your hand away from your stomach, then pull the band towards your stomach. Keep your elbow near your side the entire time.     4) Shoulder External Rotation  While holding an elastic band at your side with your elbow bent, start with your hand near your stomach and then pull the band away. Keep your elbow at your side the entire time.

## 2016-12-14 NOTE — Therapy (Signed)
[place], [place], [address] Phone: [phone]   Fax:  [phone]  Occupational Therapy Treatment  Patient Details  Name: [name] Foster MRN: [id number] Date of Birth: 1956/05/13 Referring Provider: Dr. Tania Ade  Encounter Date: 12/14/2016      OT End of Session - 12/14/16 1558    Visit Number 25   Number of Visits 30   Date for OT Re-Evaluation 01/06/17   Authorization Type Workers comp   Authorization Time Period Worker's Comp has approved 12 more visits on 12/08/2016   Authorization - Visit Number 41   Authorization - Number of Visits 26   OT Start Time 1522   OT Stop Time 1605   OT Time Calculation (min) 43 min   Activity Tolerance Patient tolerated treatment well   Behavior During Therapy [facility] for tasks assessed/performed      Past Medical History:  Diagnosis Date  . Abnormal EKG   . Apical variant hypertrophic cardiomyopathy ([place])   . Benign essential hypertension   . BPH (benign prostatic hyperplasia)   . Hypokalemia   . Tobacco abuse     Past Surgical History:  Procedure Laterality Date  . CARDIAC CATHETERIZATION     2008  . COLONOSCOPY N/A 02/26/2015   Procedure: COLONOSCOPY;  Surgeon: Rogene Houston, MD;  [facility];  Service: Endoscopy;  Laterality: N/A;  830  . KIDNEY SURGERY Right April 2015   benign tumor removal  . PROSTATE ABLATION  12-2015 at [facility]  . SHOULDER ACROMIOPLASTY Left 01/18/2016   Procedure: SHOULDER ACROMIOPLASTY;  Surgeon: Milly Jakob, MD;  Location: [place];  Service: Orthopedics;  Laterality: Left;  . SHOULDER ARTHROSCOPY WITH ROTATOR CUFF REPAIR AND SUBACROMIAL DECOMPRESSION Left 01/18/2016   Procedure: LEFT SHOULDER ARTHROSCOPY WITH ROTATOR CUFF REPAIR AND SUBACROMIAL DECOMPRESSION;  Surgeon: Milly Jakob, MD;  Location: [place];  Service: Orthopedics;  Laterality: Left;  PRE-OP BLOCK WITH GENERAL ANESTHESIA     There were no vitals filed for this visit.      Subjective Assessment - 12/14/16 1518    Subjective  S: Last night was a rough night.    Currently in Pain? Yes   Pain Score 5    Pain Location Shoulder   Pain Orientation Left   Pain Descriptors / Indicators Aching   Pain Type Acute pain   Pain Radiating Towards n/a   Pain Onset More than a month ago   Pain Frequency Intermittent   Aggravating Factors  movement and cold   Pain Relieving Factors rest, positioning, heat   Effect of Pain on Daily Activities max difficulty during ADL completion   Multiple Pain Sites No            [facility] OT Assessment - 12/14/16 1518      Assessment   Diagnosis Left capsular release s/p Left RCR     Precautions   Precautions None                  OT Treatments/Exercises (OP) - 12/14/16 1524      Exercises   Exercises Shoulder     Shoulder Exercises: Supine   Protraction Strengthening;10 reps   Protraction Weight (lbs) 1   Horizontal ABduction Strengthening;10 reps   Horizontal ABduction Weight (lbs) 1   External Rotation Strengthening;10 reps   External Rotation Weight (lbs) 1   Internal Rotation Strengthening;10 reps   Internal Rotation Weight (lbs) 1   Flexion Strengthening;10  reps   Shoulder Flexion Weight (lbs) 1   ABduction Strengthening;10 reps   Shoulder ABduction Weight (lbs) 1     Shoulder Exercises: Sidelying   External Rotation Strengthening;10 reps   External Rotation Weight (lbs) 1   Internal Rotation Strengthening;10 reps   Internal Rotation Weight (lbs) 1   Flexion Strengthening;10 reps   Flexion Weight (lbs) 1   ABduction Strengthening;10 reps   ABduction Weight (lbs) 1   Other Sidelying Exercises protraction, 10X, 1# weight     Shoulder Exercises: Standing   External Rotation Theraband;10 reps   Theraband Level (Shoulder External Rotation) Level 2 (Red)   Internal Rotation Theraband;10 reps   Theraband Level (Shoulder Internal Rotation)  Level 2 (Red)   Extension Theraband;15 reps   Theraband Level (Shoulder Extension) Level 2 (Red)   Row Theraband;15 reps   Theraband Level (Shoulder Row) Level 2 (Red)     Modalities   Modalities Electrical Stimulation;Moist Heat     Moist Heat Therapy   Number Minutes Moist Heat 15 Minutes   Moist Heat Location Shoulder     Electrical Stimulation   Electrical Stimulation Location Left shoulder   Electrical Stimulation Action interferential   Electrical Stimulation Parameters 6.0 CV   Electrical Stimulation Goals Pain     Manual Therapy   Manual Therapy Myofascial release   Manual therapy comments performed separately from all other skilled intervetnions    Myofascial Release myofascial release and manual stretching to left upper arm, scapular, and shoulder regions to decrease pain and restrictions and improve pain free mobility.  manual cervical traction and myofascial release to cervical region                 OT Education - 12/14/16 1557    Education provided Yes   Education Details scapular theraband-row, extension, er, IR   Person(s) Educated Patient   Methods Explanation;Demonstration;Handout   Comprehension Verbalized understanding;Returned demonstration          OT Short Term Goals - 11/22/16 1441      OT SHORT TERM GOAL #1   Title Pt will be educated on HEP.    Time 4   Period Weeks     OT SHORT TERM GOAL #2   Title Pt will decrease pain in LUE to 4/10 or less to improve ability to complete daily tasks.   Time 4   Period Weeks   Status On-going     OT SHORT TERM GOAL #3   Title Pt will decrease LUE fascial restrictions to min amounts to improve mobility in LUE during functional tasks.    Time 4   Period Weeks     OT SHORT TERM GOAL #4   Title Pt will improve A/ROM of LUE by 50% to improve ability to use LUE during ADL completion.    Time 4   Period Weeks   Status Partially Met     OT SHORT TERM GOAL #5   Title Pt will improve LUE strength  to 3/5 to increase ability to use LUE as assist during ADL and work tasks.   Time 4   Period Weeks           OT Long Term Goals - 11/22/16 1441      OT LONG TERM GOAL #1   Title Patient will use his left arm to reach to items at or slightly above shoulder height during ADL completion.     Time 8   Period Weeks   Status On-going  OT LONG TERM GOAL #2   Title Patient will improve left shoulder A/ROM to 100 degrees or better flexion and abduction and 20 degrees external rotation in order to use left arm with reaching and dressing tasks.    Time 8   Period Weeks   Status On-going     OT LONG TERM GOAL #3   Title Patient will improve left shoulder strength to 4-/5 in given range for increased ability to competing functional lifting activities.    Time 8   Period Weeks   Status On-going               Plan - 12/14/16 1618    Clinical Impression Statement A: Continued strengthening within available range this session, added 1# weight in sidelying, continued with theraband. Did not add reaching from floor to waist height due to increased pain after strengthening. Pt provided with updated HEP for scapular theraband. ES applied at end of session for pain management.    Plan P: Attempt floor to waist reaching/strengthening task    OT Home Exercise Plan 11/13: table slides 09/14/16:  elevation and row.  09/21/16:  discontinue dowel rod exercises. 2/14: theraband-row, extension, er/IR   Consulted and Agree with Plan of Care Patient      Patient will benefit from skilled therapeutic intervention in order to improve the following deficits and impairments:  Decreased activity tolerance, Decreased strength, Impaired flexibility, Decreased range of motion, Pain, Increased fascial restricitons, Impaired UE functional use  Visit Diagnosis: Stiffness of left shoulder, not elsewhere classified  Acute pain of left shoulder  Other symptoms and signs involving the musculoskeletal  system    Problem List Patient Active Problem List   Diagnosis Date Noted  . Normal coronary arteries 2008 09/02/2016  . H/O renal cell cancer 09/02/2016  . Hypokalemia   . Preop cardiovascular exam 02/24/2014  . Apical variant hypertrophic cardiomyopathy ([place]) 02/24/2014  . Tobacco abuse 02/24/2014  . Essential hypertension 12/12/2008  . CHEST PAIN-UNSPECIFIED 12/12/2008  . ABNORMAL EKG 12/12/2008   Guadelupe Sabin, OTR/L  [phone] 12/14/2016, 4:20 PM  [facility] [address] [place], [place], [address] Phone: [phone]   Fax:  [phone]  Name: [name] Foster MRN: [id number] Date of Birth: [birth year]

## 2016-12-16 ENCOUNTER — Ambulatory Visit (HOSPITAL_COMMUNITY): Payer: Worker's Compensation | Admitting: Occupational Therapy

## 2016-12-16 ENCOUNTER — Encounter (HOSPITAL_COMMUNITY): Payer: Self-pay | Admitting: Occupational Therapy

## 2016-12-16 DIAGNOSIS — M25612 Stiffness of left shoulder, not elsewhere classified: Secondary | ICD-10-CM

## 2016-12-16 DIAGNOSIS — R29898 Other symptoms and signs involving the musculoskeletal system: Secondary | ICD-10-CM

## 2016-12-16 DIAGNOSIS — M25512 Pain in left shoulder: Secondary | ICD-10-CM

## 2016-12-16 NOTE — Therapy (Signed)
[place], [place], [address] Phone: [phone]   Fax:  [phone]  Occupational Therapy Treatment  Patient Details  Name: Tranell Wojtkiewicz MRN: [id number] Date of Birth: 03-05-1956 Referring Provider: Dr. Tania Ade  Encounter Date: 12/16/2016      OT End of Session - 12/16/16 1617    Visit Number 26   Number of Visits 30   Date for OT Re-Evaluation 01/06/17   Authorization Type Workers comp   Authorization Time Period Worker's Comp has approved 12 more visits on 12/08/2016   Authorization - Visit Number 2   Authorization - Number of Visits 26   OT Start Time 1521   OT Stop Time 1604   OT Time Calculation (min) 43 min   Activity Tolerance Patient tolerated treatment well   Behavior During Therapy [facility] for tasks assessed/performed      Past Medical History:  Diagnosis Date  . Abnormal EKG   . Apical variant hypertrophic cardiomyopathy ([place])   . Benign essential hypertension   . BPH (benign prostatic hyperplasia)   . Hypokalemia   . Tobacco abuse     Past Surgical History:  Procedure Laterality Date  . CARDIAC CATHETERIZATION     2008  . COLONOSCOPY N/A 02/26/2015   Procedure: COLONOSCOPY;  Surgeon: Rogene Houston, MD;  [facility];  Service: Endoscopy;  Laterality: N/A;  830  . KIDNEY SURGERY Right April 2015   benign tumor removal  . PROSTATE ABLATION  12-2015 at [facility]  . SHOULDER ACROMIOPLASTY Left 01/18/2016   Procedure: SHOULDER ACROMIOPLASTY;  Surgeon: Milly Jakob, MD;  Location: [place];  Service: Orthopedics;  Laterality: Left;  . SHOULDER ARTHROSCOPY WITH ROTATOR CUFF REPAIR AND SUBACROMIAL DECOMPRESSION Left 01/18/2016   Procedure: LEFT SHOULDER ARTHROSCOPY WITH ROTATOR CUFF REPAIR AND SUBACROMIAL DECOMPRESSION;  Surgeon: Milly Jakob, MD;  Location: [place];  Service: Orthopedics;  Laterality: Left;  PRE-OP BLOCK WITH GENERAL ANESTHESIA     There were no vitals filed for this visit.      Subjective Assessment - 12/16/16 1524    Subjective  S: That muscle in the top of my shoulder really hurts up into my neck at night. (trapezius)   Currently in Pain? Yes   Pain Score 5    Pain Location Shoulder   Pain Orientation Left   Pain Descriptors / Indicators Aching   Pain Type Acute pain   Pain Radiating Towards n/a   Pain Onset More than a month ago   Pain Frequency Intermittent   Aggravating Factors  movement, cold   Pain Relieving Factors rest, positioning, heat   Effect of Pain on Daily Activities max difficulty during ADL completion   Multiple Pain Sites No            [facility] OT Assessment - 12/16/16 1515      Assessment   Diagnosis Left capsular release s/p Left RCR     Precautions   Precautions None                  OT Treatments/Exercises (OP) - 12/16/16 1526      Exercises   Exercises Shoulder     Shoulder Exercises: Supine   Protraction Strengthening;10 reps   Protraction Weight (lbs) 1   Horizontal ABduction Strengthening;10 reps   Horizontal ABduction Weight (lbs) 1   External Rotation Strengthening;10 reps   External Rotation Weight (lbs) 1   Internal Rotation Strengthening;10 reps  Internal Rotation Weight (lbs) 1   Flexion Strengthening;10 reps   Shoulder Flexion Weight (lbs) 1   ABduction Strengthening;10 reps   Shoulder ABduction Weight (lbs) 1     Shoulder Exercises: Prone   Retraction Strengthening;10 reps   Retraction Weight (lbs) 1   Flexion Strengthening;10 reps   Flexion Weight (lbs) 1   Extension Strengthening;10 reps   Extension Weight (lbs) 1   Horizontal ABduction 1 Strengthening;10 reps   Horizontal ABduction 1 Weight (lbs) 1     Shoulder Exercises: Standing   Protraction Theraband;15 reps   Theraband Level (Shoulder Protraction) Level 2 (Red)   External Rotation Theraband;15 reps   Theraband Level (Shoulder External Rotation) Level 2 (Red)   Internal  Rotation Theraband;15 reps   Theraband Level (Shoulder Internal Rotation) Level 2 (Red)   Extension Theraband;15 reps   Theraband Level (Shoulder Extension) Level 2 (Red)   Row Theraband;15 reps   Theraband Level (Shoulder Row) Level 2 (Red)     Shoulder Exercises: ROM/Strengthening   UBE (Upper Arm Bike) levle 2:  2 minutes forward and 2 minutes reverse, concentrating on depressing shoulder blade   Ball on Wall 30" flexion     Manual Therapy   Manual Therapy Myofascial release   Manual therapy comments performed separately from all other skilled intervetnions    Myofascial Release myofascial release and manual stretching to left upper arm, scapular, and shoulder regions to decrease pain and restrictions and improve pain free mobility.  manual cervical traction and myofascial release to cervical region                   OT Short Term Goals - 11/22/16 1441      OT SHORT TERM GOAL #1   Title Pt will be educated on HEP.    Time 4   Period Weeks     OT SHORT TERM GOAL #2   Title Pt will decrease pain in LUE to 4/10 or less to improve ability to complete daily tasks.   Time 4   Period Weeks   Status On-going     OT SHORT TERM GOAL #3   Title Pt will decrease LUE fascial restrictions to min amounts to improve mobility in LUE during functional tasks.    Time 4   Period Weeks     OT SHORT TERM GOAL #4   Title Pt will improve A/ROM of LUE by 50% to improve ability to use LUE during ADL completion.    Time 4   Period Weeks   Status Partially Met     OT SHORT TERM GOAL #5   Title Pt will improve LUE strength to 3/5 to increase ability to use LUE as assist during ADL and work tasks.   Time 4   Period Weeks           OT Long Term Goals - 11/22/16 1441      OT LONG TERM GOAL #1   Title Patient will use his left arm to reach to items at or slightly above shoulder height during ADL completion.     Time 8   Period Weeks   Status On-going     OT LONG TERM GOAL #2    Title Patient will improve left shoulder A/ROM to 100 degrees or better flexion and abduction and 20 degrees external rotation in order to use left arm with reaching and dressing tasks.    Time 8   Period Weeks   Status On-going  OT LONG TERM GOAL #3   Title Patient will improve left shoulder strength to 4-/5 in given range for increased ability to competing functional lifting activities.    Time 8   Period Weeks   Status On-going               Plan - 12/16/16 1617    Clinical Impression Statement A: Continued focusing on strengthening LUE within range this session, added 1# weight to prone exercises, also added red theraband protraction. Pt reports scapular theraband exercises are going well at home. Pt reporting increased pain at night extending from trapezius up the neck, has to get up, stretch, and reposition to decrease the pain.    Plan P: Attempt floor to waist reaching/strengthening task, add cervical neck stretches and provide HEP when appropriate.    OT Home Exercise Plan 11/13: table slides 09/14/16:  elevation and row.  09/21/16:  discontinue dowel rod exercises. 2/14: theraband-row, extension, er/IR   Consulted and Agree with Plan of Care Patient      Patient will benefit from skilled therapeutic intervention in order to improve the following deficits and impairments:  Decreased activity tolerance, Decreased strength, Impaired flexibility, Decreased range of motion, Pain, Increased fascial restricitons, Impaired UE functional use  Visit Diagnosis: Stiffness of left shoulder, not elsewhere classified  Acute pain of left shoulder  Other symptoms and signs involving the musculoskeletal system    Problem List Patient Active Problem List   Diagnosis Date Noted  . Normal coronary arteries 2008 09/02/2016  . H/O renal cell cancer 09/02/2016  . Hypokalemia   . Preop cardiovascular exam 02/24/2014  . Apical variant hypertrophic cardiomyopathy ([place]) 02/24/2014   . Tobacco abuse 02/24/2014  . Essential hypertension 12/12/2008  . CHEST PAIN-UNSPECIFIED 12/12/2008  . ABNORMAL EKG 12/12/2008   Guadelupe Sabin, OTR/L  [phone] 12/16/2016, 4:20 PM  [facility] [address] [place], [place], [address] Phone: [phone]   Fax:  [phone]  Name: Elmin Wiederholt MRN: [id number] Date of Birth: [birth year]

## 2016-12-26 ENCOUNTER — Ambulatory Visit (HOSPITAL_COMMUNITY): Payer: Worker's Compensation

## 2016-12-26 DIAGNOSIS — M25612 Stiffness of left shoulder, not elsewhere classified: Secondary | ICD-10-CM | POA: Diagnosis not present

## 2016-12-26 DIAGNOSIS — R29898 Other symptoms and signs involving the musculoskeletal system: Secondary | ICD-10-CM

## 2016-12-26 DIAGNOSIS — M25512 Pain in left shoulder: Secondary | ICD-10-CM

## 2016-12-26 NOTE — Therapy (Signed)
[place], [place], [address] Phone: [phone]   Fax:  [phone]  Occupational Therapy Treatment  Patient Details  Name: [name] Foster MRN: [id number] Date of Birth: 1956-09-29 Referring Provider: Dr. Tania Ade  Encounter Date: 12/26/2016      OT End of Session - 12/26/16 1624    Visit Number 27   Number of Visits 30   Date for OT Re-Evaluation 01/06/17   Authorization Type Workers comp   Authorization Time Period Worker's Comp has approved 12 more visits on 12/08/2016   Authorization - Visit Number 70   Authorization - Number of Visits 26   OT Start Time 1435   OT Stop Time 1515   OT Time Calculation (min) 40 min   Activity Tolerance Patient tolerated treatment well   Behavior During Therapy [facility] for tasks assessed/performed      Past Medical History:  Diagnosis Date  . Abnormal EKG   . Apical variant hypertrophic cardiomyopathy ([place])   . Benign essential hypertension   . BPH (benign prostatic hyperplasia)   . Hypokalemia   . Tobacco abuse     Past Surgical History:  Procedure Laterality Date  . CARDIAC CATHETERIZATION     2008  . COLONOSCOPY N/A 02/26/2015   Procedure: COLONOSCOPY;  Surgeon: Rogene Houston, MD;  [facility];  Service: Endoscopy;  Laterality: N/A;  830  . KIDNEY SURGERY Right April 2015   benign tumor removal  . PROSTATE ABLATION  12-2015 at [facility]  . SHOULDER ACROMIOPLASTY Left 01/18/2016   Procedure: SHOULDER ACROMIOPLASTY;  Surgeon: Milly Jakob, MD;  Location: [place];  Service: Orthopedics;  Laterality: Left;  . SHOULDER ARTHROSCOPY WITH ROTATOR CUFF REPAIR AND SUBACROMIAL DECOMPRESSION Left 01/18/2016   Procedure: LEFT SHOULDER ARTHROSCOPY WITH ROTATOR CUFF REPAIR AND SUBACROMIAL DECOMPRESSION;  Surgeon: Milly Jakob, MD;  Location: [place];  Service: Orthopedics;  Laterality: Left;  PRE-OP BLOCK WITH GENERAL ANESTHESIA     There were no vitals filed for this visit.      Subjective Assessment - 12/26/16 1453    Subjective  S: I really worked on my shoulder when I was off with my red theraband.    Currently in Pain? Yes   Pain Score 5    Pain Location Shoulder   Pain Orientation Left   Pain Descriptors / Indicators Aching   Pain Type Acute pain   Pain Radiating Towards N/A   Pain Onset More than a month ago   Pain Frequency Intermittent   Aggravating Factors  movement and cold                      OT Treatments/Exercises (OP) - 12/26/16 1457      Exercises   Exercises Shoulder     Shoulder Exercises: Supine   Protraction Strengthening;10 reps   Protraction Weight (lbs) 1   Horizontal ABduction Strengthening;10 reps   Horizontal ABduction Weight (lbs) 1   External Rotation Strengthening;10 reps   External Rotation Weight (lbs) 1   Internal Rotation Strengthening;10 reps   Internal Rotation Weight (lbs) 1   Flexion Strengthening;10 reps   Shoulder Flexion Weight (lbs) 1   ABduction Strengthening;10 reps   Shoulder ABduction Weight (lbs) 1     Shoulder Exercises: ROM/Strengthening   UBE (Upper Arm Bike) Level 3: 2' reverse 2' forward   Cybex Press 1 plate;15 reps   Cybex Row 1 plate;15 reps   Over  Head Lace 2'   Proximal Shoulder Strengthening, Supine 10X with 1# no rest breaks   Other ROM/Strengthening Exercises pt completed functional floor to waist level starting with 2.5 lbs and progressing adding weight until he reached 12.5lbs and completed task a total of 5 times.      Manual Therapy   Manual Therapy Myofascial release   Manual therapy comments performed separately from all other skilled intervetnions    Myofascial Release myofascial release and manual stretching to left upper arm, scapular, and shoulder regions to decrease pain and restrictions and improve pain free mobility.  manual cervical traction and myofascial release to cervical region                    OT Short Term Goals - 11/22/16 1441      OT SHORT TERM GOAL #1   Title Pt will be educated on HEP.    Time 4   Period Weeks     OT SHORT TERM GOAL #2   Title Pt will decrease pain in LUE to 4/10 or less to improve ability to complete daily tasks.   Time 4   Period Weeks   Status On-going     OT SHORT TERM GOAL #3   Title Pt will decrease LUE fascial restrictions to min amounts to improve mobility in LUE during functional tasks.    Time 4   Period Weeks     OT SHORT TERM GOAL #4   Title Pt will improve A/ROM of LUE by 50% to improve ability to use LUE during ADL completion.    Time 4   Period Weeks   Status Partially Met     OT SHORT TERM GOAL #5   Title Pt will improve LUE strength to 3/5 to increase ability to use LUE as assist during ADL and work tasks.   Time 4   Period Weeks           OT Long Term Goals - 11/22/16 1441      OT LONG TERM GOAL #1   Title Patient will use his left arm to reach to items at or slightly above shoulder height during ADL completion.     Time 8   Period Weeks   Status On-going     OT LONG TERM GOAL #2   Title Patient will improve left shoulder A/ROM to 100 degrees or better flexion and abduction and 20 degrees external rotation in order to use left arm with reaching and dressing tasks.    Time 8   Period Weeks   Status On-going     OT LONG TERM GOAL #3   Title Patient will improve left shoulder strength to 4-/5 in given range for increased ability to competing functional lifting activities.    Time 8   Period Weeks   Status On-going               Plan - 12/26/16 1625    Clinical Impression Statement A: Pt completed floor to waist level lifting with weighted box this session. Patient was able to complete with 12.5lbs. Overall, sessions continue to focus on strengthening of what movement patient has. VC as needed for form and technique.   Plan P: Continue to focus on strengthening within the ROM  limitations.      Patient will benefit from skilled therapeutic intervention in order to improve the following deficits and impairments:  Decreased activity tolerance, Decreased strength, Impaired flexibility, Decreased range of motion, Pain, Increased  fascial restricitons, Impaired UE functional use  Visit Diagnosis: Stiffness of left shoulder, not elsewhere classified  Acute pain of left shoulder  Other symptoms and signs involving the musculoskeletal system    Problem List Patient Active Problem List   Diagnosis Date Noted  . Normal coronary arteries 2008 09/02/2016  . H/O renal cell cancer 09/02/2016  . Hypokalemia   . Preop cardiovascular exam 02/24/2014  . Apical variant hypertrophic cardiomyopathy ([place]) 02/24/2014  . Tobacco abuse 02/24/2014  . Essential hypertension 12/12/2008  . CHEST PAIN-UNSPECIFIED 12/12/2008  . ABNORMAL EKG 12/12/2008   Ailene Ravel, OTR/L,CBIS  [phone]  12/26/2016, 4:28 PM  [facility] [address] [place], [place], [address] Phone: [phone]   Fax:  [phone]  Name: [name] Foster MRN: [id number] Date of Birth: June 14, 1956

## 2016-12-28 ENCOUNTER — Encounter (HOSPITAL_COMMUNITY): Payer: Self-pay

## 2016-12-28 ENCOUNTER — Ambulatory Visit (HOSPITAL_COMMUNITY): Payer: Worker's Compensation

## 2016-12-28 DIAGNOSIS — M25612 Stiffness of left shoulder, not elsewhere classified: Secondary | ICD-10-CM

## 2016-12-28 DIAGNOSIS — M25512 Pain in left shoulder: Secondary | ICD-10-CM

## 2016-12-28 DIAGNOSIS — R29898 Other symptoms and signs involving the musculoskeletal system: Secondary | ICD-10-CM

## 2016-12-29 NOTE — Therapy (Signed)
[place], [place], [address] Phone: [phone]   Fax:  [phone]  Occupational Therapy Treatment  Patient Details  Name: [name] Foster MRN: [id number] Date of Birth: December 06, 1955 Referring Provider: Dr. Tania Ade  Encounter Date: 12/28/2016      OT End of Session - 12/29/16 1145    Visit Number 28   Number of Visits 30   Date for OT Re-Evaluation 01/06/17   Authorization Type Workers comp   Authorization Time Period Worker's Comp has approved 12 more visits on 12/08/2016   Authorization - Visit Number 18   Authorization - Number of Visits 26   OT Start Time 1355   OT Stop Time 1431   OT Time Calculation (min) 36 min   Activity Tolerance Patient tolerated treatment well   Behavior During Therapy [facility] for tasks assessed/performed      Past Medical History:  Diagnosis Date  . Abnormal EKG   . Apical variant hypertrophic cardiomyopathy ([place])   . Benign essential hypertension   . BPH (benign prostatic hyperplasia)   . Hypokalemia   . Tobacco abuse     Past Surgical History:  Procedure Laterality Date  . CARDIAC CATHETERIZATION     2008  . COLONOSCOPY N/A 02/26/2015   Procedure: COLONOSCOPY;  Surgeon: Rogene Houston, MD;  [facility];  Service: Endoscopy;  Laterality: N/A;  830  . KIDNEY SURGERY Right April 2015   benign tumor removal  . PROST[facility] ABLATION  12-[facility]  . SHOULDER ACROMIOPLASTY Left 01/18/2016   Procedure: SHOULDER ACROMIOPLASTY;  Surgeon: Milly Jakob, MD;  Location: [place];  Service: Orthopedics;  Laterality: Left;  . SHOULDER ARTHROSCOPY WITH ROTATOR CUFF REPAIR AND SUBACROMIAL DECOMPRESSION Left 01/18/2016   Procedure: LEFT SHOULDER ARTHROSCOPY WITH ROTATOR CUFF REPAIR AND SUBACROMIAL DECOMPRESSION;  Surgeon: Milly Jakob, MD;  Location: [place];  Service: Orthopedics;  Laterality: Left;  PRE-OP BLOCK WITH GENERAL ANESTHESIA     There were no vitals filed for this visit.      Subjective Assessment - 12/28/16 1408    Subjective  S: It's still at a 5.   Currently in Pain? Yes   Pain Score 5    Pain Location Shoulder   Pain Descriptors / Indicators Aching   Pain Type Acute pain            [facility] OT Assessment - 12/28/16 1409      Assessment   Diagnosis Left capsular release s/p Left RCR     Precautions   Precautions None                  OT Treatments/Exercises (OP) - 12/28/16 1409      Exercises   Exercises Shoulder     Shoulder Exercises: Supine   Protraction Strengthening;12 reps   Protraction Weight (lbs) 1   Horizontal ABduction Strengthening;12 reps   Horizontal ABduction Weight (lbs) 1   External Rotation Strengthening;12 reps   External Rotation Weight (lbs) 1   Internal Rotation Strengthening;12 reps   Internal Rotation Weight (lbs) 1   Flexion Strengthening;12 reps   Shoulder Flexion Weight (lbs) 1   ABduction Strengthening;12 reps   Shoulder ABduction Weight (lbs) 1     Shoulder Exercises: Prone   Retraction Strengthening;10 reps   Retraction Weight (lbs) 1   Flexion Strengthening;10 reps   Flexion Weight (lbs) 1   Extension Strengthening;10 reps   Extension Weight (lbs) 1  External Rotation Strengthening;10 reps   External Rotation Weight (lbs) 1   Internal Rotation Strengthening;10 reps   Internal Rotation Weight (lbs) 1   Horizontal ABduction 1 Strengthening;10 reps   Horizontal ABduction 1 Weight (lbs) 1   Horizontal ABduction 2 Strengthening;10 reps   Horizontal ABduction 2 Weight (lbs) 1   Other Prone Exercises Scaption; 10X' 1#     Shoulder Exercises: Sidelying   External Rotation Strengthening;10 reps   External Rotation Weight (lbs) 1   Internal Rotation Strengthening;10 reps   Internal Rotation Weight (lbs) 1   Flexion Strengthening;10 reps   Flexion Weight (lbs) 1   ABduction Strengthening;10 reps   ABduction Weight (lbs) 1   Other  Sidelying Exercises protraction, 10X, 1# weight     Shoulder Exercises: ROM/Strengthening   Over Head Lace 2'   Proximal Shoulder Strengthening, Supine 12X with 1# no rest breaks     Manual Therapy   Manual Therapy Myofascial release   Manual therapy comments performed separately from all other skilled intervetnions    Myofascial Release myofascial release and manual stretching to left upper arm, scapular, and shoulder regions to decrease pain and restrictions and improve pain free mobility.  manual cervical traction and myofascial release to cervical region                   OT Short Term Goals - 11/22/16 1441      OT SHORT TERM GOAL #1   Title Pt will be educated on HEP.    Time 4   Period Weeks     OT SHORT TERM GOAL #2   Title Pt will decrease pain in LUE to 4/10 or less to improve ability to complete daily tasks.   Time 4   Period Weeks   Status On-going     OT SHORT TERM GOAL #3   Title Pt will decrease LUE fascial restrictions to min amounts to improve mobility in LUE during functional tasks.    Time 4   Period Weeks     OT SHORT TERM GOAL #4   Title Pt will improve A/ROM of LUE by 50% to improve ability to use LUE during ADL completion.    Time 4   Period Weeks   Status Partially Met     OT SHORT TERM GOAL #5   Title Pt will improve LUE strength to 3/5 to increase ability to use LUE as assist during ADL and work tasks.   Time 4   Period Weeks           OT Long Term Goals - 11/22/16 1441      OT LONG TERM GOAL #1   Title Patient will use his left arm to reach to items at or slightly above shoulder height during ADL completion.     Time 8   Period Weeks   Status On-going     OT LONG TERM GOAL #2   Title Patient will improve left shoulder A/ROM to 100 degrees or better flexion and abduction and 20 degrees external rotation in order to use left arm with reaching and dressing tasks.    Time 8   Period Weeks   Status On-going     OT LONG TERM  GOAL #3   Title Patient will improve left shoulder strength to 4-/5 in given range for increased ability to competing functional lifting activities.    Time 8   Period Weeks   Status On-going  Plan - 12/29/16 1145    Clinical Impression Statement A: Pt become sad and tearful with therapist discussed patient's overall progress and the limited amount of progress his LUE as shown lately. Mentioned that patient's LUE will never be at 100% and it is more likely that he will remain at the level he is at as he has been trending more at a plateau. Therapy has been focusing on strengthening what range and movement he currently has as we have not been able to gain any additional Active or Passive ROM for quite some time.    Plan P: Continue to focus on strengthening within the ROM limitiations. Discuss what functional tasks he is having the most difficulty and look at adaptive equipment or compensatory techniques that may help him be more independent with task.      Patient will benefit from skilled therapeutic intervention in order to improve the following deficits and impairments:  Decreased activity tolerance, Decreased strength, Impaired flexibility, Decreased range of motion, Pain, Increased fascial restricitons, Impaired UE functional use  Visit Diagnosis: Stiffness of left shoulder, not elsewhere classified  Acute pain of left shoulder  Other symptoms and signs involving the musculoskeletal system    Problem List Patient Active Problem List   Diagnosis Date Noted  . Normal coronary arteries 2008 09/02/2016  . H/O renal cell cancer 09/02/2016  . Hypokalemia   . Preop cardiovascular exam 02/24/2014  . Apical variant hypertrophic cardiomyopathy ([place]) 02/24/2014  . Tobacco abuse 02/24/2014  . Essential hypertension 12/12/2008  . CHEST PAIN-UNSPECIFIED 12/12/2008  . ABNORMAL EKG 12/12/2008   Ailene Ravel, OTR/L,CBIS  [phone]  12/29/2016, 11:50 AM  [place], [place], [address] Phone: [phone]   Fax:  [phone]  Name: Gardy Montanari MRN: [id number] Date of Birth: 1956-10-18

## 2016-12-30 ENCOUNTER — Encounter (HOSPITAL_COMMUNITY): Payer: Self-pay

## 2016-12-30 ENCOUNTER — Ambulatory Visit (HOSPITAL_COMMUNITY): Payer: Worker's Compensation | Attending: Orthopedic Surgery

## 2016-12-30 DIAGNOSIS — R29898 Other symptoms and signs involving the musculoskeletal system: Secondary | ICD-10-CM

## 2016-12-30 DIAGNOSIS — M25512 Pain in left shoulder: Secondary | ICD-10-CM | POA: Diagnosis present

## 2016-12-30 DIAGNOSIS — M25612 Stiffness of left shoulder, not elsewhere classified: Secondary | ICD-10-CM

## 2016-12-30 NOTE — Therapy (Signed)
[place], [place], [address] Phone: [phone]   Fax:  [phone]  Occupational Therapy Treatment  Patient Details  Name: [name] Foster MRN: [id number] Date of Birth: 12-03-1955 Referring Provider: Dr. Tania Ade  Encounter Date: 12/30/2016      OT End of Session - 12/30/16 1426    Visit Number 29   Number of Visits 31   Date for OT Re-Evaluation 01/06/17   Authorization Type Workers comp   Authorization Time Period Worker's Comp has approved 12 more visits on 12/08/2016   Authorization - Visit Number 22   Authorization - Number of Visits 26   OT Start Time 1350   OT Stop Time 1430   OT Time Calculation (min) 40 min   Activity Tolerance Patient tolerated treatment well   Behavior During Therapy [facility] for tasks assessed/performed      Past Medical History:  Diagnosis Date  . Abnormal EKG   . Apical variant hypertrophic cardiomyopathy ([place])   . Benign essential hypertension   . BPH (benign prostatic hyperplasia)   . Hypokalemia   . Tobacco abuse     Past Surgical History:  Procedure Laterality Date  . CARDIAC CATHETERIZATION     2008  . COLONOSCOPY N/A 02/26/2015   Procedure: COLONOSCOPY;  Surgeon: Rogene Houston, MD;  [facility];  Service: Endoscopy;  Laterality: N/A;  830  . KIDNEY SURGERY Right April 2015   benign tumor removal  . PROSTATE ABLATION  12-2015 at [facility]  . SHOULDER ACROMIOPLASTY Left 01/18/2016   Procedure: SHOULDER ACROMIOPLASTY;  Surgeon: Milly Jakob, MD;  Location: [place];  Service: Orthopedics;  Laterality: Left;  . SHOULDER ARTHROSCOPY WITH ROTATOR CUFF REPAIR AND SUBACROMIAL DECOMPRESSION Left 01/18/2016   Procedure: LEFT SHOULDER ARTHROSCOPY WITH ROTATOR CUFF REPAIR AND SUBACROMIAL DECOMPRESSION;  Surgeon: Milly Jakob, MD;  Location: [place];  Service: Orthopedics;  Laterality: Left;  PRE-OP BLOCK WITH GENERAL ANESTHESIA     There were no vitals filed for this visit.      Subjective Assessment - 12/30/16 1407    Subjective  S I can only sleep on my stomach with my arm hanging off the bed.   Currently in Pain? Yes   Pain Score 5    Pain Location Shoulder   Pain Orientation Left   Pain Descriptors / Indicators Aching   Pain Type Acute pain            [facility] OT Assessment - 12/30/16 1408      Assessment   Diagnosis Left capsular release s/p Left RCR     Precautions   Precautions None                  OT Treatments/Exercises (OP) - 12/30/16 1408      Exercises   Exercises Shoulder     Shoulder Exercises: Prone   Retraction Strengthening;12 reps   Retraction Weight (lbs) 1   Flexion Strengthening;12 reps   Flexion Weight (lbs) 1   Extension Strengthening;12 reps   Extension Weight (lbs) 1   External Rotation Strengthening;12 reps   External Rotation Weight (lbs) 1   Internal Rotation Strengthening;12 reps   Internal Rotation Weight (lbs) 1   Horizontal ABduction 1 Strengthening;12 reps   Horizontal ABduction 1 Weight (lbs) 1   Horizontal ABduction 2 Strengthening;12 reps   Horizontal ABduction 2 Weight (lbs) 1   Other Prone Exercises Scaption; 12X' 1#     Shoulder  Exercises: ROM/Strengthening   UBE (Upper Arm Bike) Level 3: 2' reverse 2' forward   Cybex Press 1 plate;15 reps   Cybex Row 1 plate;15 reps   Wall Pushups --  12 reps   Ball on Wall 1' flexion 40" abduction with light ball.      Functional Reaching Activities   Mid Level Patient completed functional reaching task using cones and various wrist weights focusing on shoulder stability and strength needed when placing items into the fridge.                  OT Short Term Goals - 11/22/16 1441      OT SHORT TERM GOAL #1   Title Pt will be educated on HEP.    Time 4   Period Weeks     OT SHORT TERM GOAL #2   Title Pt will decrease pain in LUE to 4/10 or less to improve ability to complete  daily tasks.   Time 4   Period Weeks   Status On-going     OT SHORT TERM GOAL #3   Title Pt will decrease LUE fascial restrictions to min amounts to improve mobility in LUE during functional tasks.    Time 4   Period Weeks     OT SHORT TERM GOAL #4   Title Pt will improve A/ROM of LUE by 50% to improve ability to use LUE during ADL completion.    Time 4   Period Weeks   Status Partially Met     OT SHORT TERM GOAL #5   Title Pt will improve LUE strength to 3/5 to increase ability to use LUE as assist during ADL and work tasks.   Time 4   Period Weeks           OT Long Term Goals - 11/22/16 1441      OT LONG TERM GOAL #1   Title Patient will use his left arm to reach to items at or slightly above shoulder height during ADL completion.     Time 8   Period Weeks   Status On-going     OT LONG TERM GOAL #2   Title Patient will improve left shoulder A/ROM to 100 degrees or better flexion and abduction and 20 degrees external rotation in order to use left arm with reaching and dressing tasks.    Time 8   Period Weeks   Status On-going     OT LONG TERM GOAL #3   Title Patient will improve left shoulder strength to 4-/5 in given range for increased ability to competing functional lifting activities.    Time 8   Period Weeks   Status On-going               Plan - 12/30/16 1426    Clinical Impression Statement A: Focused session more towards what activities patient was noticing the most difficulty with at home. Mentioned reaching for the faucet to turn it on and placing items in the fridge. Patient was able to reach faucet at sink functionally this date. patient mentioned it was difficult to place a 2L bottle in the fridge. Discussed that the weight of a full 2L bottle is too heavy to be lifting. He is only lifting 1# hand weights in the clinic which are challenging. Patient does not have the needed range and strength to lift a 2L bottle with liquid.   Plan P: Continue to  focus on functional movements and activities that patient continues  to struggle in order to prepare for discharge in the future.       Patient will benefit from skilled therapeutic intervention in order to improve the following deficits and impairments:  Decreased activity tolerance, Decreased strength, Impaired flexibility, Decreased range of motion, Pain, Increased fascial restricitons, Impaired UE functional use  Visit Diagnosis: Stiffness of left shoulder, not elsewhere classified  Acute pain of left shoulder  Other symptoms and signs involving the musculoskeletal system    Problem List Patient Active Problem List   Diagnosis Date Noted  . Normal coronary arteries 2008 09/02/2016  . H/O renal cell cancer 09/02/2016  . Hypokalemia   . Preop cardiovascular exam 02/24/2014  . Apical variant hypertrophic cardiomyopathy ([place]) 02/24/2014  . Tobacco abuse 02/24/2014  . Essential hypertension 12/12/2008  . CHEST PAIN-UNSPECIFIED 12/12/2008  . ABNORMAL EKG 12/12/2008   Ailene Ravel, OTR/L,CBIS  [phone]  12/30/2016, 8:50 PM  [place] [address] [place], [place], [address] Phone: [phone]   Fax:  [phone]  Name: Enzo Treu MRN: [id number] Date of Birth: April 07, 1956

## 2017-01-02 ENCOUNTER — Encounter (HOSPITAL_COMMUNITY): Payer: Self-pay

## 2017-01-02 ENCOUNTER — Ambulatory Visit (HOSPITAL_COMMUNITY): Payer: Worker's Compensation

## 2017-01-02 DIAGNOSIS — M25612 Stiffness of left shoulder, not elsewhere classified: Secondary | ICD-10-CM | POA: Diagnosis not present

## 2017-01-02 DIAGNOSIS — R29898 Other symptoms and signs involving the musculoskeletal system: Secondary | ICD-10-CM

## 2017-01-02 DIAGNOSIS — M25512 Pain in left shoulder: Secondary | ICD-10-CM

## 2017-01-02 NOTE — Therapy (Signed)
[place], [place], [address] Phone: [phone]   Fax:  [phone]  Occupational Therapy Treatment  Patient Details  Name: [name] Foster MRN: [id number] Date of Birth: 03/01/1956 Referring Provider: Dr. Tania Ade  Encounter Date: 01/02/2017      OT End of Session - 01/02/17 1632    Visit Number 30   Number of Visits 31   Date for OT Re-Evaluation 01/06/17   Authorization Type Workers comp   Authorization Time Period Worker's Comp has approved 12 more visits on 12/08/2016   Authorization - Visit Number 38   Authorization - Number of Visits 26   OT Start Time 1430   OT Stop Time 1515   OT Time Calculation (min) 45 min   Activity Tolerance Patient tolerated treatment well   Behavior During Therapy [facility] for tasks assessed/performed      Past Medical History:  Diagnosis Date  . Abnormal EKG   . Apical variant hypertrophic cardiomyopathy ([place])   . Benign essential hypertension   . BPH (benign prostatic hyperplasia)   . Hypokalemia   . Tobacco abuse     Past Surgical History:  Procedure Laterality Date  . CARDIAC CATHETERIZATION     2008  . COLONOSCOPY N/A 02/26/2015   Procedure: COLONOSCOPY;  Surgeon: Rogene Houston, MD;  [facility];  Service: Endoscopy;  Laterality: N/A;  830  . KIDNEY SURGERY Right April 2015   benign tumor removal  . PROSTATE ABLATION  12-2015 at [facility]  . SHOULDER ACROMIOPLASTY Left 01/18/2016   Procedure: SHOULDER ACROMIOPLASTY;  Surgeon: Milly Jakob, MD;  Location: [place];  Service: Orthopedics;  Laterality: Left;  . SHOULDER ARTHROSCOPY WITH ROTATOR CUFF REPAIR AND SUBACROMIAL DECOMPRESSION Left 01/18/2016   Procedure: LEFT SHOULDER ARTHROSCOPY WITH ROTATOR CUFF REPAIR AND SUBACROMIAL DECOMPRESSION;  Surgeon: Milly Jakob, MD;  Location: [place];  Service: Orthopedics;  Laterality: Left;  PRE-OP BLOCK WITH GENERAL ANESTHESIA     There were no vitals filed for this visit.      Subjective Assessment - 01/02/17 1449    Subjective  S: I had another bad night.   Currently in Pain? Yes   Pain Score 5    Pain Location Shoulder   Pain Orientation Left   Pain Descriptors / Indicators Aching   Pain Type Acute pain   Pain Radiating Towards N/A   Pain Onset More than a month ago   Pain Frequency Intermittent   Aggravating Factors  movement and cold   Pain Relieving Factors rest, positioning, heat   Effect of Pain on Daily Activities max difficulty during ADL completion   Multiple Pain Sites No            [facility] OT Assessment - 01/02/17 1451      Assessment   Diagnosis Left capsular release s/p Left RCR     Precautions   Precautions None                  OT Treatments/Exercises (OP) - 01/02/17 1451      Exercises   Exercises Shoulder     Shoulder Exercises: Prone   Retraction Strengthening;12 reps   Retraction Weight (lbs) 1   Flexion Strengthening;12 reps   Flexion Weight (lbs) 1   Extension Strengthening;12 reps   Extension Weight (lbs) 1   External Rotation Strengthening;12 reps   External Rotation Weight (lbs) 1   Internal Rotation Strengthening;12 reps   Internal Rotation  Weight (lbs) 1   Horizontal ABduction 1 Strengthening;12 reps   Horizontal ABduction 1 Weight (lbs) 1   Horizontal ABduction 2 Strengthening;12 reps   Horizontal ABduction 2 Weight (lbs) 1   Other Prone Exercises Scaption; 12X' 1#     Shoulder Exercises: ROM/Strengthening   UBE (Upper Arm Bike) Level 3: 2' reverse 2' forward   Cybex Press 1 plate;15 reps   Cybex Row 1 plate;15 reps   Over Head Lace 2'   Wall Pushups --  12X   Other ROM/Strengthening Exercises Underhand weighted ball toss (green) completed using trampoline. 15X     Functional Reaching Activities   Mid Level Pt complete funcntional reaching task in which he used cones and various weight items including a 1# wrist weight to focus on  shoulder stability during reaching tasks at home.                  OT Short Term Goals - 11/22/16 1441      OT SHORT TERM GOAL #1   Title Pt will be educated on HEP.    Time 4   Period Weeks     OT SHORT TERM GOAL #2   Title Pt will decrease pain in LUE to 4/10 or less to improve ability to complete daily tasks.   Time 4   Period Weeks   Status On-going     OT SHORT TERM GOAL #3   Title Pt will decrease LUE fascial restrictions to min amounts to improve mobility in LUE during functional tasks.    Time 4   Period Weeks     OT SHORT TERM GOAL #4   Title Pt will improve A/ROM of LUE by 50% to improve ability to use LUE during ADL completion.    Time 4   Period Weeks   Status Partially Met     OT SHORT TERM GOAL #5   Title Pt will improve LUE strength to 3/5 to increase ability to use LUE as assist during ADL and work tasks.   Time 4   Period Weeks           OT Long Term Goals - 11/22/16 1441      OT LONG TERM GOAL #1   Title Patient will use his left arm to reach to items at or slightly above shoulder height during ADL completion.     Time 8   Period Weeks   Status On-going     OT LONG TERM GOAL #2   Title Patient will improve left shoulder A/ROM to 100 degrees or better flexion and abduction and 20 degrees external rotation in order to use left arm with reaching and dressing tasks.    Time 8   Period Weeks   Status On-going     OT LONG TERM GOAL #3   Title Patient will improve left shoulder strength to 4-/5 in given range for increased ability to competing functional lifting activities.    Time 8   Period Weeks   Status On-going               Plan - 01/02/17 1632    Clinical Impression Statement A: Continued to focus on mid level functional reaching and shoulder stability during session. Add underhanded weighted ball tosses in hopes that patient will be able to play catch with son. Patient reports continued discomfort and pain in shoulder  with exercises.    Plan P: Continue to focus on mid level reaching in order to prepare  for discharge on Friday.      Patient will benefit from skilled therapeutic intervention in order to improve the following deficits and impairments:  Decreased activity tolerance, Decreased strength, Impaired flexibility, Decreased range of motion, Pain, Increased fascial restricitons, Impaired UE functional use  Visit Diagnosis: Stiffness of left shoulder, not elsewhere classified  Acute pain of left shoulder  Other symptoms and signs involving the musculoskeletal system    Problem List Patient Active Problem List   Diagnosis Date Noted  . Normal coronary arteries 2008 09/02/2016  . H/O renal cell cancer 09/02/2016  . Hypokalemia   . Preop cardiovascular exam 02/24/2014  . Apical variant hypertrophic cardiomyopathy ([place]) 02/24/2014  . Tobacco abuse 02/24/2014  . Essential hypertension 12/12/2008  . CHEST PAIN-UNSPECIFIED 12/12/2008  . ABNORMAL EKG 12/12/2008    Ailene Ravel, OTR/L,CBIS  [phone]   01/02/2017, 4:35 PM  [place] [address] [place], [place], [address] Phone: [phone]   Fax:  [phone]  Name: [name] Foster MRN: [id number] Date of Birth: Jul 29, 1956

## 2017-01-04 ENCOUNTER — Encounter (HOSPITAL_COMMUNITY): Payer: Self-pay

## 2017-01-04 ENCOUNTER — Ambulatory Visit (HOSPITAL_COMMUNITY): Payer: Worker's Compensation

## 2017-01-04 DIAGNOSIS — M25612 Stiffness of left shoulder, not elsewhere classified: Secondary | ICD-10-CM

## 2017-01-04 DIAGNOSIS — M25512 Pain in left shoulder: Secondary | ICD-10-CM

## 2017-01-04 DIAGNOSIS — R29898 Other symptoms and signs involving the musculoskeletal system: Secondary | ICD-10-CM

## 2017-01-04 NOTE — Therapy (Signed)
[facility] [place], [place], [address] Phone: [phone]   Fax:  [phone]  Occupational Therapy Treatment  Patient Details  Name: [name] Foster MRN: [id number] Date of Birth: July 22, 1956 Referring Provider: Dr. Tania Ade  Encounter Date: 01/04/2017      OT End of Session - 01/04/17 1510    Visit Number 31   Number of Visits 32   Date for OT Re-Evaluation 01/06/17   Authorization Type Workers comp   Authorization Time Period Worker's Comp has approved 12 more visits on 12/08/2016   Authorization - Visit Number 21   Authorization - Number of Visits 26   OT Start Time 1430   OT Stop Time 1515   OT Time Calculation (min) 45 min   Activity Tolerance Patient tolerated treatment well   Behavior During Therapy WFL for tasks assessed/performed      Past Medical History:  Diagnosis Date  . Abnormal EKG   . Apical variant hypertrophic cardiomyopathy ([place])   . Benign essential hypertension   . BPH (benign prostatic hyperplasia)   . Hypokalemia   . Tobacco abuse     Past Surgical History:  Procedure Laterality Date  . CARDIAC CATHETERIZATION     2008  . COLONOSCOPY N/A 02/26/2015   Procedure: COLONOSCOPY;  Surgeon: Rogene Houston, MD;  [facility];  Service: Endoscopy;  Laterality: N/A;  830  . KIDNEY SURGERY Right April 2015   benign tumor removal  . PROSTATE ABLATION  12-2015 at [facility]  . SHOULDER ACROMIOPLASTY Left 01/18/2016   Procedure: SHOULDER ACROMIOPLASTY;  Surgeon: Milly Jakob, MD;  Location: [facility];  Service: Orthopedics;  Laterality: Left;  . SHOULDER ARTHROSCOPY WITH ROTATOR CUFF REPAIR AND SUBACROMIAL DECOMPRESSION Left 01/18/2016   Procedure: LEFT SHOULDER ARTHROSCOPY WITH ROTATOR CUFF REPAIR AND SUBACROMIAL DECOMPRESSION;  Surgeon: Milly Jakob, MD;  Location: [facility];  Service: Orthopedics;  Laterality: Left;  PRE-OP BLOCK WITH GENERAL ANESTHESIA     There were no vitals filed for this visit.          [facility] OT Assessment - 01/04/17 1454      Assessment   Diagnosis Left capsular release s/p Left RCR     Precautions   Precautions None                  OT Treatments/Exercises (OP) - 01/04/17 1454      Exercises   Exercises Shoulder     Shoulder Exercises: Prone   Retraction Strengthening;12 reps   Retraction Weight (lbs) 1   Flexion Strengthening;12 reps   Flexion Weight (lbs) 1   Extension Strengthening;12 reps   Extension Weight (lbs) 1   External Rotation Strengthening;12 reps   External Rotation Weight (lbs) 1   Internal Rotation Strengthening;12 reps   Internal Rotation Weight (lbs) 1   Horizontal ABduction 1 Strengthening;12 reps   Horizontal ABduction 1 Weight (lbs) 1   Horizontal ABduction 2 Strengthening;12 reps   Horizontal ABduction 2 Weight (lbs) 1   Other Prone Exercises Scaption; 12X' 1#     Shoulder Exercises: Standing   Protraction Theraband;15 reps   Theraband Level (Shoulder Protraction) Level 2 (Red)   External Rotation Theraband;15 reps   Theraband Level (Shoulder External Rotation) Level 2 (Red)   Internal Rotation Theraband;15 reps   Theraband Level (Shoulder Internal Rotation) Level 2 (Red)   Flexion Theraband;15 reps   Theraband Level (Shoulder Flexion) Level 2 (Red)   Extension Theraband;15 reps  Theraband Level (Shoulder Extension) Level 2 (Red)     Shoulder Exercises: ROM/Strengthening   UBE (Upper Arm Bike) Level 3: 2' reverse 2' forward   Cybex Press 1 plate;15 reps   Cybex Row 1 plate;15 reps   Over Head Lace 2'   Wall Pushups --  12X   Other ROM/Strengthening Exercises Underhand weighted ball toss (green) completed using trampoline. 120X     Functional Reaching Activities   Mid Level Pt complete funcntional reaching task in which he used cones and various weight items to focus on shoulder stability during reaching tasks at home.                   OT Short Term Goals - 11/22/16 1441      OT SHORT TERM GOAL #1   Title Pt will be educated on HEP.    Time 4   Period Weeks     OT SHORT TERM GOAL #2   Title Pt will decrease pain in LUE to 4/10 or less to improve ability to complete daily tasks.   Time 4   Period Weeks   Status On-going     OT SHORT TERM GOAL #3   Title Pt will decrease LUE fascial restrictions to min amounts to improve mobility in LUE during functional tasks.    Time 4   Period Weeks     OT SHORT TERM GOAL #4   Title Pt will improve A/ROM of LUE by 50% to improve ability to use LUE during ADL completion.    Time 4   Period Weeks   Status Partially Met     OT SHORT TERM GOAL #5   Title Pt will improve LUE strength to 3/5 to increase ability to use LUE as assist during ADL and work tasks.   Time 4   Period Weeks           OT Long Term Goals - 11/22/16 1441      OT LONG TERM GOAL #1   Title Patient will use his left arm to reach to items at or slightly above shoulder height during ADL completion.     Time 8   Period Weeks   Status On-going     OT LONG TERM GOAL #2   Title Patient will improve left shoulder A/ROM to 100 degrees or better flexion and abduction and 20 degrees external rotation in order to use left arm with reaching and dressing tasks.    Time 8   Period Weeks   Status On-going     OT LONG TERM GOAL #3   Title Patient will improve left shoulder strength to 4-/5 in given range for increased ability to competing functional lifting activities.    Time 8   Period Weeks   Status On-going               Plan - 01/04/17 1511    Clinical Impression Statement A: Patient continues to have limited strength and ROM during shoulder reaching and daily tasks. Continued to focus on functional tasks and allowing patient to complete them as best as possible using LUE. Focused on low level reaching and strengthening within ROM. VC for form and technique.    Plan P: reassess and recommend  that patient follow up with MD for next step of treatment for LUE.       Patient will benefit from skilled therapeutic intervention in order to improve the following deficits and impairments:  Decreased activity tolerance, Decreased strength,  Impaired flexibility, Decreased range of motion, Pain, Increased fascial restricitons, Impaired UE functional use  Visit Diagnosis: Stiffness of left shoulder, not elsewhere classified  Acute pain of left shoulder  Other symptoms and signs involving the musculoskeletal system    Problem List Patient Active Problem List   Diagnosis Date Noted  . Normal coronary arteries 2008 09/02/2016  . H/O renal cell cancer 09/02/2016  . Hypokalemia   . Preop cardiovascular exam 02/24/2014  . Apical variant hypertrophic cardiomyopathy ([place]) 02/24/2014  . Tobacco abuse 02/24/2014  . Essential hypertension 12/12/2008  . CHEST PAIN-UNSPECIFIED 12/12/2008  . ABNORMAL EKG 12/12/2008   Ailene Ravel, OTR/L,CBIS  [phone]  01/04/2017, 4:49 PM  [facility] [address] [place], [place], [address] Phone: [phone]   Fax:  [phone]  Name: [name] Foster MRN: [id number] Date of Birth: Oct 01, 1956

## 2017-01-06 ENCOUNTER — Ambulatory Visit (HOSPITAL_COMMUNITY): Payer: Worker's Compensation

## 2017-01-06 ENCOUNTER — Encounter (HOSPITAL_COMMUNITY): Payer: Self-pay

## 2017-01-06 DIAGNOSIS — M25512 Pain in left shoulder: Secondary | ICD-10-CM

## 2017-01-06 DIAGNOSIS — M25612 Stiffness of left shoulder, not elsewhere classified: Secondary | ICD-10-CM | POA: Diagnosis not present

## 2017-01-06 DIAGNOSIS — R29898 Other symptoms and signs involving the musculoskeletal system: Secondary | ICD-10-CM

## 2017-01-06 NOTE — Therapy (Signed)
[facility] [place], [place], [address] Phone: [phone]   Fax:  [phone]  Occupational Therapy Treatment And reassessment/discharge Patient Details  Name: [name] Foster MRN: [id number] Date of Birth: 1956/03/09 Referring Provider: Dr. Tania Ade  Encounter Date: 01/06/2017      OT End of Session - 01/06/17 1451    Visit Number 32   Number of Visits 62   Authorization Type Workers comp   Authorization Time Period Worker's Comp has approved 12 more visits on 12/08/2016   Authorization - Visit Number 68   Authorization - Number of Visits 26   OT Start Time 6387  reassessment/discharge   OT Stop Time 1430   OT Time Calculation (min) 43 min   Activity Tolerance Patient tolerated treatment well   Behavior During Therapy WFL for tasks assessed/performed      Past Medical History:  Diagnosis Date  . Abnormal EKG   . Apical variant hypertrophic cardiomyopathy ([place])   . Benign essential hypertension   . BPH (benign prostatic hyperplasia)   . Hypokalemia   . Tobacco abuse     Past Surgical History:  Procedure Laterality Date  . CARDIAC CATHETERIZATION     2008  . COLONOSCOPY N/A 02/26/2015   Procedure: COLONOSCOPY;  Surgeon: Rogene Houston, MD;  [facility];  Service: Endoscopy;  Laterality: N/A;  830  . KIDNEY SURGERY Right April 2015   benign tumor removal  . PROSTATE ABLATION  12-2015 at [facility]  . SHOULDER ACROMIOPLASTY Left 01/18/2016   Procedure: SHOULDER ACROMIOPLASTY;  Surgeon: Milly Jakob, MD;  Location: [facility];  Service: Orthopedics;  Laterality: Left;  . SHOULDER ARTHROSCOPY WITH ROTATOR CUFF REPAIR AND SUBACROMIAL DECOMPRESSION Left 01/18/2016   Procedure: LEFT SHOULDER ARTHROSCOPY WITH ROTATOR CUFF REPAIR AND SUBACROMIAL DECOMPRESSION;  Surgeon: Milly Jakob, MD;  Location: [facility];  Service: Orthopedics;  Laterality: Left;  PRE-OP BLOCK WITH GENERAL  ANESTHESIA    There were no vitals filed for this visit.      Subjective Assessment - 01/06/17 1449    Subjective  S: The pain is always there. It's never lower than a 5. Sometimes it is higher.    Currently in Pain? Yes   Pain Score 5    Pain Location Shoulder   Pain Orientation Left   Pain Descriptors / Indicators Aching   Pain Type Acute pain   Pain Radiating Towards shoulder down to deltoid   Pain Onset More than a month ago   Pain Frequency Constant   Aggravating Factors  movement and cold   Pain Relieving Factors rest, positioning, heat   Effect of Pain on Daily Activities max difficulty to complete ADL tasks.   Multiple Pain Sites No            [facility] OT Assessment - 01/06/17 1350      Assessment   Diagnosis Left capsular release s/p Left RCR   Onset Date 09/08/16     Precautions   Precautions None     Prior Function   Level of Independence Independent   Vocation Full time employment     AROM   Overall AROM Comments Assessed seated, er/IR adducted   AROM Assessment Site Shoulder   Right/Left Shoulder Left   Left Shoulder Flexion 75 Degrees  previous: 67   Left Shoulder ABduction 75 Degrees  previous: 67   Left Shoulder Internal Rotation 90 Degrees  previous: same   Left Shoulder External Rotation  10 Degrees  previous: 7     PROM   Overall PROM Comments Assessed supine, er/IR adducted   PROM Assessment Site Shoulder   Right/Left Shoulder Left   Left Shoulder Flexion 121 Degrees  previous: 121   Left Shoulder ABduction 135 Degrees  previous: 137   Left Shoulder Internal Rotation 90 Degrees  previous: same   Left Shoulder External Rotation 40 Degrees  previous: 36     Strength   Overall Strength Comments Range is not [facility] which makes patient's strength score automatically 3-/5 seated against gravity for flexion, abduction, and external rotation. Internal rotation is 5/5.                   OT Treatments/Exercises (OP) - 01/06/17 1415       Exercises   Exercises Shoulder     Shoulder Exercises: ROM/Strengthening   UBE (Upper Arm Bike) Level 3: 2' reverse 2' forward   Over Head Lace 2'   Ball on Wall 1' flexion 1' abduction with light Sa[place]o ball                  OT Short Term Goals - 01/06/17 1401      OT SHORT TERM GOAL #1   Title Pt will be educated on HEP.    Time 4   Period Weeks     OT SHORT TERM GOAL #2   Title Pt will decrease pain in LUE to 4/10 or less to improve ability to complete daily tasks.   Time 4   Period Weeks   Status Not Met     OT SHORT TERM GOAL #3   Title Pt will decrease LUE fascial restrictions to min amounts to improve mobility in LUE during functional tasks.    Time 4   Period Weeks     OT SHORT TERM GOAL #4   Title Pt will improve A/ROM of LUE by 50% to improve ability to use LUE during ADL completion.    Time 4   Period Weeks   Status Not Met     OT SHORT TERM GOAL #5   Title Pt will improve LUE strength to 3/5 to increase ability to use LUE as assist during ADL and work tasks.   Time 4   Period Weeks           OT Long Term Goals - 01/06/17 1403      OT LONG TERM GOAL #1   Title Patient will use his left arm to reach to items at or slightly above shoulder height during ADL completion.     Baseline 3/9: Patient is able to complete at shoulder level with nothing heavier than a 1#.    Time 8   Period Weeks   Status Achieved     OT LONG TERM GOAL #2   Title Patient will improve left shoulder A/ROM to 100 degrees or better flexion and abduction and 20 degrees external rotation in order to use left arm with reaching and dressing tasks.    Time 8   Period Weeks   Status Partially Met     OT LONG TERM GOAL #3   Title Patient will improve left shoulder strength to 4-/5 in given range for increased ability to competing functional lifting activities.    Time 8   Period Weeks   Status Not Met               Plan - 01/06/17 1451    Clinical  Impression Statement A: Reassessment was completed this date. patient has shown very little to no improvement with Active and Passive ROM measurements from previous reassessment. Overall, patient has met 3/5 short term goals and 1/3 long term goals. Patient is able to complete low level reaching/waist level activities functionally. He is able to complete mid range/just below shoulder level reaching with items 1# and lower. Patient voice pain with any mid level reaching task. Patient reports that his pain level is constantly at 5/10 or more. Patient is required to sleep on his stomach with a pillow under his chest and his left arm hanging off the bed  for comfort. Last few weeks we have focused mainly on functional daily tasks and trying to maximize what range and strength he has. patient reports that he is completing his HEP at home independently and he is consistent. Discussed discharge due to patient's progress being at a plateau. Patient is in agreement with recommendation.    Plan P: Discharge from therapy with HEP. Follow up with Dr. Tamera Punt on 01/11/17 to further discuss the next step of treatment. Encouraged patient to write down any questions and topics that he'd like to discuss with Dr. Tamera Punt prior to his appointment.    Consulted and Agree with Plan of Care Patient      Patient will benefit from skilled therapeutic intervention in order to improve the following deficits and impairments:  Decreased activity tolerance, Decreased strength, Impaired flexibility, Decreased range of motion, Pain, Increased fascial restricitons, Impaired UE functional use  Visit Diagnosis: Stiffness of left shoulder, not elsewhere classified  Acute pain of left shoulder  Other symptoms and signs involving the musculoskeletal system    Problem List Patient Active Problem List   Diagnosis Date Noted  . Normal coronary arteries 2008 09/02/2016  . H/O renal cell cancer 09/02/2016  . Hypokalemia   . Preop  cardiovascular exam 02/24/2014  . Apical variant hypertrophic cardiomyopathy ([place]) 02/24/2014  . Tobacco abuse 02/24/2014  . Essential hypertension 12/12/2008  . CHEST PAIN-UNSPECIFIED 12/12/2008  . ABNORMAL EKG 12/12/2008    OCCUPATIONAL THERAPY DISCHARGE SUMMARY  Visits from Start of Care: 32 Current functional level related to goals / functional outcomes: See above   Remaining deficits: See above   Education / Equipment: Patient was instructed to continue to complete HEP that has been established to maintain the range of motion and strength that he has gained while attending therapy. Patient's HEP includes: Scapular and shoulder strengthening with red theraband and low 1# weight. Plan: Patient agrees to discharge.  Patient goals were partially met. Patient is being discharged due to lack of progress.  ?????        Ailene Ravel, OTR/L,CBIS  [phone]  01/06/2017, 3:05 PM  [facility] [address] [place], [place], [address] Phone: [phone]   Fax:  [phone]  Name: [name] Foster MRN: [id number] Date of Birth: January 08, 1956

## 2017-01-09 ENCOUNTER — Ambulatory Visit (HOSPITAL_COMMUNITY): Payer: Worker's Compensation

## 2017-01-11 ENCOUNTER — Encounter (HOSPITAL_COMMUNITY): Payer: Self-pay

## 2017-02-06 ENCOUNTER — Encounter (INDEPENDENT_AMBULATORY_CARE_PROVIDER_SITE_OTHER): Payer: Self-pay

## 2017-02-06 ENCOUNTER — Encounter: Payer: Self-pay | Admitting: Family Medicine

## 2017-02-06 ENCOUNTER — Ambulatory Visit (INDEPENDENT_AMBULATORY_CARE_PROVIDER_SITE_OTHER): Payer: BLUE CROSS/BLUE SHIELD | Admitting: Family Medicine

## 2017-02-06 VITALS — BP 106/73 | HR 76 | Temp 98.1°F | Ht 70.0 in | Wt 166.0 lb

## 2017-02-06 DIAGNOSIS — M25511 Pain in right shoulder: Secondary | ICD-10-CM

## 2017-02-06 DIAGNOSIS — M25512 Pain in left shoulder: Secondary | ICD-10-CM

## 2017-02-06 DIAGNOSIS — I10 Essential (primary) hypertension: Secondary | ICD-10-CM

## 2017-02-06 DIAGNOSIS — Z1159 Encounter for screening for other viral diseases: Secondary | ICD-10-CM | POA: Diagnosis not present

## 2017-02-06 DIAGNOSIS — Z13220 Encounter for screening for lipoid disorders: Secondary | ICD-10-CM

## 2017-02-06 DIAGNOSIS — Z0000 Encounter for general adult medical examination without abnormal findings: Secondary | ICD-10-CM | POA: Diagnosis not present

## 2017-02-06 DIAGNOSIS — G8929 Other chronic pain: Secondary | ICD-10-CM | POA: Diagnosis not present

## 2017-02-06 DIAGNOSIS — Z720 Tobacco use: Secondary | ICD-10-CM

## 2017-02-06 DIAGNOSIS — Z114 Encounter for screening for human immunodeficiency virus [HIV]: Secondary | ICD-10-CM

## 2017-02-06 MED ORDER — POTASSIUM CHLORIDE CRYS ER 20 MEQ PO TBCR: 20.0000 meq | EXTENDED_RELEASE_TABLET | Freq: Every day | ORAL | 1 refills | Status: DC

## 2017-02-06 MED ORDER — HYDROCHLOROTHIAZIDE 25 MG PO TABS: 25.0000 mg | ORAL_TABLET | Freq: Every day | ORAL | 1 refills | Status: DC

## 2017-02-06 MED ORDER — METOPROLOL TARTRATE 25 MG PO TABS: 25.0000 mg | ORAL_TABLET | Freq: Two times a day (BID) | ORAL | 1 refills | Status: DC

## 2017-02-06 MED ORDER — MELOXICAM 15 MG PO TABS: 15.0000 mg | ORAL_TABLET | Freq: Every day | ORAL | 0 refills | Status: DC

## 2017-02-06 MED ORDER — BUPROPION HCL ER (SR) 150 MG PO TB12: 150.0000 mg | ORAL_TABLET | Freq: Two times a day (BID) | ORAL | 1 refills | Status: DC

## 2017-02-06 MED ORDER — SILDENAFIL CITRATE 20 MG PO TABS: ORAL_TABLET | ORAL | 0 refills | Status: DC

## 2017-02-06 NOTE — Progress Notes (Signed)
BP 106/73   Pulse 76   Temp 98.1 F (36.7 C) (Oral)   Ht '5\' 10"'  (1.778 m)   Wt 166 lb (75.3 kg)   BMI 23.82 kg/m    Subjective:    Patient ID: [name] Foster, male    DOB: Apr 19, 1956, 61 y.o.   MRN: [id number]  HPI: [name] Foster is a 61 y.o. male presenting on 02/06/2017 for Annual Exam (discuss Wellbutrin to help him stop smoking; has torn rotator cuff and torn muscle in right shoulder, had work injury February 2017 and has had 2 surgeries since then - patient is experiencing pain in both shoulders, would like something to help, does not want narotics)   HPI Well adult exam and physical Patient is coming in today for well adult exam and physical and fasting labs. He is a smoker and would like to quit smoking would like to try Wellbutrin for that. He is also coming in for a hypertension recheck today and his blood pressure has been doing good and he denies any lightheadedness or dizziness or issues with the medication. Patient denies headaches, blurred vision, chest pains, shortness of breath, or weakness. Denies any side effects from medication and is content with current medication.   Shoulder pain, bilateral Patient is coming in for bilateral shoulder pain that he sustained an injury when he fell into a manhole last year. He has had 3 operations on his left shoulder which have not left him in better shape than what he was prior to the surgeries. He has limited range of motion on that left shoulder and has pain that comes especially when the weather changes or at night when he sleeping on one side or the other. His right shoulder he also tore his rotator cuff that time but due to his scarring of the issues that they've had with his left shoulder and orthopedic does not feel like he is a great candidate for the repair. He has a same kind of aches and pains intermittently and the right shoulder that he has in the left. He does have more range of motion in the right shoulder that he doesn't left. He  denies any fevers or chills or redness or warmth.  Relevant past medical, surgical, family and social history reviewed and updated as indicated. Interim medical history since our last visit reviewed. Allergies and medications reviewed and updated.  Review of Systems  Constitutional: Negative for chills and fever.  HENT: Negative for ear pain and tinnitus.   Respiratory: Negative for cough, shortness of breath and wheezing.   Cardiovascular: Negative for chest pain, palpitations and leg swelling.  Gastrointestinal: Negative for abdominal pain, blood in stool, constipation and diarrhea.  Genitourinary: Negative for dysuria and hematuria.  Musculoskeletal: Positive for arthralgias. Negative for back pain, joint swelling and myalgias.  Skin: Negative for rash.  Neurological: Negative for dizziness, weakness and headaches.  Psychiatric/Behavioral: Negative for suicidal ideas.    Per HPI unless specifically indicated above     Objective:    BP 106/73   Pulse 76   Temp 98.1 F (36.7 C) (Oral)   Ht '5\' 10"'  (1.778 m)   Wt 166 lb (75.3 kg)   BMI 23.82 kg/m   Wt Readings from Last 3 Encounters:  02/06/17 166 lb (75.3 kg)  09/02/16 164 lb (74.4 kg)  08/26/16 167 lb (75.8 kg)    Physical Exam  Constitutional: He is oriented to person, place, and time. He appears well-developed and well-nourished. No distress.  HENT:  Right Ear: External ear normal.  Left Ear: External ear normal.  Nose: Nose normal.  Mouth/Throat: Oropharynx is clear and moist. No oropharyngeal exudate.  Eyes: Conjunctivae and EOM are normal. Pupils are equal, round, and reactive to light. Right eye exhibits no discharge. No scleral icterus.  Neck: Neck supple. No thyromegaly present.  Cardiovascular: Normal rate, regular rhythm, normal heart sounds and intact distal pulses.   No murmur heard. Pulmonary/Chest: Effort normal and breath sounds normal. No respiratory distress. He has no wheezes.  Abdominal: Soft. Bowel  sounds are normal. He exhibits no distension. There is no tenderness. There is no rebound and no guarding.  Musculoskeletal: Normal range of motion. He exhibits no edema or tenderness (No muscular tenderness in either shoulder on exam, pain with overhead range of motion. Significantly decreased overhead ROM).  Lymphadenopathy:    He has no cervical adenopathy.  Neurological: He is alert and oriented to person, place, and time. Coordination normal.  Skin: Skin is warm and dry. No rash noted. He is not diaphoretic.  Psychiatric: He has a normal mood and affect. His behavior is normal.  Vitals reviewed.     Assessment & Plan:   Problem List Items Addressed This Visit      Cardiovascular and Mediastinum   Essential hypertension   Relevant Medications   hydrochlorothiazide (HYDRODIURIL) 25 MG tablet   metoprolol tartrate (LOPRESSOR) 25 MG tablet   potassium chloride SA (K-DUR,KLOR-CON) 20 MEQ tablet   sildenafil (REVATIO) 20 MG tablet   Other Relevant Orders   CMP14+EGFR     Other   Tobacco abuse   Relevant Medications   buPROPion (WELLBUTRIN SR) 150 MG 12 hr tablet    Other Visit Diagnoses    Well adult exam    -  Primary   Relevant Orders   Lipid panel   CMP14+EGFR   HIV antibody   Hepatitis C antibody   Need for hepatitis C screening test       Relevant Orders   Hepatitis C antibody   Screening for HIV without presence of risk factors       Relevant Orders   HIV antibody   Lipid screening       Relevant Orders   Lipid panel   Chronic left shoulder pain       Relevant Medications   meloxicam (MOBIC) 15 MG tablet   Chronic right shoulder pain       Relevant Medications   meloxicam (MOBIC) 15 MG tablet       Follow up plan: Return in about 1 year (around 02/06/2018), or if symptoms worsen or fail to improve.  Counseling provided for all of the vaccine components Orders Placed This Encounter  Procedures  . Lipid panel  . CMP14+EGFR  . HIV antibody  . Hepatitis C  antibody    Caryl Pina, MD [facility] 02/06/2017, 2:25 PM

## 2017-02-07 LAB — LIPID PANEL
CHOL/HDL RATIO: 4.3 ratio (ref 0.0–5.0)
CHOLESTEROL TOTAL: 147 mg/dL (ref 100–199)
HDL: 34 mg/dL — ABNORMAL LOW (ref >39)
LDL CALC: 85 mg/dL (ref 0–99)
Triglycerides: 140 mg/dL (ref 0–149)
VLDL CHOLESTEROL CAL: 28 mg/dL (ref 5–40)

## 2017-02-07 LAB — CMP14+EGFR
ALK PHOS: 111 IU/L (ref 39–117)
ALT: 29 IU/L (ref 0–44)
AST: 19 IU/L (ref 0–40)
Albumin/Globulin Ratio: 1.8 (ref 1.2–2.2)
Albumin: 4.6 g/dL (ref 3.5–5.5)
BUN/Creatinine Ratio: 15 (ref 9–20)
BUN: 14 mg/dL (ref 6–24)
Bilirubin Total: 0.3 mg/dL (ref 0.0–1.2)
CALCIUM: 9.9 mg/dL (ref 8.7–10.2)
CO2: 27 mmol/L (ref 18–29)
CREATININE: 0.91 mg/dL (ref 0.76–1.27)
Chloride: 99 mmol/L (ref 96–106)
GFR calc Af Amer: 107 mL/min/{1.73_m2} (ref >59)
GFR, EST NON AFRICAN AMERICAN: 93 mL/min/{1.73_m2} (ref >59)
GLOBULIN, TOTAL: 2.6 g/dL (ref 1.5–4.5)
GLUCOSE: 95 mg/dL (ref 65–99)
Potassium: 4.2 mmol/L (ref 3.5–5.2)
SODIUM: 140 mmol/L (ref 134–144)
Total Protein: 7.2 g/dL (ref 6.0–8.5)

## 2017-02-07 LAB — HIV ANTIBODY (ROUTINE TESTING W REFLEX): HIV SCREEN 4TH GENERATION: NONREACTIVE

## 2017-02-07 LAB — HEPATITIS C ANTIBODY

## 2017-04-05 ENCOUNTER — Other Ambulatory Visit: Payer: Self-pay | Admitting: Family Medicine

## 2017-05-31 ENCOUNTER — Other Ambulatory Visit: Payer: Self-pay | Admitting: Family Medicine

## 2017-05-31 DIAGNOSIS — Z720 Tobacco use: Secondary | ICD-10-CM

## 2017-08-30 ENCOUNTER — Other Ambulatory Visit: Payer: Self-pay | Admitting: Family Medicine

## 2017-08-30 DIAGNOSIS — I10 Essential (primary) hypertension: Secondary | ICD-10-CM

## 2017-11-15 ENCOUNTER — Encounter: Payer: Self-pay | Admitting: Family Medicine

## 2017-11-15 ENCOUNTER — Ambulatory Visit: Payer: BLUE CROSS/BLUE SHIELD | Admitting: Family Medicine

## 2017-11-15 VITALS — BP 117/83 | HR 94 | Temp 97.8°F | Ht 70.0 in | Wt 170.0 lb

## 2017-11-15 DIAGNOSIS — G8929 Other chronic pain: Secondary | ICD-10-CM | POA: Diagnosis not present

## 2017-11-15 DIAGNOSIS — M25512 Pain in left shoulder: Secondary | ICD-10-CM | POA: Diagnosis not present

## 2017-11-15 DIAGNOSIS — M25511 Pain in right shoulder: Secondary | ICD-10-CM

## 2017-11-15 DIAGNOSIS — M19012 Primary osteoarthritis, left shoulder: Secondary | ICD-10-CM | POA: Diagnosis not present

## 2017-11-15 DIAGNOSIS — M19011 Primary osteoarthritis, right shoulder: Secondary | ICD-10-CM

## 2017-11-15 MED ORDER — MELOXICAM 15 MG PO TABS: 15.0000 mg | ORAL_TABLET | Freq: Every day | ORAL | 0 refills | Status: DC

## 2017-11-15 NOTE — Progress Notes (Signed)
BP 117/83   Pulse 94   Temp 97.8 F (36.6 C) (Oral)   Ht 5\' 10"  (1.778 m)   Wt 170 lb (77.1 kg)   BMI 24.39 kg/m    Subjective:    Patient ID: [name] Foster, male    DOB: [birth year], 62 y.o.   MRN: [id number]  HPI: [name] Foster is a 62 y.o. male presenting on 11/15/2017 for Bilateral shoulder pain (would like refill of Meloxicam)   HPI Chronic bilateral shoulder pain/arthritis Patient has been having continued chronic bilateral shoulder pain and arthritis that he has been using meloxicam for in the meloxicam really helped some.  He has been out of it for a couple weeks now and is coming in for refill because he he wants it back and it does help him a lot.  He denies any gastric symptoms or blood in his stool.  He denies any other side effects from the medication.  The pain does not radiate anywhere else except for in the shoulder joints on both sides hurts more with overhead range of motion.  He rates the pain as mild to moderate but sometimes does wake him up for night time if he does not use the medication.  Relevant past medical, surgical, family and social history reviewed and updated as indicated. Interim medical history since our last visit reviewed. Allergies and medications reviewed and updated.  Review of Systems  Constitutional: Negative for chills and fever.  Respiratory: Negative for shortness of breath and wheezing.   Cardiovascular: Negative for chest pain and leg swelling.  Musculoskeletal: Positive for arthralgias. Negative for back pain, gait problem and joint swelling.  Skin: Negative for rash.  All other systems reviewed and are negative.   Per HPI unless specifically indicated above   Allergies as of 11/15/2017   No Known Allergies     Medication List        Accurate as of 11/15/17 11:27 AM. Always use your most recent med list.          buPROPion 150 MG 12 hr tablet Commonly known as:  WELLBUTRIN SR TAKE ONE TABLET BY MOUTH TWICE DAILY.   Fish Oil  1000 MG Caps Take 1,000 mg by mouth daily.   hydrochlorothiazide 25 MG tablet Commonly known as:  HYDRODIURIL TAKE ONE TABLET BY MOUTH DAILY.   meloxicam 15 MG tablet Commonly known as:  MOBIC Take 1 tablet (15 mg total) by mouth daily.   metoprolol tartrate 25 MG tablet Commonly known as:  LOPRESSOR Take 1 tablet (25 mg total) by mouth 2 (two) times daily.   MULTIVITAMIN PO Take 1 tablet by mouth daily.   potassium chloride SA 20 MEQ tablet Commonly known as:  K-DUR,KLOR-CON TAKE ONE TABLET BY MOUTH DAILY.   sildenafil 20 MG tablet Commonly known as:  REVATIO TAKE 1 TO 2 TABLETS BY MOUTH AS NEEDED          Objective:    BP 117/83   Pulse 94   Temp 97.8 F (36.6 C) (Oral)   Ht 5\' 10"  (1.778 m)   Wt 170 lb (77.1 kg)   BMI 24.39 kg/m   Wt Readings from Last 3 Encounters:  11/15/17 170 lb (77.1 kg)  02/06/17 166 lb (75.3 kg)  09/02/16 164 lb (74.4 kg)    Physical Exam  Constitutional: He is oriented to person, place, and time. He appears well-developed and well-nourished. No distress.  Eyes: Conjunctivae are normal. No scleral icterus.  Cardiovascular: Normal rate, regular rhythm,  normal heart sounds and intact distal pulses.  No murmur heard. Pulmonary/Chest: Effort normal and breath sounds normal. No respiratory distress. He has no wheezes.  Musculoskeletal: Normal range of motion. He exhibits no edema.       Right shoulder: He exhibits tenderness, bony tenderness and crepitus. He exhibits normal range of motion, no swelling, no deformity and normal strength.       Left shoulder: He exhibits tenderness, bony tenderness and crepitus. He exhibits normal range of motion, no swelling, no deformity and normal strength.  Neurological: He is alert and oriented to person, place, and time. Coordination normal.  Skin: Skin is warm and dry. No rash noted. He is not diaphoretic.  Psychiatric: He has a normal mood and affect. His behavior is normal.  Nursing note and vitals  reviewed.       Assessment & Plan:   Problem List Items Addressed This Visit    None    Visit Diagnoses    Bilateral shoulder region arthritis    -  Primary   Relevant Medications   meloxicam (MOBIC) 15 MG tablet   Chronic left shoulder pain       Relevant Medications   meloxicam (MOBIC) 15 MG tablet   Chronic right shoulder pain       Relevant Medications   meloxicam (MOBIC) 15 MG tablet       Follow up plan: Return in about 3 months (around 02/13/2018), or if symptoms worsen or fail to improve, for Well physical and recheck on blood pressure.  Counseling provided for all of the vaccine components No orders of the defined types were placed in this encounter.   Caryl Pina, MD [facility] 11/15/2017, 11:27 AM

## 2017-12-08 ENCOUNTER — Ambulatory Visit: Payer: BLUE CROSS/BLUE SHIELD | Admitting: Cardiology

## 2017-12-08 ENCOUNTER — Telehealth: Payer: Self-pay | Admitting: Cardiology

## 2017-12-08 ENCOUNTER — Encounter: Payer: Self-pay | Admitting: Cardiology

## 2017-12-08 ENCOUNTER — Encounter: Payer: Self-pay | Admitting: *Deleted

## 2017-12-08 VITALS — BP 115/78 | HR 87 | Ht 70.0 in | Wt 170.0 lb

## 2017-12-08 DIAGNOSIS — R9431 Abnormal electrocardiogram [ECG] [EKG]: Secondary | ICD-10-CM

## 2017-12-08 DIAGNOSIS — R079 Chest pain, unspecified: Secondary | ICD-10-CM | POA: Diagnosis not present

## 2017-12-08 NOTE — Telephone Encounter (Signed)
Pre-cert Verification for the following procedure   Stress Echo scheduled 12/14/17 at [facility]

## 2017-12-08 NOTE — Patient Instructions (Addendum)
Medication Instructions:   Your physician recommends that you continue on your current medications as directed. Please refer to the Current Medication list given to you today.  Labwork:  NONE  Testing/Procedures: Your physician has requested that you have a stress echocardiogram. For further information please visit [email or web address]. Please follow instruction sheet as given.  Follow-Up:  Your physician recommends that you schedule a follow-up appointment in: 6 weeks.  Any Other Special Instructions Will Be Listed Below (If Applicable).  If you need a refill on your cardiac medications before your next appointment, please call your pharmacy.

## 2017-12-08 NOTE — Progress Notes (Signed)
Clinical Summary Mr. [name] Foster is a 62 y.o.male last seen by PA Kilroy in 2017, this is our first visit together. He is seen for the following medical problems.   1. Chest pain - cath 2008 with normal coronaries, LVEF 65%. Cath showed questionable apical hypokinesys vs hypertrophy. Follow up echo report is reported as normal.   - 1-2 times a month, feeling of heart squeezing midchest. 5/10 in severity. Can get diaphoretic. Can feel nauseous. Can occur at rest or with activity. Lasts about 3 minutes. Worst with deep breath. Occurs 1-2 times a month - stationary bike daily x 30 minutes, no symptoms. - similar to his prior chest pain - stress test limited due to he cannot raise his arms above his head due to prior shoulder injury.    CAD risk factors: +tobacco, HTN     2. Apical abnormality - Cath showed questionable apical hypokinesys vs hypertrophy. Echo is reported as normal - EKG is in pattern common to apical hypertrophic CM, however this diagnosis hsa not been confirmed.      Past Medical History:  Diagnosis Date  . Abnormal EKG   . Apical variant hypertrophic cardiomyopathy ([place])   . Benign essential hypertension   . BPH (benign prostatic hyperplasia)   . Hypokalemia   . Tobacco abuse      No Known Allergies   Current Outpatient Medications  Medication Sig Dispense Refill  . buPROPion (WELLBUTRIN SR) 150 MG 12 hr tablet TAKE ONE TABLET BY MOUTH TWICE DAILY. 60 tablet 0  . hydrochlorothiazide (HYDRODIURIL) 25 MG tablet TAKE ONE TABLET BY MOUTH DAILY. 90 tablet 0  . meloxicam (MOBIC) 15 MG tablet Take 1 tablet (15 mg total) by mouth daily. 30 tablet 0  . metoprolol tartrate (LOPRESSOR) 25 MG tablet Take 1 tablet (25 mg total) by mouth 2 (two) times daily. 180 tablet 1  . Multiple Vitamins-Minerals (MULTIVITAMIN PO) Take 1 tablet by mouth daily.    . Omega-3 Fatty Acids (FISH OIL) 1000 MG CAPS Take 1,000 mg by mouth daily.    . potassium chloride SA (K-DUR,KLOR-CON)  20 MEQ tablet TAKE ONE TABLET BY MOUTH DAILY. 90 tablet 0  . sildenafil (REVATIO) 20 MG tablet TAKE 1 TO 2 TABLETS BY MOUTH AS NEEDED 30 tablet 2   No current facility-administered medications for this visit.      Past Surgical History:  Procedure Laterality Date  . CARDIAC CATHETERIZATION     2008  . COLONOSCOPY N/A 02/26/2015   Procedure: COLONOSCOPY;  Surgeon: Rogene Houston, MD;  [facility];  Service: Endoscopy;  Laterality: N/A;  830  . KIDNEY SURGERY Right April 2015   benign tumor removal  . PROSTATE ABLATION  12-2015 at [facility]  . SHOULDER ACROMIOPLASTY Left 01/18/2016   Procedure: SHOULDER ACROMIOPLASTY;  Surgeon: Milly Jakob, MD;  Location: [facility];  Service: Orthopedics;  Laterality: Left;  . SHOULDER ARTHROSCOPY WITH ROTATOR CUFF REPAIR AND SUBACROMIAL DECOMPRESSION Left 01/18/2016   Procedure: LEFT SHOULDER ARTHROSCOPY WITH ROTATOR CUFF REPAIR AND SUBACROMIAL DECOMPRESSION;  Surgeon: Milly Jakob, MD;  Location: [facility];  Service: Orthopedics;  Laterality: Left;  PRE-OP BLOCK WITH GENERAL ANESTHESIA     No Known Allergies    Family History  Problem Relation Age of Onset  . Diabetes Mother   . Diabetes Other   . Hypertension Other      Social History Mr. [name] Foster reports that he has been smoking cigarettes.  He has a 28.00  pack-year smoking history. he has never used smokeless tobacco. Mr. [name] Foster reports that he does not drink alcohol.   Review of Systems CONSTITUTIONAL: No weight loss, fever, chills, weakness or fatigue.  HEENT: Eyes: No visual loss, blurred vision, double vision or yellow sclerae.No hearing loss, sneezing, congestion, runny nose or sore throat.  SKIN: No rash or itching.  CARDIOVASCULAR: per hpi RESPIRATORY: per hpi GASTROINTESTINAL: No anorexia, nausea, vomiting or diarrhea. No abdominal pain or blood.  GENITOURINARY: No burning on urination, no polyuria NEUROLOGICAL: No headache, dizziness,  syncope, paralysis, ataxia, numbness or tingling in the extremities. No change in bowel or bladder control.  MUSCULOSKELETAL: No muscle, back pain, joint pain or stiffness.  LYMPHATICS: No enlarged nodes. No history of splenectomy.  PSYCHIATRIC: No history of depression or anxiety.  ENDOCRINOLOGIC: No reports of sweating, cold or heat intolerance. No polyuria or polydipsia.  Marland Kitchen   Physical Examination Vitals:   12/08/17 1416  BP: 115/78  Pulse: 87  SpO2: 96%   Vitals:   12/08/17 1416  Weight: 170 lb (77.1 kg)  Height: 5\' 10"  (1.778 m)    Gen: resting comfortably, no acute distress HEENT: no scleral icterus, pupils equal round and reactive, no palptable cervical adenopathy,  CV: RRR, no m/r/g, no jvd Resp: Clear to auscultation bilaterally GI: abdomen is soft, non-tender, non-distended, normal bowel sounds, no hepatosplenomegaly MSK: extremities are warm, no edema.  Skin: warm, no rash Neuro:  no focal deficits Psych: appropriate affect   Diagnostic Studies 01/2014 echo Study Conclusions  - Left ventricle: The cavity size was normal. There was mild concentric hypertrophy. Systolic function was normal. The estimated ejection fraction was in the range of 60% to 65%. Wall motion was normal; there were no regional wall motion abnormalities. Doppler parameters are consistent with abnormal left ventricular relaxation (grade 1 diastolic dysfunction). The E/e' ratio is <10 , suggesting normal LV filling pressure. - Left atrium: The atrium was normal in size. - Inferior vena cava: The vessel was normal in size; the respirophasic diameter changes were in the normal range (= 50%); findings are consistent with normal central venous pressure. - Pericardium, extracardiac: There was no pericardial effusion.  03/2011 cath RESULTS:  HEMODYNAMICS:  LV 101/8, AO 118/74.   CORONARIES:  The left main was normal.  The LAD was normal.  It was a  large vessel  wrapping the apex.  The first diagonal was moderate size  and normal.  The second diagonal was small and normal.  The circumflex  in the AV groove was normal.  There was a ramus intermediate which was  moderate and normal.  There was a mid obtuse marginal which was large  and normal.  There was a posterolateral which was small and normal.  The  right coronary artery is the dominant vessel.  It was large and normal.  The PDA was moderate size normal.   LEFT VENTRICULOGRAM:  The left ventriculogram demonstrated an EF of 65%.  However, there was some apical akinesis.  There is a question of whether  this was apical hypertrophy early versus small apical akinetic  area/aneurysm related to previous infarct.   CONCLUSION:  Normal course with well preserved total ejection fraction  but apical wall motion abnormality is described.   PLAN:  The patient will continue to have medical management for  nonanginal chest pain.  I will follow up with an echocardiogram in the  months to come.   Assessment and Plan   1. Chest pain - we will  obtain an exercise stress echo to further evaluate  2. Apical abnormality - prior cath and EKG would suggest apical hypertrophy, echo did not confirm this previously - consider cardiac MRI in the near future.   F/u 6 weeks   Arnoldo Lenis, M.D.

## 2017-12-13 ENCOUNTER — Encounter: Payer: Self-pay | Admitting: Cardiology

## 2017-12-14 ENCOUNTER — Ambulatory Visit (HOSPITAL_COMMUNITY)
Admission: RE | Admit: 2017-12-14 | Discharge: 2017-12-14 | Disposition: A | Discharge status: Home / Self Care | Payer: BLUE CROSS/BLUE SHIELD | Source: Ambulatory Visit | Attending: Cardiology | Admitting: Cardiology

## 2017-12-14 DIAGNOSIS — F17210 Nicotine dependence, cigarettes, uncomplicated: Secondary | ICD-10-CM | POA: Insufficient documentation

## 2017-12-14 DIAGNOSIS — R079 Chest pain, unspecified: Secondary | ICD-10-CM | POA: Diagnosis present

## 2017-12-14 DIAGNOSIS — I10 Essential (primary) hypertension: Secondary | ICD-10-CM | POA: Diagnosis not present

## 2017-12-14 DIAGNOSIS — I428 Other cardiomyopathies: Secondary | ICD-10-CM | POA: Insufficient documentation

## 2017-12-14 LAB — ECHOCARDIOGRAM STRESS TEST
CHL CUP MPHR: 161 {beats}/min
CHL CUP RESTING HR STRESS: 78 {beats}/min
CHL RATE OF PERCEIVED EXERTION: 13
CSEPEDS: 58 s
CSEPPHR: 146 {beats}/min
Estimated workload: 7 METS
Exercise duration (min): 5 min
Percent HR: 90 %

## 2017-12-14 NOTE — Progress Notes (Signed)
*  PRELIMINARY RESULTS* Echocardiogram Stress test has been performed.  Leavy Cella 12/14/2017, 10:23 AM

## 2017-12-15 ENCOUNTER — Other Ambulatory Visit (HOSPITAL_COMMUNITY): Payer: Self-pay

## 2017-12-21 ENCOUNTER — Telehealth: Payer: Self-pay | Admitting: *Deleted

## 2017-12-21 DIAGNOSIS — R079 Chest pain, unspecified: Secondary | ICD-10-CM

## 2017-12-21 NOTE — Telephone Encounter (Signed)
-----   Message from Drema Dallas, [place] sent at 12/19/2017 11:41 AM EST -----   ----- Message ----- From: Arnoldo Lenis, MD Sent: 12/19/2017  11:40 AM To: Drema Dallas, CMA  Stress test was nondiagnostic, the pictures are not clear to be able to rule out any form of blockage in the heart. Id like to obtain a different type of test to get a better look at the arteries of his heart. Can we order a coronary CTA for him to be done at [facility] for chest pain. This will also allow [place] to look at a part of the heart muscle which has appeared thickened in the past that may also requirement treatment.   Carlyle Dolly MD

## 2017-12-21 NOTE — Telephone Encounter (Signed)
Pt voiced understanding - agreeable to CTA - will place orders and forward to schedulers.

## 2018-01-02 ENCOUNTER — Other Ambulatory Visit: Payer: Self-pay | Admitting: Family Medicine

## 2018-01-02 DIAGNOSIS — M25512 Pain in left shoulder: Principal | ICD-10-CM

## 2018-01-02 DIAGNOSIS — M19012 Primary osteoarthritis, left shoulder: Secondary | ICD-10-CM

## 2018-01-02 DIAGNOSIS — G8929 Other chronic pain: Secondary | ICD-10-CM

## 2018-01-02 DIAGNOSIS — M19011 Primary osteoarthritis, right shoulder: Secondary | ICD-10-CM

## 2018-01-02 DIAGNOSIS — M25511 Pain in right shoulder: Secondary | ICD-10-CM

## 2018-01-04 ENCOUNTER — Other Ambulatory Visit: Payer: Self-pay | Admitting: Family Medicine

## 2018-01-04 DIAGNOSIS — I10 Essential (primary) hypertension: Secondary | ICD-10-CM

## 2018-01-25 ENCOUNTER — Ambulatory Visit (HOSPITAL_COMMUNITY): Payer: BLUE CROSS/BLUE SHIELD

## 2018-01-25 ENCOUNTER — Ambulatory Visit (HOSPITAL_COMMUNITY)
Admission: RE | Admit: 2018-01-25 | Discharge: 2018-01-25 | Disposition: A | Discharge status: Home / Self Care | Payer: BLUE CROSS/BLUE SHIELD | Source: Ambulatory Visit | Attending: Cardiology | Admitting: Cardiology

## 2018-01-25 DIAGNOSIS — R079 Chest pain, unspecified: Secondary | ICD-10-CM | POA: Diagnosis present

## 2018-01-25 MED ORDER — NITROGLYCERIN 0.4 MG SL SUBL
0.8000 mg | SUBLINGUAL_TABLET | SUBLINGUAL | Status: DC | PRN
  Administered 2018-01-25: 0.8 mg via SUBLINGUAL
  Filled 2018-01-25 (×2): qty 25

## 2018-01-25 MED ORDER — METOPROLOL TARTRATE 5 MG/5ML IV SOLN
INTRAVENOUS | Status: AC
  Administered 2018-01-25: 5 mg via INTRAVENOUS
  Filled 2018-01-25: qty 15

## 2018-01-25 MED ORDER — IOPAMIDOL (ISOVUE-370) INJECTION 76%
INTRAVENOUS | Status: AC
  Administered 2018-01-25: 100 mL
  Filled 2018-01-25: qty 100

## 2018-01-25 MED ORDER — NITROGLYCERIN 0.4 MG SL SUBL
SUBLINGUAL_TABLET | SUBLINGUAL | Status: AC
  Administered 2018-01-25: 0.8 mg via SUBLINGUAL
  Filled 2018-01-25: qty 2

## 2018-01-25 MED ORDER — METOPROLOL TARTRATE 5 MG/5ML IV SOLN
INTRAVENOUS | Status: AC
  Administered 2018-01-25: 5 mg via INTRAVENOUS
  Filled 2018-01-25: qty 5

## 2018-01-25 MED ORDER — METOPROLOL TARTRATE 5 MG/5ML IV SOLN
5.0000 mg | INTRAVENOUS | Status: DC | PRN
  Administered 2018-01-25 (×4): 5 mg via INTRAVENOUS
  Filled 2018-01-25 (×4): qty 5

## 2018-01-31 ENCOUNTER — Telehealth: Payer: Self-pay | Admitting: *Deleted

## 2018-01-31 NOTE — Telephone Encounter (Signed)
Pt aware and voiced understanding - cx upcoming appt and placed 6 months recall - routed to pcp

## 2018-01-31 NOTE — Telephone Encounter (Signed)
-----   Message from Arnoldo Lenis, MD sent at 01/26/2018  3:56 PM EDT ----- CT looks, there is absolutely no evidence of any blockages in his heart, his arteries are completely clean. His chest pain is not heart related, he needs to f/u with his pcp to discuss other causes. Can f/u with [place] in 6 months (change our prior appointment please)   Zandra Abts MD

## 2018-02-05 ENCOUNTER — Ambulatory Visit: Payer: BLUE CROSS/BLUE SHIELD | Admitting: Cardiology

## 2018-02-12 ENCOUNTER — Ambulatory Visit (INDEPENDENT_AMBULATORY_CARE_PROVIDER_SITE_OTHER): Payer: BLUE CROSS/BLUE SHIELD

## 2018-02-12 ENCOUNTER — Ambulatory Visit (INDEPENDENT_AMBULATORY_CARE_PROVIDER_SITE_OTHER): Payer: BLUE CROSS/BLUE SHIELD | Admitting: Family Medicine

## 2018-02-12 ENCOUNTER — Encounter: Payer: Self-pay | Admitting: Family Medicine

## 2018-02-12 VITALS — BP 136/86 | HR 82 | Temp 98.4°F | Ht 70.0 in | Wt 178.0 lb

## 2018-02-12 DIAGNOSIS — M19011 Primary osteoarthritis, right shoulder: Secondary | ICD-10-CM

## 2018-02-12 DIAGNOSIS — Z720 Tobacco use: Secondary | ICD-10-CM

## 2018-02-12 DIAGNOSIS — Z0000 Encounter for general adult medical examination without abnormal findings: Secondary | ICD-10-CM | POA: Diagnosis not present

## 2018-02-12 DIAGNOSIS — M19012 Primary osteoarthritis, left shoulder: Secondary | ICD-10-CM

## 2018-02-12 MED ORDER — MELOXICAM 15 MG PO TABS: 15.0000 mg | ORAL_TABLET | Freq: Every day | ORAL | 3 refills | Status: DC

## 2018-02-12 MED ORDER — BUPROPION HCL ER (SR) 150 MG PO TB12: 150.0000 mg | ORAL_TABLET | Freq: Two times a day (BID) | ORAL | 3 refills | Status: DC

## 2018-02-12 NOTE — Progress Notes (Signed)
BP 136/86   Pulse 82   Temp 98.4 F (36.9 C) (Oral)   Ht '5\' 10"'  (1.778 m)   Wt 178 lb (80.7 kg)   BMI 25.54 kg/m    Subjective:    Patient ID: [name] Foster, male    DOB: December 11, 1955, 62 y.o.   MRN: [id number]  HPI: [name] Foster is a 62 y.o. male presenting on 02/12/2018 for Annual Exam (would like to have chest xray, having some discomfort in back rib area)   HPI Adult well exam and physical Patient is coming in for adult well exam and physical and labs today.  He does complain of bilateral shoulder arthritis and history of rotator cuff issues on both and some upper muscular back pain that he wanted to get a chest x-ray to make sure it is not anything going on in his lungs.  He denies any shortness of breath or cough or wheezing.  He does have an extensive smoking history in the past. Patient denies any chest pain, shortness of breath, headaches or vision issues, abdominal complaints, diarrhea, nausea, vomiting.   Relevant past medical, surgical, family and social history reviewed and updated as indicated. Interim medical history since our last visit reviewed. Allergies and medications reviewed and updated.  Review of Systems  Constitutional: Negative for chills and fever.  HENT: Negative for ear pain and tinnitus.   Eyes: Negative for pain.  Respiratory: Negative for cough, shortness of breath and wheezing.   Cardiovascular: Negative for chest pain, palpitations and leg swelling.  Gastrointestinal: Negative for abdominal pain, blood in stool, constipation and diarrhea.  Musculoskeletal: Positive for arthralgias. Negative for back pain and myalgias.  Skin: Negative for rash.  Neurological: Negative for dizziness, weakness and headaches.  Psychiatric/Behavioral: Negative for suicidal ideas.    Per HPI unless specifically indicated above   Allergies as of 02/12/2018   No Known Allergies     Medication List        Accurate as of 02/12/18  2:54 PM. Always use your most recent med  list.          aspirin EC 81 MG tablet Take 81 mg by mouth daily.   buPROPion 150 MG 12 hr tablet Commonly known as:  WELLBUTRIN SR Take 1 tablet (150 mg total) by mouth 2 (two) times daily.   Fish Oil 1000 MG Caps Take 1,000 mg by mouth daily.   hydrochlorothiazide 25 MG tablet Commonly known as:  HYDRODIURIL TAKE ONE TABLET BY MOUTH DAILY.   meloxicam 15 MG tablet Commonly known as:  MOBIC Take 1 tablet (15 mg total) by mouth daily.   metoprolol tartrate 25 MG tablet Commonly known as:  LOPRESSOR Take 1 tablet (25 mg total) by mouth 2 (two) times daily.   MULTIVITAMIN PO Take 1 tablet by mouth daily.   potassium chloride SA 20 MEQ tablet Commonly known as:  K-DUR,KLOR-CON TAKE ONE TABLET BY MOUTH DAILY.          Objective:    BP 136/86   Pulse 82   Temp 98.4 F (36.9 C) (Oral)   Ht '5\' 10"'  (1.778 m)   Wt 178 lb (80.7 kg)   BMI 25.54 kg/m   Wt Readings from Last 3 Encounters:  02/12/18 178 lb (80.7 kg)  12/08/17 170 lb (77.1 kg)  11/15/17 170 lb (77.1 kg)    Physical Exam  Constitutional: He is oriented to person, place, and time. He appears well-developed and well-nourished. No distress.  HENT:  Right Ear:  External ear normal.  Left Ear: External ear normal.  Nose: Nose normal.  Mouth/Throat: Oropharynx is clear and moist. No oropharyngeal exudate.  Eyes: Pupils are equal, round, and reactive to light. Conjunctivae and EOM are normal. Right eye exhibits no discharge. No scleral icterus.  Neck: Neck supple. No thyromegaly present.  Cardiovascular: Normal rate, regular rhythm, normal heart sounds and intact distal pulses.  No murmur heard. Pulmonary/Chest: Effort normal and breath sounds normal. No respiratory distress. He has no wheezes.  Abdominal: Soft. Bowel sounds are normal. He exhibits no distension. There is no tenderness. There is no rebound and no guarding.  Musculoskeletal: Normal range of motion. He exhibits tenderness (Tenderness over  musculature on lateral neck on right side down into shoulders and a little bit on the other side as well.  Limited range of motion of both shoulders and overhead movement.). He exhibits no edema.  Lymphadenopathy:    He has no cervical adenopathy.  Neurological: He is alert and oriented to person, place, and time. Coordination normal.  Skin: Skin is warm and dry. No rash noted. He is not diaphoretic.  Psychiatric: He has a normal mood and affect. His behavior is normal.  Nursing note and vitals reviewed.   Chest x-ray: No acute cardiopulmonary abnormalities no acute cardiopulmonary abnormalities     Assessment & Plan:   Problem List Items Addressed This Visit      Musculoskeletal and Integument   Bilateral shoulder region arthritis   Relevant Medications   meloxicam (MOBIC) 15 MG tablet     Other   Tobacco abuse   Relevant Medications   buPROPion (WELLBUTRIN SR) 150 MG 12 hr tablet   Other Relevant Orders   DG Chest 2 View (Completed)    Other Visit Diagnoses    Well adult exam    -  Primary   Relevant Orders   CBC with Differential/Platelet   CMP14+EGFR   Lipid panel       Follow up plan: Return in about 1 year (around 02/13/2019), or if symptoms worsen or fail to improve.  Counseling provided for all of the vaccine components Orders Placed This Encounter  Procedures  . DG Chest 2 View  . CBC with Differential/Platelet  . CMP14+EGFR  . Lipid panel    Caryl Pina, MD [facility] 02/12/2018, 2:54 PM

## 2018-02-13 LAB — CBC WITH DIFFERENTIAL/PLATELET
BASOS ABS: 0 10*3/uL (ref 0.0–0.2)
Basos: 0 %
EOS (ABSOLUTE): 0.1 10*3/uL (ref 0.0–0.4)
Eos: 2 %
HEMATOCRIT: 43.5 % (ref 37.5–51.0)
HEMOGLOBIN: 14.6 g/dL (ref 13.0–17.7)
Immature Grans (Abs): 0 10*3/uL (ref 0.0–0.1)
Immature Granulocytes: 0 %
LYMPHS ABS: 2.2 10*3/uL (ref 0.7–3.1)
Lymphs: 30 %
MCH: 26.3 pg — AB (ref 26.6–33.0)
MCHC: 33.6 g/dL (ref 31.5–35.7)
MCV: 78 fL — ABNORMAL LOW (ref 79–97)
MONOCYTES: 7 %
MONOS ABS: 0.5 10*3/uL (ref 0.1–0.9)
NEUTROS ABS: 4.5 10*3/uL (ref 1.4–7.0)
Neutrophils: 61 %
Platelets: 227 10*3/uL (ref 150–379)
RBC: 5.55 x10E6/uL (ref 4.14–5.80)
RDW: 15.1 % (ref 12.3–15.4)
WBC: 7.4 10*3/uL (ref 3.4–10.8)

## 2018-02-13 LAB — CMP14+EGFR
ALBUMIN: 4.6 g/dL (ref 3.5–5.5)
ALK PHOS: 100 IU/L (ref 39–117)
ALT: 34 IU/L (ref 0–44)
AST: 17 IU/L (ref 0–40)
Albumin/Globulin Ratio: 2.1 (ref 1.2–2.2)
BILIRUBIN TOTAL: 0.2 mg/dL (ref 0.0–1.2)
BUN / CREAT RATIO: 16 (ref 9–20)
BUN: 15 mg/dL (ref 6–24)
CHLORIDE: 106 mmol/L (ref 96–106)
CO2: 21 mmol/L (ref 20–29)
Calcium: 9.2 mg/dL (ref 8.7–10.2)
Creatinine, Ser: 0.94 mg/dL (ref 0.76–1.27)
GFR calc Af Amer: 102 mL/min/{1.73_m2} (ref >59)
GFR calc non Af Amer: 88 mL/min/{1.73_m2} (ref >59)
GLOBULIN, TOTAL: 2.2 g/dL (ref 1.5–4.5)
GLUCOSE: 109 mg/dL — AB (ref 65–99)
Potassium: 3.7 mmol/L (ref 3.5–5.2)
SODIUM: 143 mmol/L (ref 134–144)
Total Protein: 6.8 g/dL (ref 6.0–8.5)

## 2018-02-13 LAB — LIPID PANEL
CHOLESTEROL TOTAL: 143 mg/dL (ref 100–199)
Chol/HDL Ratio: 4.3 ratio (ref 0.0–5.0)
HDL: 33 mg/dL — ABNORMAL LOW (ref >39)
LDL Calculated: 84 mg/dL (ref 0–99)
Triglycerides: 128 mg/dL (ref 0–149)
VLDL Cholesterol Cal: 26 mg/dL (ref 5–40)

## 2018-02-16 LAB — SPECIMEN STATUS REPORT

## 2018-02-16 LAB — HGB A1C W/O EAG: HEMOGLOBIN A1C: 5.6 % (ref 4.8–5.6)

## 2018-03-08 ENCOUNTER — Other Ambulatory Visit: Payer: Self-pay | Admitting: Family Medicine

## 2018-03-08 DIAGNOSIS — I10 Essential (primary) hypertension: Secondary | ICD-10-CM

## 2018-04-02 ENCOUNTER — Other Ambulatory Visit: Payer: Self-pay | Admitting: Family Medicine

## 2018-04-02 DIAGNOSIS — I10 Essential (primary) hypertension: Secondary | ICD-10-CM

## 2018-05-23 ENCOUNTER — Other Ambulatory Visit: Payer: Self-pay | Admitting: Family Medicine

## 2018-05-24 NOTE — Telephone Encounter (Signed)
Last seen 4.15.19  Dr Warrick Parisian

## 2018-07-10 ENCOUNTER — Other Ambulatory Visit: Payer: Self-pay | Admitting: Family Medicine

## 2018-07-10 DIAGNOSIS — I10 Essential (primary) hypertension: Secondary | ICD-10-CM

## 2018-09-03 ENCOUNTER — Other Ambulatory Visit: Payer: Self-pay | Admitting: *Deleted

## 2018-09-03 MED ORDER — SILDENAFIL CITRATE 20 MG PO TABS: ORAL_TABLET | ORAL | 0 refills | Status: DC

## 2018-10-23 ENCOUNTER — Other Ambulatory Visit: Payer: Self-pay | Admitting: Family Medicine

## 2018-10-23 DIAGNOSIS — I10 Essential (primary) hypertension: Secondary | ICD-10-CM

## 2018-10-25 NOTE — Telephone Encounter (Signed)
Last seen 02/12/18

## 2018-12-06 IMAGING — DX DG CHEST 2V
2 series · 2 of 2 positions shown · non-contrast
Comparison: 08/29/2012

CLINICAL DATA: Posterior chest pain and long smoking history.

EXAM:
CHEST - 2 VIEW

[chest pa]
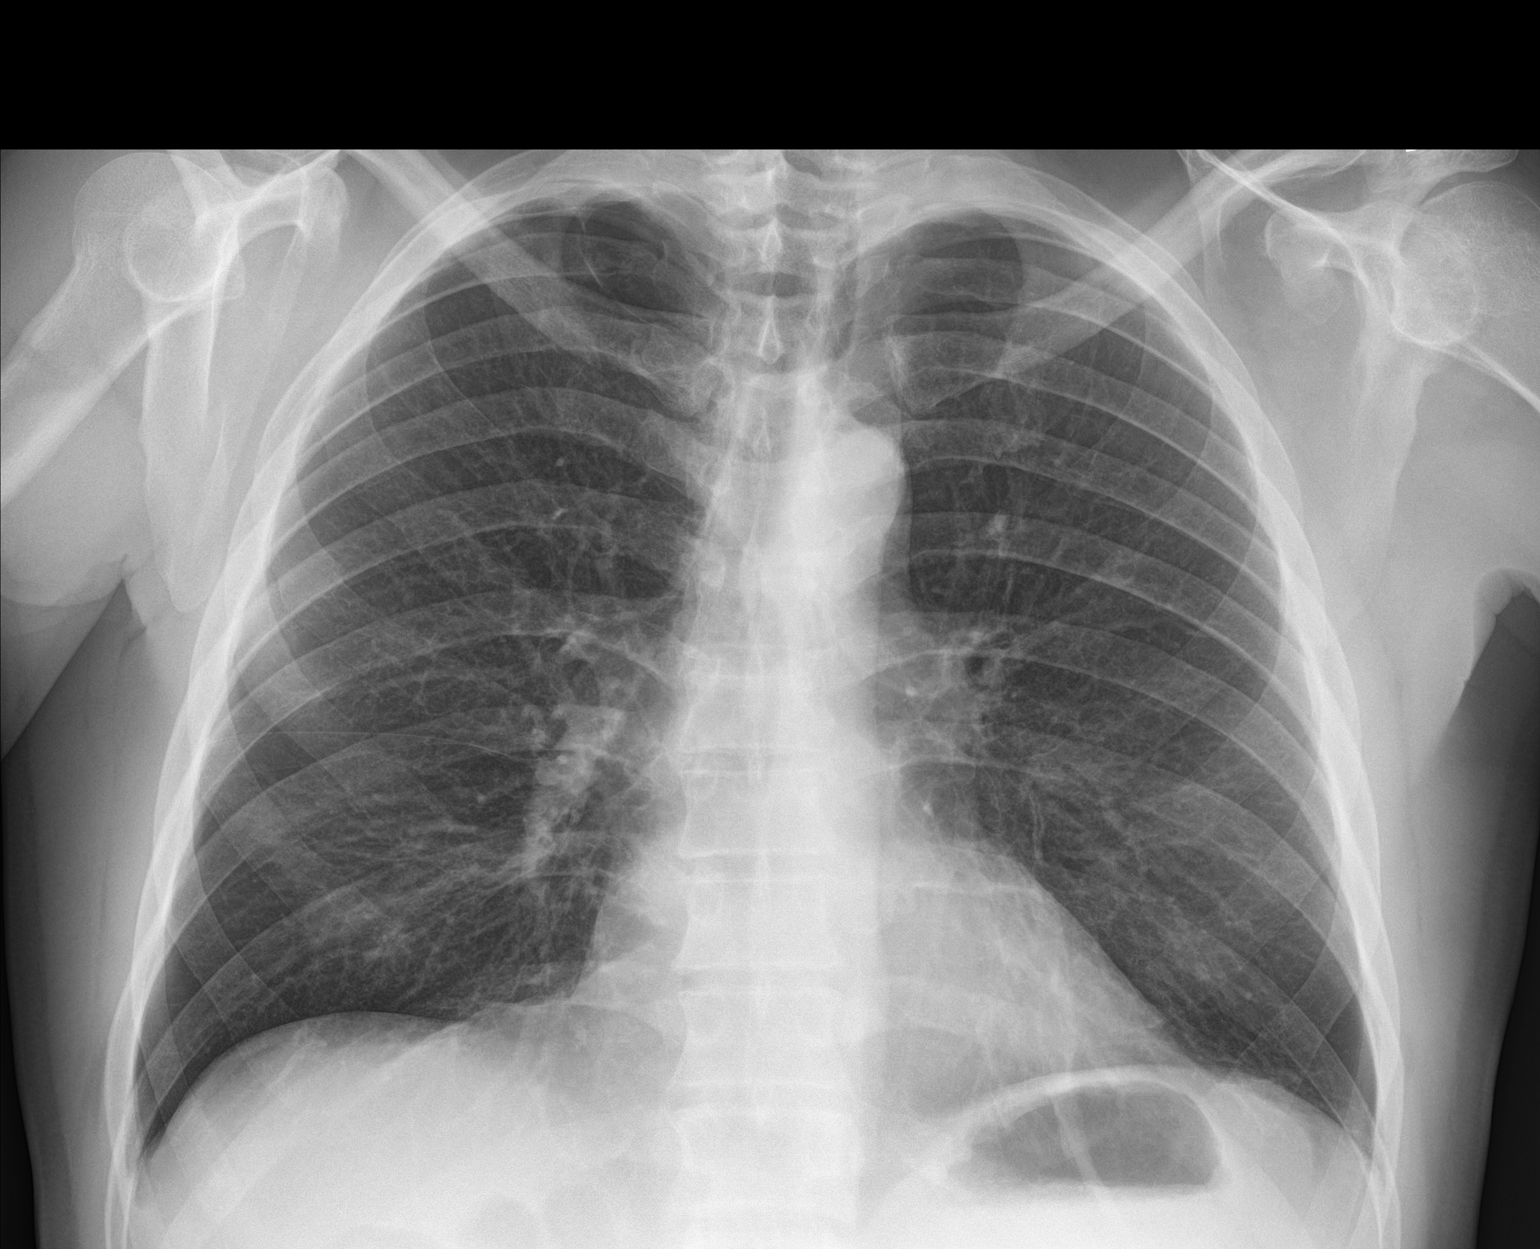

[chest lat]
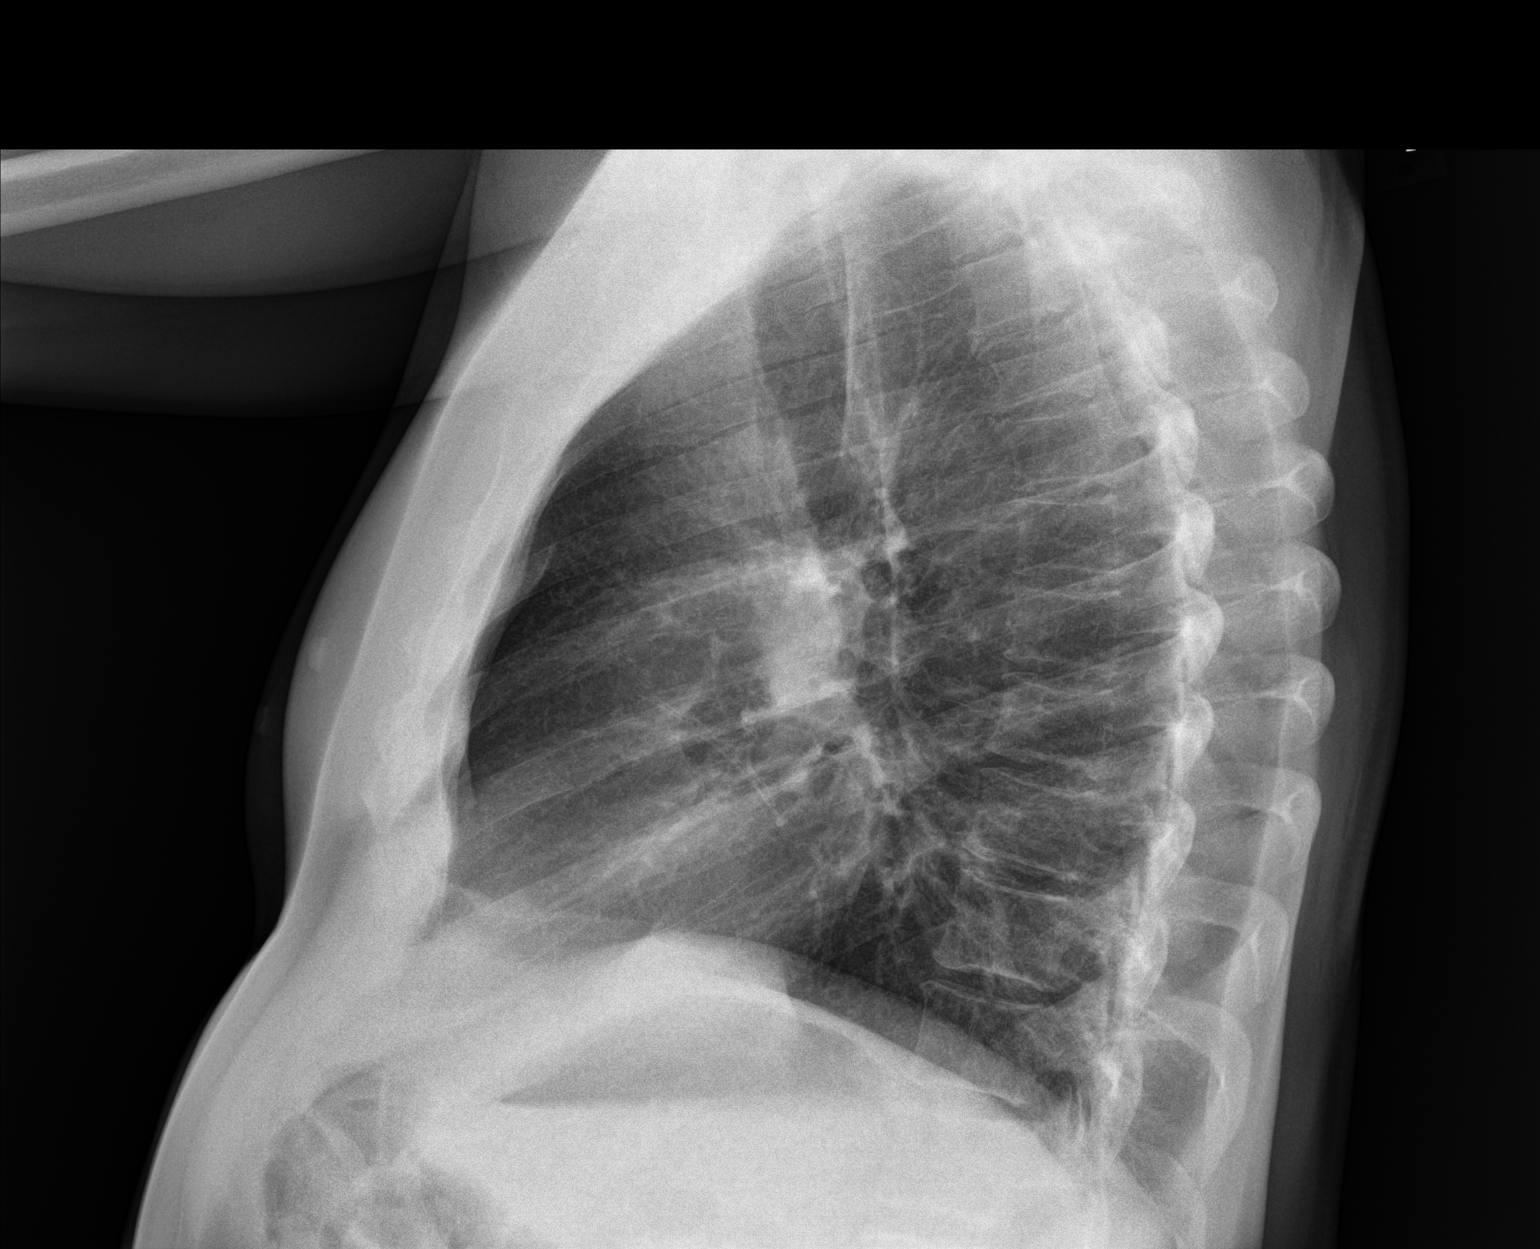

[2 of 2 positions shown; findings below may reference images not displayed]

FINDINGS: Heart size is normal. Mediastinal shadows are normal. The lungs are
clear. The vascularity is normal. No effusions. No significant bone
finding.
IMPRESSION: No active cardiopulmonary disease.

## 2018-12-17 ENCOUNTER — Telehealth: Payer: Self-pay | Admitting: Cardiology

## 2018-12-17 NOTE — Telephone Encounter (Signed)
Numerous attempts to contact patient with recall letters. Unable to reach by telephone. with no success.    [[id number]] 01/31/2018 4:16 PM New [10]    [System] 04/30/2018 11:02 PM Notification Sent [20]    06/19/2018 4:36 PM Notification Sent [20]    12/12/2018 4:28 PM Notification Sent [64

## 2019-01-08 ENCOUNTER — Other Ambulatory Visit: Payer: Self-pay | Admitting: Family Medicine

## 2019-01-08 DIAGNOSIS — I10 Essential (primary) hypertension: Secondary | ICD-10-CM

## 2019-01-09 NOTE — Telephone Encounter (Signed)
Aware, needs an appointment. 

## 2019-01-09 NOTE — Telephone Encounter (Signed)
Last seen 4/19  Dr Dettinger  Needs to be seen

## 2019-01-28 ENCOUNTER — Other Ambulatory Visit: Payer: Self-pay

## 2019-01-28 NOTE — Telephone Encounter (Signed)
Last office visit 02/12/2018

## 2019-01-29 MED ORDER — SILDENAFIL CITRATE 20 MG PO TABS: ORAL_TABLET | ORAL | 0 refills | Status: DC

## 2019-02-20 ENCOUNTER — Other Ambulatory Visit: Payer: Self-pay

## 2019-02-20 ENCOUNTER — Encounter: Payer: Self-pay | Admitting: Family Medicine

## 2019-02-20 ENCOUNTER — Ambulatory Visit (INDEPENDENT_AMBULATORY_CARE_PROVIDER_SITE_OTHER): Payer: BLUE CROSS/BLUE SHIELD | Admitting: Family Medicine

## 2019-02-20 DIAGNOSIS — Z13220 Encounter for screening for lipoid disorders: Secondary | ICD-10-CM | POA: Diagnosis not present

## 2019-02-20 DIAGNOSIS — I10 Essential (primary) hypertension: Secondary | ICD-10-CM | POA: Diagnosis not present

## 2019-02-20 DIAGNOSIS — Z720 Tobacco use: Secondary | ICD-10-CM | POA: Diagnosis not present

## 2019-02-20 MED ORDER — HYDROCHLOROTHIAZIDE 25 MG PO TABS: 25.0000 mg | ORAL_TABLET | Freq: Every day | ORAL | 3 refills | Status: DC

## 2019-02-20 MED ORDER — POTASSIUM CHLORIDE CRYS ER 20 MEQ PO TBCR: 20.0000 meq | EXTENDED_RELEASE_TABLET | Freq: Every day | ORAL | 1 refills | Status: DC

## 2019-02-20 MED ORDER — BUPROPION HCL ER (SR) 150 MG PO TB12: 150.0000 mg | ORAL_TABLET | Freq: Two times a day (BID) | ORAL | 3 refills | Status: DC

## 2019-02-20 MED ORDER — METOPROLOL TARTRATE 25 MG PO TABS: 25.0000 mg | ORAL_TABLET | Freq: Two times a day (BID) | ORAL | 3 refills | Status: DC

## 2019-02-20 NOTE — Progress Notes (Signed)
Virtual Visit via telephone Note  I connected with [name] Foster. on 02/20/19 at 1255 by telephone and verified that I am speaking with the correct person using two identifiers. [name] Foster. is currently located at home and no other people are currently with her during visit. The provider, Fransisca Kaufmann Matayah Reyburn, MD is located in their office at time of visit.  Call ended at 1305  I discussed the limitations, risks, security and privacy concerns of performing an evaluation and management service by telephone and the availability of in person appointments. I also discussed with the patient that there may be a patient responsible charge related to this service. The patient expressed understanding and agreed to proceed.   History and Present Illness: Hypertension Patient is currently on hctz and metoprolol, and their blood pressure today is 113/75. Patient denies any lightheadedness or dizziness. Patient denies headaches, blurred vision, chest pains, shortness of breath, or weakness. Denies any side effects from medication and is content with current medication.   Tobacco abuse Takes Wellbutrin and it is helping but he is still smoking although less than what it was before.  No diagnosis found.  Outpatient Encounter Medications as of 02/20/2019  Medication Sig  . aspirin EC 81 MG tablet Take 81 mg by mouth daily.  Marland Kitchen buPROPion (WELLBUTRIN SR) 150 MG 12 hr tablet Take 1 tablet (150 mg total) by mouth 2 (two) times daily.  . hydrochlorothiazide (HYDRODIURIL) 25 MG tablet TAKE ONE TABLET BY MOUTH DAILY  . meloxicam (MOBIC) 15 MG tablet Take 1 tablet (15 mg total) by mouth daily.  . metoprolol tartrate (LOPRESSOR) 25 MG tablet TAKE ONE TABLET BY MOUTH TWICE DAILY.  . Multiple Vitamins-Minerals (MULTIVITAMIN PO) Take 1 tablet by mouth daily.  . Omega-3 Fatty Acids (FISH OIL) 1000 MG CAPS Take 1,000 mg by mouth daily.  . potassium chloride SA (K-DUR,KLOR-CON) 20 MEQ tablet TAKE ONE TABLET BY MOUTH  DAILY.  . sildenafil (REVATIO) 20 MG tablet TAKE 1 TO 2 TABLETS BY MOUTH AS NEEDED   No facility-administered encounter medications on file as of 02/20/2019.     Review of Systems  Constitutional: Negative for chills and fever.  Respiratory: Negative for shortness of breath and wheezing.   Cardiovascular: Negative for chest pain and leg swelling.  Musculoskeletal: Negative for back pain and gait problem.  Skin: Negative for rash.  Neurological: Negative for dizziness, weakness, light-headedness and numbness.  All other systems reviewed and are negative.   Observations/Objective: Patient sounds comfortable and in no acute distress  Assessment and Plan: Problem List Items Addressed This Visit      Cardiovascular and Mediastinum   Essential hypertension - Primary   Relevant Medications   hydrochlorothiazide (HYDRODIURIL) 25 MG tablet   metoprolol tartrate (LOPRESSOR) 25 MG tablet   potassium chloride SA (K-DUR) 20 MEQ tablet   Other Relevant Orders   CBC with Differential/Platelet   CMP14+EGFR     Other   Tobacco abuse   Relevant Medications   buPROPion (WELLBUTRIN SR) 150 MG 12 hr tablet   Other Relevant Orders   CBC with Differential/Platelet    Other Visit Diagnoses    Lipid screening       Relevant Orders   CBC with Differential/Platelet   Lipid panel       Follow Up Instructions:  Continue Wellbutrin and metoprolol and hydrochlorothiazide and potassium, will have him come in and do blood work sometime in the future after coronavirus is over. Follow-up in 1 year  I discussed the assessment and treatment plan with the patient. The patient was provided an opportunity to ask questions and all were answered. The patient agreed with the plan and demonstrated an understanding of the instructions.   The patient was advised to call back or seek an in-person evaluation if the symptoms worsen or if the condition fails to improve as anticipated.  The above assessment and  management plan was discussed with the patient. The patient verbalized understanding of and has agreed to the management plan. Patient is aware to call the clinic if symptoms persist or worsen. Patient is aware when to return to the clinic for a follow-up visit. Patient educated on when it is appropriate to go to the emergency department.    I provided 10 minutes of non-face-to-face time during this encounter.    Worthy Rancher, MD

## 2019-05-10 ENCOUNTER — Encounter: Payer: Self-pay | Admitting: Family Medicine

## 2019-05-10 ENCOUNTER — Ambulatory Visit (INDEPENDENT_AMBULATORY_CARE_PROVIDER_SITE_OTHER): Payer: BC Managed Care – PPO | Admitting: Family Medicine

## 2019-05-10 ENCOUNTER — Telehealth: Payer: Self-pay | Admitting: Family Medicine

## 2019-05-10 DIAGNOSIS — K047 Periapical abscess without sinus: Secondary | ICD-10-CM

## 2019-05-10 MED ORDER — AMOXICILLIN 500 MG PO CAPS: ORAL_CAPSULE | ORAL | 0 refills | Status: DC

## 2019-05-10 NOTE — Patient Instructions (Signed)
Dental Abscess  A dental abscess is an area of pus in or around a tooth. It comes from an infection. It can cause pain and other symptoms. Treatment will help with symptoms and prevent the infection from spreading. Follow these instructions at home: Medicines  Take over-the-counter and prescription medicines only as told by your dentist.  If you were prescribed an antibiotic medicine, take it as told by your dentist. Do not stop taking it even if you start to feel better.  If you were prescribed a gel that has numbing medicine in it, use it exactly as told.  Do not drive or use heavy machinery (like a Conservation officer, nature) while taking prescription pain medicine. General instructions  Rinse out your mouth often with salt water. ? To make salt water, dissolve -1 tsp of salt in 1 cup of warm water.  Eat a soft diet while your mouth is healing.  Drink enough fluid to keep your urine pale yellow.  Do not apply heat to the outside of your mouth.  Do not use any products that contain nicotine or tobacco. These include cigarettes and e-cigarettes. If you need help quitting, ask your doctor.  Keep all follow-up visits as told by your dentist. This is important. Prevent an abscess  Brush your teeth every morning and every night. Use fluoride toothpaste.  Floss your teeth each day.  Get dental cleanings as often as told by your dentist.  Think about getting dental sealant put on teeth that have deep holes (decay).  Drink water that has fluoride in it. ? Most tap water has fluoride. ? Check the label on bottled water to see if it has fluoride in it.  Drink water instead of sugary drinks.  Eat healthy meals and snacks.  Wear a mouth guard or face shield when you play sports. Contact a doctor if:  Your pain is worse, and medicine does not help. Get help right away if:  You have a fever or chills.  Your symptoms suddenly get worse.  You have a very bad headache.  You have problems  breathing or swallowing.  You have trouble opening your mouth.  You have swelling in your neck or close to your eye. Summary  A dental abscess is an area of pus in or around a tooth. It is caused by an infection.  Treatment will help with symptoms and prevent the infection from spreading.  Take over-the-counter and prescription medicines only as told by your dentist.  To prevent an abscess, take good care of your teeth. Brush your teeth every morning and night. Use floss every day.  Get dental cleanings as often as told by your dentist. This information is not intended to replace advice given to you by your health care provider. Make sure you discuss any questions you have with your health care provider. Document Released: 03/03/2015 Document Revised: 02/06/2019 Document Reviewed: 06/19/2017 Elsevier Patient Education  2020 [employer].

## 2019-05-10 NOTE — Progress Notes (Signed)
Virtual Visit via Telephone Note  I connected with [name] Foster. on 05/10/19 at 11:18 AM by telephone and verified that I am speaking with the correct person using two identifiers. [name] Foster. is currently located at home and nobody is currently with him during this visit. The provider, Loman Brooklyn, FNP is located in their office at time of visit.  I discussed the limitations, risks, security and privacy concerns of performing an evaluation and management service by telephone and the availability of in person appointments. I also discussed with the patient that there may be a patient responsible charge related to this service. The patient expressed understanding and agreed to proceed.  Subjective: PCP: Dettinger, Fransisca Kaufmann, MD  Chief Complaint  Patient presents with  . Dental Pain   Patient reports he is experiencing swelling of his gum around a painful molar tooth on the bottom right. States his whole right side of his face is swelling. Symptoms started 2 days ago. He called his dentist but is unable to get in right now due to COVID-19. He has been taking Ibuprofen 800 mg and swishing around warm salt water which is somewhat effective in decreasing swelling and easing the pain. Eating makes the pain worse.   ROS: Per HPI  No Known Allergies Past Medical History:  Diagnosis Date  . Abnormal EKG   . Apical variant hypertrophic cardiomyopathy ([place])   . Benign essential hypertension   . BPH (benign prostatic hyperplasia)   . Hypokalemia   . Tobacco abuse     Current Outpatient Medications:  .  aspirin EC 81 MG tablet, Take 81 mg by mouth daily., Disp: , Rfl:  .  buPROPion (WELLBUTRIN SR) 150 MG 12 hr tablet, Take 1 tablet (150 mg total) by mouth 2 (two) times daily., Disp: 180 tablet, Rfl: 3 .  hydrochlorothiazide (HYDRODIURIL) 25 MG tablet, Take 1 tablet (25 mg total) by mouth daily., Disp: 90 tablet, Rfl: 3 .  meloxicam (MOBIC) 15 MG tablet, Take 1 tablet (15 mg total) by  mouth daily., Disp: 90 tablet, Rfl: 3 .  metoprolol tartrate (LOPRESSOR) 25 MG tablet, Take 1 tablet (25 mg total) by mouth 2 (two) times daily., Disp: 180 tablet, Rfl: 3 .  Multiple Vitamins-Minerals (MULTIVITAMIN PO), Take 1 tablet by mouth daily., Disp: , Rfl:  .  Omega-3 Fatty Acids (FISH OIL) 1000 MG CAPS, Take 1,000 mg by mouth daily., Disp: , Rfl:  .  potassium chloride SA (K-DUR) 20 MEQ tablet, Take 1 tablet (20 mEq total) by mouth daily., Disp: 90 tablet, Rfl: 1 .  sildenafil (REVATIO) 20 MG tablet, TAKE 1 TO 2 TABLETS BY MOUTH AS NEEDED, Disp: 30 tablet, Rfl: 0   Observations/Objective: A&O  No respiratory distress or wheezing audible over the phone Mood, judgement, and thought processes all WNL  Assessment and Plan: 1. Tooth abscess - Education provided on tooth abscesses. He was encouraged to continue routine Ibuprofen and salt water swishes, as well as get in to see his dentist when he is able.  - amoxicillin (AMOXIL) 500 MG capsule; Take 1,000 mg (2 capsules) by mouth today, then tomorrow start 500 mg (1 capsule) by mouth every 8 hours x3 days.  Dispense: 11 capsule; Refill: 0   Follow Up Instructions:  I discussed the assessment and treatment plan with the patient. The patient was provided an opportunity to ask questions and all were answered. The patient agreed with the plan and demonstrated an understanding of the instructions.  The patient was advised to call back or seek an in-person evaluation if the symptoms worsen or if the condition fails to improve as anticipated.  The above assessment and management plan was discussed with the patient. The patient verbalized understanding of and has agreed to the management plan. Patient is aware to call the clinic if symptoms persist or worsen. Patient is aware when to return to the clinic for a follow-up visit. Patient educated on when it is appropriate to go to the emergency department.   Time call ended: 11:24 AM  I provided  6 minutes of non-face-to-face time during this encounter.  Hendricks Limes, MSN, APRN, FNP-C [facility] 05/10/19

## 2019-08-07 ENCOUNTER — Other Ambulatory Visit: Payer: Self-pay | Admitting: Family Medicine

## 2019-08-07 DIAGNOSIS — I10 Essential (primary) hypertension: Secondary | ICD-10-CM

## 2019-08-07 NOTE — Telephone Encounter (Signed)
Last visit 01/2019 last labs 01/2018

## 2019-08-24 ENCOUNTER — Other Ambulatory Visit: Payer: Self-pay

## 2019-08-24 ENCOUNTER — Ambulatory Visit (HOSPITAL_COMMUNITY)
Admission: EM | Admit: 2019-08-24 | Discharge: 2019-08-24 | Disposition: A | Discharge status: Home / Self Care | Payer: Medicare Other | Attending: Family Medicine | Admitting: Family Medicine

## 2019-08-24 ENCOUNTER — Encounter (HOSPITAL_COMMUNITY): Payer: Self-pay

## 2019-08-24 DIAGNOSIS — J0110 Acute frontal sinusitis, unspecified: Secondary | ICD-10-CM | POA: Diagnosis present

## 2019-08-24 DIAGNOSIS — R519 Headache, unspecified: Secondary | ICD-10-CM | POA: Insufficient documentation

## 2019-08-24 DIAGNOSIS — F17210 Nicotine dependence, cigarettes, uncomplicated: Secondary | ICD-10-CM | POA: Insufficient documentation

## 2019-08-24 DIAGNOSIS — Z833 Family history of diabetes mellitus: Secondary | ICD-10-CM | POA: Insufficient documentation

## 2019-08-24 DIAGNOSIS — Z20828 Contact with and (suspected) exposure to other viral communicable diseases: Secondary | ICD-10-CM | POA: Insufficient documentation

## 2019-08-24 MED ORDER — AMOXICILLIN-POT CLAVULANATE 875-125 MG PO TABS: 1.0000 | ORAL_TABLET | Freq: Two times a day (BID) | ORAL | 0 refills | Status: DC

## 2019-08-24 MED ORDER — TRAMADOL HCL 50 MG PO TABS: 50.0000 mg | ORAL_TABLET | Freq: Four times a day (QID) | ORAL | 0 refills | Status: DC | PRN

## 2019-08-24 NOTE — ED Triage Notes (Signed)
Pt present sinus pressure and headache. Symptoms started 1 week ago. Pt states he tried OTC medication with no relief.

## 2019-08-24 NOTE — Discharge Instructions (Addendum)
Be aware, pain medications may cause drowsiness. Please do not drive, operate heavy machinery or make important decisions while on this medication, it can cloud your judgement.  

## 2019-08-26 LAB — NOVEL CORONAVIRUS, NAA (HOSP ORDER, SEND-OUT TO REF LAB; TAT 18-24 HRS): SARS-CoV-2, NAA: NOT DETECTED

## 2019-08-26 NOTE — ED Provider Notes (Signed)
[name] Foster   [id number] 08/24/19 Arrival Time: [id number]  ASSESSMENT & PLAN:  1. Acute non-recurrent frontal sinusitis   2. Acute nonintractable headache, unspecified headache type     Meds ordered this encounter  Medications  . amoxicillin-clavulanate (AUGMENTIN) 875-125 MG tablet    Sig: Take 1 tablet by mouth every 12 (twelve) hours.    Dispense:  20 tablet    Refill:  0  . traMADol (ULTRAM) 50 MG tablet    Sig: Take 1 tablet (50 mg total) by mouth every 6 (six) hours as needed.    Dispense:  12 tablet    Refill:  0    Discussed typical duration of symptoms. OTC symptom care as needed. Ensure adequate fluid intake and rest.  Follow-up Information    [name] Foster, [name] Kaufmann, MD.   Specialties: Family Medicine, Cardiology Why: If worsening or failing to improve as anticipated. Contact information: [place] [address] [phone]           Reviewed expectations re: course of current medical issues. Questions answered. Outlined signs and symptoms indicating need for more acute intervention. Patient verbalized understanding. After Visit Summary given.   SUBJECTIVE: History from: patient.  [name]. is a 63 y.o. male who presents with complaint of nasal congestion, post-nasal drainage, and frontal sinus pain. Onset gradual, 1 - 1.5 w ago. Respiratory symptoms: none. Fever: absent. Overall normal PO intake without n/v. OTC treatment: various "cold medicines" without much relief. Seasonal allergies: "maybe a little". History of frequent sinus infections: no. No specific aggravating or alleviating factors reported. Associated headache has become more constant but current symptoms have not limited daily activities. No extremity sensation changes or weakness. No difficulties with ambulation. Normal bowel/bladder habits. No recent air travel.  Social History   Tobacco Use  Smoking Status Current Every Day Smoker  . Packs/day: 1.00  . Years:  28.00  . Pack years: 28.00  . Types: Cigarettes  Smokeless Tobacco Never Used  Tobacco Comment   pack will last 3 days     ROS: As per HPI. All other systems negative.   OBJECTIVE:  Vitals:   08/24/19 1552  BP: (!) 129/98  Pulse: 87  Resp: 16  Temp: 99 F (37.2 C)  TempSrc: Oral  SpO2: 100%     General appearance: alert; no distress HEENT: nasal congestion; clear runny nose; throat irritation secondary to post-nasal drainage; bilateral frontal tenderness to palpation; turbinates slightly boggy Neck: supple without LAD; trachea midline CV: regular Lungs: unlabored respirations, symmetrical air entry; cough: absent; no respiratory distress Abd: soft; non-tender Skin: warm and dry Neuro: normal gait; CN 2-12 grossly intact; moving all extremities normally Psychological: alert and cooperative; normal mood and affect  No Known Allergies  Past Medical History:  Diagnosis Date  . Abnormal EKG   . Apical variant hypertrophic cardiomyopathy ([place])   . Benign essential hypertension   . BPH (benign prostatic hyperplasia)   . Hypokalemia   . Tobacco abuse    Family History  Problem Relation Age of Onset  . Diabetes Mother   . Diabetes Other   . Hypertension Other    Social History   Socioeconomic History  . Marital status: Legally Separated    Spouse name: Not on file  . Number of children: 1  . Years of education: Not on file  . Highest education level: Not on file  Occupational History  . Occupation: Full time Personal assistant: [employer]  Social Needs  . Financial resource strain: Not on file  . Food insecurity    Worry: Not on file    Inability: Not on file  . Transportation needs    Medical: Not on file    Non-medical: Not on file  Tobacco Use  . Smoking status: Current Every Day Smoker    Packs/day: 1.00    Years: 28.00    Pack years: 28.00    Types: Cigarettes  . Smokeless tobacco: Never Used  . Tobacco comment: pack will last 3 days    Substance and Sexual Activity  . Alcohol use: No  . Drug use: No  . Sexual activity: Not on file  Lifestyle  . Physical activity    Days per week: Not on file    Minutes per session: Not on file  . Stress: Not on file  Relationships  . Social Herbalist on phone: Not on file    Gets together: Not on file    Attends religious service: Not on file    Active member of club or organization: Not on file    Attends meetings of clubs or organizations: Not on file    Relationship status: Not on file  . Intimate partner violence    Fear of current or ex partner: Not on file    Emotionally abused: Not on file    Physically abused: Not on file    Forced sexual activity: Not on file  Other Topics Concern  . Not on file  Social History Narrative   Divorced            [name] Kick, MD 08/26/19 [phone]

## 2019-10-14 ENCOUNTER — Telehealth: Payer: Self-pay | Admitting: Family Medicine

## 2019-10-14 MED ORDER — SILDENAFIL CITRATE 100 MG PO TABS: 100.0000 mg | ORAL_TABLET | Freq: Every day | ORAL | 1 refills | Status: DC | PRN

## 2019-10-14 NOTE — Telephone Encounter (Signed)
Patient calling to get generic Viagra 100mg . Patient states he cuts them and is cheaper for him that way. Patient uses [facility]

## 2019-10-14 NOTE — Telephone Encounter (Signed)
Lmtcb.

## 2019-10-14 NOTE — Telephone Encounter (Signed)
Patient requested to speak with nurse regarding one of his recent appts.

## 2019-10-14 NOTE — Telephone Encounter (Signed)
I sent in across as 100 mg but I do not know that they have a generic form of that, let me know if they do.

## 2019-10-14 NOTE — Telephone Encounter (Signed)
Patient aware and verbalized understanding. °

## 2019-12-05 ENCOUNTER — Other Ambulatory Visit: Payer: Self-pay

## 2019-12-05 ENCOUNTER — Ambulatory Visit (INDEPENDENT_AMBULATORY_CARE_PROVIDER_SITE_OTHER): Payer: BC Managed Care – PPO | Admitting: Family Medicine

## 2019-12-05 ENCOUNTER — Encounter: Payer: Self-pay | Admitting: Family Medicine

## 2019-12-05 DIAGNOSIS — R399 Unspecified symptoms and signs involving the genitourinary system: Secondary | ICD-10-CM

## 2019-12-05 MED ORDER — SULFAMETHOXAZOLE-TRIMETHOPRIM 800-160 MG PO TABS: 1.0000 | ORAL_TABLET | Freq: Two times a day (BID) | ORAL | 0 refills | Status: DC

## 2019-12-05 NOTE — Progress Notes (Signed)
Virtual Visit via telephone Note  I connected with [name] Foster. on 12/05/19 at 1705 by telephone and verified that I am speaking with the correct person using two identifiers. [name] Foster. is currently located at home and no other people are currently with her during visit. The provider, Fransisca Kaufmann , MD is located in their office at time of visit.  Call ended at 1711  I discussed the limitations, risks, security and privacy concerns of performing an evaluation and management service by telephone and the availability of in person appointments. I also discussed with the patient that there may be a patient responsible charge related to this service. The patient expressed understanding and agreed to proceed.   History and Present Illness: Patient has been having flank pain that has been bothering him about 2 days.  He tried cranberry juice and it has not been helping.  He denies dysuria and is having frequency but no hematuria.  He denies abdominal pain.  He denies any fevers or chills.  No diagnosis found.  Outpatient Encounter Medications as of 12/05/2019  Medication Sig  . amoxicillin-clavulanate (AUGMENTIN) 875-125 MG tablet Take 1 tablet by mouth every 12 (twelve) hours.  Marland Kitchen aspirin EC 81 MG tablet Take 81 mg by mouth daily.  Marland Kitchen buPROPion (WELLBUTRIN SR) 150 MG 12 hr tablet Take 1 tablet (150 mg total) by mouth 2 (two) times daily.  Marland Kitchen gabapentin (NEURONTIN) 300 MG capsule Take 300 mg by mouth daily.  . hydrochlorothiazide (HYDRODIURIL) 25 MG tablet Take 1 tablet (25 mg total) by mouth daily.  Marland Kitchen lisinopril (ZESTRIL) 5 MG tablet Take 5 mg by mouth daily.  . meloxicam (MOBIC) 15 MG tablet Take 1 tablet (15 mg total) by mouth daily.  . metoprolol tartrate (LOPRESSOR) 25 MG tablet Take 1 tablet (25 mg total) by mouth 2 (two) times daily.  . Multiple Vitamins-Minerals (MULTIVITAMIN PO) Take 1 tablet by mouth daily.  . Omega-3 Fatty Acids (FISH OIL) 1000 MG CAPS Take 1,000 mg by mouth  daily.  . potassium chloride SA (KLOR-CON) 20 MEQ tablet TAKE ONE TABLET BY MOUTH DAILY.  . sildenafil (REVATIO) 20 MG tablet TAKE 1 TO 2 TABLETS BY MOUTH AS NEEDED  . sildenafil (VIAGRA) 100 MG tablet Take 1 tablet (100 mg total) by mouth daily as needed for erectile dysfunction.  . terbinafine (LAMISIL) 250 MG tablet Take 250 mg by mouth daily.  . traMADol (ULTRAM) 50 MG tablet Take 1 tablet (50 mg total) by mouth every 6 (six) hours as needed.   No facility-administered encounter medications on file as of 12/05/2019.    Review of Systems  Constitutional: Negative for chills and fever.  Eyes: Negative for discharge.  Respiratory: Negative for shortness of breath and wheezing.   Cardiovascular: Negative for chest pain and leg swelling.  Gastrointestinal: Negative for abdominal pain.  Genitourinary: Positive for flank pain and frequency. Negative for decreased urine volume, dysuria, hematuria and urgency.  Musculoskeletal: Negative for back pain and gait problem.  Skin: Negative for rash.  All other systems reviewed and are negative.   Observations/Objective: Patient sounds comfortable and in no acute distress  Assessment and Plan: Problem List Items Addressed This Visit    None    Visit Diagnoses    UTI symptoms    -  Primary   Relevant Medications   sulfamethoxazole-trimethoprim (BACTRIM DS) 800-160 MG tablet       Follow up plan: Return if symptoms worsen or fail to improve.  I discussed the assessment and treatment plan with the patient. The patient was provided an opportunity to ask questions and all were answered. The patient agreed with the plan and demonstrated an understanding of the instructions.   The patient was advised to call back or seek an in-person evaluation if the symptoms worsen or if the condition fails to improve as anticipated.  The above assessment and management plan was discussed with the patient. The patient verbalized understanding of and has  agreed to the management plan. Patient is aware to call the clinic if symptoms persist or worsen. Patient is aware when to return to the clinic for a follow-up visit. Patient educated on when it is appropriate to go to the emergency department.    I provided 6 minutes of non-face-to-face time during this encounter.    Worthy Rancher, MD

## 2020-01-24 ENCOUNTER — Ambulatory Visit: Payer: BC Managed Care – PPO | Attending: Internal Medicine

## 2020-01-24 DIAGNOSIS — Z23 Encounter for immunization: Secondary | ICD-10-CM

## 2020-01-24 NOTE — Progress Notes (Signed)
   [id number]  Name:  Tarrence Romick.    MRN: [id number] DOB: 20-Oct-1956  01/24/2020  Mr. Pari was observed post Covid-19 immunization for 15 minutes without incident. He was provided with Vaccine Information Sheet and instruction to access the V-Safe system.   Mr. Manger was instructed to call 911 with any severe reactions post vaccine: Marland Kitchen Difficulty breathing  . Swelling of face and throat  . A fast heartbeat  . A bad rash all over body  . Dizziness and weakness   Immunizations Administered    Name Date Dose VIS Date Route   Moderna COVID-19 Vaccine 01/24/2020  9:58 AM 0.5 mL 10/01/2019 Intramuscular   Manufacturer: Moderna   Lot: [id number]   [id number]

## 2020-02-25 ENCOUNTER — Ambulatory Visit: Payer: BC Managed Care – PPO | Attending: Internal Medicine

## 2020-02-25 DIAGNOSIS — Z23 Encounter for immunization: Secondary | ICD-10-CM

## 2020-02-25 NOTE — Progress Notes (Signed)
   [id number]  Name:  [name] Foster.    MRN: [id number] DOB: 1956-08-31  02/25/2020  [name] was observed post Covid-19 immunization for 15 minutes without incident. He was provided with Vaccine Information Sheet and instruction to access the V-Safe system.   [name] was instructed to call 911 with any severe reactions post vaccine: Marland Kitchen Difficulty breathing  . Swelling of face and throat  . A fast heartbeat  . A bad rash all over body  . Dizziness and weakness   Immunizations Administered    Name Date Dose VIS Date Route   Moderna COVID-19 Vaccine 02/25/2020 10:35 AM 0.5 mL 10/2019 Intramuscular   Manufacturer: Moderna   Lot: [id number]   [id number]

## 2020-02-26 ENCOUNTER — Other Ambulatory Visit: Payer: Self-pay | Admitting: Family Medicine

## 2020-02-26 DIAGNOSIS — I10 Essential (primary) hypertension: Secondary | ICD-10-CM

## 2020-02-26 MED ORDER — HYDROCHLOROTHIAZIDE 25 MG PO TABS: 25.0000 mg | ORAL_TABLET | Freq: Every day | ORAL | 0 refills | Status: DC

## 2020-02-26 NOTE — Addendum Note (Signed)
Addended by: Milas Hock on: 02/26/2020 12:40 PM   Modules accepted: Orders

## 2020-02-26 NOTE — Telephone Encounter (Signed)
Pt scheduled for CPE with Dr Warrick Parisian 5/21 at 2:10. 30 day supply of HCTZ sent in and pt states he has enough potassium to get him to his appt.

## 2020-02-26 NOTE — Telephone Encounter (Signed)
Dettinger. NTBS LOV 02/20/19.

## 2020-02-27 ENCOUNTER — Encounter (INDEPENDENT_AMBULATORY_CARE_PROVIDER_SITE_OTHER): Payer: Self-pay | Admitting: *Deleted

## 2020-03-04 ENCOUNTER — Other Ambulatory Visit: Payer: Self-pay | Admitting: Family Medicine

## 2020-03-04 DIAGNOSIS — I10 Essential (primary) hypertension: Secondary | ICD-10-CM

## 2020-03-20 ENCOUNTER — Encounter: Payer: BC Managed Care – PPO | Admitting: Family Medicine

## 2020-03-26 ENCOUNTER — Other Ambulatory Visit (INDEPENDENT_AMBULATORY_CARE_PROVIDER_SITE_OTHER): Payer: Self-pay | Admitting: *Deleted

## 2020-03-26 ENCOUNTER — Telehealth (INDEPENDENT_AMBULATORY_CARE_PROVIDER_SITE_OTHER): Payer: Self-pay | Admitting: *Deleted

## 2020-03-26 ENCOUNTER — Encounter (INDEPENDENT_AMBULATORY_CARE_PROVIDER_SITE_OTHER): Payer: Self-pay | Admitting: *Deleted

## 2020-03-26 DIAGNOSIS — Z86010 Personal history of colonic polyps: Secondary | ICD-10-CM

## 2020-03-26 NOTE — Telephone Encounter (Signed)
Referring MD/PCP: dettinger   Procedure: tcs with propofol  Reason/Indication:  Hx polyps  Has patient had this procedure before?  Yes, 2016  If so, when, by whom and where?    Is there a family history of colon cancer?  no  Who?  What age when diagnosed?    Is patient diabetic?   no      Does patient have prosthetic heart valve or mechanical valve?  no  Do you have a pacemaker/defibrillator?  no  Has patient ever had endocarditis/atrial fibrillation? no  Does patient use oxygen? no  Has patient had joint replacement within last 12 months?  no  Is patient constipated or do they take laxatives? no  Does patient have a history of alcohol/drug use?  no  Is patient on blood thinner such as Coumadin, Plavix and/or Aspirin? yes  Medications: asa 81 mg daily, bupropion 150 mg bid, gabapentin 300 mg daily, hctz 25 mg daily, lisinopril 5 mg daily, meloxicam 15 mg dai/ly, metoprolol 25 mg bid, mvi daily, omega 3 1000 mg daily, potassium 20 meq  daily, vigara mg dialy, bactrim 800/160 mg bid, lamisil 250 mg daily, tramadol 50 mg 1 tab every 6 hrs  Allergies: nkda  Medication Adjustment per Dr Laural Golden: asa 2 days  Procedure date & time: 04/10/20

## 2020-03-26 NOTE — Telephone Encounter (Signed)
Patient needs Sutab (copay card) ° °

## 2020-03-30 NOTE — Telephone Encounter (Signed)
Colonoscopy with propofol. 

## 2020-03-31 ENCOUNTER — Other Ambulatory Visit: Payer: Self-pay | Admitting: Family Medicine

## 2020-03-31 MED ORDER — SUTAB 1479-225-188 MG PO TABS: 1.0000 | ORAL_TABLET | Freq: Once | ORAL | 0 refills | Status: AC

## 2020-03-31 NOTE — Telephone Encounter (Signed)
Last office visit 12/05/2019 Last refill 10/14/2019, #20, 1 refill

## 2020-04-06 NOTE — Patient Instructions (Signed)
[name] Foster.  04/06/2020     @PREFPERIOPPHARMACY @   Your procedure is scheduled on  04/10/2020  Report to [facility] at  0700  A.M.  Call this number if you have problems the morning of surgery:  [phone]   Remember:  Follow the diet and prep instructions given to you by Dr Olevia Perches office.                      Take these medicines the morning of surgery with A SIP OF WATER  Metoprolol.    Do not wear jewelry, make-up or nail polish.  Do not wear lotions, powders, or perfumes. Please wear deodorant and brush your teeth.  Do not shave 48 hours prior to surgery.  Men may shave face and neck.  Do not bring valuables to the hospital.  [facility] is not responsible for any belongings or valuables.  Contacts, dentures or bridgework may not be worn into surgery.  Leave your suitcase in the car.  After surgery it may be brought to your room.  For patients admitted to the hospital, discharge time will be determined by your treatment team.  Patients discharged the day of surgery will not be allowed to drive home.   Name and phone number of your driver:   family Special instructions:  DO NOT smoke the morning of your procedure.  Please read over the following fact sheets that you were given. Anesthesia Post-op Instructions and Care and Recovery After Surgery       Colonoscopy, Adult, Care After This sheet gives you information about how to care for yourself after your procedure. Your health care provider may also give you more specific instructions. If you have problems or questions, contact your health care provider. What can I expect after the procedure? After the procedure, it is common to have:  A small amount of blood in your stool for 24 hours after the procedure.  Some gas.  Mild cramping or bloating of your abdomen. Follow these instructions at home: Eating and drinking   Drink enough fluid to keep your urine pale yellow.  Follow instructions from  your health care provider about eating or drinking restrictions.  Resume your normal diet as instructed by your health care provider. Avoid heavy or fried foods that are hard to digest. Activity  Rest as told by your health care provider.  Avoid sitting for a long time without moving. Get up to take short walks every 1-2 hours. This is important to improve blood flow and breathing. Ask for help if you feel weak or unsteady.  Return to your normal activities as told by your health care provider. Ask your health care provider what activities are safe for you. Managing cramping and bloating   Try walking around when you have cramps or feel bloated.  Apply heat to your abdomen as told by your health care provider. Use the heat source that your health care provider recommends, such as a moist heat pack or a heating pad. ? Place a towel between your skin and the heat source. ? Leave the heat on for 20-30 minutes. ? Remove the heat if your skin turns bright red. This is especially important if you are unable to feel pain, heat, or cold. You may have a greater risk of getting burned. General instructions  For the first 24 hours after the procedure: ? Do not drive or use machinery. ? Do not sign important  documents. ? Do not drink alcohol. ? Do your regular daily activities at a slower pace than normal. ? Eat soft foods that are easy to digest.  Take over-the-counter and prescription medicines only as told by your health care provider.  Keep all follow-up visits as told by your health care provider. This is important. Contact a health care provider if:  You have blood in your stool 2-3 days after the procedure. Get help right away if you have:  More than a small spotting of blood in your stool.  Large blood clots in your stool.  Swelling of your abdomen.  Nausea or vomiting.  A fever.  Increasing pain in your abdomen that is not relieved with medicine. Summary  After the  procedure, it is common to have a small amount of blood in your stool. You may also have mild cramping and bloating of your abdomen.  For the first 24 hours after the procedure, do not drive or use machinery, sign important documents, or drink alcohol.  Get help right away if you have a lot of blood in your stool, nausea or vomiting, a fever, or increased pain in your abdomen. This information is not intended to replace advice given to you by your health care provider. Make sure you discuss any questions you have with your health care provider. Document Revised: 05/13/2019 Document Reviewed: 05/13/2019 Elsevier Patient Education  [place] After These instructions provide you with information about caring for yourself after your procedure. Your health care provider may also give you more specific instructions. Your treatment has been planned according to current medical practices, but problems sometimes occur. Call your health care provider if you have any problems or questions after your procedure. What can I expect after the procedure? After your procedure, you may:  Feel sleepy for several hours.  Feel clumsy and have poor balance for several hours.  Feel forgetful about what happened after the procedure.  Have poor judgment for several hours.  Feel nauseous or vomit.  Have a sore throat if you had a breathing tube during the procedure. Follow these instructions at home: For at least 24 hours after the procedure:      Have a responsible adult stay with you. It is important to have someone help care for you until you are awake and alert.  Rest as needed.  Do not: ? Participate in activities in which you could fall or become injured. ? Drive. ? Use heavy machinery. ? Drink alcohol. ? Take sleeping pills or medicines that cause drowsiness. ? Make important decisions or sign legal documents. ? Take care of children on your own. Eating  and drinking  Follow the diet that is recommended by your health care provider.  If you vomit, drink water, juice, or soup when you can drink without vomiting.  Make sure you have little or no nausea before eating solid foods. General instructions  Take over-the-counter and prescription medicines only as told by your health care provider.  If you have sleep apnea, surgery and certain medicines can increase your risk for breathing problems. Follow instructions from your health care provider about wearing your sleep device: ? Anytime you are sleeping, including during daytime naps. ? While taking prescription pain medicines, sleeping medicines, or medicines that make you drowsy.  If you smoke, do not smoke without supervision.  Keep all follow-up visits as told by your health care provider. This is important. Contact a health care provider if:  You keep feeling nauseous or you keep vomiting.  You feel light-headed.  You develop a rash.  You have a fever. Get help right away if:  You have trouble breathing. Summary  For several hours after your procedure, you may feel sleepy and have poor judgment.  Have a responsible adult stay with you for at least 24 hours or until you are awake and alert. This information is not intended to replace advice given to you by your health care provider. Make sure you discuss any questions you have with your health care provider. Document Revised: 01/15/2018 Document Reviewed: 02/07/2016 Elsevier Patient Education  [place].

## 2020-04-08 ENCOUNTER — Encounter (HOSPITAL_COMMUNITY)
Admission: RE | Admit: 2020-04-08 | Discharge: 2020-04-08 | Disposition: A | Discharge status: Home / Self Care | Payer: BC Managed Care – PPO | Source: Ambulatory Visit | Attending: Internal Medicine | Admitting: Internal Medicine

## 2020-04-08 ENCOUNTER — Encounter (HOSPITAL_COMMUNITY): Payer: Self-pay

## 2020-04-08 ENCOUNTER — Other Ambulatory Visit: Payer: Self-pay

## 2020-04-08 ENCOUNTER — Other Ambulatory Visit (HOSPITAL_COMMUNITY)
Admission: RE | Admit: 2020-04-08 | Discharge: 2020-04-08 | Disposition: A | Discharge status: Home / Self Care | Payer: BC Managed Care – PPO | Source: Ambulatory Visit | Attending: Internal Medicine | Admitting: Internal Medicine

## 2020-04-08 DIAGNOSIS — K5520 Angiodysplasia of colon without hemorrhage: Secondary | ICD-10-CM | POA: Diagnosis not present

## 2020-04-08 DIAGNOSIS — R0602 Shortness of breath: Secondary | ICD-10-CM | POA: Diagnosis not present

## 2020-04-08 DIAGNOSIS — Z833 Family history of diabetes mellitus: Secondary | ICD-10-CM | POA: Diagnosis not present

## 2020-04-08 DIAGNOSIS — Z8249 Family history of ischemic heart disease and other diseases of the circulatory system: Secondary | ICD-10-CM | POA: Diagnosis not present

## 2020-04-08 DIAGNOSIS — F17210 Nicotine dependence, cigarettes, uncomplicated: Secondary | ICD-10-CM | POA: Diagnosis not present

## 2020-04-08 DIAGNOSIS — Z860100 Personal history of colon polyps, unspecified: Secondary | ICD-10-CM

## 2020-04-08 DIAGNOSIS — Z7982 Long term (current) use of aspirin: Secondary | ICD-10-CM | POA: Diagnosis not present

## 2020-04-08 DIAGNOSIS — Z20822 Contact with and (suspected) exposure to covid-19: Secondary | ICD-10-CM | POA: Insufficient documentation

## 2020-04-08 DIAGNOSIS — I10 Essential (primary) hypertension: Secondary | ICD-10-CM | POA: Diagnosis not present

## 2020-04-08 DIAGNOSIS — I209 Angina pectoris, unspecified: Secondary | ICD-10-CM | POA: Diagnosis not present

## 2020-04-08 DIAGNOSIS — D123 Benign neoplasm of transverse colon: Secondary | ICD-10-CM | POA: Diagnosis not present

## 2020-04-08 DIAGNOSIS — I422 Other hypertrophic cardiomyopathy: Secondary | ICD-10-CM | POA: Diagnosis not present

## 2020-04-08 DIAGNOSIS — N400 Enlarged prostate without lower urinary tract symptoms: Secondary | ICD-10-CM | POA: Diagnosis not present

## 2020-04-08 DIAGNOSIS — M1990 Unspecified osteoarthritis, unspecified site: Secondary | ICD-10-CM | POA: Diagnosis not present

## 2020-04-08 DIAGNOSIS — Z79899 Other long term (current) drug therapy: Secondary | ICD-10-CM | POA: Diagnosis not present

## 2020-04-08 DIAGNOSIS — D125 Benign neoplasm of sigmoid colon: Secondary | ICD-10-CM | POA: Diagnosis not present

## 2020-04-08 DIAGNOSIS — Z791 Long term (current) use of non-steroidal anti-inflammatories (NSAID): Secondary | ICD-10-CM | POA: Diagnosis not present

## 2020-04-08 DIAGNOSIS — K648 Other hemorrhoids: Secondary | ICD-10-CM | POA: Diagnosis not present

## 2020-04-08 DIAGNOSIS — Z1211 Encounter for screening for malignant neoplasm of colon: Secondary | ICD-10-CM | POA: Diagnosis not present

## 2020-04-08 DIAGNOSIS — Z01812 Encounter for preprocedural laboratory examination: Secondary | ICD-10-CM | POA: Insufficient documentation

## 2020-04-08 DIAGNOSIS — E876 Hypokalemia: Secondary | ICD-10-CM | POA: Diagnosis not present

## 2020-04-08 DIAGNOSIS — K5730 Diverticulosis of large intestine without perforation or abscess without bleeding: Secondary | ICD-10-CM | POA: Diagnosis not present

## 2020-04-08 LAB — SARS CORONAVIRUS 2 (TAT 6-24 HRS): SARS Coronavirus 2: NEGATIVE

## 2020-04-10 ENCOUNTER — Ambulatory Visit (HOSPITAL_COMMUNITY): Payer: Medicare Other | Admitting: Anesthesiology

## 2020-04-10 ENCOUNTER — Other Ambulatory Visit: Payer: Self-pay

## 2020-04-10 ENCOUNTER — Ambulatory Visit (HOSPITAL_COMMUNITY)
Admission: RE | Admit: 2020-04-10 | Discharge: 2020-04-10 | Disposition: A | Discharge status: Home / Self Care | Payer: Medicare Other | Attending: Internal Medicine | Admitting: Internal Medicine

## 2020-04-10 ENCOUNTER — Encounter (HOSPITAL_COMMUNITY): Payer: Self-pay | Admitting: Internal Medicine

## 2020-04-10 ENCOUNTER — Encounter (HOSPITAL_COMMUNITY)
Admission: RE | Disposition: A | Discharge status: Home / Self Care | Payer: Self-pay | Source: Home / Self Care | Attending: Internal Medicine

## 2020-04-10 DIAGNOSIS — K5730 Diverticulosis of large intestine without perforation or abscess without bleeding: Secondary | ICD-10-CM | POA: Insufficient documentation

## 2020-04-10 DIAGNOSIS — Z79899 Other long term (current) drug therapy: Secondary | ICD-10-CM | POA: Insufficient documentation

## 2020-04-10 DIAGNOSIS — Z1211 Encounter for screening for malignant neoplasm of colon: Secondary | ICD-10-CM | POA: Diagnosis not present

## 2020-04-10 DIAGNOSIS — K648 Other hemorrhoids: Secondary | ICD-10-CM | POA: Insufficient documentation

## 2020-04-10 DIAGNOSIS — D123 Benign neoplasm of transverse colon: Secondary | ICD-10-CM | POA: Insufficient documentation

## 2020-04-10 DIAGNOSIS — I209 Angina pectoris, unspecified: Secondary | ICD-10-CM | POA: Insufficient documentation

## 2020-04-10 DIAGNOSIS — I10 Essential (primary) hypertension: Secondary | ICD-10-CM | POA: Insufficient documentation

## 2020-04-10 DIAGNOSIS — D125 Benign neoplasm of sigmoid colon: Secondary | ICD-10-CM | POA: Insufficient documentation

## 2020-04-10 DIAGNOSIS — Z86010 Personal history of colonic polyps: Secondary | ICD-10-CM | POA: Insufficient documentation

## 2020-04-10 DIAGNOSIS — E876 Hypokalemia: Secondary | ICD-10-CM | POA: Insufficient documentation

## 2020-04-10 DIAGNOSIS — K5520 Angiodysplasia of colon without hemorrhage: Secondary | ICD-10-CM

## 2020-04-10 DIAGNOSIS — Z7982 Long term (current) use of aspirin: Secondary | ICD-10-CM | POA: Insufficient documentation

## 2020-04-10 DIAGNOSIS — F17210 Nicotine dependence, cigarettes, uncomplicated: Secondary | ICD-10-CM | POA: Insufficient documentation

## 2020-04-10 DIAGNOSIS — Z8249 Family history of ischemic heart disease and other diseases of the circulatory system: Secondary | ICD-10-CM | POA: Insufficient documentation

## 2020-04-10 DIAGNOSIS — I422 Other hypertrophic cardiomyopathy: Secondary | ICD-10-CM | POA: Insufficient documentation

## 2020-04-10 DIAGNOSIS — R0602 Shortness of breath: Secondary | ICD-10-CM | POA: Insufficient documentation

## 2020-04-10 DIAGNOSIS — N400 Enlarged prostate without lower urinary tract symptoms: Secondary | ICD-10-CM | POA: Insufficient documentation

## 2020-04-10 DIAGNOSIS — Z791 Long term (current) use of non-steroidal anti-inflammatories (NSAID): Secondary | ICD-10-CM | POA: Insufficient documentation

## 2020-04-10 DIAGNOSIS — M1990 Unspecified osteoarthritis, unspecified site: Secondary | ICD-10-CM | POA: Insufficient documentation

## 2020-04-10 DIAGNOSIS — Z833 Family history of diabetes mellitus: Secondary | ICD-10-CM | POA: Insufficient documentation

## 2020-04-10 HISTORY — PX: POLYPECTOMY: SHX5525

## 2020-04-10 HISTORY — PX: COLONOSCOPY WITH PROPOFOL: SHX5780

## 2020-04-10 SURGERY — COLONOSCOPY WITH PROPOFOL
Anesthesia: General

## 2020-04-10 MED ORDER — PROPOFOL 10 MG/ML IV BOLUS
INTRAVENOUS | Status: AC
  Filled 2020-04-10: qty 20

## 2020-04-10 MED ORDER — STERILE WATER FOR IRRIGATION IR SOLN
Status: DC | PRN
  Administered 2020-04-10: 2.5 mL

## 2020-04-10 MED ORDER — LACTATED RINGERS IV SOLN: INTRAVENOUS | Status: DC | PRN

## 2020-04-10 MED ORDER — PHENYLEPHRINE HCL (PRESSORS) 10 MG/ML IV SOLN
INTRAVENOUS | Status: DC | PRN
  Administered 2020-04-10 (×2): 80 ug via INTRAVENOUS

## 2020-04-10 MED ORDER — CHLORHEXIDINE GLUCONATE CLOTH 2 % EX PADS: 6.0000 | MEDICATED_PAD | Freq: Once | CUTANEOUS | Status: DC

## 2020-04-10 MED ORDER — PROPOFOL 500 MG/50ML IV EMUL
INTRAVENOUS | Status: DC | PRN
  Administered 2020-04-10: 150 ug/kg/min via INTRAVENOUS

## 2020-04-10 MED ORDER — LACTATED RINGERS IV SOLN: Freq: Once | INTRAVENOUS | Status: AC

## 2020-04-10 MED ORDER — PROPOFOL 10 MG/ML IV BOLUS
INTRAVENOUS | Status: DC | PRN
  Administered 2020-04-10: 100 mg via INTRAVENOUS

## 2020-04-10 MED ORDER — PHENYLEPHRINE 40 MCG/ML (10ML) SYRINGE FOR IV PUSH (FOR BLOOD PRESSURE SUPPORT)
PREFILLED_SYRINGE | INTRAVENOUS | Status: AC
  Filled 2020-04-10: qty 10

## 2020-04-10 NOTE — Transfer of Care (Signed)
Immediate Anesthesia Transfer of Care Note  Patient: [name] Foster.  Procedure(s) Performed: COLONOSCOPY WITH PROPOFOL (N/A ) POLYPECTOMY  Patient Location: PACU  Anesthesia Type:General  Level of Consciousness: awake, alert , oriented and patient cooperative  Airway & Oxygen Therapy: Patient Spontanous Breathing  Post-op Assessment: Report given to RN and Patient moving all extremities X 4  Post vital signs: Reviewed and stable  Last Vitals:  Vitals Value Taken Time  BP    Temp    Pulse 75 04/10/20 0900  Resp    SpO2 90 % 04/10/20 0900  Vitals shown include unvalidated device data.  Last Pain:  Vitals:   04/10/20 0724  TempSrc: Oral  PainSc: 1       Patients Stated Pain Goal: 4 (63/01/60 1093)  Complications: No complications documented.

## 2020-04-10 NOTE — Op Note (Signed)
[facility] Patient Name: [name] Foster Procedure Date: 04/10/2020 8:06 AM MRN: [id number] Date of Birth: 19-Aug-1956 Attending MD: Hildred Laser , MD CSN: [id number] Age: 64 Admit Type: Outpatient Procedure:                Colonoscopy Indications:              High risk colon cancer surveillance: Personal                            history of colonic polyps Providers:                Hildred Laser, MD, Cleo E. Montpellier RN, RN, Lurline Del, RN, Randa Spike, Technician Referring MD:             Simona Huh, NP Medicines:                Propofol per Anesthesia Complications:            No immediate complications. Estimated Blood Loss:     Estimated blood loss: none. Estimated blood loss                            was minimal. Procedure:                Pre-Anesthesia Assessment:                           - Prior to the procedure, a History and Physical                            was performed, and patient medications and                            allergies were reviewed. The patient's tolerance of                            previous anesthesia was also reviewed. The risks                            and benefits of the procedure and the sedation                            options and risks were discussed with the patient.                            All questions were answered, and informed consent                            was obtained. Prior Anticoagulants: The patient has                            taken no previous anticoagulant or antiplatelet  agents except for aspirin. ASA Grade Assessment: II                            - A patient with mild systemic disease. After                            reviewing the risks and benefits, the patient was                            deemed in satisfactory condition to undergo the                            procedure.                           After obtaining informed consent, the  colonoscope                            was passed under direct vision. Throughout the                            procedure, the patient's blood pressure, pulse, and                            oxygen saturations were monitored continuously. The                            [id number]-[id number] ([id number]) scope was introduced through                            the and advanced to the the cecum, identified by                            appendiceal orifice and ileocecal valve. The                            colonoscopy was performed without difficulty. The                            patient tolerated the procedure well. The quality                            of the bowel preparation was good. The ileocecal                            valve, appendiceal orifice, and rectum were                            photographed. Scope In: 8:26:33 AM Scope Out: 8:53:11 AM Scope Withdrawal Time: 0 hours 17 minutes 43 seconds  Total Procedure Duration: 0 hours 26 minutes 38 seconds  Findings:      The perianal and digital rectal examinations were normal.      Three small angioectasias without bleeding were found in the cecum.  A small polyp was found in the proximal transverse colon. The polyp was       sessile. The polyp was removed with a cold snare. Resection and       retrieval were complete. The pathology specimen was placed into Bottle       Number 1.      Three polyps were found in the sigmoid colon and transverse colon. The       polyps were diminutive in size. These were biopsied with a cold forceps       for histology. The pathology specimen was placed into Bottle Number 1.      A few small-mouthed diverticula were found in the sigmoid colon.      Internal hemorrhoids were found during retroflexion. The hemorrhoids       were small. Impression:               - Three non-bleeding colonic angioectasias.                           - One small polyp in the proximal transverse colon,                             removed with a cold snare. Resected and retrieved.                           - Three diminutive polyps in the sigmoid colon and                            in the transverse colon. Biopsied.                           - Diverticulosis in the sigmoid colon.                           - Internal hemorrhoids. Moderate Sedation:      Per Anesthesia Care Recommendation:           - Patient has a contact number available for                            emergencies. The signs and symptoms of potential                            delayed complications were discussed with the                            patient. Return to normal activities tomorrow.                            Written discharge instructions were provided to the                            patient.                           - High fiber diet today.                           -  Continue present medications.                           - No aspirin, ibuprofen, naproxen, or other                            non-steroidal anti-inflammatory drugs for 1 day.                           - Await pathology results.                           - Repeat colonoscopy is recommended. The                            colonoscopy date will be determined after pathology                            results from today's exam become available for                            review. Procedure Code(s):        --- Professional ---                           [id number], Colonoscopy, flexible; with removal of                            tumor(s), polyp(s), or other lesion(s) by snare                            technique                           45380, 25, Colonoscopy, flexible; with biopsy,                            single or multiple Diagnosis Code(s):        --- Professional ---                           K63.5, Polyp of colon                           Z86.010, Personal history of colonic polyps                           K55.20, Angiodysplasia of colon without hemorrhage                            K64.8, Other hemorrhoids                           K57.30, Diverticulosis of large intestine without                            perforation or abscess without bleeding CPT copyright 2019 American Medical  Association. All rights reserved. The codes documented in this report are preliminary and upon coder review may  be revised to meet current compliance requirements. Hildred Laser, MD Hildred Laser, MD 04/10/2020 9:03:26 AM This report has been signed electronically. Number of Addenda: 0

## 2020-04-10 NOTE — Anesthesia Preprocedure Evaluation (Signed)
Anesthesia Evaluation  Patient identified by MRN, date of birth, ID band Patient awake    Reviewed: Allergy & Precautions, NPO status , Patient's Chart, lab work & pertinent test results, reviewed documented beta blocker date and time   History of Anesthesia Complications Negative for: history of anesthetic complications  Airway Mallampati: II  TM Distance: >3 FB Neck ROM: Full    Dental  (+) Dental Advisory Given, Missing   Pulmonary shortness of breath and with exertion, Current Smoker and Patient abstained from smoking.,    Pulmonary exam normal breath sounds clear to auscultation       Cardiovascular METS: 3 - Mets hypertension, Pt. on medications and Pt. on home beta blockers + angina (occasionally) (-) CHF (apical variant hypertrphic cardiomyopathy) Normal cardiovascular exam Rhythm:Regular Rate:Normal - Systolic murmurs, - Diastolic murmurs, - Friction Rub, - Carotid Bruit, - Peripheral Edema and - Systolic Click Sinus  Rhythm  -Anteroseptal infarct -age undetermined.   -  T-abnormality  - Anterolateral ischemia.     Neuro/Psych negative neurological ROS  negative psych ROS   GI/Hepatic Neg liver ROS, Bowel prep,  Endo/Other  negative endocrine ROS  Renal/GU Renal disease (right kidney mass, partial nephrectomy?)  negative genitourinary   Musculoskeletal  (+) Arthritis ,   Abdominal   Peds negative pediatric ROS (+)  Hematology negative hematology ROS (+)   Anesthesia Other Findings Left ventricle: The cavity size was normal. There was mild  concentric hypertrophy. Systolic function was normal. The  estimated ejection fraction was in the range of 60% to  65%. Wall motion was normal; there were no regional wall  motion abnormalities. Doppler parameters are consistent  with abnormal left ventricular relaxation (grade 1  diastolic dysfunction). The E/e' ratio is <10 , suggesting  normal LV  filling pressure.  - Left atrium: The atrium was normal in size.  - Inferior vena cava: The vessel was normal in size; the  respirophasic diameter changes were in the normal range (=  50%); findings are consistent with normal central venous  pressure.  - Pericardium, extracardiac: There was no pericardial  effusion.    Reproductive/Obstetrics                            Anesthesia Physical Anesthesia Plan  ASA: II  Anesthesia Plan: General   Post-op Pain Management:    Induction: Intravenous  PONV Risk Score and Plan: 1 and TIVA  Airway Management Planned: Nasal Cannula, Natural Airway and Simple Face Mask  Additional Equipment:   Intra-op Plan:   Post-operative Plan:   Informed Consent: I have reviewed the patients History and Physical, chart, labs and discussed the procedure including the risks, benefits and alternatives for the proposed anesthesia with the patient or authorized representative who has indicated his/her understanding and acceptance.     Dental advisory given  Plan Discussed with: CRNA and Surgeon  Anesthesia Plan Comments:         Anesthesia Quick Evaluation

## 2020-04-10 NOTE — H&P (Signed)
[name] Foster. is an 64 y.o. male.   Chief Complaint: Patient is here for colonoscopy. HPI: Patient is 64 year old Afro-American male who has a history of colonic adenomas and is for surveillance colonoscopy.  His last exam was in April 2016 with removal of cecal and rectal polyps and these are tubular adenomas.  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  He denies change in bowel habits or rectal bleeding.  He complains of periumbilical pain which started 2 or 3 days ago.  He states the pain is now less.  He has not noted any rash.  He has not experienced nausea vomiting fever or chills. He has not taken aspirin in the last 3 to 4 days.  He takes meloxicam occasionally for shoulder pain. Family history is negative for CRC.  Past Medical History:  Diagnosis Date  . Abnormal EKG   . Apical variant hypertrophic cardiomyopathy ([place])   . Benign essential hypertension   . BPH (benign prostatic hyperplasia)   . Hypokalemia   . Tobacco abuse     Past Surgical History:  Procedure Laterality Date  . CARDIAC CATHETERIZATION     2008  . COLONOSCOPY N/A 02/26/2015   Procedure: COLONOSCOPY;  Surgeon: Rogene Houston, MD;  [facility];  Service: Endoscopy;  Laterality: N/A;  830  . KIDNEY SURGERY Right April 2015   benign tumor removal  . PROSTATE ABLATION  12-2015 at [facility]  . SHOULDER ACROMIOPLASTY Left 01/18/2016   Procedure: SHOULDER ACROMIOPLASTY;  Surgeon: Milly Jakob, MD;  Location: [facility];  Service: Orthopedics;  Laterality: Left;  . SHOULDER ARTHROSCOPY WITH ROTATOR CUFF REPAIR AND SUBACROMIAL DECOMPRESSION Left 01/18/2016   Procedure: LEFT SHOULDER ARTHROSCOPY WITH ROTATOR CUFF REPAIR AND SUBACROMIAL DECOMPRESSION;  Surgeon: Milly Jakob, MD;  Location: [facility];  Service: Orthopedics;  Laterality: Left;  PRE-OP BLOCK WITH GENERAL ANESTHESIA    Family History  Problem Relation Age of Onset  . Diabetes Mother   . Diabetes Other   .  Hypertension Other    Social History:  reports that he has been smoking cigarettes. He has a 28.00 pack-year smoking history. He has never used smokeless tobacco. He reports that he does not drink alcohol and does not use drugs.  Allergies: No Known Allergies  Medications Prior to Admission  Medication Sig Dispense Refill  . aspirin EC 81 MG tablet Take 81 mg by mouth daily.    . hydrochlorothiazide (HYDRODIURIL) 25 MG tablet Take 1 tablet (25 mg total) by mouth daily. 30 tablet 0  . metoprolol tartrate (LOPRESSOR) 25 MG tablet TAKE ONE TABLET BY MOUTH TWICE DAILY. (Patient taking differently: Take 25 mg by mouth 2 (two) times daily. ) 180 tablet 0  . Multiple Vitamins-Minerals (MULTIVITAMIN PO) Take 1 tablet by mouth daily.    . Omega-3 Fatty Acids (FISH OIL) 1000 MG CAPS Take 1,000 mg by mouth daily.    . potassium chloride SA (KLOR-CON) 20 MEQ tablet TAKE ONE TABLET BY MOUTH DAILY. (Patient taking differently: Take 20 mEq by mouth daily. ) 90 tablet 1  . amoxicillin-clavulanate (AUGMENTIN) 875-125 MG tablet Take 1 tablet by mouth every 12 (twelve) hours. (Patient not taking: Reported on 04/02/2020) 20 tablet 0  . buPROPion (WELLBUTRIN SR) 150 MG 12 hr tablet Take 1 tablet (150 mg total) by mouth 2 (two) times daily. (Patient not taking: Reported on 04/02/2020) 180 tablet 3  . meloxicam (MOBIC) 15 MG tablet Take 1 tablet (15 mg total) by  mouth daily. (Patient not taking: Reported on 04/02/2020) 90 tablet 3  . nitroGLYCERIN (NITROSTAT) 0.4 MG SL tablet Place 0.4 mg under the tongue every 5 (five) minutes as needed for chest pain.     . sildenafil (REVATIO) 20 MG tablet TAKE 1 TO 2 TABLETS BY MOUTH AS NEEDED (Patient not taking: Reported on 04/02/2020) 30 tablet 0  . sildenafil (VIAGRA) 100 MG tablet TAKE ONE TABLET BY MOUTH DAILY AS NEEDED. (Patient not taking: Reported on 04/02/2020) 20 tablet 1  . sulfamethoxazole-trimethoprim (BACTRIM DS) 800-160 MG tablet Take 1 tablet by mouth 2 (two) times daily.  (Patient not taking: Reported on 04/02/2020) 20 tablet 0  . traMADol (ULTRAM) 50 MG tablet Take 1 tablet (50 mg total) by mouth every 6 (six) hours as needed. (Patient not taking: Reported on 04/02/2020) 12 tablet 0    Results for orders placed or performed during the hospital encounter of 04/08/20 (from the past 48 hour(s))  SARS CORONAVIRUS 2 (TAT 6-24 HRS) Nasopharyngeal Nasopharyngeal Swab     Status: None   Collection Time: 04/08/20 10:08 AM   Specimen: Nasopharyngeal Swab  Result Value Ref Range   SARS Coronavirus 2 NEGATIVE NEGATIVE    Comment: (NOTE) SARS-CoV-2 target nucleic acids are NOT DETECTED. The SARS-CoV-2 RNA is generally detectable in upper and lower respiratory specimens during the acute phase of infection. Negative results do not preclude SARS-CoV-2 infection, do not rule out co-infections with other pathogens, and should not be used as the sole basis for treatment or other patient management decisions. Negative results must be combined with clinical observations, patient history, and epidemiological information. The expected result is Negative. Fact Sheet for Patients: SugarRoll.be Fact Sheet for Healthcare Providers: https://www.woods-mathews.com/ This test is not yet approved or cleared by the [place] FDA and  has been authorized for detection and/or diagnosis of SARS-CoV-2 by FDA under an Emergency Use Authorization (EUA). This EUA will remain  in effect (meaning this test can be used) for the duration of the COVID-19 declaration under Section 56 4(b)(1) of the Act, 21 U.S.C. section 360bbb-3(b)(1), unless the authorization is terminated or revoked sooner. Performed at [facility], [place] [address] [place], [place] [address]    No results found.  Review of Systems  Blood pressure 126/80, pulse 79, temperature 98.2 F (36.8 C), temperature source Oral, resp. rate 18, height 5' 10.5" (1.791 m), weight 80.7  kg, SpO2 98 %. Physical Exam  HENT:  Mouth/Throat: Mucous membranes are moist. Oropharynx is clear.  Eyes: Conjunctivae are normal. No scleral icterus.  Cardiovascular: Normal rate, regular rhythm and normal heart sounds.  No murmur heard. Respiratory: Effort normal and breath sounds normal.  GI: Soft. Normal appearance. He exhibits no distension. There is no abdominal tenderness.  Abdominal exam reveals no evidence of umbilical hernia or skin rash.  He has mild tenderness to right of umbilicus.  Musculoskeletal:        General: No swelling.     Cervical back: Neck supple.  Lymphadenopathy:    He has no cervical adenopathy.  Neurological: He is alert.  Skin: Skin is warm and dry.     Assessment/Plan History of colonic adenomas Surveillance colonoscopy.  Hildred Laser, MD 04/10/2020, 7:29 AM

## 2020-04-10 NOTE — Anesthesia Postprocedure Evaluation (Signed)
Anesthesia Post Note  Patient: Gerhart Ruggieri.  Procedure(s) Performed: COLONOSCOPY WITH PROPOFOL (N/A ) POLYPECTOMY  Patient location during evaluation: PACU Anesthesia Type: General Level of consciousness: awake, oriented, awake and alert and patient cooperative Pain management: satisfactory to patient Vital Signs Assessment: post-procedure vital signs reviewed and stable Respiratory status: spontaneous breathing, respiratory function stable and nonlabored ventilation Cardiovascular status: stable Postop Assessment: no apparent nausea or vomiting Anesthetic complications: no   No complications documented.   Last Vitals:  Vitals:   04/10/20 0724  BP: 126/80  Pulse: 79  Resp: 18  Temp: 36.8 C  SpO2: 98%    Last Pain:  Vitals:   04/10/20 0724  TempSrc: Oral  PainSc: 1                  Jerimyah Vandunk

## 2020-04-10 NOTE — Discharge Instructions (Signed)
Notes from [place]:  No aspirin or NSAIDs for 24 hours. Resume other medications as before. High-fiber diet. No driving for 24 hours. Physician will call with biopsy results and further recommendations.   Monitored Anesthesia Care, Care After These instructions provide you with information about caring for yourself after your procedure. Your health care provider may also give you more specific instructions. Your treatment has been planned according to current medical practices, but problems sometimes occur. Call your health care provider if you have any problems or questions after your procedure. What can I expect after the procedure? After your procedure, you may:  Feel sleepy for several hours.  Feel clumsy and have poor balance for several hours.  Feel forgetful about what happened after the procedure.  Have poor judgment for several hours.  Feel nauseous or vomit.  Have a sore throat if you had a breathing tube during the procedure. Follow these instructions at home: For at least 24 hours after the procedure:      Have a responsible adult stay with you. It is important to have someone help care for you until you are awake and alert.  Rest as needed.  Do not: ? Participate in activities in which you could fall or become injured. ? Drive. ? Use heavy machinery. ? Drink alcohol. ? Take sleeping pills or medicines that cause drowsiness. ? Make important decisions or sign legal documents. ? Take care of children on your own. Eating and drinking  Follow the diet that is recommended by your health care provider.  If you vomit, drink water, juice, or soup when you can drink without vomiting.  Make sure you have little or no nausea before eating solid foods. General instructions  Take over-the-counter and prescription medicines only as told by your health care provider.  If you have sleep apnea, surgery and certain medicines can increase your risk for breathing  problems. Follow instructions from your health care provider about wearing your sleep device: ? Anytime you are sleeping, including during daytime naps. ? While taking prescription pain medicines, sleeping medicines, or medicines that make you drowsy.  If you smoke, do not smoke without supervision.  Keep all follow-up visits as told by your health care provider. This is important. Contact a health care provider if:  You keep feeling nauseous or you keep vomiting.  You feel light-headed.  You develop a rash.  You have a fever. Get help right away if:  You have trouble breathing. Summary  For several hours after your procedure, you may feel sleepy and have poor judgment.  Have a responsible adult stay with you for at least 24 hours or until you are awake and alert. This information is not intended to replace advice given to you by your health care provider. Make sure you discuss any questions you have with your health care provider. Document Revised: 01/15/2018 Document Reviewed: 02/07/2016 Elsevier Patient Education  [place].  Diverticulosis  Diverticulosis is a condition that develops when small pouches (diverticula) form in the wall of the large intestine (colon). The colon is where water is absorbed and stool (feces) is formed. The pouches form when the inside layer of the colon pushes through weak spots in the outer layers of the colon. You may have a few pouches or many of them. The pouches usually do not cause problems unless they become inflamed or infected. When this happens, the condition is called diverticulitis. What are the causes? The cause of this condition is not known.  What increases the risk? The following factors may make you more likely to develop this condition:  Being older than age 80. Your risk for this condition increases with age. Diverticulosis is rare among people younger than age 60. By age 73, many people have it.  Eating a low-fiber  diet.  Having frequent constipation.  Being overweight.  Not getting enough exercise.  Smoking.  Taking over-the-counter pain medicines, like aspirin and ibuprofen.  Having a family history of diverticulosis. What are the signs or symptoms? In most people, there are no symptoms of this condition. If you do have symptoms, they may include:  Bloating.  Cramps in the abdomen.  Constipation or diarrhea.  Pain in the lower left side of the abdomen. How is this diagnosed? Because diverticulosis usually has no symptoms, it is most often diagnosed during an exam for other colon problems. The condition may be diagnosed by:  Using a flexible scope to examine the colon (colonoscopy).  Taking an X-ray of the colon after dye has been put into the colon (barium enema).  Having a CT scan. How is this treated? You may not need treatment for this condition. Your health care provider may recommend treatment to prevent problems. You may need treatment if you have symptoms or if you previously had diverticulitis. Treatment may include:  Eating a high-fiber diet.  Taking a fiber supplement.  Taking a live bacteria supplement (probiotic).  Taking medicine to relax your colon. Follow these instructions at home: Medicines  Take over-the-counter and prescription medicines only as told by your health care provider.  If told by your health care provider, take a fiber supplement or probiotic. Constipation prevention Your condition may cause constipation. To prevent or treat constipation, you may need to:  Drink enough fluid to keep your urine pale yellow.  Take over-the-counter or prescription medicines.  Eat foods that are high in fiber, such as beans, whole grains, and fresh fruits and vegetables.  Limit foods that are high in fat and processed sugars, such as fried or sweet foods.  General instructions  Try not to strain when you have a bowel movement.  Keep all follow-up visits  as told by your health care provider. This is important. Contact a health care provider if you:  Have pain in your abdomen.  Have bloating.  Have cramps.  Have not had a bowel movement in 3 days. Get help right away if:  Your pain gets worse.  Your bloating becomes very bad.  You have a fever or chills, and your symptoms suddenly get worse.  You vomit.  You have bowel movements that are bloody or black.  You have bleeding from your rectum. Summary  Diverticulosis is a condition that develops when small pouches (diverticula) form in the wall of the large intestine (colon).  You may have a few pouches or many of them.  This condition is most often diagnosed during an exam for other colon problems.  Treatment may include increasing the fiber in your diet, taking supplements, or taking medicines. This information is not intended to replace advice given to you by your health care provider. Make sure you discuss any questions you have with your health care provider. Document Revised: 05/16/2019 Document Reviewed: 05/16/2019 Elsevier Patient Education  [place].  High-Fiber Diet Fiber, also called dietary fiber, is a type of carbohydrate that is found in fruits, vegetables, whole grains, and beans. A high-fiber diet can have many health benefits. Your health care provider may recommend a  high-fiber diet to help:  Prevent constipation. Fiber can make your bowel movements more regular.  Lower your cholesterol.  Relieve the following conditions: ? Swelling of veins in the anus (hemorrhoids). ? Swelling and irritation (inflammation) of specific areas of the digestive tract (uncomplicated diverticulosis). ? A problem of the large intestine (colon) that sometimes causes pain and diarrhea (irritable bowel syndrome, IBS).  Prevent overeating as part of a weight-loss plan.  Prevent heart disease, type 2 diabetes, and certain cancers. What is my plan? The recommended  daily fiber intake in grams (g) includes:  38 g for men age 25 or younger.  30 g for men over age 54.  51 g for women age 31 or younger.  21 g for women over age 76. You can get the recommended daily intake of dietary fiber by:  Eating a variety of fruits, vegetables, grains, and beans.  Taking a fiber supplement, if it is not possible to get enough fiber through your diet. What do I need to know about a high-fiber diet?  It is better to get fiber through food sources rather than from fiber supplements. There is not a lot of research about how effective supplements are.  Always check the fiber content on the nutrition facts label of any prepackaged food. Look for foods that contain 5 g of fiber or more per serving.  Talk with a diet and nutrition specialist (dietitian) if you have questions about specific foods that are recommended or not recommended for your medical condition, especially if those foods are not listed below.  Gradually increase how much fiber you consume. If you increase your intake of dietary fiber too quickly, you may have bloating, cramping, or gas.  Drink plenty of water. Water helps you to digest fiber. What are tips for following this plan?  Eat a wide variety of high-fiber foods.  Make sure that half of the grains that you eat each day are whole grains.  Eat breads and cereals that are made with whole-grain flour instead of refined flour or white flour.  Eat brown rice, bulgur wheat, or millet instead of white rice.  Start the day with a breakfast that is high in fiber, such as a cereal that contains 5 g of fiber or more per serving.  Use beans in place of meat in soups, salads, and pasta dishes.  Eat high-fiber snacks, such as berries, raw vegetables, nuts, and popcorn.  Choose whole fruits and vegetables instead of processed forms like juice or sauce. What foods can I eat?  Fruits Berries. Pears. Apples. Oranges. Avocado. Prunes and raisins.  Dried figs. Vegetables Sweet potatoes. Spinach. Kale. Artichokes. Cabbage. Broccoli. Cauliflower. Green peas. Carrots. Squash. Grains Whole-grain breads. Multigrain cereal. Oats and oatmeal. Brown rice. Barley. Bulgur wheat. [place]. Quinoa. Bran muffins. Popcorn. Rye wafer crackers. Meats and other proteins [place], kidney, and pinto beans. Soybeans. Split peas. Lentils. Nuts and seeds. Dairy Fiber-fortified yogurt. Beverages Fiber-fortified soy milk. Fiber-fortified orange juice. Other foods Fiber bars. The items listed above may not be a complete list of recommended foods and beverages. Contact a dietitian for more options. What foods are not recommended? Fruits Fruit juice. Cooked, strained fruit. Vegetables Fried potatoes. Canned vegetables. Well-cooked vegetables. Grains White bread. Pasta made with refined flour. White rice. Meats and other proteins Fatty cuts of meat. Fried chicken or fried fish. Dairy Milk. Yogurt. Cream cheese. Sour cream. Fats and oils Butters. Beverages Soft drinks. Other foods Cakes and pastries. The items listed above may not be  a complete list of foods and beverages to avoid. Contact a dietitian for more information. Summary  Fiber is a type of carbohydrate. It is found in fruits, vegetables, whole grains, and beans.  There are many health benefits of eating a high-fiber diet, such as preventing constipation, lowering blood cholesterol, helping with weight loss, and reducing your risk of heart disease, diabetes, and certain cancers.  Gradually increase your intake of fiber. Increasing too fast can result in cramping, bloating, and gas. Drink plenty of water while you increase your fiber.  The best sources of fiber include whole fruits and vegetables, whole grains, nuts, seeds, and beans. This information is not intended to replace advice given to you by your health care provider. Make sure you discuss any questions you have with your health care  provider. Document Revised: 08/21/2017 Document Reviewed: 08/21/2017 Elsevier Patient Education  [place].  Colonoscopy, Adult, Care After This sheet gives you information about how to care for yourself after your procedure. Your doctor may also give you more specific instructions. If you have problems or questions, call your doctor. What can I expect after the procedure? After the procedure, it is common to have:  A small amount of blood in your poop (stool) for 24 hours.  Some gas.  Mild cramping or bloating in your belly (abdomen). Follow these instructions at home: Eating and drinking   Drink enough fluid to keep your pee (urine) pale yellow.  Follow instructions from your doctor about what you cannot eat or drink.  Return to your normal diet as told by your doctor. Avoid heavy or fried foods that are hard to digest. Activity  Rest as told by your doctor.  Do not sit for a long time without moving. Get up to take short walks every 1-2 hours. This is important. Ask for help if you feel weak or unsteady.  Return to your normal activities as told by your doctor. Ask your doctor what activities are safe for you. To help cramping and bloating:   Try walking around.  Put heat on your belly as told by your doctor. Use the heat source that your doctor recommends, such as a moist heat pack or a heating pad. ? Put a towel between your skin and the heat source. ? Leave the heat on for 20-30 minutes. ? Remove the heat if your skin turns bright red. This is very important if you are unable to feel pain, heat, or cold. You may have a greater risk of getting burned. General instructions  For the first 24 hours after the procedure: ? Do not drive or use machinery. ? Do not sign important documents. ? Do not drink alcohol. ? Do your daily activities more slowly than normal. ? Eat foods that are soft and easy to digest.  Take over-the-counter or prescription medicines only  as told by your doctor.  Keep all follow-up visits as told by your doctor. This is important. Contact a doctor if:  You have blood in your poop 2-3 days after the procedure. Get help right away if:  You have more than a small amount of blood in your poop.  You see large clumps of tissue (blood clots) in your poop.  Your belly is swollen.  You feel like you may vomit (nauseous).  You vomit.  You have a fever.  You have belly pain that gets worse, and medicine does not help your pain. Summary  After the procedure, it is common to have a  small amount of blood in your poop. You may also have mild cramping and bloating in your belly.  For the first 24 hours after the procedure, do not drive or use machinery, do not sign important documents, and do not drink alcohol.  Get help right away if you have a lot of blood in your poop, feel like you may vomit, have a fever, or have more belly pain. This information is not intended to replace advice given to you by your health care provider. Make sure you discuss any questions you have with your health care provider. Document Revised: 05/13/2019 Document Reviewed: 05/13/2019 Elsevier Patient Education  [place].

## 2020-04-11 DIAGNOSIS — J302 Other seasonal allergic rhinitis: Secondary | ICD-10-CM | POA: Insufficient documentation

## 2020-04-11 DIAGNOSIS — M549 Dorsalgia, unspecified: Secondary | ICD-10-CM | POA: Insufficient documentation

## 2020-04-13 LAB — SURGICAL PATHOLOGY

## 2020-04-14 ENCOUNTER — Encounter (HOSPITAL_COMMUNITY): Payer: Self-pay | Admitting: Internal Medicine

## 2020-09-17 ENCOUNTER — Other Ambulatory Visit: Payer: Self-pay | Admitting: Family Medicine

## 2020-09-17 DIAGNOSIS — I10 Essential (primary) hypertension: Secondary | ICD-10-CM

## 2020-09-29 ENCOUNTER — Other Ambulatory Visit: Payer: Self-pay | Admitting: Family Medicine

## 2020-12-08 ENCOUNTER — Encounter: Payer: Self-pay | Admitting: General Surgery

## 2020-12-08 ENCOUNTER — Other Ambulatory Visit: Payer: Self-pay

## 2020-12-08 ENCOUNTER — Ambulatory Visit (INDEPENDENT_AMBULATORY_CARE_PROVIDER_SITE_OTHER): Payer: PPO | Admitting: General Surgery

## 2020-12-08 VITALS — BP 115/76 | HR 92 | Temp 97.4°F | Resp 16 | Ht 70.5 in | Wt 175.0 lb

## 2020-12-08 DIAGNOSIS — K4090 Unilateral inguinal hernia, without obstruction or gangrene, not specified as recurrent: Secondary | ICD-10-CM | POA: Diagnosis not present

## 2020-12-08 NOTE — Patient Instructions (Signed)
Inguinal Hernia, Adult An inguinal hernia develops when fat or the intestines push through a weak spot in a muscle where the leg meets the lower abdomen (groin). This creates a bulge. This kind of hernia could also be:  In the scrotum, if you are male.  In folds of skin around the vagina, if you are male. There are three types of inguinal hernias:  Hernias that can be pushed back into the abdomen (are reducible). This type rarely causes pain.  Hernias that are not reducible (are incarcerated).  Hernias that are not reducible and lose their blood supply (are strangulated). This type of hernia requires emergency surgery. What are the causes? This condition is caused by having a weak spot in the muscles or tissues in your groin. This develops over time. The hernia may poke through the weak spot when you suddenly strain your lower abdominal muscles, such as when you:  Lift a heavy object.  Strain to have a bowel movement. Constipation can lead to straining.  Cough. What increases the risk? This condition is more likely to develop in:  Males.  Pregnant females.  People who: ? Are overweight. ? Work in jobs that require long periods of standing or heavy lifting. ? Have had an inguinal hernia before. ? Smoke or have lung disease. These factors can lead to long-term (chronic) coughing. What are the signs or symptoms? Symptoms may depend on the size of the hernia. Often, a small inguinal hernia has no symptoms. Symptoms of a larger hernia may include:  A bulge in the groin area. This is easier to see when standing. It might not be visible when lying down.  Pain or burning in the groin. This may get worse when lifting, straining, or coughing.  A dull ache or a feeling of pressure in the groin.  An unusual bulge in the scrotum, in males. Symptoms of a strangulated inguinal hernia may include:  A bulge in your groin that is very painful and tender to the touch.  A bulge that  turns red or purple.  Fever, nausea, and vomiting.  Inability to have a bowel movement or to pass gas. How is this diagnosed? This condition is diagnosed based on your symptoms, your medical history, and a physical exam. Your health care provider may feel your groin area and ask you to cough. How is this treated? Treatment depends on the size of your hernia and whether you have symptoms. If you do not have symptoms, your health care provider may have you watch your hernia carefully and have you come in for follow-up visits. If your hernia is large or if you have symptoms, you may need surgery to repair the hernia. Follow these instructions at home: Lifestyle  Avoid lifting heavy objects.  Avoid standing for long periods of time.  Do not use any products that contain nicotine or tobacco. These products include cigarettes, chewing tobacco, and vaping devices, such as e-cigarettes. If you need help quitting, ask your health care provider.  Maintain a healthy weight. Preventing constipation You may need to take these actions to prevent or treat constipation:  Drink enough fluid to keep your urine pale yellow.  Take over-the-counter or prescription medicines.  Eat foods that are high in fiber, such as beans, whole grains, and fresh fruits and vegetables.  Limit foods that are high in fat and processed sugars, such as fried or sweet foods. General instructions  You may try to push the hernia back in place by very gently   pressing on it while lying down. Do not try to force the bulge back in if it will not push in easily.  Watch your hernia for any changes in shape, size, or color. Get help right away if you notice any changes.  Take over-the-counter and prescription medicines only as told by your health care provider.  Keep all follow-up visits. This is important. Contact a health care provider if:  You have a fever or chills.  You develop new symptoms.  Your symptoms get  worse. Get help right away if:  You have pain in your groin that suddenly gets worse.  You have a bulge in your groin that: ? Suddenly gets bigger and does not get smaller. ? Becomes red or purple or painful to the touch.  You are a man and you have a sudden pain in your scrotum, or the size of your scrotum suddenly changes.  You cannot push the hernia back in place by very gently pressing on it when you are lying down.  You have nausea or vomiting that does not go away.  You have a fast heartbeat.  You cannot have a bowel movement or pass gas. These symptoms may represent a serious problem that is an emergency. Do not wait to see if the symptoms will go away. Get medical help right away. Call your local emergency services (911 in the [place] Summary  An inguinal hernia develops when fat or the intestines push through a weak spot in a muscle where your leg meets your lower abdomen (groin).  This condition is caused by having a weak spot in muscles or tissues in your groin.  Symptoms may depend on the size of the hernia, and they may include pain or swelling in your groin. A small inguinal hernia often has no symptoms.  Treatment may not be needed if you do not have symptoms. If you have symptoms or a large hernia, you may need surgery to repair the hernia.  Avoid lifting heavy objects. Also, avoid standing for long periods of time. This information is not intended to replace advice given to you by your health care provider. Make sure you discuss any questions you have with your health care provider. Document Revised: 06/16/2020 Document Reviewed: 06/16/2020 Elsevier Patient Education  2021 [employer]  

## 2020-12-08 NOTE — Progress Notes (Signed)
[name] Ham.; [id number]; 04/08/1956   HPI Patient is a 65 year old black male who presented to the office with worsening right groin pain.  He states is been present for the last 4 to 6 weeks.  He does not remember any significant incident that caused the swelling.  There is some occasional pain that radiates to his right testicle when he is straining.  The lump resolves when lying down.  He denies any nausea or vomiting. Past Medical History:  Diagnosis Date  . Abnormal EKG   . Apical variant hypertrophic cardiomyopathy ([place])   . Benign essential hypertension   . BPH (benign prostatic hyperplasia)   . Hypokalemia   . Tobacco abuse     Past Surgical History:  Procedure Laterality Date  . CARDIAC CATHETERIZATION     2008  . COLONOSCOPY N/A 02/26/2015   Procedure: COLONOSCOPY;  Surgeon: Rogene Houston, MD;  [facility];  Service: Endoscopy;  Laterality: N/A;  830  . COLONOSCOPY WITH PROPOFOL N/A 04/10/2020   Procedure: COLONOSCOPY WITH PROPOFOL;  Surgeon: Rogene Houston, MD;  [facility];  Service: Endoscopy;  Laterality: N/A;  830  . KIDNEY SURGERY Right April 2015   benign tumor removal  . POLYPECTOMY  04/10/2020   Procedure: POLYPECTOMY;  Surgeon: Rogene Houston, MD;  [facility];  Service: Endoscopy;;  proximal transverse colon(cs); transverse colon polyp (cbiopsy);sigmoid colon polyp(cb);  . PROSTATE ABLATION  12-2015 at [facility]  . SHOULDER ACROMIOPLASTY Left 01/18/2016   Procedure: SHOULDER ACROMIOPLASTY;  Surgeon: Milly Jakob, MD;  Location: [place];  Service: Orthopedics;  Laterality: Left;  . SHOULDER ARTHROSCOPY WITH ROTATOR CUFF REPAIR AND SUBACROMIAL DECOMPRESSION Left 01/18/2016   Procedure: LEFT SHOULDER ARTHROSCOPY WITH ROTATOR CUFF REPAIR AND SUBACROMIAL DECOMPRESSION;  Surgeon: Milly Jakob, MD;  Location: [place];  Service: Orthopedics;  Laterality: Left;  PRE-OP BLOCK WITH GENERAL ANESTHESIA     Family History  Problem Relation Age of Onset  . Diabetes Mother   . Diabetes Other   . Hypertension Other     Current Outpatient Medications on File Prior to Visit  Medication Sig Dispense Refill  . aspirin EC 81 MG tablet Take 1 tablet (81 mg total) by mouth daily. 30 tablet 11  . famotidine (PEPCID) 20 MG tablet Take by mouth.    . gabapentin (NEURONTIN) 300 MG capsule TAKE ONE CAPSULE BY MOUTH DAILY FOR NEUROPATHIC PAIN    . hydrochlorothiazide (HYDRODIURIL) 25 MG tablet Take 1 tablet (25 mg total) by mouth daily. 30 tablet 0  . LIVALO 2 MG TABS Take 1 tablet by mouth daily.    . metoprolol tartrate (LOPRESSOR) 25 MG tablet TAKE ONE TABLET BY MOUTH TWICE DAILY. (Patient taking differently: Take 25 mg by mouth 2 (two) times daily.) 180 tablet 0  . Multiple Vitamins-Minerals (MULTIVITAMIN PO) Take 1 tablet by mouth daily.    . nitroGLYCERIN (NITROSTAT) 0.4 MG SL tablet Place 0.4 mg under the tongue every 5 (five) minutes as needed for chest pain.     . Omega-3 Fatty Acids (FISH OIL) 1000 MG CAPS Take 1,000 mg by mouth daily.    . potassium chloride SA (KLOR-CON) 20 MEQ tablet TAKE ONE TABLET BY MOUTH DAILY. (Patient taking differently: Take 20 mEq by mouth daily.) 90 tablet 1  . sildenafil (VIAGRA) 100 MG tablet Take 1 tablet by mouth daily as needed.     No current facility-administered medications on file prior to visit.  No Known Allergies  Social History   Substance and Sexual Activity  Alcohol Use No    Social History   Tobacco Use  Smoking Status Current Every Day Smoker  . Packs/day: 1.00  . Years: 28.00  . Pack years: 28.00  . Types: Cigarettes  Smokeless Tobacco Never Used  Tobacco Comment   pack will last 3 days     Review of Systems  Constitutional: Negative.   HENT: Negative.   Eyes: Negative.   Respiratory: Negative.   Cardiovascular: Negative.   Gastrointestinal: Positive for abdominal pain and heartburn.  Genitourinary: Negative.    Musculoskeletal: Negative.   Skin: Negative.   Neurological: Negative.   Endo/Heme/Allergies: Negative.   Psychiatric/Behavioral: Negative.     Objective   Vitals:   12/08/20 1318  BP: 115/76  Pulse: 92  Resp: 16  Temp: (!) 97.4 F (36.3 C)  SpO2: 96%    Physical Exam Vitals reviewed.  Constitutional:      Appearance: Normal appearance. He is normal weight. He is not ill-appearing.  HENT:     Head: Normocephalic and atraumatic.  Cardiovascular:     Rate and Rhythm: Normal rate and regular rhythm.     Heart sounds: Normal heart sounds. No murmur heard. No friction rub. No gallop.   Pulmonary:     Effort: Pulmonary effort is normal. No respiratory distress.     Breath sounds: Normal breath sounds. No stridor. No wheezing, rhonchi or rales.  Abdominal:     General: Bowel sounds are normal. There is no distension.     Palpations: Abdomen is soft. There is no mass.     Tenderness: There is no abdominal tenderness. There is no guarding or rebound.     Hernia: A hernia is present.     Comments: Reducible right inguinal hernia.  Lax left inguinal floor.  Genitourinary:    Testes: Normal.  Skin:    General: Skin is warm and dry.  Neurological:     Mental Status: He is alert and oriented to person, place, and time.     Assessment  Right inguinal hernia Plan   Patient wants to delay surgical repair until later this spring.  That is fine with me.  He will call to schedule.  The risks and benefits of the procedure including bleeding, infection, mesh use, and the possibility of recurrence of the hernia were fully explained to the patient, who gave informed consent.

## 2020-12-29 NOTE — H&P (Signed)
[name] Foster.; [id number]; 1956-05-28   HPI Patient is a 65 year old black male who presented to the office with worsening right groin pain.  He states is been present for the last 4 to 6 weeks.  He does not remember any significant incident that caused the swelling.  There is some occasional pain that radiates to his right testicle when he is straining.  The lump resolves when lying down.  He denies any nausea or vomiting. Past Medical History:  Diagnosis Date  . Abnormal EKG   . Apical variant hypertrophic cardiomyopathy ([place])   . Benign essential hypertension   . BPH (benign prostatic hyperplasia)   . Hypokalemia   . Tobacco abuse     Past Surgical History:  Procedure Laterality Date  . CARDIAC CATHETERIZATION     2008  . COLONOSCOPY N/A 02/26/2015   Procedure: COLONOSCOPY;  Surgeon: Rogene Houston, MD;  [facility];  Service: Endoscopy;  Laterality: N/A;  830  . COLONOSCOPY WITH PROPOFOL N/A 04/10/2020   Procedure: COLONOSCOPY WITH PROPOFOL;  Surgeon: Rogene Houston, MD;  [facility];  Service: Endoscopy;  Laterality: N/A;  830  . KIDNEY SURGERY Right April 2015   benign tumor removal  . POLYPECTOMY  04/10/2020   Procedure: POLYPECTOMY;  Surgeon: Rogene Houston, MD;  [facility];  Service: Endoscopy;;  proximal transverse colon(cs); transverse colon polyp (cbiopsy);sigmoid colon polyp(cb);  . PROSTATE ABLATION  12-2015 at [facility]  . SHOULDER ACROMIOPLASTY Left 01/18/2016   Procedure: SHOULDER ACROMIOPLASTY;  Surgeon: Milly Jakob, MD;  Location: [place];  Service: Orthopedics;  Laterality: Left;  . SHOULDER ARTHROSCOPY WITH ROTATOR CUFF REPAIR AND SUBACROMIAL DECOMPRESSION Left 01/18/2016   Procedure: LEFT SHOULDER ARTHROSCOPY WITH ROTATOR CUFF REPAIR AND SUBACROMIAL DECOMPRESSION;  Surgeon: Milly Jakob, MD;  Location: [place];  Service: Orthopedics;  Laterality: Left;  PRE-OP BLOCK WITH GENERAL ANESTHESIA     Family History  Problem Relation Age of Onset  . Diabetes Mother   . Diabetes Other   . Hypertension Other     Current Outpatient Medications on File Prior to Visit  Medication Sig Dispense Refill  . aspirin EC 81 MG tablet Take 1 tablet (81 mg total) by mouth daily. 30 tablet 11  . famotidine (PEPCID) 20 MG tablet Take by mouth.    . gabapentin (NEURONTIN) 300 MG capsule TAKE ONE CAPSULE BY MOUTH DAILY FOR NEUROPATHIC PAIN    . hydrochlorothiazide (HYDRODIURIL) 25 MG tablet Take 1 tablet (25 mg total) by mouth daily. 30 tablet 0  . LIVALO 2 MG TABS Take 1 tablet by mouth daily.    . metoprolol tartrate (LOPRESSOR) 25 MG tablet TAKE ONE TABLET BY MOUTH TWICE DAILY. (Patient taking differently: Take 25 mg by mouth 2 (two) times daily.) 180 tablet 0  . Multiple Vitamins-Minerals (MULTIVITAMIN PO) Take 1 tablet by mouth daily.    . nitroGLYCERIN (NITROSTAT) 0.4 MG SL tablet Place 0.4 mg under the tongue every 5 (five) minutes as needed for chest pain.     . Omega-3 Fatty Acids (FISH OIL) 1000 MG CAPS Take 1,000 mg by mouth daily.    . potassium chloride SA (KLOR-CON) 20 MEQ tablet TAKE ONE TABLET BY MOUTH DAILY. (Patient taking differently: Take 20 mEq by mouth daily.) 90 tablet 1  . sildenafil (VIAGRA) 100 MG tablet Take 1 tablet by mouth daily as needed.     No current facility-administered medications on file prior to visit.  No Known Allergies  Social History   Substance and Sexual Activity  Alcohol Use No    Social History   Tobacco Use  Smoking Status Current Every Day Smoker  . Packs/day: 1.00  . Years: 28.00  . Pack years: 28.00  . Types: Cigarettes  Smokeless Tobacco Never Used  Tobacco Comment   pack will last 3 days     Review of Systems  Constitutional: Negative.   HENT: Negative.   Eyes: Negative.   Respiratory: Negative.   Cardiovascular: Negative.   Gastrointestinal: Positive for abdominal pain and heartburn.  Genitourinary: Negative.    Musculoskeletal: Negative.   Skin: Negative.   Neurological: Negative.   Endo/Heme/Allergies: Negative.   Psychiatric/Behavioral: Negative.     Objective   Vitals:   12/08/20 1318  BP: 115/76  Pulse: 92  Resp: 16  Temp: (!) 97.4 F (36.3 C)  SpO2: 96%    Physical Exam Vitals reviewed.  Constitutional:      Appearance: Normal appearance. He is normal weight. He is not ill-appearing.  HENT:     Head: Normocephalic and atraumatic.  Cardiovascular:     Rate and Rhythm: Normal rate and regular rhythm.     Heart sounds: Normal heart sounds. No murmur heard. No friction rub. No gallop.   Pulmonary:     Effort: Pulmonary effort is normal. No respiratory distress.     Breath sounds: Normal breath sounds. No stridor. No wheezing, rhonchi or rales.  Abdominal:     General: Bowel sounds are normal. There is no distension.     Palpations: Abdomen is soft. There is no mass.     Tenderness: There is no abdominal tenderness. There is no guarding or rebound.     Hernia: A hernia is present.     Comments: Reducible right inguinal hernia.  Lax left inguinal floor.  Genitourinary:    Testes: Normal.  Skin:    General: Skin is warm and dry.  Neurological:     Mental Status: He is alert and oriented to person, place, and time.     Assessment  Right inguinal hernia Plan   Patient wants to delay surgical repair until later this spring.  That is fine with me.  He will call to schedule.  The risks and benefits of the procedure including bleeding, infection, mesh use, and the possibility of recurrence of the hernia were fully explained to the patient, who gave informed consent.

## 2021-01-06 NOTE — Patient Instructions (Signed)
[name] Foster.  01/06/2021     @[email or web address] @   Your procedure is scheduled on  01/11/2021   Report to [facility] at  [email or web address].   Call this number if you have problems the morning of surgery:  [phone]   Remember:  Do not eat or drink after midnight.                         Take these medicines the morning of surgery with A SIP OF WATER  Gabapentin, metoprolol.  Place clean sheets on your bed the night before your procedure and DO NOT sleep with pets this night.  Shower with CHG the night before and the morning of your procedure. DO NOT put CHG on your face, hair or genitals.  After each shower, dry off with a clean towel put on clean, comfortable clothes and brush your teeth.      Do not wear jewelry, make-up or nail polish.  Do not wear lotions, powders, or perfumes, or deodorant.  Do not shave 48 hours prior to surgery.  Men may shave face and neck.  Do not bring valuables to the hospital.  [facility] is not responsible for any belongings or valuables.   Contacts, dentures or bridgework may not be worn into surgery.  Leave your suitcase in the car.  After surgery it may be brought to your room.  For patients admitted to the hospital, discharge time will be determined by your treatment team.  Patients discharged the day of surgery will not be allowed to drive home and must have someone with them for 24 hours.   Special instructions:   DO NOT smoke tobacco or vape the morning of your procedure.  Please read over the following fact sheets that you were given. Coughing and Deep Breathing, Surgical Site Infection Prevention, Anesthesia Post-op Instructions and Care and Recovery After Surgery       Open Hernia Repair, Adult, Care After What can I expect after the procedure? After the procedure, it is common to have:  Mild discomfort.  Slight bruising.  Mild swelling.  Pain in the belly (abdomen).  A small amount of blood from the cut from  surgery (incision). Follow these instructions at home: Your doctor may give you more specific instructions. If you have problems, call your doctor. Medicines  Take over-the-counter and prescription medicines only as told by your doctor.  If told, take steps to prevent problems with pooping (constipation). You may need to: ? Drink enough fluid to keep your pee (urine) pale yellow. ? Take medicines. You will be told what medicines to take. ? Eat foods that are high in fiber. These include beans, whole grains, and fresh fruits and vegetables. ? Limit foods that are high in fat and sugar. These include fried or sweet foods.  Ask your doctor if you should avoid driving or using machines while you are taking your medicine. Incision care  Follow instructions from your doctor about how to take care of your incision. Make sure you: ? Wash your hands with soap and water for at least 20 seconds before and after you change your bandage (dressing). If you cannot use soap and water, use hand sanitizer. ? Change your bandage. ? Leave stitches or skin glue in place for at least 2 weeks. ? Leave tape strips alone unless you are told to take them off. You may trim the edges of the tape  strips if they curl up.  Check your incision every day for signs of infection. Check for: ? More redness, swelling, or pain. ? More fluid or blood. ? Warmth. ? Pus or a bad smell.  Wear loose, soft clothing while your incision heals.   Activity  Rest as told by your doctor.  Do not lift anything that is heavier than 10 lb (4.5 kg), or the limit that you are told.  Do not play contact sports until your doctor says that this is safe.  If you were given a sedative during your procedure, do not drive or use machines until your doctor says that it is safe. A sedative is a medicine that helps you relax.  Return to your normal activities when your doctor says that it is safe.   General instructions  Do not take baths,  swim, or use a hot tub. Ask your doctor about taking showers or sponge baths.  Hold a pillow over your belly when you cough or sneeze. This helps with pain.  Do not smoke or use any products that contain nicotine or tobacco. If you need help quitting, ask your doctor.  Keep all follow-up visits. Contact a doctor if:  You have any of these signs of infection in or around your incision: ? More redness, swelling, or pain. ? More fluid or blood. ? Warmth. ? Pus. ? A bad smell.  You have a fever or chills.  You have blood in your poop (stool).  You have not pooped (had a bowel movement) in 2-3 days.  Medicine does not help your pain. Get help right away if:  You have chest pain, or you are short of breath.  You feel faint or light-headed.  You have very bad pain.  You vomit and your pain is worse.  You have pain, swelling, or redness in a leg. These symptoms may be an emergency. Get help right away. Call your local emergency services (911 in the [place]  Do not wait to see if the symptoms will go away.  Do not drive yourself to the hospital. Summary  After this procedure, it is common to have mild discomfort, slight bruising, and mild swelling.  Follow instructions from your doctor about how to take care of your cut from surgery (incision). Check every day for signs of infection.  Do not lift heavy objects or play contact sports until your doctor says it is safe.  Return to your normal activities as told by your doctor. This information is not intended to replace advice given to you by your health care provider. Make sure you discuss any questions you have with your health care provider. Document Revised: 06/01/2020 Document Reviewed: 06/01/2020 Elsevier Patient Education  2021 [place] Anesthesia, Adult, Care After This sheet gives you information about how to care for yourself after your procedure. Your health care provider may also give you more specific  instructions. If you have problems or questions, contact your health care provider. What can I expect after the procedure? After the procedure, the following side effects are common:  Pain or discomfort at the IV site.  Nausea.  Vomiting.  Sore throat.  Trouble concentrating.  Feeling cold or chills.  Feeling weak or tired.  Sleepiness and fatigue.  Soreness and body aches. These side effects can affect parts of the body that were not involved in surgery. Follow these instructions at home: For the time period you were told by your health care provider:  Rest.  Do not participate in activities where you could fall or become injured.  Do not drive or use machinery.  Do not drink alcohol.  Do not take sleeping pills or medicines that cause drowsiness.  Do not make important decisions or sign legal documents.  Do not take care of children on your own.   Eating and drinking  Follow any instructions from your health care provider about eating or drinking restrictions.  When you feel hungry, start by eating small amounts of foods that are soft and easy to digest (bland), such as toast. Gradually return to your regular diet.  Drink enough fluid to keep your urine pale yellow.  If you vomit, rehydrate by drinking water, juice, or clear broth. General instructions  If you have sleep apnea, surgery and certain medicines can increase your risk for breathing problems. Follow instructions from your health care provider about wearing your sleep device: ? Anytime you are sleeping, including during daytime naps. ? While taking prescription pain medicines, sleeping medicines, or medicines that make you drowsy.  Have a responsible adult stay with you for the time you are told. It is important to have someone help care for you until you are awake and alert.  Return to your normal activities as told by your health care provider. Ask your health care provider what activities are safe  for you.  Take over-the-counter and prescription medicines only as told by your health care provider.  If you smoke, do not smoke without supervision.  Keep all follow-up visits as told by your health care provider. This is important. Contact a health care provider if:  You have nausea or vomiting that does not get better with medicine.  You cannot eat or drink without vomiting.  You have pain that does not get better with medicine.  You are unable to pass urine.  You develop a skin rash.  You have a fever.  You have redness around your IV site that gets worse. Get help right away if:  You have difficulty breathing.  You have chest pain.  You have blood in your urine or stool, or you vomit blood. Summary  After the procedure, it is common to have a sore throat or nausea. It is also common to feel tired.  Have a responsible adult stay with you for the time you are told. It is important to have someone help care for you until you are awake and alert.  When you feel hungry, start by eating small amounts of foods that are soft and easy to digest (bland), such as toast. Gradually return to your regular diet.  Drink enough fluid to keep your urine pale yellow.  Return to your normal activities as told by your health care provider. Ask your health care provider what activities are safe for you. This information is not intended to replace advice given to you by your health care provider. Make sure you discuss any questions you have with your health care provider. Document Revised: 07/02/2020 Document Reviewed: 01/30/2020 Elsevier Patient Education  2021 [employer].

## 2021-01-07 ENCOUNTER — Other Ambulatory Visit (HOSPITAL_COMMUNITY): Payer: BC Managed Care – PPO

## 2021-01-07 ENCOUNTER — Encounter (HOSPITAL_COMMUNITY)
Admission: RE | Admit: 2021-01-07 | Discharge: 2021-01-07 | Disposition: A | Discharge status: Home / Self Care | Payer: BC Managed Care – PPO | Source: Ambulatory Visit | Attending: General Surgery | Admitting: General Surgery

## 2021-01-08 NOTE — Patient Instructions (Signed)
Your procedure is scheduled on: 01/13/2021  Report to [facility] at   10:30  AM.  Call this number if you have problems the morning of surgery: [phone]   Remember:   Do not Eat or Drink after midnight         No Smoking the morning of surgery  :  Take these medicines the morning of surgery with A SIP OF WATER: metoprolol and Gabapentin   Do not wear jewelry, make-up or nail polish.  Do not wear lotions, powders, or perfumes. You may wear deodorant.  Do not shave 48 hours prior to surgery. Men may shave face and neck.  Do not bring valuables to the hospital.  Contacts, dentures or bridgework may not be worn into surgery.  Leave suitcase in the car. After surgery it may be brought to your room.  For patients admitted to the hospital, checkout time is 11:00 AM the day of discharge.   Patients discharged the day of surgery will not be allowed to drive home.    Special Instructions: Shower using CHG night before surgery and shower the day of surgery use CHG.  Use special wash - you have one bottle of CHG for all showers.  You should use approximately 1/2 of the bottle for each shower.  How to Use Chlorhexidine for Bathing Chlorhexidine gluconate (CHG) is a germ-killing (antiseptic) solution that is used to clean the skin. It can get rid of the bacteria that normally live on the skin and can keep them away for about 24 hours. To clean your skin with CHG, you may be given:  A CHG solution to use in the shower or as part of a sponge bath.  A prepackaged cloth that contains CHG. Cleaning your skin with CHG may help lower the risk for infection:  While you are staying in the intensive care unit of the hospital.  If you have a vascular access, such as a central line, to provide short-term or long-term access to your veins.  If you have a catheter to drain urine from your bladder.  If you are on a ventilator. A ventilator is a machine that helps you breathe by moving air in and out  of your lungs.  After surgery. What are the risks? Risks of using CHG include:  A skin reaction.  Hearing loss, if CHG gets in your ears.  Eye injury, if CHG gets in your eyes and is not rinsed out.  The CHG product catching fire. Make sure that you avoid smoking and flames after applying CHG to your skin. Do not use CHG:  If you have a chlorhexidine allergy or have previously reacted to chlorhexidine.  On babies younger than 64 months of age. How to use CHG solution  Use CHG only as told by your health care provider, and follow the instructions on the label.  Use the full amount of CHG as directed. Usually, this is one bottle. During a shower Follow these steps when using CHG solution during a shower (unless your health care provider gives you different instructions): 1. Start the shower. 2. Use your normal soap and shampoo to wash your face and hair. 3. Turn off the shower or move out of the shower stream. 4. Pour the CHG onto a clean washcloth. Do not use any type of brush or rough-edged sponge. 5. Starting at your neck, lather your body down to your toes. Make sure you follow these instructions: ? If you will be having surgery, pay  special attention to the part of your body where you will be having surgery. Scrub this area for at least 1 minute. ? Do not use CHG on your head or face. If the solution gets into your ears or eyes, rinse them well with water. ? Avoid your genital area. ? Avoid any areas of skin that have broken skin, cuts, or scrapes. ? Scrub your back and under your arms. Make sure to wash skin folds. 6. Let the lather sit on your skin for 1-2 minutes or as long as told by your health care provider. 7. Thoroughly rinse your entire body in the shower. Make sure that all body creases and crevices are rinsed well. 8. Dry off with a clean towel. Do not put any substances on your body afterward--such as powder, lotion, or perfume--unless you are told to do so by your  health care provider. Only use lotions that are recommended by the manufacturer. 9. Put on clean clothes or pajamas. 10. If it is the night before your surgery, sleep in clean sheets.   During a sponge bath Follow these steps when using CHG solution during a sponge bath (unless your health care provider gives you different instructions): 1. Use your normal soap and shampoo to wash your face and hair. 2. Pour the CHG onto a clean washcloth. 3. Starting at your neck, lather your body down to your toes. Make sure you follow these instructions: ? If you will be having surgery, pay special attention to the part of your body where you will be having surgery. Scrub this area for at least 1 minute. ? Do not use CHG on your head or face. If the solution gets into your ears or eyes, rinse them well with water. ? Avoid your genital area. ? Avoid any areas of skin that have broken skin, cuts, or scrapes. ? Scrub your back and under your arms. Make sure to wash skin folds. 4. Let the lather sit on your skin for 1-2 minutes or as long as told by your health care provider. 5. Using a different clean, wet washcloth, thoroughly rinse your entire body. Make sure that all body creases and crevices are rinsed well. 6. Dry off with a clean towel. Do not put any substances on your body afterward--such as powder, lotion, or perfume--unless you are told to do so by your health care provider. Only use lotions that are recommended by the manufacturer. 7. Put on clean clothes or pajamas. 8. If it is the night before your surgery, sleep in clean sheets. How to use CHG prepackaged cloths  Only use CHG cloths as told by your health care provider, and follow the instructions on the label.  Use the CHG cloth on clean, dry skin.  Do not use the CHG cloth on your head or face unless your health care provider tells you to.  When washing with the CHG cloth: ? Avoid your genital area. ? Avoid any areas of skin that have  broken skin, cuts, or scrapes. Before surgery Follow these steps when using a CHG cloth to clean before surgery (unless your health care provider gives you different instructions): 1. Using the CHG cloth, vigorously scrub the part of your body where you will be having surgery. Scrub using a back-and-forth motion for 3 minutes. The area on your body should be completely wet with CHG when you are done scrubbing. 2. Do not rinse. Discard the cloth and let the area air-dry. Do not put any substances on  the area afterward, such as powder, lotion, or perfume. 3. Put on clean clothes or pajamas. 4. If it is the night before your surgery, sleep in clean sheets.   For general bathing Follow these steps when using CHG cloths for general bathing (unless your health care provider gives you different instructions). 1. Use a separate CHG cloth for each area of your body. Make sure you wash between any folds of skin and between your fingers and toes. Wash your body in the following order, switching to a new cloth after each step: ? The front of your neck, shoulders, and chest. ? Both of your arms, under your arms, and your hands. ? Your stomach and groin area, avoiding the genitals. ? Your right leg and foot. ? Your left leg and foot. ? The back of your neck, your back, and your buttocks. 2. Do not rinse. Discard the cloth and let the area air-dry. Do not put any substances on your body afterward--such as powder, lotion, or perfume--unless you are told to do so by your health care provider. Only use lotions that are recommended by the manufacturer. 3. Put on clean clothes or pajamas. Contact a health care provider if:  Your skin gets irritated after scrubbing.  You have questions about using your solution or cloth. Get help right away if:  Your eyes become very red or swollen.  Your eyes itch badly.  Your skin itches badly and is red or swollen.  Your hearing changes.  You have trouble  seeing.  You have swelling or tingling in your mouth or throat.  You have trouble breathing.  You swallow any chlorhexidine. Summary  Chlorhexidine gluconate (CHG) is a germ-killing (antiseptic) solution that is used to clean the skin. Cleaning your skin with CHG may help to lower your risk for infection.  You may be given CHG to use for bathing. It may be in a bottle or in a prepackaged cloth to use on your skin. Carefully follow your health care provider's instructions and the instructions on the product label.  Do not use CHG if you have a chlorhexidine allergy.  Contact your health care provider if your skin gets irritated after scrubbing. This information is not intended to replace advice given to you by your health care provider. Make sure you discuss any questions you have with your health care provider. Document Revised: 04/03/2020 Document Reviewed: 04/03/2020 Elsevier Patient Education  2021 [place].  Inguinal Hernia, Adult An inguinal hernia is when fat or your intestines push through a weak spot in a muscle where your leg meets your lower belly (groin). This causes a bulge. This kind of hernia could also be:  In your scrotum, if you are male.  In folds of skin around your vagina, if you are male. There are three types of inguinal hernias:  Hernias that can be pushed back into the belly (are reducible). This type rarely causes pain.  Hernias that cannot be pushed back into the belly (are incarcerated).  Hernias that cannot be pushed back into the belly and lose their blood supply (are strangulated). This type needs emergency surgery. What are the causes? This condition is caused by having a weak spot in the muscles or tissues in your groin. This develops over time. The hernia may poke through the weak spot when you strain your lower belly muscles all of a sudden, such as when you:  Lift a heavy object.  Strain to poop (have a bowel movement). Trouble pooping  (  constipation) can lead to straining.  Cough. What increases the risk? This condition is more likely to develop in:  Males.  Pregnant females.  People who: ? Are overweight. ? Work in jobs that require long periods of standing or heavy lifting. ? Have had an inguinal hernia before. ? Smoke or have lung disease. These factors can lead to long-term (chronic) coughing. What are the signs or symptoms? Symptoms may depend on the size of the hernia. Often, a small hernia has no symptoms. Symptoms of a larger hernia may include:  A bulge in the groin area. This is easier to see when standing. You might not be able to see it when you are lying down.  Pain or burning in the groin. This may get worse when you lift, strain, or cough.  A dull ache or a feeling of pressure in the groin.  An abnormal bulge in the scrotum, in males. Symptoms of a strangulated inguinal hernia may include:  A bulge in your groin that is very painful and tender to the touch.  A bulge that turns red or purple.  Fever, feeling like you may vomit (nausea), and vomiting.  Not being able to poop or to pass gas. How is this treated? Treatment depends on the size of your hernia and whether you have symptoms. If you do not have symptoms, your doctor may have you watch your hernia carefully and have you come in for follow-up visits. If your hernia is large or if you have symptoms, you may need surgery to repair the hernia. Follow these instructions at home: Lifestyle  Avoid lifting heavy objects.  Avoid standing for long amounts of time.  Do not smoke or use any products that contain nicotine or tobacco. If you need help quitting, ask your doctor.  Stay at a healthy weight. Prevent trouble pooping You may need to take these actions to prevent or treat trouble pooping:  Drink enough fluid to keep your pee (urine) pale yellow.  Take over-the-counter or prescription medicines.  Eat foods that are high in  fiber. These include beans, whole grains, and fresh fruits and vegetables.  Limit foods that are high in fat and sugar. These include fried or sweet foods. General instructions  You may try to push your hernia back in place by very gently pressing on it when you are lying down. Do not try to push the bulge back in if it will not go in easily.  Watch your hernia for any changes in shape, size, or color. Tell your doctor if you see any changes.  Take over-the-counter and prescription medicines only as told by your doctor.  Keep all follow-up visits. Contact a doctor if:  You have a fever or chills.  You have new symptoms.  Your symptoms get worse. Get help right away if:  You have pain in your groin that gets worse all of a sudden.  You have a bulge in your groin that: ? Gets bigger all of a sudden, and it does not get smaller after that. ? Turns red or purple. ? Is painful when you touch it.  You are a male, and you have: ? Sudden pain in your scrotum. ? A sudden change in the size of your scrotum.  You cannot push the hernia back in place by very gently pressing on it when you are lying down.  You feel like you may vomit, and that feeling does not go away.  You keep vomiting.  You have a  fast heartbeat.  You cannot poop or pass gas. These symptoms may be an emergency. Get help right away. Call your local emergency services (911 in the [place]  Do not wait to see if the symptoms will go away.  Do not drive yourself to the hospital. Summary  An inguinal hernia is when fat or your intestines push through a weak spot in a muscle where your leg meets your lower belly (groin). This causes a bulge.  If you do not have symptoms, you may not need treatment. If you have symptoms or a large hernia, you may need surgery.  Avoid lifting heavy objects. Also, avoid standing for long amounts of time.  Do not try to push the bulge back in if it will not go in easily. This  information is not intended to replace advice given to you by your health care provider. Make sure you discuss any questions you have with your health care provider. Document Revised: 06/16/2020 Document Reviewed: 06/16/2020 Elsevier Patient Education  2021 [place] Anesthesia, Adult, Care After This sheet gives you information about how to care for yourself after your procedure. Your health care provider may also give you more specific instructions. If you have problems or questions, contact your health care provider. What can I expect after the procedure? After the procedure, the following side effects are common:  Pain or discomfort at the IV site.  Nausea.  Vomiting.  Sore throat.  Trouble concentrating.  Feeling cold or chills.  Feeling weak or tired.  Sleepiness and fatigue.  Soreness and body aches. These side effects can affect parts of the body that were not involved in surgery. Follow these instructions at home: For the time period you were told by your health care provider:  Rest.  Do not participate in activities where you could fall or become injured.  Do not drive or use machinery.  Do not drink alcohol.  Do not take sleeping pills or medicines that cause drowsiness.  Do not make important decisions or sign legal documents.  Do not take care of children on your own.   Eating and drinking  Follow any instructions from your health care provider about eating or drinking restrictions.  When you feel hungry, start by eating small amounts of foods that are soft and easy to digest (bland), such as toast. Gradually return to your regular diet.  Drink enough fluid to keep your urine pale yellow.  If you vomit, rehydrate by drinking water, juice, or clear broth. General instructions  If you have sleep apnea, surgery and certain medicines can increase your risk for breathing problems. Follow instructions from your health care provider about  wearing your sleep device: ? Anytime you are sleeping, including during daytime naps. ? While taking prescription pain medicines, sleeping medicines, or medicines that make you drowsy.  Have a responsible adult stay with you for the time you are told. It is important to have someone help care for you until you are awake and alert.  Return to your normal activities as told by your health care provider. Ask your health care provider what activities are safe for you.  Take over-the-counter and prescription medicines only as told by your health care provider.  If you smoke, do not smoke without supervision.  Keep all follow-up visits as told by your health care provider. This is important. Contact a health care provider if:  You have nausea or vomiting that does not get better with medicine.  You cannot  eat or drink without vomiting.  You have pain that does not get better with medicine.  You are unable to pass urine.  You develop a skin rash.  You have a fever.  You have redness around your IV site that gets worse. Get help right away if:  You have difficulty breathing.  You have chest pain.  You have blood in your urine or stool, or you vomit blood. Summary  After the procedure, it is common to have a sore throat or nausea. It is also common to feel tired.  Have a responsible adult stay with you for the time you are told. It is important to have someone help care for you until you are awake and alert.  When you feel hungry, start by eating small amounts of foods that are soft and easy to digest (bland), such as toast. Gradually return to your regular diet.  Drink enough fluid to keep your urine pale yellow.  Return to your normal activities as told by your health care provider. Ask your health care provider what activities are safe for you. This information is not intended to replace advice given to you by your health care provider. Make sure you discuss any questions you  have with your health care provider. Document Revised: 07/02/2020 Document Reviewed: 01/30/2020 Elsevier Patient Education  2021 [employer].

## 2021-01-11 ENCOUNTER — Other Ambulatory Visit: Payer: Self-pay

## 2021-01-11 ENCOUNTER — Other Ambulatory Visit (HOSPITAL_COMMUNITY)
Admission: RE | Admit: 2021-01-11 | Discharge: 2021-01-11 | Disposition: A | Discharge status: Home / Self Care | Payer: BC Managed Care – PPO | Source: Ambulatory Visit | Attending: General Surgery | Admitting: General Surgery

## 2021-01-11 ENCOUNTER — Encounter (HOSPITAL_COMMUNITY): Payer: Self-pay

## 2021-01-11 ENCOUNTER — Encounter (HOSPITAL_COMMUNITY)
Admission: RE | Admit: 2021-01-11 | Discharge: 2021-01-11 | Disposition: A | Discharge status: Home / Self Care | Payer: PPO | Source: Ambulatory Visit | Attending: General Surgery | Admitting: General Surgery

## 2021-01-11 DIAGNOSIS — Z01818 Encounter for other preprocedural examination: Secondary | ICD-10-CM | POA: Diagnosis not present

## 2021-01-11 DIAGNOSIS — Z20822 Contact with and (suspected) exposure to covid-19: Secondary | ICD-10-CM | POA: Insufficient documentation

## 2021-01-11 LAB — CBC WITH DIFFERENTIAL/PLATELET
Abs Immature Granulocytes: 0.02 10*3/uL (ref 0.00–0.07)
Basophils Absolute: 0 10*3/uL (ref 0.0–0.1)
Basophils Relative: 1 %
Eosinophils Absolute: 0.1 10*3/uL (ref 0.0–0.5)
Eosinophils Relative: 2 %
HCT: 45.7 % (ref 39.0–52.0)
Hemoglobin: 14.8 g/dL (ref 13.0–17.0)
Immature Granulocytes: 0 %
Lymphocytes Relative: 26 %
Lymphs Abs: 2 10*3/uL (ref 0.7–4.0)
MCH: 26.3 pg (ref 26.0–34.0)
MCHC: 32.4 g/dL (ref 30.0–36.0)
MCV: 81.3 fL (ref 80.0–100.0)
Monocytes Absolute: 0.7 10*3/uL (ref 0.1–1.0)
Monocytes Relative: 9 %
Neutro Abs: 5 10*3/uL (ref 1.7–7.7)
Neutrophils Relative %: 62 %
Platelets: 228 10*3/uL (ref 150–400)
RBC: 5.62 MIL/uL (ref 4.22–5.81)
RDW: 15.1 % (ref 11.5–15.5)
WBC: 7.9 10*3/uL (ref 4.0–10.5)
nRBC: 0 % (ref 0.0–0.2)

## 2021-01-11 LAB — BASIC METABOLIC PANEL
Anion gap: 9 (ref 5–15)
BUN: 21 mg/dL (ref 8–23)
CO2: 20 mmol/L — ABNORMAL LOW (ref 22–32)
Calcium: 9.1 mg/dL (ref 8.9–10.3)
Chloride: 107 mmol/L (ref 98–111)
Creatinine, Ser: 0.98 mg/dL (ref 0.61–1.24)
GFR, Estimated: >60 mL/min (ref >60)
Glucose, Bld: 113 mg/dL — ABNORMAL HIGH (ref 70–99)
Potassium: 3.7 mmol/L (ref 3.5–5.1)
Sodium: 136 mmol/L (ref 135–145)

## 2021-01-11 LAB — SARS CORONAVIRUS 2 (TAT 6-24 HRS): SARS Coronavirus 2: NEGATIVE

## 2021-01-12 ENCOUNTER — Telehealth: Payer: Self-pay

## 2021-01-13 ENCOUNTER — Encounter (HOSPITAL_COMMUNITY): Payer: Self-pay | Admitting: General Surgery

## 2021-01-13 ENCOUNTER — Encounter (HOSPITAL_COMMUNITY)
Admission: RE | Disposition: A | Discharge status: Home / Self Care | Payer: Self-pay | Source: Home / Self Care | Attending: General Surgery

## 2021-01-13 ENCOUNTER — Ambulatory Visit (HOSPITAL_COMMUNITY): Payer: PPO | Admitting: Anesthesiology

## 2021-01-13 ENCOUNTER — Ambulatory Visit (HOSPITAL_COMMUNITY)
Admission: RE | Admit: 2021-01-13 | Discharge: 2021-01-13 | Disposition: A | Discharge status: Home / Self Care | Payer: PPO | Attending: General Surgery | Admitting: General Surgery

## 2021-01-13 DIAGNOSIS — Z833 Family history of diabetes mellitus: Secondary | ICD-10-CM | POA: Diagnosis not present

## 2021-01-13 DIAGNOSIS — I422 Other hypertrophic cardiomyopathy: Secondary | ICD-10-CM | POA: Diagnosis not present

## 2021-01-13 DIAGNOSIS — Z8249 Family history of ischemic heart disease and other diseases of the circulatory system: Secondary | ICD-10-CM | POA: Diagnosis not present

## 2021-01-13 DIAGNOSIS — F17210 Nicotine dependence, cigarettes, uncomplicated: Secondary | ICD-10-CM | POA: Insufficient documentation

## 2021-01-13 DIAGNOSIS — K4090 Unilateral inguinal hernia, without obstruction or gangrene, not specified as recurrent: Secondary | ICD-10-CM | POA: Diagnosis not present

## 2021-01-13 DIAGNOSIS — N400 Enlarged prostate without lower urinary tract symptoms: Secondary | ICD-10-CM | POA: Diagnosis not present

## 2021-01-13 DIAGNOSIS — Z79899 Other long term (current) drug therapy: Secondary | ICD-10-CM | POA: Diagnosis not present

## 2021-01-13 DIAGNOSIS — I10 Essential (primary) hypertension: Secondary | ICD-10-CM | POA: Insufficient documentation

## 2021-01-13 DIAGNOSIS — D176 Benign lipomatous neoplasm of spermatic cord: Secondary | ICD-10-CM | POA: Insufficient documentation

## 2021-01-13 DIAGNOSIS — Z7982 Long term (current) use of aspirin: Secondary | ICD-10-CM | POA: Diagnosis not present

## 2021-01-13 HISTORY — PX: INGUINAL HERNIA REPAIR: SHX194

## 2021-01-13 SURGERY — REPAIR, HERNIA, INGUINAL, ADULT
Anesthesia: General | Site: Inguinal | Laterality: Right

## 2021-01-13 MED ORDER — LIDOCAINE HCL (CARDIAC) PF 100 MG/5ML IV SOSY
PREFILLED_SYRINGE | INTRAVENOUS | Status: DC | PRN
  Administered 2021-01-13: 100 mg via INTRAVENOUS

## 2021-01-13 MED ORDER — MIDAZOLAM HCL 2 MG/2ML IJ SOLN
INTRAMUSCULAR | Status: AC
  Filled 2021-01-13: qty 2

## 2021-01-13 MED ORDER — CHLORHEXIDINE GLUCONATE 0.12 % MT SOLN: 15.0000 mL | Freq: Once | OROMUCOSAL | Status: AC

## 2021-01-13 MED ORDER — HYDROMORPHONE HCL 1 MG/ML IJ SOLN: 0.2500 mg | INTRAMUSCULAR | Status: DC | PRN

## 2021-01-13 MED ORDER — PROMETHAZINE HCL 25 MG/ML IJ SOLN: 6.2500 mg | INTRAMUSCULAR | Status: DC | PRN

## 2021-01-13 MED ORDER — CEFAZOLIN SODIUM-DEXTROSE 2-4 GM/100ML-% IV SOLN
INTRAVENOUS | Status: AC
  Filled 2021-01-13: qty 100

## 2021-01-13 MED ORDER — PROPOFOL 10 MG/ML IV BOLUS
INTRAVENOUS | Status: AC
  Filled 2021-01-13: qty 20

## 2021-01-13 MED ORDER — PROPOFOL 10 MG/ML IV BOLUS
INTRAVENOUS | Status: DC | PRN
  Administered 2021-01-13: 200 mg via INTRAVENOUS

## 2021-01-13 MED ORDER — CHLORHEXIDINE GLUCONATE 0.12 % MT SOLN
OROMUCOSAL | Status: AC
  Administered 2021-01-13: 15 mL
  Filled 2021-01-13: qty 15

## 2021-01-13 MED ORDER — CEFAZOLIN SODIUM-DEXTROSE 2-4 GM/100ML-% IV SOLN
2.0000 g | INTRAVENOUS | Status: AC
  Administered 2021-01-13: 2 g via INTRAVENOUS

## 2021-01-13 MED ORDER — KETOROLAC TROMETHAMINE 30 MG/ML IJ SOLN
30.0000 mg | Freq: Once | INTRAMUSCULAR | Status: AC
  Administered 2021-01-13: 30 mg via INTRAVENOUS
  Filled 2021-01-13: qty 1

## 2021-01-13 MED ORDER — GLYCOPYRROLATE PF 0.2 MG/ML IJ SOSY
PREFILLED_SYRINGE | INTRAMUSCULAR | Status: AC
  Filled 2021-01-13: qty 1

## 2021-01-13 MED ORDER — LACTATED RINGERS IV SOLN
INTRAVENOUS | Status: DC
  Administered 2021-01-13: 1000 mL via INTRAVENOUS

## 2021-01-13 MED ORDER — SODIUM CHLORIDE 0.9 % IR SOLN
Status: DC | PRN
  Administered 2021-01-13: 1000 mL

## 2021-01-13 MED ORDER — HYDROCODONE-ACETAMINOPHEN 5-325 MG PO TABS: 1.0000 | ORAL_TABLET | ORAL | 0 refills | Status: DC | PRN

## 2021-01-13 MED ORDER — BUPIVACAINE LIPOSOME 1.3 % IJ SUSP
INTRAMUSCULAR | Status: AC
  Filled 2021-01-13: qty 20

## 2021-01-13 MED ORDER — GLYCOPYRROLATE 0.2 MG/ML IJ SOLN
INTRAMUSCULAR | Status: DC | PRN
  Administered 2021-01-13: .1 mg via INTRAVENOUS

## 2021-01-13 MED ORDER — MEPERIDINE HCL 50 MG/ML IJ SOLN: 6.2500 mg | INTRAMUSCULAR | Status: DC | PRN

## 2021-01-13 MED ORDER — EPHEDRINE SULFATE 50 MG/ML IJ SOLN
INTRAMUSCULAR | Status: DC | PRN
  Administered 2021-01-13 (×2): 5 mg via INTRAVENOUS

## 2021-01-13 MED ORDER — CHLORHEXIDINE GLUCONATE CLOTH 2 % EX PADS: 6.0000 | MEDICATED_PAD | Freq: Once | CUTANEOUS | Status: DC

## 2021-01-13 MED ORDER — DEXAMETHASONE SODIUM PHOSPHATE 10 MG/ML IJ SOLN
INTRAMUSCULAR | Status: DC | PRN
  Administered 2021-01-13: 8 mg via INTRAVENOUS

## 2021-01-13 MED ORDER — MIDAZOLAM HCL 2 MG/2ML IJ SOLN
INTRAMUSCULAR | Status: DC | PRN
  Administered 2021-01-13: 2 mg via INTRAVENOUS

## 2021-01-13 MED ORDER — ONDANSETRON HCL 4 MG/2ML IJ SOLN
INTRAMUSCULAR | Status: DC | PRN
  Administered 2021-01-13: 4 mg via INTRAVENOUS

## 2021-01-13 MED ORDER — BUPIVACAINE LIPOSOME 1.3 % IJ SUSP
INTRAMUSCULAR | Status: DC | PRN
  Administered 2021-01-13: 20 mL

## 2021-01-13 MED ORDER — FENTANYL CITRATE (PF) 250 MCG/5ML IJ SOLN
INTRAMUSCULAR | Status: DC | PRN
  Administered 2021-01-13 (×3): 50 ug via INTRAVENOUS

## 2021-01-13 MED ORDER — FENTANYL CITRATE (PF) 250 MCG/5ML IJ SOLN
INTRAMUSCULAR | Status: AC
  Filled 2021-01-13: qty 5

## 2021-01-13 MED ORDER — PHENYLEPHRINE 40 MCG/ML (10ML) SYRINGE FOR IV PUSH (FOR BLOOD PRESSURE SUPPORT)
PREFILLED_SYRINGE | INTRAVENOUS | Status: AC
  Filled 2021-01-13: qty 30

## 2021-01-13 MED ORDER — ORAL CARE MOUTH RINSE
15.0000 mL | Freq: Once | OROMUCOSAL | Status: AC
  Administered 2021-01-13: 15 mL via OROMUCOSAL

## 2021-01-13 SURGICAL SUPPLY — 35 items
ADH SKN CLS APL DERMABOND .7 (GAUZE/BANDAGES/DRESSINGS) ×1
CLOTH BEACON ORANGE TIMEOUT ST (SAFETY) ×2 IMPLANT
COVER LIGHT HANDLE STERIS (MISCELLANEOUS) ×4 IMPLANT
COVER WAND RF STERILE (DRAPES) ×2 IMPLANT
DERMABOND ADVANCED (GAUZE/BANDAGES/DRESSINGS) ×1
DERMABOND ADVANCED .7 DNX12 (GAUZE/BANDAGES/DRESSINGS) ×1 IMPLANT
DRAIN PENROSE 0.5X18 (DRAIN) ×2 IMPLANT
ELECT REM PT RETURN 9FT ADLT (ELECTROSURGICAL) ×2
ELECTRODE REM PT RTRN 9FT ADLT (ELECTROSURGICAL) ×1 IMPLANT
GAUZE SPONGE 4X4 12PLY STRL (GAUZE/BANDAGES/DRESSINGS) ×2 IMPLANT
GLOVE SURG SS PI 7.5 STRL IVOR (GLOVE) ×2 IMPLANT
GLOVE SURG UNDER POLY LF SZ7 (GLOVE) ×6 IMPLANT
GOWN STRL REUS W/TWL LRG LVL3 (GOWN DISPOSABLE) ×6 IMPLANT
INST SET MINOR GENERAL (KITS) ×2 IMPLANT
KIT TURNOVER KIT A (KITS) ×2 IMPLANT
MANIFOLD NEPTUNE II (INSTRUMENTS) ×2 IMPLANT
MESH MARLEX PLUG MEDIUM (Mesh General) ×1 IMPLANT
NDL HYPO 18GX1.5 BLUNT FILL (NEEDLE) ×1 IMPLANT
NDL HYPO 21X1.5 SAFETY (NEEDLE) ×1 IMPLANT
NEEDLE HYPO 18GX1.5 BLUNT FILL (NEEDLE) ×2 IMPLANT
NEEDLE HYPO 21X1.5 SAFETY (NEEDLE) ×2 IMPLANT
NS IRRIG 1000ML POUR BTL (IV SOLUTION) ×2 IMPLANT
PACK MINOR (CUSTOM PROCEDURE TRAY) ×2 IMPLANT
PAD ARMBOARD 7.5X6 YLW CONV (MISCELLANEOUS) ×2 IMPLANT
PENCIL SMOKE EVACUATOR (MISCELLANEOUS) ×2 IMPLANT
SET BASIN LINEN APH (SET/KITS/TRAYS/PACK) ×2 IMPLANT
SOL PREP PROV IODINE SCRUB 4OZ (MISCELLANEOUS) ×2 IMPLANT
SUT MNCRL AB 4-0 PS2 18 (SUTURE) ×2 IMPLANT
SUT NOVA NAB GS-22 2 2-0 T-19 (SUTURE) ×5 IMPLANT
SUT VIC AB 2-0 CT1 27 (SUTURE) ×2
SUT VIC AB 2-0 CT1 TAPERPNT 27 (SUTURE) ×1 IMPLANT
SUT VIC AB 3-0 SH 27 (SUTURE) ×2
SUT VIC AB 3-0 SH 27X BRD (SUTURE) ×1 IMPLANT
SUT VICRYL AB 2 0 TIES (SUTURE) ×1 IMPLANT
SYR 20ML LL LF (SYRINGE) ×4 IMPLANT

## 2021-01-13 NOTE — Anesthesia Procedure Notes (Signed)
Procedure Name: LMA Insertion Date/Time: 01/13/2021 11:30 AM Performed by: Orlie Dakin, CRNA Pre-anesthesia Checklist: Patient identified, Emergency Drugs available, Suction available and Patient being monitored Patient Re-evaluated:Patient Re-evaluated prior to induction Oxygen Delivery Method: Circle system utilized Preoxygenation: Pre-oxygenation with 100% oxygen Induction Type: IV induction LMA: LMA inserted LMA Size: 4.0 Tube type: Oral Number of attempts: 1 Placement Confirmation: positive ETCO2 Tube secured with: Tape Dental Injury: Teeth and Oropharynx as per pre-operative assessment

## 2021-01-13 NOTE — Telephone Encounter (Signed)
Please call patient and schedule an appt with Dettinger if he wishes. Can be next available.

## 2021-01-13 NOTE — Op Note (Signed)
Patient:  [name] Foster.  DOB:  [birth year]  MRN:  [id number]   Preop Diagnosis: Right inguinal hernia  Postop Diagnosis: Same  Procedure: Right inguinal herniorrhaphy with mesh  Surgeon: Aviva Signs, MD  Anes: General  Indications: Patient is a 65 year old black male who presents with a symptomatic right inguinal hernia.  The risks and benefits of the procedure including bleeding, infection, mesh use, and the possibility of recurrence of the hernia were fully explained to the patient, who gave informed consent.  Procedure note: The patient was placed in the supine position.  After general anesthesia was administered, the right groin region was prepped and draped using the usual sterile technique with Betadine.  Surgical site confirmation was performed.  An incision was made in the right groin region down to the external Bleich aponeurosis.  The aponeurosis was incised to the external ring.  The Penrose drain was placed around the spermatic cord.  The vas deferens was noted within the spermatic cord.  The ilioinguinal nerve was identified and retracted inferiorly from the operative field.  A lipoma of the cord was found and a high ligation was performed using a 2-0 Vicryl tie.  An indirect hernia sac was found.  This was freed away from the spermatic cord up to the peritoneal reflection and inverted.  A medium size Bard PerFix plug was then inserted.  An onlay patch was placed along the floor of the inguinal canal and secured superiorly to the conjoined tendon and inferiorly to the shelving edge of Poupart's ligament using 2-0 Novafil interrupted sutures.  The internal ring was recreated using a 2-0 Novafil interrupted suture.  The external Bleich aponeurosis was reapproximated using a 2-0 Vicryl running suture.  The subcutaneous layer was reapproximated using a 3-0 Vicryl interrupted suture.  Exparel was instilled into the surrounding wound.  The skin was closed using a 4-0 Monocryl subcuticular  suture.  Dermabond was applied.  All tape and needle counts were correct at the end of the procedure.  The patient was awakened and transferred to PACU in stable condition.  Complications: None  EBL: Minimal  Specimen: None

## 2021-01-13 NOTE — Transfer of Care (Signed)
Immediate Anesthesia Transfer of Care Note  Patient: [name] Foster.  Procedure(s) Performed: HERNIA REPAIR INGUINAL ADULT (Right Inguinal)  Patient Location: PACU  Anesthesia Type:General  Level of Consciousness: drowsy  Airway & Oxygen Therapy: Patient Spontanous Breathing and Patient connected to nasal cannula oxygen  Post-op Assessment: Report given to RN and Post -op Vital signs reviewed and stable  Post vital signs: Reviewed and stable  Last Vitals:  Vitals Value Taken Time  BP 122/79   Temp    Pulse 80 01/13/21 1220  Resp 12 01/13/21 1220  SpO2 90 % 01/13/21 1220  Vitals shown include unvalidated device data.  Last Pain:  Vitals:   01/13/21 1022  TempSrc: Oral  PainSc: 0-No pain      Patients Stated Pain Goal: 6 ([id number])  Complications: No complications documented.

## 2021-01-13 NOTE — Telephone Encounter (Signed)
Made pt appt with Dr. Warrick Parisian and left message on voice mail about appt

## 2021-01-13 NOTE — Anesthesia Postprocedure Evaluation (Signed)
Anesthesia Post Note  Patient: [name] Foster.  Procedure(s) Performed: HERNIA REPAIR INGUINAL ADULT (Right Inguinal)  Patient location during evaluation: PACU Anesthesia Type: General Level of consciousness: awake and alert Pain management: satisfactory to patient Vital Signs Assessment: post-procedure vital signs reviewed and stable Respiratory status: respiratory function stable and spontaneous breathing Cardiovascular status: blood pressure returned to baseline Postop Assessment: no apparent nausea or vomiting and adequate PO intake Anesthetic complications: no   No complications documented.   Last Vitals:  Vitals:   01/13/21 1245 01/13/21 1253  BP: 116/87   Pulse: 79 76  Resp: 13 16  Temp:    SpO2: 99% 100%    Last Pain:  Vitals:   01/13/21 1022  TempSrc: Oral  PainSc: 0-No pain                 Karna Dupes

## 2021-01-13 NOTE — Anesthesia Preprocedure Evaluation (Signed)
Anesthesia Evaluation  Patient identified by MRN, date of birth, ID band Patient awake    Reviewed: Allergy & Precautions, NPO status , Patient's Chart, lab work & pertinent test results, reviewed documented beta blocker date and time   History of Anesthesia Complications Negative for: history of anesthetic complications  Airway Mallampati: II  TM Distance: >3 FB Neck ROM: Full    Dental  (+) Dental Advisory Given, Missing   Pulmonary shortness of breath and with exertion, Current Smoker and Patient abstained from smoking.,    Pulmonary exam normal breath sounds clear to auscultation       Cardiovascular Exercise Tolerance: Good METS: 3 - Mets hypertension, Pt. on medications and Pt. on home beta blockers + angina (occasionally) (-) CHF (apical variant hypertrphic cardiomyopathy) Normal cardiovascular exam Rhythm:Regular Rate:Normal - Systolic murmurs, - Diastolic murmurs, - Friction Rub, - Carotid Bruit, - Peripheral Edema and - Systolic Click Sinus  Rhythm  -Anteroseptal infarct -age undetermined.   -  T-abnormality  - Anterolateral ischemia.     Neuro/Psych negative neurological ROS  negative psych ROS   GI/Hepatic negative GI ROS, Neg liver ROS,   Endo/Other  negative endocrine ROS  Renal/GU Renal disease (right kidney mass, partial nephrectomy?)  negative genitourinary   Musculoskeletal  (+) Arthritis ,   Abdominal   Peds negative pediatric ROS (+)  Hematology negative hematology ROS (+)   Anesthesia Other Findings Left ventricle: The cavity size was normal. There was mild concentric hypertrophy. Systolic function was normal. The estimated ejection fraction was in the range of 60% to 65%. Wall motion was normal; there were no regional wall motion abnormalities. Doppler parameters are consistent with abnormal left ventricular relaxation (grade 1 diastolic dysfunction). The E/e' ratio is <10 , suggesting normal  LV filling pressure.  - Left atrium: The atrium was normal in size.  - Inferior vena cava: The vessel was normal in size; the  respirophasic diameter changes were in the normal range (= 50%); findings are consistent with normal central venous pressure.  - Pericardium, extracardiac: There was no pericardial  effusion.    Reproductive/Obstetrics                            Anesthesia Physical  Anesthesia Plan  ASA: II  Anesthesia Plan: General   Post-op Pain Management:    Induction: Intravenous  PONV Risk Score and Plan: 1 and 2 and Ondansetron, Dexamethasone and Midazolam  Airway Management Planned: LMA  Additional Equipment:   Intra-op Plan:   Post-operative Plan:   Informed Consent: I have reviewed the patients History and Physical, chart, labs and discussed the procedure including the risks, benefits and alternatives for the proposed anesthesia with the patient or authorized representative who has indicated his/her understanding and acceptance.     Dental advisory given  Plan Discussed with: CRNA and Surgeon  Anesthesia Plan Comments:        Anesthesia Quick Evaluation

## 2021-01-13 NOTE — Discharge Instructions (Signed)
No traveling until next week.    Open Hernia Repair, Adult, Care After What can I expect after the procedure? After the procedure, it is common to have:  Mild discomfort.  Slight bruising.  Mild swelling.  Pain in the belly (abdomen).  A small amount of blood from the cut from surgery (incision). Follow these instructions at home: Your doctor may give you more specific instructions. If you have problems, call your doctor. Medicines  Take over-the-counter and prescription medicines only as told by your doctor.  If told, take steps to prevent problems with pooping (constipation). You may need to: ? Drink enough fluid to keep your pee (urine) pale yellow. ? Take medicines. You will be told what medicines to take. ? Eat foods that are high in fiber. These include beans, whole grains, and fresh fruits and vegetables. ? Limit foods that are high in fat and sugar. These include fried or sweet foods.  Ask your doctor if you should avoid driving or using machines while you are taking your medicine. Incision care  Follow instructions from your doctor about how to take care of your incision. Make sure you: ? Wash your hands with soap and water for at least 20 seconds before and after you change your bandage (dressing). If you cannot use soap and water, use hand sanitizer. ? Change your bandage. ? Leave stitches or skin glue in place for at least 2 weeks. ? Leave tape strips alone unless you are told to take them off. You may trim the edges of the tape strips if they curl up.  Check your incision every day for signs of infection. Check for: ? More redness, swelling, or pain. ? More fluid or blood. ? Warmth. ? Pus or a bad smell.  Wear loose, soft clothing while your incision heals.   Activity  Rest as told by your doctor.  Do not lift anything that is heavier than 10 lb (4.5 kg), or the limit that you are told.  Do not play contact sports until your doctor says that this is  safe.  If you were given a sedative during your procedure, do not drive or use machines until your doctor says that it is safe. A sedative is a medicine that helps you relax.  Return to your normal activities when your doctor says that it is safe.   General instructions  Do not take baths, swim, or use a hot tub. Ask your doctor about taking showers or sponge baths.  Hold a pillow over your belly when you cough or sneeze. This helps with pain.  Do not smoke or use any products that contain nicotine or tobacco. If you need help quitting, ask your doctor.  Keep all follow-up visits. Contact a doctor if:  You have any of these signs of infection in or around your incision: ? More redness, swelling, or pain. ? More fluid or blood. ? Warmth. ? Pus. ? A bad smell.  You have a fever or chills.  You have blood in your poop (stool).  You have not pooped (had a bowel movement) in 2-3 days.  Medicine does not help your pain. Get help right away if:  You have chest pain, or you are short of breath.  You feel faint or light-headed.  You have very bad pain.  You vomit and your pain is worse.  You have pain, swelling, or redness in a leg. These symptoms may be an emergency. Get help right away. Call your local emergency  services (911 in the [place]  Do not wait to see if the symptoms will go away.  Do not drive yourself to the hospital. Summary  After this procedure, it is common to have mild discomfort, slight bruising, and mild swelling.  Follow instructions from your doctor about how to take care of your cut from surgery (incision). Check every day for signs of infection.  Do not lift heavy objects or play contact sports until your doctor says it is safe.  Return to your normal activities as told by your doctor. This information is not intended to replace advice given to you by your health care provider. Make sure you discuss any questions you have with your health care  provider. Document Revised: 06/01/2020 Document Reviewed: 06/01/2020 Elsevier Patient Education  2021 [place] Anesthesia, Adult, Care After This sheet gives you information about how to care for yourself after your procedure. Your health care provider may also give you more specific instructions. If you have problems or questions, contact your health care provider. What can I expect after the procedure? After the procedure, the following side effects are common:  Pain or discomfort at the IV site.  Nausea.  Vomiting.  Sore throat.  Trouble concentrating.  Feeling cold or chills.  Feeling weak or tired.  Sleepiness and fatigue.  Soreness and body aches. These side effects can affect parts of the body that were not involved in surgery. Follow these instructions at home: For the time period you were told by your health care provider:  Rest.  Do not participate in activities where you could fall or become injured.  Do not drive or use machinery.  Do not drink alcohol.  Do not take sleeping pills or medicines that cause drowsiness.  Do not make important decisions or sign legal documents.  Do not take care of children on your own.   Eating and drinking  Follow any instructions from your health care provider about eating or drinking restrictions.  When you feel hungry, start by eating small amounts of foods that are soft and easy to digest (bland), such as toast. Gradually return to your regular diet.  Drink enough fluid to keep your urine pale yellow.  If you vomit, rehydrate by drinking water, juice, or clear broth. General instructions  If you have sleep apnea, surgery and certain medicines can increase your risk for breathing problems. Follow instructions from your health care provider about wearing your sleep device: ? Anytime you are sleeping, including during daytime naps. ? While taking prescription pain medicines, sleeping medicines,  or medicines that make you drowsy.  Have a responsible adult stay with you for the time you are told. It is important to have someone help care for you until you are awake and alert.  Return to your normal activities as told by your health care provider. Ask your health care provider what activities are safe for you.  Take over-the-counter and prescription medicines only as told by your health care provider.  If you smoke, do not smoke without supervision.  Keep all follow-up visits as told by your health care provider. This is important. Contact a health care provider if:  You have nausea or vomiting that does not get better with medicine.  You cannot eat or drink without vomiting.  You have pain that does not get better with medicine.  You are unable to pass urine.  You develop a skin rash.  You have a fever.  You have redness around your IV site that gets worse. Get help right away if:  You have difficulty breathing.  You have chest pain.  You have blood in your urine or stool, or you vomit blood. Summary  After the procedure, it is common to have a sore throat or nausea. It is also common to feel tired.  Have a responsible adult stay with you for the time you are told. It is important to have someone help care for you until you are awake and alert.  When you feel hungry, start by eating small amounts of foods that are soft and easy to digest (bland), such as toast. Gradually return to your regular diet.  Drink enough fluid to keep your urine pale yellow.  Return to your normal activities as told by your health care provider. Ask your health care provider what activities are safe for you. This information is not intended to replace advice given to you by your health care provider. Make sure you discuss any questions you have with your health care provider. Document Revised: 07/02/2020 Document Reviewed: 01/30/2020 Elsevier Patient Education  2021 [place] UNTIL Sunday January 17, 2021.  DO NOT USE ANY ADDITIONAL NUMBING MEDICATION UNTIL AFTER Sunday   Bupivacaine Liposomal Suspension for Injection What is this medicine? BUPIVACAINE LIPOSOMAL (bue PIV a kane LIP oh som al) is an anesthetic. It causes loss of feeling in the skin or other tissues. It is used to prevent and to treat pain from some procedures. This medicine may be used for other purposes; ask your health care provider or pharmacist if you have questions. COMMON BRAND NAME(S): EXPAREL What should I tell my health care provider before I take this medicine? They need to know if you have any of these conditions:  G6PD deficiency  heart disease  kidney disease  liver disease  low blood pressure  lung or breathing disease, like asthma  an unusual or allergic reaction to bupivacaine, other medicines, foods, dyes, or preservatives  pregnant or trying to get pregnant  breast-feeding How should I use this medicine? This medicine is injected into the affected area. It is given by a health care provider in a hospital or clinic setting. Talk to your health care provider about the use of this medicine in children. While it may be given to children as young as 6 years for selected conditions, precautions do apply. Overdosage: If you think you have taken too much of this medicine contact a poison control center or emergency room at once. NOTE: This medicine is only for you. Do not share this medicine with others. What if I miss a dose? This does not apply. What may interact with this medicine? This medicine may interact with the following medications:  acetaminophen  certain antibiotics like dapsone, nitrofurantoin, aminosalicylic acid, sulfonamides  certain medicines for seizures like phenobarbital, phenytoin, valproic acid  chloroquine  cyclophosphamide  flutamide  hydroxyurea  ifosfamide  metoclopramide  nitric  oxide  nitroglycerin  nitroprusside  nitrous oxide  other local anesthetics like lidocaine, pramoxine, tetracaine  primaquine  quinine  rasburicase  sulfasalazine This list may not describe all possible interactions. Give your health care provider a list of all the medicines, herbs, non-prescription drugs, or dietary supplements you use. Also tell them if you smoke, drink alcohol, or use illegal drugs. Some items may interact with your medicine. What should I watch for while using this medicine? Your condition will  be monitored carefully while you are receiving this medicine. Be careful to avoid injury while the area is numb, and you are not aware of pain. What side effects may I notice from receiving this medicine? Side effects that you should report to your doctor or health care professional as soon as possible:  allergic reactions like skin rash, itching or hives, swelling of the face, lips, or tongue  seizures  signs and symptoms of a dangerous change in heartbeat or heart rhythm like chest pain; dizziness; fast, irregular heartbeat; palpitations; feeling faint or lightheaded; falls; breathing problems  signs and symptoms of methemoglobinemia such as pale, gray, or blue colored skin; headache; fast heartbeat; shortness of breath; feeling faint or lightheaded, falls; tiredness Side effects that usually do not require medical attention (report to your doctor or health care professional if they continue or are bothersome):  anxious  back pain  changes in taste  changes in vision  constipation  dizziness  fever  nausea, vomiting This list may not describe all possible side effects. Call your doctor for medical advice about side effects. You may report side effects to FDA at 1-800-FDA-1088. Where should I keep my medicine? This drug is given in a hospital or clinic and will not be stored at home. NOTE: This sheet is a summary. It may not cover all possible  information. If you have questions about this medicine, talk to your doctor, pharmacist, or health care provider.  2021 Elsevier/Gold Standard (2020-01-23 12:24:57)    Acetaminophen; Hydrocodone tablets or capsules What is this medicine? ACETAMINOPHEN; HYDROCODONE (a set a MEE noe fen; hye droe KOE done) is a pain reliever. It is used to treat moderate to severe pain. This medicine may be used for other purposes; ask your health care provider or pharmacist if you have questions. COMMON BRAND NAME(S): Anexsia, Bancap HC, Ceta-Plus, Co-Gesic, Comfortpak, Dolagesic, [employer], DuoCet, Hydrocet, Hydrogesic, [place], Lorcet HD, Lorcet Plus, Lortab, Margesic H, Maxidone, [place], Polygesic, [place], [place], [place], Vicodin, Vicodin ES, Vicodin HP, Charlane Ferretti What should I tell my health care provider before I take this medicine? They need to know if you have any of these conditions:  brain tumor  drug abuse or addiction  head injury  heart disease  if you often drink alcohol  kidney disease  liver disease  low adrenal gland function  lung disease, asthma, or breathing problems  seizures  stomach or intestine problems  taken an MAOI like Marplan, Nardil, or Parnate in the last 14 days  an unusual or allergic reaction to acetaminophen, hydrocodone, other medicines, foods, dyes, or preservatives  pregnant or trying to get pregnant  breast-feeding How should I use this medicine? Take this medicine by mouth with a glass of water. Follow the directions on the prescription label. You can take it with or without food. If it upsets your stomach, take it with food. Do not take your medicine more often than directed. A special MedGuide will be given to you by the pharmacist with each prescription and refill. Be sure to read this information carefully each time. Talk to your pediatrician regarding the use of this medicine in children. Special care may be needed. Overdosage: If  you think you have taken too much of this medicine contact a poison control center or emergency room at once. NOTE: This medicine is only for you. Do not share this medicine with others. What if I miss a dose? If you miss a dose, take it as soon as you can.  If it is almost time for your next dose, take only that dose. Do not take double or extra doses. What may interact with this medicine? This medicine may interact with the following medications:  alcohol  antiviral medicines for HIV or AIDS  atropine  antihistamines for allergy, cough and cold  certain antibiotics like erythromycin, clarithromycin  certain medicines for anxiety or sleep  certain medicines for bladder problems like oxybutynin, tolterodine  certain medicines for depression like amitriptyline, fluoxetine, sertraline  certain medicines for fungal infections like ketoconazole and itraconazole  certain medicines for Parkinson's disease like benztropine, trihexyphenidyl  certain medicines for seizures like carbamazepine, phenobarbital, phenytoin, primidone  certain medicines for stomach problems like dicyclomine, hyoscyamine  certain medicines for travel sickness like scopolamine  general anesthetics like halothane, isoflurane, methoxyflurane, propofol  ipratropium  local anesthetics like lidocaine, pramoxine, tetracaine  MAOIs like Carbex, Eldepryl, Marplan, Nardil, and Parnate  medicines that relax muscles for surgery  other medicines with acetaminophen  other narcotic medicines for pain or cough  phenothiazines like chlorpromazine, mesoridazine, prochlorperazine, thioridazine  rifampin This list may not describe all possible interactions. Give your health care provider a list of all the medicines, herbs, non-prescription drugs, or dietary supplements you use. Also tell them if you smoke, drink alcohol, or use illegal drugs. Some items may interact with your medicine. What should I watch for while  using this medicine? Tell your health care provider if your pain does not go away, if it gets worse, or if you have new or a different type of pain. You may develop tolerance to this drug. Tolerance means that you will need a higher dose of the drug for pain relief. Tolerance is normal and is expected if you take this drug for a long time. There are different types of narcotic drugs (opioids) for pain. If you take more than one type at the same time, you may have more side effects. Give your health care provider a list of all drugs you use. He or she will tell you how much drug to take. Do not take more drug than directed. Get emergency help right away if you have problems breathing. Do not suddenly stop taking your drug because you may develop a severe reaction. Your body becomes used to the drug. This does NOT mean you are addicted. Addiction is a behavior related to getting and using a drug for a nonmedical reason. If you have pain, you have a medical reason to take pain drug. Your health care provider will tell you how much drug to take. If your health care provider wants you to stop the drug, the dose will be slowly lowered over time to avoid any side effects. Talk to your health care provider about naloxone and how to get it. Naloxone is an emergency drug used for an opioid overdose. An overdose can happen if you take too much opioid. It can also happen if an opioid is taken with some other drugs or substances, like alcohol. Know the symptoms of an overdose, like trouble breathing, unusually tired or sleepy, or not being able to respond or wake up. Make sure to tell caregivers and close contacts where it is stored. Make sure they know how to use it. After naloxone is given, you must get emergency help right away. Naloxone is a temporary treatment. Repeat doses may be needed. Do not take other drugs that contain acetaminophen with this drug. Many non-prescription drugs contain acetaminophen. Always read  labels carefully. If you have  questions, ask your health care provider. If you take too much acetaminophen, get medical help right away. Too much acetaminophen can be very dangerous and cause liver damage. Even if you do not have symptoms, it is important to get help right away. You may get drowsy or dizzy. Do not drive, use machinery, or do anything that needs mental alertness until you know how this drug affects you. Do not stand up or sit up quickly, especially if you are an older patient. This reduces the risk of dizzy or fainting spells. Alcohol may interfere with the effect of this drug. Avoid alcoholic drinks. This drug will cause constipation. If you do not have a bowel movement for 3 days, call your health care provider. Your mouth may get dry. Chewing sugarless gum or sucking hard candy and drinking plenty of water may help. Contact your health care provider if the problem does not go away or is severe. What side effects may I notice from receiving this medicine? Side effects that you should report to your doctor or health care professional as soon as possible:  allergic reactions like skin rash, itching or hives, swelling of the face, lips, or tongue  breathing problems  confusion  redness, blistering, peeling or loosening of the skin, including inside the mouth  signs and symptoms of low blood pressure like dizziness; feeling faint or lightheaded, falls; unusually weak or tired  trouble passing urine or change in the amount of urine  yellowing of the eyes or skin Side effects that usually do not require medical attention (report to your doctor or health care professional if they continue or are bothersome):  constipation  dry mouth  nausea, vomiting  tiredness This list may not describe all possible side effects. Call your doctor for medical advice about side effects. You may report side effects to FDA at 1-800-FDA-1088. Where should I keep my medicine? Keep out of the  reach of children. This medicine can be abused. Keep your medicine in a safe place to protect it from theft. Do not share this medicine with anyone. Selling or giving away this medicine is dangerous and against the law. Store at room temperature between 15 and 30 degrees C (59 and 86 degrees F). This medicine may cause harm and death if it is taken by other adults, children, or pets. Return medicine that has not been used to an official disposal site. Contact the DEA at (307) 740-3414 or your city/county government to find a site. If you cannot return the medicine, flush it down the toilet. Do not use the medicine after the expiration date. NOTE: This sheet is a summary. It may not cover all possible information. If you have questions about this medicine, talk to your doctor, pharmacist, or health care provider.  2021 Elsevier/Gold Standard (2019-05-29 12:25:54)

## 2021-01-13 NOTE — Interval H&P Note (Signed)
History and Physical Interval Note:  01/13/2021 10:54 AM  [name] Foster.  has presented today for surgery, with the diagnosis of Right inguinal hernia.  The various methods of treatment have been discussed with the patient and family. After consideration of risks, benefits and other options for treatment, the patient has consented to  Procedure(s): HERNIA REPAIR INGUINAL ADULT (Right) as a surgical intervention.  The patient's history has been reviewed, patient examined, no change in status, stable for surgery.  I have reviewed the patient's chart and labs.  Questions were answered to the patient's satisfaction.     Aviva Signs

## 2021-01-13 NOTE — Telephone Encounter (Signed)
Yes that is fine, he can come back to me.

## 2021-01-14 ENCOUNTER — Encounter (HOSPITAL_COMMUNITY): Payer: Self-pay | Admitting: General Surgery

## 2021-01-23 ENCOUNTER — Telehealth (INDEPENDENT_AMBULATORY_CARE_PROVIDER_SITE_OTHER): Payer: PPO | Admitting: General Surgery

## 2021-01-23 DIAGNOSIS — Z09 Encounter for follow-up examination after completed treatment for conditions other than malignant neoplasm: Secondary | ICD-10-CM

## 2021-01-23 NOTE — Telephone Encounter (Signed)
Postoperative telephone visit performed.  Patient states he is doing well.  He had some scrotal swelling but this seems to be resolving.  He is pleased with the results.  He is having minimal incisional pain.  He may increase his activity as able.  I told him to follow-up with me in several weeks should the swelling fully resolve.

## 2021-03-04 ENCOUNTER — Ambulatory Visit (INDEPENDENT_AMBULATORY_CARE_PROVIDER_SITE_OTHER): Payer: PPO | Admitting: Family Medicine

## 2021-03-04 ENCOUNTER — Other Ambulatory Visit: Payer: Self-pay

## 2021-03-04 ENCOUNTER — Encounter: Payer: Self-pay | Admitting: Family Medicine

## 2021-03-04 VITALS — BP 125/76 | HR 97 | Ht 70.0 in | Wt 173.0 lb

## 2021-03-04 DIAGNOSIS — R7303 Prediabetes: Secondary | ICD-10-CM

## 2021-03-04 DIAGNOSIS — F17210 Nicotine dependence, cigarettes, uncomplicated: Secondary | ICD-10-CM | POA: Diagnosis not present

## 2021-03-04 DIAGNOSIS — E782 Mixed hyperlipidemia: Secondary | ICD-10-CM | POA: Diagnosis not present

## 2021-03-04 DIAGNOSIS — E785 Hyperlipidemia, unspecified: Secondary | ICD-10-CM | POA: Insufficient documentation

## 2021-03-04 DIAGNOSIS — I10 Essential (primary) hypertension: Secondary | ICD-10-CM

## 2021-03-04 LAB — BAYER DCA HB A1C WAIVED: HB A1C (BAYER DCA - WAIVED): 5.6 % (ref <7.0)

## 2021-03-04 MED ORDER — LIVALO 2 MG PO TABS: 2.0000 mg | ORAL_TABLET | Freq: Every day | ORAL | 3 refills | Status: DC

## 2021-03-04 MED ORDER — HYDROCHLOROTHIAZIDE 25 MG PO TABS: 25.0000 mg | ORAL_TABLET | Freq: Every day | ORAL | 3 refills | Status: DC

## 2021-03-04 MED ORDER — METOPROLOL TARTRATE 25 MG PO TABS: 25.0000 mg | ORAL_TABLET | Freq: Two times a day (BID) | ORAL | 3 refills | Status: DC

## 2021-03-04 NOTE — Progress Notes (Signed)
BP 125/76   Pulse 97   Ht '5\' 10"'  (1.778 m)   Wt 173 lb (78.5 kg)   SpO2 97%   BMI 24.82 kg/m    Subjective:   Patient ID: [name] Morale., male    DOB: 07/29/1956, 65 y.o.   MRN: [id number]  HPI: [name] Corday Wyka. is a 65 y.o. male presenting on 03/04/2021 for Medical Management of Chronic Issues, Hyperlipidemia, and Hypertension   HPI Hypertension Patient is currently on hctz and metoprolol, and their blood pressure today is 125/76. Patient denies any lightheadedness or dizziness. Patient denies headaches, blurred vision, chest pains, shortness of breath, or weakness. Denies any side effects from medication and is content with current medication.   Hyperlipidemia Patient is coming in for recheck of his hyperlipidemia. The patient is currently taking livalo and fish oils. They deny any issues with myalgias or history of liver damage from it. They deny any focal numbness or weakness or chest pain.   Prediabetes Patient comes in today for recheck of his diabetes. Patient has been currently taking no medicaiton. Patient is not currently on an ACE inhibitor/ARB. Patient has not seen an ophthalmologist this year. Patient denies any issues with their feet. The symptom started onset as an adult hypertension and hyperlipidemia ARE RELATED TO DM   Patient comes in complaining of right lateral elbow tenderness after sometimes he does not do a lot of repetitive motions with that arm he admits to.  It hurts right on that spot on the lateral part of his elbow, mainly hurts at night when he just rests and it sore until he gets in a good position.  Patient is 1/2 pack/day smoker now but has smoked much more in the past and has been smoking for 50 years.  He says in the distant past 9 years ago he had a scan that showed a possible nodule and is concerned about that wants to do a follow-up for it.  Relevant past medical, surgical, family and social history reviewed and updated as indicated. Interim medical  history since our last visit reviewed. Allergies and medications reviewed and updated.  Review of Systems  Constitutional: Negative for chills and fever.  Eyes: Negative for visual disturbance.  Respiratory: Negative for shortness of breath and wheezing.   Cardiovascular: Negative for chest pain and leg swelling.  Musculoskeletal: Positive for arthralgias. Negative for back pain and gait problem.  Skin: Negative for rash.  Neurological: Negative for dizziness, weakness and light-headedness.  All other systems reviewed and are negative.   Per HPI unless specifically indicated above   Allergies as of 03/04/2021   No Known Allergies     Medication List       Accurate as of Mar 04, 2021  2:23 PM. If you have any questions, ask your nurse or doctor.        STOP taking these medications   HYDROcodone-acetaminophen 5-325 MG tablet Commonly known as: Norco Stopped by: [name] Kaufmann Hitesh Fouche, [name] Foster     TAKE these medications   Fish Oil 1000 MG Caps Take 1,000 mg by mouth daily.   gabapentin 300 MG capsule Commonly known as: NEURONTIN Take 300 mg by mouth daily.   hydrochlorothiazide 25 MG tablet Commonly known as: HYDRODIURIL Take 1 tablet (25 mg total) by mouth daily.   Livalo 2 MG Tabs Generic drug: Pitavastatin Calcium Take 1 tablet (2 mg total) by mouth daily.   metoprolol tartrate 25 MG tablet Commonly known as: LOPRESSOR Take 1 tablet (25  mg total) by mouth 2 (two) times daily.   MULTIVITAMIN PO Take 1 tablet by mouth daily.   nitroGLYCERIN 0.4 MG SL tablet Commonly known as: NITROSTAT Place 0.4 mg under the tongue every 5 (five) minutes as needed for chest pain.   potassium chloride SA 20 MEQ tablet Commonly known as: KLOR-CON TAKE ONE TABLET BY MOUTH DAILY.        Objective:   BP 125/76   Pulse 97   Ht '5\' 10"'  (1.778 m)   Wt 173 lb (78.5 kg)   SpO2 97%   BMI 24.82 kg/m   Wt Readings from Last 3 Encounters:  03/04/21 173 lb (78.5 kg)  01/11/21 175 lb  (79.4 kg)  12/08/20 175 lb (79.4 kg)    Physical Exam Vitals and nursing note reviewed.  Constitutional:      General: He is not in acute distress.    Appearance: He is well-developed. He is not diaphoretic.  Eyes:     General: No scleral icterus.    Conjunctiva/sclera: Conjunctivae normal.  Neck:     Thyroid: No thyromegaly.  Cardiovascular:     Rate and Rhythm: Normal rate and regular rhythm.     Heart sounds: Normal heart sounds. No murmur heard.   Pulmonary:     Effort: Pulmonary effort is normal. No respiratory distress.     Breath sounds: Normal breath sounds. No wheezing.  Musculoskeletal:        General: Normal range of motion.     Cervical back: Neck supple.  Lymphadenopathy:     Cervical: No cervical adenopathy.  Skin:    General: Skin is warm and dry.     Findings: No rash.  Neurological:     Mental Status: He is alert and oriented to person, place, and time.     Coordination: Coordination normal.  Psychiatric:        Behavior: Behavior normal.       Assessment & Plan:   Problem List Items Addressed This Visit      Cardiovascular and Mediastinum   Essential hypertension - Primary   Relevant Medications   hydrochlorothiazide (HYDRODIURIL) 25 MG tablet   metoprolol tartrate (LOPRESSOR) 25 MG tablet   LIVALO 2 MG TABS   Other Relevant Orders   CBC with Differential/Platelet   CMP14+EGFR   Lipid panel     Other   Hyperlipidemia   Relevant Medications   hydrochlorothiazide (HYDRODIURIL) 25 MG tablet   metoprolol tartrate (LOPRESSOR) 25 MG tablet   LIVALO 2 MG TABS   Other Relevant Orders   CBC with Differential/Platelet   CMP14+EGFR   Lipid panel    Other Visit Diagnoses    Prediabetes       Relevant Orders   Bayer DCA Hb A1c Waived   Lipid panel   Smokes less than 1/2 pack a day with greater than 30 pack year history       Relevant Orders   CT Chest W Contrast      Patient also complains of ED and I said we will discuss that further in  future, will check blood work and see how things were. Follow up plan: Return if symptoms worsen or fail to improve, for 3 to 26-monthfollow-up depending on blood work.  Counseling provided for all of the vaccine components Orders Placed This Encounter  Procedures  . CT Chest W Contrast  . Bayer DCA Hb A1c Waived  . CBC with Differential/Platelet  . CMP14+EGFR  . Lipid panel  Caryl Pina, [name] Foster [facility] 03/04/2021, 2:23 PM

## 2021-03-05 LAB — CMP14+EGFR
ALT: 25 IU/L (ref 0–44)
AST: 14 IU/L (ref 0–40)
Albumin/Globulin Ratio: 2 (ref 1.2–2.2)
Albumin: 4.7 g/dL (ref 3.8–4.8)
Alkaline Phosphatase: 105 IU/L (ref 44–121)
BUN/Creatinine Ratio: 15 (ref 10–24)
BUN: 18 mg/dL (ref 8–27)
Bilirubin Total: 0.3 mg/dL (ref 0.0–1.2)
CO2: 22 mmol/L (ref 20–29)
Calcium: 9.4 mg/dL (ref 8.6–10.2)
Chloride: 105 mmol/L (ref 96–106)
Creatinine, Ser: 1.19 mg/dL (ref 0.76–1.27)
Globulin, Total: 2.4 g/dL (ref 1.5–4.5)
Glucose: 93 mg/dL (ref 65–99)
Potassium: 4.1 mmol/L (ref 3.5–5.2)
Sodium: 142 mmol/L (ref 134–144)
Total Protein: 7.1 g/dL (ref 6.0–8.5)
eGFR: 69 mL/min/{1.73_m2} (ref >59)

## 2021-03-05 LAB — LIPID PANEL
Chol/HDL Ratio: 3.9 ratio (ref 0.0–5.0)
Cholesterol, Total: 136 mg/dL (ref 100–199)
HDL: 35 mg/dL — ABNORMAL LOW (ref >39)
LDL Chol Calc (NIH): 81 mg/dL (ref 0–99)
Triglycerides: 105 mg/dL (ref 0–149)
VLDL Cholesterol Cal: 20 mg/dL (ref 5–40)

## 2021-03-05 LAB — CBC WITH DIFFERENTIAL/PLATELET
Basophils Absolute: 0.1 10*3/uL (ref 0.0–0.2)
Basos: 1 %
EOS (ABSOLUTE): 0.1 10*3/uL (ref 0.0–0.4)
Eos: 2 %
Hematocrit: 44.8 % (ref 37.5–51.0)
Hemoglobin: 14.8 g/dL (ref 13.0–17.7)
Immature Grans (Abs): 0 10*3/uL (ref 0.0–0.1)
Immature Granulocytes: 0 %
Lymphocytes Absolute: 2.4 10*3/uL (ref 0.7–3.1)
Lymphs: 30 %
MCH: 26.4 pg — ABNORMAL LOW (ref 26.6–33.0)
MCHC: 33 g/dL (ref 31.5–35.7)
MCV: 80 fL (ref 79–97)
Monocytes Absolute: 0.7 10*3/uL (ref 0.1–0.9)
Monocytes: 10 %
Neutrophils Absolute: 4.4 10*3/uL (ref 1.4–7.0)
Neutrophils: 57 %
Platelets: 231 10*3/uL (ref 150–450)
RBC: 5.61 x10E6/uL (ref 4.14–5.80)
RDW: 13.6 % (ref 11.6–15.4)
WBC: 7.8 10*3/uL (ref 3.4–10.8)

## 2021-03-06 ENCOUNTER — Other Ambulatory Visit: Payer: Self-pay | Admitting: Family Medicine

## 2021-03-06 DIAGNOSIS — I10 Essential (primary) hypertension: Secondary | ICD-10-CM

## 2021-03-08 ENCOUNTER — Other Ambulatory Visit: Payer: Self-pay | Admitting: Family Medicine

## 2021-03-08 ENCOUNTER — Telehealth: Payer: Self-pay

## 2021-03-08 DIAGNOSIS — I10 Essential (primary) hypertension: Secondary | ICD-10-CM

## 2021-03-08 MED ORDER — POTASSIUM CHLORIDE CRYS ER 20 MEQ PO TBCR: 20.0000 meq | EXTENDED_RELEASE_TABLET | Freq: Every day | ORAL | 3 refills | Status: DC

## 2021-03-08 MED ORDER — MELOXICAM 15 MG PO TABS: 15.0000 mg | ORAL_TABLET | Freq: Every day | ORAL | 3 refills | Status: DC

## 2021-03-08 NOTE — Addendum Note (Signed)
Addended by: Alphonzo Dublin on: 03/08/2021 03:04 PM   Modules accepted: Orders

## 2021-03-08 NOTE — Telephone Encounter (Signed)
Patient's potassium is fine so unless he has already been taking it consistently I do not think he needs it.  I sent the meloxicam for him

## 2021-03-08 NOTE — Addendum Note (Signed)
Addended by: Caryl Pina on: 03/08/2021 02:55 PM   Modules accepted: Orders

## 2021-03-08 NOTE — Addendum Note (Signed)
Addended by: Caryl Pina on: 03/08/2021 04:05 PM   Modules accepted: Orders

## 2021-03-08 NOTE — Telephone Encounter (Signed)
Pt would like refills of Potassium and Meloxicam sent to [facility].  He takes Meloxicam for bilateral shoulder and right elbow pain.  States that Dettinger prescribed Meloxicam when he was a pt with him years ago

## 2021-03-08 NOTE — Telephone Encounter (Signed)
Pt states that he has been taking Potassium for 2 years. States that Shelah Lewandowsky started him on it.

## 2021-03-09 ENCOUNTER — Telehealth: Payer: Self-pay

## 2021-03-09 NOTE — Telephone Encounter (Signed)
Key: [id number] - Rx #: [id number]  PA initiated through [employer] today for Livalo  Pt is currently taking Metoprolo and HCTZ

## 2021-03-10 ENCOUNTER — Other Ambulatory Visit: Payer: Self-pay | Admitting: Family Medicine

## 2021-03-10 DIAGNOSIS — F17210 Nicotine dependence, cigarettes, uncomplicated: Secondary | ICD-10-CM

## 2021-03-10 NOTE — Progress Notes (Unsigned)
Ordered low dose ct

## 2021-03-17 NOTE — Telephone Encounter (Signed)
livalo denied - not covered by plan  Alt meds: Atorvastatin, lovastatin, rosuvastatin, simvastatin

## 2021-03-18 NOTE — Telephone Encounter (Signed)
Please asked the patient but I think he is probably already tried some of these other ones, ask him if he is ever tried Crestor or Lipitor.  Oriented the others.

## 2021-03-18 NOTE — Telephone Encounter (Signed)
Pt used his [employer] card and has already picked up the medication.

## 2021-03-22 ENCOUNTER — Other Ambulatory Visit (HOSPITAL_COMMUNITY): Payer: Self-pay

## 2021-03-22 ENCOUNTER — Encounter (HOSPITAL_COMMUNITY): Payer: Self-pay

## 2021-03-22 DIAGNOSIS — Z87891 Personal history of nicotine dependence: Secondary | ICD-10-CM

## 2021-03-22 DIAGNOSIS — Z122 Encounter for screening for malignant neoplasm of respiratory organs: Secondary | ICD-10-CM

## 2021-03-22 NOTE — Progress Notes (Signed)
Received referral for initial lung cancer screening scan. Contacted patient and obtained smoking history (started age 65 smoking 1/2PPD , current smoker smoking 1/2PPD, 23 pack year) as well as answering questions related to the screening process. Patient denies signs/symptoms of lung cancer such as weight loss or hemoptysis. Patient denies comorbidity that would prevent curative treatment if lung cancer were to be found. Patient to be scheduled SDMV and LDCT.

## 2021-04-01 ENCOUNTER — Ambulatory Visit (HOSPITAL_COMMUNITY): Payer: BC Managed Care – PPO | Admitting: Physician Assistant

## 2021-04-01 ENCOUNTER — Ambulatory Visit (HOSPITAL_COMMUNITY): Payer: BC Managed Care – PPO

## 2021-04-05 ENCOUNTER — Telehealth: Payer: Self-pay | Admitting: Family Medicine

## 2021-04-05 DIAGNOSIS — E782 Mixed hyperlipidemia: Secondary | ICD-10-CM

## 2021-04-05 NOTE — Progress Notes (Signed)
[name] Foster.  04/05/21   LUNG CANCER SCREENING SHARED DECISION-MAKING VISIT - Age: 65 y.o.  - Pack year smoking history: 22.5 (0.5 PPD x 45 years)  - Type of tobacco abuse: Cigarettes - Current smoker or < 15 years of cessation: Current every day smoker - No current symptoms of lung cancer:   Patient denies any hemoptysis, unintentional weight loss, and unexplained cough - Risks and benefits of lung cancer screening discussed: . Negative: Over-diagnosis, radiation exposure, false positives, and additional testing . Positive: Discover early stage lung cancer resulting in higher incidence of cure - Patient educated regarding the importance of adherence to continued lung cancer screening. - Currently, there are no co-morbidities to prevent treatment to therapy for lung cancer and the patient is agreeable to pursue treatment if a malignancy is discovered.  [place] Preventative Services Task Force recommend annual screening for lung cancer with low-dose CT in adults aged 37 - 42 years who have a 20+ pack year smoking history and currently smoke or have quit smoking within the past 15 years.  Screening should be discontinued once a person has not smoked for 15 years or develops a health problem that substantially limits life expectancy or the ability or willingness to have curative lung surgery.  It is a category B recommendation.  Similar stances are provided by CMS, NCCN, and AATS.  Social History   Tobacco Use  . Smoking status: Current Every Day Smoker    Packs/day: 1.00    Years: 28.00    Pack years: 28.00    Types: Cigarettes  . Smokeless tobacco: Never Used  . Tobacco comment: pack will last 3 days   Vaping Use  . Vaping Use: Never used  Substance Use Topics  . Alcohol use: No  . Drug use: No     Personal history of tobacco use presenting hazards to health: - This patient meets criteria for low-dose CT lung cancer screening  - The shared decision making visit discussion included  risks and benefits of screening, potential for follow-up, diagnostic testing for abnormal scans, potential for false positive tests, overdiagnosis, discussion about total radiation exposure - Patient stated willingness to undergo diagnostics and treatment as needed - Patient was counseled on smoking cessation to decrease the  risk of lung cancer, pulmonary disease, heart disease, and stroke - Patient has been referred to Lung Cancer Screening Nurse Coordinator for further scheduling of LDCT and for further resources regarding free nicotine replacement therapy and information about smoking cessation classes - Counseling on the importance of adherence to annual lung cancer LDCT screening, impact of co-morbidities, and ability or willingness to undergo diagnosis and treatment is imperative for compliance of the program. - Counseling on the importance of continued smoking cessation for former smokers; the importance of smoking cessation for current smokers, and information about tobacco cessation interventions have been given to patient including [place] and 1-800-quit [place] programs.  Yearly follow up will be coordinated by Adonis Huguenin RN, MSN ([place]).  Harriett Rush, PA-C  04/06/21 11:34 AM

## 2021-04-05 NOTE — Telephone Encounter (Signed)
Patient's Livalo is a 90 day supply that we have sent to [facility].  Please address the Sildenafil question.

## 2021-04-06 ENCOUNTER — Ambulatory Visit (HOSPITAL_COMMUNITY)
Admission: RE | Admit: 2021-04-06 | Discharge: 2021-04-06 | Disposition: A | Discharge status: Home / Self Care | Payer: PPO | Source: Ambulatory Visit | Attending: Hematology | Admitting: Hematology

## 2021-04-06 ENCOUNTER — Inpatient Hospital Stay (HOSPITAL_COMMUNITY): Payer: BC Managed Care – PPO | Attending: Physician Assistant | Admitting: Physician Assistant

## 2021-04-06 ENCOUNTER — Other Ambulatory Visit: Payer: Self-pay

## 2021-04-06 DIAGNOSIS — Z87891 Personal history of nicotine dependence: Secondary | ICD-10-CM | POA: Insufficient documentation

## 2021-04-06 DIAGNOSIS — F17210 Nicotine dependence, cigarettes, uncomplicated: Secondary | ICD-10-CM

## 2021-04-06 DIAGNOSIS — Z122 Encounter for screening for malignant neoplasm of respiratory organs: Secondary | ICD-10-CM | POA: Insufficient documentation

## 2021-04-06 NOTE — Patient Instructions (Signed)
You were seen today for your Lung Cancer Screening shared decision making visit and low-dose CT scan.

## 2021-04-07 ENCOUNTER — Other Ambulatory Visit: Payer: Self-pay | Admitting: Family Medicine

## 2021-04-07 ENCOUNTER — Encounter (HOSPITAL_COMMUNITY): Payer: Self-pay

## 2021-04-07 MED ORDER — LIVALO 2 MG PO TABS: 2.0000 mg | ORAL_TABLET | Freq: Every day | ORAL | 3 refills | Status: DC

## 2021-04-07 MED ORDER — SILDENAFIL CITRATE 20 MG PO TABS: 20.0000 mg | ORAL_TABLET | ORAL | 1 refills | Status: DC | PRN

## 2021-04-07 NOTE — Telephone Encounter (Signed)
Patient called back stating he wants both medications called into [facility] for a 90 day supply.

## 2021-04-07 NOTE — Telephone Encounter (Signed)
I printed prescription for the sildenafil, he cannot take sildenafil and nitro on the same day, just let him know.

## 2021-04-07 NOTE — Progress Notes (Signed)
Patient notified of LDCT Lung Cancer Screening Results via mail with the recommendation to follow-up in 12 months. Patient's referring provider has been sent a copy of results. Results are as follows:  IMPRESSION: 1. Lung-RADS 2, benign appearance or behavior. Continue annual screening with low-dose chest CT without contrast in 12 months. 2. Emphysema (ICD10-J43.9).

## 2021-04-07 NOTE — Telephone Encounter (Signed)
Waiting on provider to address about sildenafil.

## 2021-04-07 NOTE — Telephone Encounter (Signed)
Pt made aware that the prescription is ready and to not take Nitro on the same day as the sildenafil. Pt would like for it to be faxed instead of coming by to pick it up since he lives far away.  $45 for cholesterol pill. Needs 90 day supply to help with cost. Rx was sent on 6/8

## 2021-04-13 ENCOUNTER — Telehealth: Payer: Self-pay | Admitting: Family Medicine

## 2021-04-14 ENCOUNTER — Telehealth: Payer: Self-pay | Admitting: Family Medicine

## 2021-04-14 NOTE — Telephone Encounter (Signed)
Pt made aware and understood 

## 2021-04-14 NOTE — Telephone Encounter (Signed)
Patient has a few very tiny scattered spots in his lungs that we are going to keep an eye on, repeat in 1 year.  They are so small that they could just be scar tissue but will recheck in a year to make sure they are not growing.  Otherwise the scan shows that he does have COPD.

## 2021-04-26 ENCOUNTER — Other Ambulatory Visit: Payer: Self-pay | Admitting: Family Medicine

## 2021-05-26 ENCOUNTER — Encounter: Payer: Self-pay | Admitting: Family Medicine

## 2021-05-26 ENCOUNTER — Ambulatory Visit (INDEPENDENT_AMBULATORY_CARE_PROVIDER_SITE_OTHER): Payer: PPO | Admitting: Family Medicine

## 2021-05-26 ENCOUNTER — Other Ambulatory Visit: Payer: Self-pay

## 2021-05-26 DIAGNOSIS — S161XXA Strain of muscle, fascia and tendon at neck level, initial encounter: Secondary | ICD-10-CM

## 2021-05-26 MED ORDER — CYCLOBENZAPRINE HCL 5 MG PO TABS: 5.0000 mg | ORAL_TABLET | Freq: Three times a day (TID) | ORAL | 0 refills | Status: DC | PRN

## 2021-05-26 NOTE — Patient Instructions (Signed)
Cervical Sprain A cervical sprain is a stretch or tear in one or more of the ligaments in the neck. Ligaments are the tissues that connect bones. Cervical sprains can range from mild to severe. Severe cervical sprains can cause the spinal bones (vertebrae) in the neck to be unstable. This can result in spinal cord damage and in serious nervous system problems. The time that it takes for a cervical sprain to heal depends on the cause and extent of the injury. Most cervical sprains heal in 4-6 weeks. What are the causes? Cervical sprains may be caused by trauma, such as an injury from a motor vehicle accident, a fall, or a sudden forward and backward whipping movement of the head and neck (whiplash injury). Mild cervical sprains may be caused by wear and tear over time. What increases the risk? The following factors may make you more likely to develop this condition: Participating in activities that have a high risk of trauma to the neck. These include contact sports, auto racing, gymnastics, and diving. Taking risks when driving or riding in a motor vehicle. Osteoarthritis of the spine. Poor strength and flexibility of the neck. A previous neck injury. Poor posture. Spending long periods in certain positions that put stress on the neck, such as sitting at a computer for a long time. What are the signs or symptoms? Symptoms of this condition include: Pain, soreness, stiffness, tenderness, swelling, or a burning sensation in the front, back, or sides of the neck, shoulders, or upper back. Sudden tightening of neck muscles (spasms). Limited ability to move the neck. Headache. Dizziness. Nausea or vomiting. Weakness, numbness, or tingling in a hand or an arm. Symptoms may develop right away after injury, or they may develop over a few days. In some cases, symptoms may go away with treatment and return (recur) over time. How is this diagnosed? This condition may be diagnosed based on: Your  medical history. Your symptoms. Any recent injuries or known neck problems that you have, such as arthritis in the neck. A physical exam. Imaging tests, such as X-rays, MRI, and CT scan. How is this treated? This condition is treated by resting and icing the injured area and doing physical therapy exercises. Heat therapy may be used 2-3 days after the injury occurred if there is no swelling. Depending on the severity of your condition, treatment may also include: Keeping your neck in place (immobilized) for periods of time. This may be done using: A cervical collar. This supports your chin and the back of your head. A cervical traction device. This is a sling that holds up your head. The device removes weight and pressure from your neck, and it may help to relieve pain. Medicines that help to relieve pain and inflammation. Medicines that help to relax your muscles (muscle relaxants). Surgery. This is rare. Follow these instructions at home: Medicines  Take over-the-counter and prescription medicines only as told by your health care provider. Ask your health care provider if the medicine prescribed to you: Requires you to avoid driving or using heavy machinery. Can cause constipation. You may need to take these actions to prevent or treat constipation: Drink enough fluid to keep your urine pale yellow. Take over-the-counter or prescription medicines. Eat foods that are high in fiber, such as beans, whole grains, and fresh fruits and vegetables. Limit foods that are high in fat and processed sugars, such as fried or sweet foods. If you have a cervical collar: Wear the collar as told by your   health care provider. Do not remove it unless told. Ask before making any adjustments to your collar. If you have long hair, keep it outside of the collar. Ask your health care provider if you may remove the collar for cleaning and bathing. If so: Follow instructions about how to remove it  safely. Clean it by hand with mild soap and water and air-dry it completely. If your collar has removable pads, remove them every 1-2 days and wash them by hand with soap and water. Let them air-dry completely before putting them back in the collar. Tell your health care provider if your skin under the collar has irritation or sores. Managing pain, stiffness, and swelling   If directed, use a cervical traction device as told. If directed, put ice on the affected area. To do this: Put ice in a plastic bag. Place a towel between your skin and the bag. Leave the ice on for 20 minutes, 2-3 times a day. If directed, apply heat to the affected area before you do your physical therapy or as often as told by your health care provider. Use the heat source that your health care provider recommends, such as a moist heat pack or a heating pad. Place a towel between your skin and the heat source. Leave the heat on for 20-30 minutes. Remove the heat if your skin turns bright red. This is especially important if you are unable to feel pain, heat, or cold. You may have a greater risk of getting burned. Activity Do not drive while wearing a cervical collar. If you do not have a cervical collar, ask if it is safe to drive while your neck heals. Do not lift anything that is heavier than 10 lb (4.5 kg), or the limit that you are told, until your health care provider says that it is safe. Rest as told by your health care provider. If physical therapy was prescribed, do exercises as told by your health care provider or physical therapist. Return to your normal activities as told by your health care provider. Avoid positions and activities that make your symptoms worse. Ask your health care provider what activities are safe for you. General instructions Do not use any products that contain nicotine or tobacco, such as cigarettes, e-cigarettes, and chewing tobacco. These can delay healing. If you need help quitting,  ask your health care provider. Keep all follow-up visits as told by your health care provider or physical therapist. This is important. How is this prevented? To prevent a cervical sprain from happening again: Use and maintain good posture. Make any needed adjustments to your workstation to help you do this. Exercise regularly as told by your health care provider or physical therapist. Avoid risky activities that may cause a cervical sprain. Contact a health care provider if you have: Symptoms that get worse or do not get better after 2 weeks of treatment. Pain that gets worse or does not get better with medicine. New, unexplained symptoms. Sores or irritated skin on your neck from wearing your cervical collar. Get help right away if: You have severe pain. You develop numbness, tingling, or weakness in any part of your body. You cannot move a part of your body (you have paralysis). You have neck pain along with severe dizziness or headache. Summary A cervical sprain is a stretch or tear in one or more of the ligaments in the neck. Cervical sprains may be caused by trauma, such as an injury from a motor vehicle accident, a   fall, or a sudden forward and backward whipping movement of the head and neck (whiplash injury). Symptoms may develop right away after injury, or they may develop over a few days. This condition may be treated with rest, ice, heat, medicines, physical therapy, and surgery. This information is not intended to replace advice given to you by your health care provider. Make sure you discuss any questions you have with your health care provider. Document Revised: 06/26/2019 Document Reviewed: 06/26/2019 Elsevier Patient Education  2022 [employer]  
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
## 2021-05-31 ENCOUNTER — Ambulatory Visit (INDEPENDENT_AMBULATORY_CARE_PROVIDER_SITE_OTHER): Payer: PPO

## 2021-05-31 VITALS — Ht 70.0 in | Wt 178.0 lb

## 2021-05-31 DIAGNOSIS — Z0000 Encounter for general adult medical examination without abnormal findings: Secondary | ICD-10-CM

## 2021-05-31 NOTE — Progress Notes (Signed)
Subjective:   Javontae Jayanth Tyburski. is a 65 y.o. male who presents for an Initial Medicare Annual Wellness Visit.  Virtual Visit via Telephone Note  I connected with  Oxley Sarni. on 05/31/21 at  4:15 PM EDT by telephone and verified that I am speaking with the correct person using two identifiers.  Location: Patient: Home Provider: [facility] Persons participating in the virtual visit: patient/Nurse Health Advisor   I discussed the limitations, risks, security and privacy concerns of performing an evaluation and management service by telephone and the availability of in person appointments. The patient expressed understanding and agreed to proceed.  Interactive audio and video telecommunications were attempted between this nurse and patient, however failed, due to patient having technical difficulties OR patient did not have access to video capability.  We continued and completed visit with audio only.  Some vital signs may be absent or patient reported.   Lataysha Vohra E Anikin Prosser, LPN   Review of Systems     Cardiac Risk Factors include: advanced age (>36mn, >>58women);dyslipidemia;hypertension;male gender;sedentary lifestyle     Objective:    Today's Vitals   05/31/21 1620  Weight: 178 lb (80.7 kg)  Height: '5\' 10"'$  (1.778 m)   Body mass index is 25.54 kg/m.  Advanced Directives 05/31/2021 01/11/2021 04/10/2020 04/08/2020 09/12/2016 07/01/2016 06/29/2016  Does Patient Have a Medical Advance Directive? No No Yes No Yes Yes Yes  Type of Advance Directive - - - - Living will Living will Living will  Does patient want to make changes to medical advance directive? - - - - No - Patient declined No - Patient declined No - Patient declined  Copy of [place] Chart? - - - - No - copy requested No - copy requested No - copy requested  Would patient like information on creating a medical advance directive? No - Patient declined No - Patient declined - No - Patient declined - - -     Current Medications (verified) Outpatient Encounter Medications as of 05/31/2021  Medication Sig   cyclobenzaprine (FLEXERIL) 5 MG tablet Take 1 tablet (5 mg total) by mouth 3 (three) times daily as needed for muscle spasms.   gabapentin (NEURONTIN) 300 MG capsule Take 300 mg by mouth daily.   hydrochlorothiazide (HYDRODIURIL) 25 MG tablet Take 1 tablet (25 mg total) by mouth daily.   LIVALO 2 MG TABS Take 1 tablet (2 mg total) by mouth daily.   meloxicam (MOBIC) 15 MG tablet Take 1 tablet (15 mg total) by mouth daily.   metoprolol tartrate (LOPRESSOR) 25 MG tablet Take 1 tablet (25 mg total) by mouth 2 (two) times daily.   Multiple Vitamins-Minerals (MULTIVITAMIN PO) Take 1 tablet by mouth daily.   nitroGLYCERIN (NITROSTAT) 0.4 MG SL tablet DISSOLVE ONE TABLET UNDER TONGUE EVERY 5 MINUTES UP TO 3 DOSES AS NEEDED FOR CHEST PAIN (Patient not taking: Reported on 05/26/2021)   Omega-3 Fatty Acids (FISH OIL) 1000 MG CAPS Take 1,000 mg by mouth daily.   potassium chloride SA (KLOR-CON) 20 MEQ tablet Take 1 tablet (20 mEq total) by mouth daily.   sildenafil (REVATIO) 20 MG tablet Take 1-3 tablets (20-60 mg total) by mouth as needed.   No facility-administered encounter medications on file as of 05/31/2021.    Allergies (verified) Patient has no known allergies.   History: Past Medical History:  Diagnosis Date   Abnormal EKG    Apical variant hypertrophic cardiomyopathy ([facility])    Benign essential hypertension  BPH (benign prostatic hyperplasia)    Hypokalemia    Tobacco abuse    Past Surgical History:  Procedure Laterality Date   CARDIAC CATHETERIZATION     2008   COLONOSCOPY N/A 02/26/2015   Procedure: COLONOSCOPY;  Surgeon: Rogene Houston, MD;  [facility];  Service: Endoscopy;  Laterality: N/A;  830   COLONOSCOPY WITH PROPOFOL N/A 04/10/2020   Procedure: COLONOSCOPY WITH PROPOFOL;  Surgeon: Rogene Houston, MD;  [facility];  Service: Endoscopy;   Laterality: N/A;  [place] Right 01/13/2021   Procedure: HERNIA REPAIR INGUINAL ADULT;  Surgeon: Aviva Signs, MD;  [facility];  Service: General;  Laterality: Right;   KIDNEY SURGERY Right April 2015   benign tumor removal   POLYPECTOMY  04/10/2020   Procedure: POLYPECTOMY;  Surgeon: Rogene Houston, MD;  [facility];  Service: Endoscopy;;  proximal transverse colon(cs); transverse colon polyp (cbiopsy);sigmoid colon polyp(cb);   PROSTATE ABLATION  12-2015 at [place] Left 01/18/2016   Procedure: SHOULDER ACROMIOPLASTY;  Surgeon: Milly Jakob, MD;  Location: [place];  Service: Orthopedics;  Laterality: Left;   SHOULDER ARTHROSCOPY WITH ROTATOR CUFF REPAIR AND SUBACROMIAL DECOMPRESSION Left 01/18/2016   Procedure: LEFT SHOULDER ARTHROSCOPY WITH ROTATOR CUFF REPAIR AND SUBACROMIAL DECOMPRESSION;  Surgeon: Milly Jakob, MD;  Location: [place];  Service: Orthopedics;  Laterality: Left;  PRE-OP BLOCK WITH GENERAL ANESTHESIA   Family History  Problem Relation Age of Onset   Diabetes Mother    Diabetes Other    Hypertension Other    Social History   Socioeconomic History   Marital status: Divorced    Spouse name: Not on file   Number of children: 1   Years of education: Not on file   Highest education level: Not on file  Occupational History   Occupation: Full time Personal assistant: [employer]   Occupation: disability  Tobacco Use   Smoking status: Every Day    Packs/day: 0.25    Years: 28.00    Pack years: 7.00    Types: Cigarettes   Smokeless tobacco: Never   Tobacco comments:    pack will last 4 days now - trying to quit 05/31/21  Vaping Use   Vaping Use: Never used  Substance and Sexual Activity   Alcohol use: No   Drug use: No   Sexual activity: Not on file  Other Topics Concern   Not on file  Social History Narrative   Divorced   05/31/2021 - has a 32 year old  son who lives with his mother, but he visits him every evening.   Social Determinants of Health   Financial Resource Strain: Low Risk    Difficulty of Paying Living Expenses: Not hard at all  Food Insecurity: No Food Insecurity   Worried About Charity fundraiser in the Last Year: Never true   [place] in the Last Year: Never true  Transportation Needs: No Transportation Needs   Lack of Transportation (Medical): No   Lack of Transportation (Non-Medical): No  Physical Activity: Inactive   Days of Exercise per Week: 0 days   Minutes of Exercise per Session: 0 min  Stress: No Stress Concern Present   Feeling of Stress : Not at all  Social Connections: Moderately Integrated   Frequency of Communication with Friends and Family: More than three times a week   Frequency  of Social Gatherings with Friends and Family: More than three times a week   Attends Religious Services: More than 4 times per year   Active Member of [employer] or Organizations: Yes   Attends Music therapist: More than 4 times per year   Marital Status: Divorced    Tobacco Counseling Ready to quit: Yes Counseling given: Yes Tobacco comments: pack will last 4 days now - trying to quit 05/31/21   Clinical Intake:  Pre-visit preparation completed: Yes  Pain : No/denies pain     BMI - recorded: 25.54 Nutritional Status: BMI 25 -29 Overweight Nutritional Risks: None Diabetes: No  How often do you need to have someone help you when you read instructions, pamphlets, or other written materials from your doctor or pharmacy?: 1 - Never  Diabetic? No  Interpreter Needed?: No  Information entered by :: Rosamary Boudreau, LPN   Activities of Daily Living In your present state of health, do you have any difficulty performing the following activities: 05/31/2021 01/11/2021  Hearing? N N  Vision? N N  Difficulty concentrating or making decisions? N N  Walking or climbing stairs? N N  Dressing or bathing? N  N  Doing errands, shopping? N N  Preparing Food and eating ? N -  Using the Toilet? N -  In the past six months, have you accidently leaked urine? N -  Do you have problems with loss of bowel control? N -  Managing your Medications? N -  Managing your Finances? N -  Housekeeping or managing your Housekeeping? N -  Some recent data might be hidden    Patient Care Team: Dettinger, Fransisca Kaufmann, MD as PCP - General (Family Medicine)  Indicate any recent Medical Services you may have received from other than [facility] providers in the past year (date may be approximate).     Assessment:   This is a routine wellness examination for Cassiel.  Hearing/Vision screen Hearing Screening - Comments:: Denies hearing difficulties  Vision Screening - Comments:: Denies vision difficulties   Dietary issues and exercise activities discussed: Current Exercise Habits: Home exercise routine, Type of exercise: walking;Other - see comments;stretching (bike riding), Time (Minutes): 20, Frequency (Times/Week): 3, Weekly Exercise (Minutes/Week): 60, Intensity: Mild, Exercise limited by: None identified   Goals Addressed             This Visit's Progress    Exercise 3x per week (30 min per time)       Quit Smoking         Depression Screen PHQ 2/9 Scores 05/31/2021 05/26/2021 03/04/2021 02/12/2018 11/15/2017 02/06/2017 08/26/2016  PHQ - 2 Score 0 0 0 1 0 0 0  PHQ- 9 Score 4 0 - - - - -    Fall Risk Fall Risk  05/31/2021 05/26/2021 03/04/2021 12/08/2020  Falls in the past year? 0 0 0 0  Number falls in past yr: 0 - - -  Injury with Fall? 0 - - -  Risk for fall due to : No Fall Risks - - -  Follow up Falls prevention discussed Falls evaluation completed - Falls evaluation completed    [place]:  Any stairs in or around the home? No  If so, are there any without handrails? No  Home free of loose throw rugs in walkways, pet beds, electrical cords, etc? Yes  Adequate lighting in  your home to reduce risk of falls? Yes   ASSISTIVE DEVICES UTILIZED TO PREVENT FALLS:  Life alert? No  Use of a cane, walker or w/c? No  Grab bars in the bathroom? Yes  Shower chair or bench in shower? No  Elevated toilet seat or a handicapped toilet? No   TIMED UP AND GO:  Was the test performed? No . Telephonic visit  Cognitive Function:     6CIT Screen 05/31/2021  What Year? 0 points  What month? 0 points  What time? 0 points  Count back from 20 0 points  Months in reverse 0 points  Repeat phrase 0 points  Total Score 0    Immunizations Immunization History  Administered Date(s) Administered   Influenza Inj Mdck Quad Pf 10/04/2017, 08/15/2018   Influenza Inj Mdck Quad With Preservative 07/26/2019   Influenza,inj,Quad PF,6+ Mos 08/11/2015   Influenza-Unspecified 10/04/2017, 08/15/2018   Moderna Sars-Covid-2 Vaccination 01/24/2020, 02/25/2020, 09/23/2020   Tdap 06/04/2013    TDAP status: Up to date  Flu Vaccine status: Up to date  Pneumococcal vaccine status: Due, Education has been provided regarding the importance of this vaccine. Advised may receive this vaccine at local pharmacy or Health Dept. Aware to provide a copy of the vaccination record if obtained from local pharmacy or Health Dept. Verbalized acceptance and understanding.  Covid-19 vaccine status: Completed vaccines  Qualifies for Shingles Vaccine? Yes   Zostavax completed No   Shingrix Completed?: No.    Education has been provided regarding the importance of this vaccine. Patient has been advised to call insurance company to determine out of pocket expense if they have not yet received this vaccine. Advised may also receive vaccine at local pharmacy or Health Dept. Verbalized acceptance and understanding.  Screening Tests Health Maintenance  Topic Date Due   Pneumococcal Vaccine 60-47 Years old (1 - PCV) Never done   Zoster Vaccines- Shingrix (1 of 2) Never done   COVID-19 Vaccine (4 - Booster for  Moderna series) 01/21/2021   INFLUENZA VACCINE  05/31/2021   TETANUS/TDAP  06/05/2023   COLONOSCOPY (Pts 45-66yr Insurance coverage will need to be confirmed)  04/10/2025   Hepatitis C Screening  Completed   HIV Screening  Completed   HPV VACCINES  Aged Out    Health Maintenance  Health Maintenance Due  Topic Date Due   Pneumococcal Vaccine 049660Years old (1 - PCV) Never done   Zoster Vaccines- Shingrix (1 of 2) Never done   COVID-19 Vaccine (4 - Booster for Moderna series) 01/21/2021   INFLUENZA VACCINE  05/31/2021    Colorectal cancer screening: Type of screening: Colonoscopy. Completed 04/10/2020. Repeat every 5 years  Lung Cancer Screening: (Low Dose CT Chest recommended if Age 63-80years, 30 pack-year currently smoking OR have quit w/in 15years.) does qualify.   Lung Cancer Screening Referral:  already done - last CT 02/2021  Additional Screening:  Hepatitis C Screening: does qualify; Completed 02/06/2017  Vision Screening: Recommended annual ophthalmology exams for early detection of glaucoma and other disorders of the eye. Is the patient up to date with their annual eye exam?  No  Who is the provider or what is the name of the office in which the patient attends annual eye exams? none If pt is not established with a provider, would they like to be referred to a provider to establish care? No .   Dental Screening: Recommended annual dental exams for proper oral hygiene  Community Resource Referral / Chronic Care Management: CRR required this visit?  No   CCM required this visit?  No  Plan:     I have personally reviewed and noted the following in the patient's chart:   Medical and social history Use of alcohol, tobacco or illicit drugs  Current medications and supplements including opioid prescriptions. Patient is not currently taking opioid prescriptions. Functional ability and status Nutritional status Physical activity Advanced directives List of  other physicians Hospitalizations, surgeries, and ER visits in previous 12 months Vitals Screenings to include cognitive, depression, and falls Referrals and appointments  In addition, I have reviewed and discussed with patient certain preventive protocols, quality metrics, and best practice recommendations. A written personalized care plan for preventive services as well as general preventive health recommendations were provided to patient.     Sandrea Hammond, LPN   [id number]   Nurse Notes: None

## 2021-05-31 NOTE — Patient Instructions (Signed)
[name] Foster , Thank you for taking time to come for your Medicare Wellness Visit. I appreciate your ongoing commitment to your health goals. Please review the following plan we discussed and let me know if I can assist you in the future.   Screening recommendations/referrals: Colonoscopy: Done 04/10/2020 - Repeat in 5 years Recommended yearly ophthalmology/optometry visit for glaucoma screening and checkup Recommended yearly dental visit for hygiene and checkup  Vaccinations: Influenza vaccine: Done 2021 - Repeat every fall Pneumococcal vaccine: Due. 2 vaccines one year apart Tdap vaccine: Done 06/04/2013 - Repeat in 10 years  Shingles vaccine: Due. Shingrix discussed. Please contact your pharmacy for coverage information.     Covid-19: Done 01/24/20, 02/25/20, 09/23/20  Advanced directives: Please bring a copy of your health care power of attorney and living will to the office to be added to your chart at your convenience.   Conditions/risks identified: Aim for 30 minutes of exercise or brisk walking each day, drink 6-8 glasses of water and eat lots of fruits and vegetables.   Next appointment: Follow up in one year for your annual wellness visit   Preventive Care 40-64 Years, Male Preventive care refers to lifestyle choices and visits with your health care provider that can promote health and wellness. What does preventive care include? A yearly physical exam. This is also called an annual well check. Dental exams once or twice a year. Routine eye exams. Ask your health care provider how often you should have your eyes checked. Personal lifestyle choices, including: Daily care of your teeth and gums. Regular physical activity. Eating a healthy diet. Avoiding tobacco and drug use. Limiting alcohol use. Practicing safe sex. Taking low-dose aspirin every day starting at age 55. What happens during an annual well check? The services and screenings done by your health care provider during  your annual well check will depend on your age, overall health, lifestyle risk factors, and family history of disease. Counseling  Your health care provider may ask you questions about your: Alcohol use. Tobacco use. Drug use. Emotional well-being. Home and relationship well-being. Sexual activity. Eating habits. Work and work Statistician. Screening  You may have the following tests or measurements: Height, weight, and BMI. Blood pressure. Lipid and cholesterol levels. These may be checked every 5 years, or more frequently if you are over 66 years old. Skin check. Lung cancer screening. You may have this screening every year starting at age 42 if you have a 30-pack-year history of smoking and currently smoke or have quit within the past 15 years. Fecal occult blood test (FOBT) of the stool. You may have this test every year starting at age 36. Flexible sigmoidoscopy or colonoscopy. You may have a sigmoidoscopy every 5 years or a colonoscopy every 10 years starting at age 49. Prostate cancer screening. Recommendations will vary depending on your family history and other risks. Hepatitis C blood test. Hepatitis B blood test. Sexually transmitted disease (STD) testing. Diabetes screening. This is done by checking your blood sugar (glucose) after you have not eaten for a while (fasting). You may have this done every 1-3 years. Discuss your test results, treatment options, and if necessary, the need for more tests with your health care provider. Vaccines  Your health care provider may recommend certain vaccines, such as: Influenza vaccine. This is recommended every year. Tetanus, diphtheria, and acellular pertussis (Tdap, Td) vaccine. You may need a Td booster every 10 years. Zoster vaccine. You may need this after age 71. Pneumococcal 13-valent conjugate (  PCV13) vaccine. You may need this if you have certain conditions and have not been vaccinated. Pneumococcal polysaccharide (PPSV23)  vaccine. You may need one or two doses if you smoke cigarettes or if you have certain conditions. Talk to your health care provider about which screenings and vaccines you need and how often you need them. This information is not intended to replace advice given to you by your health care provider. Make sure you discuss any questions you have with your health care provider. Document Released: 11/13/2015 Document Revised: 07/06/2016 Document Reviewed: 08/18/2015 Elsevier Interactive Patient Education  2017 [place] Prevention in the Home Falls can cause injuries. They can happen to people of all ages. There are many things you can do to make your home safe and to help prevent falls. What can I do on the outside of my home? Regularly fix the edges of walkways and driveways and fix any cracks. Remove anything that might make you trip as you walk through a door, such as a raised step or threshold. Trim any bushes or trees on the path to your home. Use bright outdoor lighting. Clear any walking paths of anything that might make someone trip, such as rocks or tools. Regularly check to see if handrails are loose or broken. Make sure that both sides of any steps have handrails. Any raised decks and porches should have guardrails on the edges. Have any leaves, snow, or ice cleared regularly. Use sand or salt on walking paths during winter. Clean up any spills in your garage right away. This includes oil or grease spills. What can I do in the bathroom? Use night lights. Install grab bars by the toilet and in the tub and shower. Do not use towel bars as grab bars. Use non-skid mats or decals in the tub or shower. If you need to sit down in the shower, use a plastic, non-slip stool. Keep the floor dry. Clean up any water that spills on the floor as soon as it happens. Remove soap buildup in the tub or shower regularly. Attach bath mats securely with double-sided non-slip rug tape. Do not  have throw rugs and other things on the floor that can make you trip. What can I do in the bedroom? Use night lights. Make sure that you have a light by your bed that is easy to reach. Do not use any sheets or blankets that are too big for your bed. They should not hang down onto the floor. Have a firm chair that has side arms. You can use this for support while you get dressed. Do not have throw rugs and other things on the floor that can make you trip. What can I do in the kitchen? Clean up any spills right away. Avoid walking on wet floors. Keep items that you use a lot in easy-to-reach places. If you need to reach something above you, use a strong step stool that has a grab bar. Keep electrical cords out of the way. Do not use floor polish or wax that makes floors slippery. If you must use wax, use non-skid floor wax. Do not have throw rugs and other things on the floor that can make you trip. What can I do with my stairs? Do not leave any items on the stairs. Make sure that there are handrails on both sides of the stairs and use them. Fix handrails that are broken or loose. Make sure that handrails are as long as the stairways. Check any  carpeting to make sure that it is firmly attached to the stairs. Fix any carpet that is loose or worn. Avoid having throw rugs at the top or bottom of the stairs. If you do have throw rugs, attach them to the floor with carpet tape. Make sure that you have a light switch at the top of the stairs and the bottom of the stairs. If you do not have them, ask someone to add them for you. What else can I do to help prevent falls? Wear shoes that: Do not have high heels. Have rubber bottoms. Are comfortable and fit you well. Are closed at the toe. Do not wear sandals. If you use a stepladder: Make sure that it is fully opened. Do not climb a closed stepladder. Make sure that both sides of the stepladder are locked into place. Ask someone to hold it for  you, if possible. Clearly mark and make sure that you can see: Any grab bars or handrails. First and last steps. Where the edge of each step is. Use tools that help you move around (mobility aids) if they are needed. These include: Canes. Walkers. Scooters. Crutches. Turn on the lights when you go into a dark area. Replace any light bulbs as soon as they burn out. Set up your furniture so you have a clear path. Avoid moving your furniture around. If any of your floors are uneven, fix them. If there are any pets around you, be aware of where they are. Review your medicines with your doctor. Some medicines can make you feel dizzy. This can increase your chance of falling. Ask your doctor what other things that you can do to help prevent falls. This information is not intended to replace advice given to you by your health care provider. Make sure you discuss any questions you have with your health care provider. Document Released: 08/13/2009 Document Revised: 03/24/2016 Document Reviewed: 11/21/2014 Elsevier Interactive Patient Education  2017 [employer].

## 2021-06-10 ENCOUNTER — Encounter: Payer: Self-pay | Admitting: Family Medicine

## 2021-06-10 ENCOUNTER — Ambulatory Visit (INDEPENDENT_AMBULATORY_CARE_PROVIDER_SITE_OTHER): Payer: PPO | Admitting: Family Medicine

## 2021-06-10 ENCOUNTER — Other Ambulatory Visit: Payer: Self-pay

## 2021-06-10 DIAGNOSIS — S161XXD Strain of muscle, fascia and tendon at neck level, subsequent encounter: Secondary | ICD-10-CM | POA: Diagnosis not present

## 2021-06-10 DIAGNOSIS — M62838 Other muscle spasm: Secondary | ICD-10-CM | POA: Diagnosis not present

## 2021-06-10 MED ORDER — CYCLOBENZAPRINE HCL 5 MG PO TABS
5.0000 mg | ORAL_TABLET | Freq: Three times a day (TID) | ORAL | 0 refills | Status: AC | PRN
Start: 2021-06-10 — End: 2021-06-20

## 2021-06-10 MED ORDER — METHYLPREDNISOLONE ACETATE 40 MG/ML IJ SUSP
60.0000 mg | Freq: Once | INTRAMUSCULAR | Status: AC
  Administered 2021-06-10: 60 mg via INTRAMUSCULAR
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
## 2021-06-17 ENCOUNTER — Other Ambulatory Visit: Payer: Self-pay

## 2021-06-17 ENCOUNTER — Encounter: Payer: Self-pay | Admitting: Family Medicine

## 2021-06-17 ENCOUNTER — Ambulatory Visit (INDEPENDENT_AMBULATORY_CARE_PROVIDER_SITE_OTHER): Payer: PPO | Admitting: Family Medicine

## 2021-06-17 VITALS — BP 120/70 | HR 83 | Ht 70.0 in | Wt 179.0 lb

## 2021-06-17 DIAGNOSIS — E782 Mixed hyperlipidemia: Secondary | ICD-10-CM | POA: Diagnosis not present

## 2021-06-17 DIAGNOSIS — I10 Essential (primary) hypertension: Secondary | ICD-10-CM | POA: Diagnosis not present

## 2021-06-17 DIAGNOSIS — S161XXD Strain of muscle, fascia and tendon at neck level, subsequent encounter: Secondary | ICD-10-CM | POA: Diagnosis not present

## 2021-06-17 DIAGNOSIS — R7303 Prediabetes: Secondary | ICD-10-CM | POA: Diagnosis not present

## 2021-06-17 DIAGNOSIS — M62838 Other muscle spasm: Secondary | ICD-10-CM | POA: Diagnosis not present

## 2021-06-17 LAB — BAYER DCA HB A1C WAIVED: HB A1C (BAYER DCA - WAIVED): 5.9 % (ref <7.0)

## 2021-06-17 LAB — BMP8+EGFR
BUN/Creatinine Ratio: 19 (ref 10–24)
BUN: 18 mg/dL (ref 8–27)
CO2: 22 mmol/L (ref 20–29)
Calcium: 9.6 mg/dL (ref 8.6–10.2)
Chloride: 102 mmol/L (ref 96–106)
Creatinine, Ser: 0.96 mg/dL (ref 0.76–1.27)
Glucose: 103 mg/dL — ABNORMAL HIGH (ref 65–99)
Potassium: 4.1 mmol/L (ref 3.5–5.2)
Sodium: 138 mmol/L (ref 134–144)
eGFR: 89 mL/min/{1.73_m2} (ref >59)
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
## 2021-06-22 DIAGNOSIS — M62838 Other muscle spasm: Secondary | ICD-10-CM | POA: Diagnosis not present

## 2021-06-22 DIAGNOSIS — S161XXD Strain of muscle, fascia and tendon at neck level, subsequent encounter: Secondary | ICD-10-CM | POA: Diagnosis not present

## 2021-06-25 DIAGNOSIS — S161XXD Strain of muscle, fascia and tendon at neck level, subsequent encounter: Secondary | ICD-10-CM | POA: Diagnosis not present

## 2021-06-25 DIAGNOSIS — M62838 Other muscle spasm: Secondary | ICD-10-CM | POA: Diagnosis not present

## 2021-06-30 DIAGNOSIS — S161XXD Strain of muscle, fascia and tendon at neck level, subsequent encounter: Secondary | ICD-10-CM | POA: Diagnosis not present

## 2021-06-30 DIAGNOSIS — M62838 Other muscle spasm: Secondary | ICD-10-CM | POA: Diagnosis not present

## 2021-07-13 ENCOUNTER — Other Ambulatory Visit: Payer: Self-pay | Admitting: Family Medicine

## 2021-07-19 ENCOUNTER — Ambulatory Visit (INDEPENDENT_AMBULATORY_CARE_PROVIDER_SITE_OTHER): Payer: PPO | Admitting: Nurse Practitioner

## 2021-07-19 DIAGNOSIS — U071 COVID-19: Secondary | ICD-10-CM | POA: Insufficient documentation

## 2021-07-19 MED ORDER — MOLNUPIRAVIR EUA 200MG CAPSULE: 4.0000 | ORAL_CAPSULE | Freq: Two times a day (BID) | ORAL | 0 refills | Status: AC

## 2021-07-19 NOTE — Progress Notes (Signed)
   Virtual Visit  Note Due to COVID-19 pandemic this visit was conducted virtually. This visit type was conducted due to national recommendations for restrictions regarding the COVID-19 Pandemic (e.g. social distancing, sheltering in place) in an effort to limit this patient's exposure and mitigate transmission in our community. All issues noted in this document were discussed and addressed.  A physical exam was not performed with this format.  I connected with [name] Foster. on 07/19/21 at 1:15 PM by telephone and verified that I am speaking with the correct person using two identifiers. [name] Foster. is currently located at home during visit. The provider, Ivy Lynn, NP is located in their office at time of visit.  I discussed the limitations, risks, security and privacy concerns of performing an evaluation and management service by telephone and the availability of in person appointments. I also discussed with the patient that there may be a patient responsible charge related to this service. The patient expressed understanding and agreed to proceed.   History and Present Illness:  URI  This is a new problem. The current episode started yesterday. The problem has been gradually worsening. There has been no fever. Associated symptoms include congestion, coughing and headaches. He has tried nothing for the symptoms.     Review of Systems  Constitutional:  Negative for chills and fever.  HENT:  Positive for congestion.   Respiratory:  Positive for cough.   Neurological:  Positive for headaches.  All other systems reviewed and are negative.   Observations/Objective: Televisit patient not in distress  Assessment and Plan: Positive for COVID-19.  Today patient on Molnupiravir antivirus, education provided to patient.  Increase hydration Tylenol/ibuprofen for pain and fever.  Monitor oxygen saturation.  Follow Up Instructions: Follow-up with unresolved symptoms    I discussed the  assessment and treatment plan with the patient. The patient was provided an opportunity to ask questions and all were answered. The patient agreed with the plan and demonstrated an understanding of the instructions.   The patient was advised to call back or seek an in-person evaluation if the symptoms worsen or if the condition fails to improve as anticipated.  The above assessment and management plan was discussed with the patient. The patient verbalized understanding of and has agreed to the management plan. Patient is aware to call the clinic if symptoms persist or worsen. Patient is aware when to return to the clinic for a follow-up visit. Patient educated on when it is appropriate to go to the emergency department.   Time call ended: 11:25 AM  I provided 10 minutes of  non face-to-face time during this encounter.    Ivy Lynn, NP

## 2021-07-20 ENCOUNTER — Encounter: Payer: Self-pay | Admitting: Nurse Practitioner

## 2021-07-20 NOTE — Assessment & Plan Note (Signed)
Positive for COVID-19.  Today patient on Molnupiravir antivirus, education provided to patient.  Increase hydration Tylenol/ibuprofen for pain and fever.  Monitor oxygen saturation.

## 2021-09-01 ENCOUNTER — Telehealth: Payer: Self-pay | Admitting: Family Medicine

## 2021-09-01 NOTE — Telephone Encounter (Signed)
Pt c/o a possible infected tooth. He would like for Dettinger to call in an antibiotic. Informed pt that insurance does not cover dental visits with PCP. Informed pt that it would be best to see his dentist. It will be cheaper and can do xrays and make sure what is going on. Pt understood. He will call his dentist tomorrow.

## 2021-09-08 ENCOUNTER — Encounter: Payer: Self-pay | Admitting: Nurse Practitioner

## 2021-09-08 ENCOUNTER — Ambulatory Visit (INDEPENDENT_AMBULATORY_CARE_PROVIDER_SITE_OTHER): Payer: PPO | Admitting: Nurse Practitioner

## 2021-09-08 DIAGNOSIS — J0101 Acute recurrent maxillary sinusitis: Secondary | ICD-10-CM | POA: Insufficient documentation

## 2021-09-08 MED ORDER — AMOXICILLIN-POT CLAVULANATE 875-125 MG PO TABS
1.0000 | ORAL_TABLET | Freq: Two times a day (BID) | ORAL | 0 refills | Status: DC
Start: 2021-09-08 — End: 2021-12-16

## 2021-09-08 NOTE — Progress Notes (Signed)
   Virtual Visit  Note Due to COVID-19 pandemic this visit was conducted virtually. This visit type was conducted due to national recommendations for restrictions regarding the COVID-19 Pandemic (e.g. social distancing, sheltering in place) in an effort to limit this patient's exposure and mitigate transmission in our community. All issues noted in this document were discussed and addressed.  A physical exam was not performed with this format.  I connected with [name] Foster. on 09/08/21 at 1:30 pm  by telephone and verified that I am speaking with the correct person using two identifiers. [name] Lejuan Botto. is currently located at home during visit. The provider, Ivy Lynn, NP is located in their office at time of visit.  I discussed the limitations, risks, security and privacy concerns of performing an evaluation and management service by telephone and the availability of in person appointments. I also discussed with the patient that there may be a patient responsible charge related to this service. The patient expressed understanding and agreed to proceed.   History and Present Illness:  Sinusitis This is a recurrent problem. The current episode started in the past 7 days. The problem has been gradually worsening since onset. There has been no fever. Associated symptoms include chills, congestion, headaches and sinus pressure. Pertinent negatives include no coughing or sore throat. Past treatments include oral decongestants and saline sprays. The treatment provided no relief.     Review of Systems  Constitutional:  Positive for chills.  HENT:  Positive for congestion and sinus pressure. Negative for sore throat.   Respiratory:  Negative for cough.   Skin:  Negative for rash.  Neurological:  Positive for headaches.    Observations/Objective: Tele-visit patient not in distress   Assessment and Plan: Take meds as prescribed - Use a cool mist humidifier  -Use saline nose sprays  frequently -Force fluids -For fever or aches or pains- take Tylenol or ibuprofen. -Augmentin 875-125 mg tablet by mouth twice daily for 7 days. -If symptoms do not improve, he may need to be COVID tested to rule this out Follow up with worsening unresolved symptoms   Follow Up Instructions: Follow up with worsening or unresolved symptoms    I discussed the assessment and treatment plan with the patient. The patient was provided an opportunity to ask questions and all were answered. The patient agreed with the plan and demonstrated an understanding of the instructions.   The patient was advised to call back or seek an in-person evaluation if the symptoms worsen or if the condition fails to improve as anticipated.  The above assessment and management plan was discussed with the patient. The patient verbalized understanding of and has agreed to the management plan. Patient is aware to call the clinic if symptoms persist or worsen. Patient is aware when to return to the clinic for a follow-up visit. Patient educated on when it is appropriate to go to the emergency department.   Time call ended: 1:37 PM  I provided 7 minutes of  non face-to-face time during this encounter.    Ivy Lynn, NP

## 2021-09-08 NOTE — Assessment & Plan Note (Signed)
Take meds as prescribed - Use a cool mist humidifier  -Use saline nose sprays frequently -Force fluids -For fever or aches or pains- take Tylenol or ibuprofen. -Augmentin 875-125 mg tablet by mouth twice daily for 7 days. -If symptoms do not improve, he may need to be COVID tested to rule this out Follow up with worsening unresolved symptoms

## 2021-09-08 NOTE — Patient Instructions (Signed)

## 2021-09-22 DIAGNOSIS — S39012A Strain of muscle, fascia and tendon of lower back, initial encounter: Secondary | ICD-10-CM | POA: Diagnosis not present

## 2021-09-22 DIAGNOSIS — M549 Dorsalgia, unspecified: Secondary | ICD-10-CM | POA: Diagnosis not present

## 2021-10-18 ENCOUNTER — Telehealth: Payer: Self-pay | Admitting: Family Medicine

## 2021-10-18 NOTE — Telephone Encounter (Signed)
°  Prescription Request  10/18/2021  Is this a "Controlled Substance" medicine?   Have you seen your PCP in the last 2 weeks? No pt next f/u with PCP is on 12/16/2021  If YES, route message to pool  -  If NO, patient needs to be scheduled for appointment.  What is the name of the medication or equipment? sildenafil (REVATIO) 100 MG tablet   gabapentin (NEURONTIN) 300 MG capsule  Have you contacted your pharmacy to request a refill? yes   Which pharmacy would you like this sent to? [facility], pt wants to speak Pricilla Riffle about sildenafil    Patient notified that their request is being sent to the clinical staff for review and that they should receive a response within 2 business days.

## 2021-10-20 ENCOUNTER — Other Ambulatory Visit: Payer: Self-pay | Admitting: Family Medicine

## 2021-10-20 ENCOUNTER — Telehealth: Payer: Self-pay | Admitting: Family Medicine

## 2021-10-20 MED ORDER — SILDENAFIL CITRATE 20 MG PO TABS: 20.0000 mg | ORAL_TABLET | ORAL | 1 refills | Status: DC | PRN

## 2021-10-20 MED ORDER — SILDENAFIL CITRATE 100 MG PO TABS: 50.0000 mg | ORAL_TABLET | Freq: Every day | ORAL | 3 refills | Status: DC | PRN

## 2021-10-20 MED ORDER — GABAPENTIN 300 MG PO CAPS: 300.0000 mg | ORAL_CAPSULE | Freq: Every day | ORAL | 0 refills | Status: DC

## 2021-10-20 NOTE — Progress Notes (Signed)
Sent Viagra at the higher dose

## 2021-10-20 NOTE — Telephone Encounter (Signed)
Pt returned missed call. Reviewed Cathys note with pt. Pt voiced understanding.

## 2021-10-20 NOTE — Telephone Encounter (Signed)
LMOVM refill sent to pharmacy 

## 2021-10-20 NOTE — Telephone Encounter (Signed)
Pt called back in about refills Sildenafil printed instead of going electronically, but he is wanting 100 mg tablets. He is also wanting refill on his Gabapentin, which is a historical med. Please advise. Last OV in Aug, Next OV will be in Feb

## 2021-10-20 NOTE — Telephone Encounter (Signed)
Pt says that he takes the 100 mg tablets because it is cheaper with insurance and he cuts up in four pieces. The gabapentin was prescribed by previous pcp Simona Huh. He switch from her to Dr Warrick Parisian.

## 2021-10-20 NOTE — Addendum Note (Signed)
Addended by: Antonietta Barcelona D on: 10/20/2021 03:22 PM   Modules accepted: Orders

## 2021-10-20 NOTE — Telephone Encounter (Signed)
LMOVM Sildenafil was changed to the 100 mg tab and Gabapentin was refilled

## 2021-12-16 ENCOUNTER — Ambulatory Visit (INDEPENDENT_AMBULATORY_CARE_PROVIDER_SITE_OTHER): Payer: PPO | Admitting: Family Medicine

## 2021-12-16 ENCOUNTER — Other Ambulatory Visit: Payer: Self-pay | Admitting: Family Medicine

## 2021-12-16 ENCOUNTER — Encounter: Payer: Self-pay | Admitting: Family Medicine

## 2021-12-16 VITALS — BP 100/73 | HR 98 | Ht 70.0 in | Wt 174.4 lb

## 2021-12-16 DIAGNOSIS — Z0000 Encounter for general adult medical examination without abnormal findings: Secondary | ICD-10-CM

## 2021-12-16 DIAGNOSIS — E782 Mixed hyperlipidemia: Secondary | ICD-10-CM

## 2021-12-16 DIAGNOSIS — I10 Essential (primary) hypertension: Secondary | ICD-10-CM

## 2021-12-16 DIAGNOSIS — Z23 Encounter for immunization: Secondary | ICD-10-CM

## 2021-12-16 DIAGNOSIS — R7303 Prediabetes: Secondary | ICD-10-CM

## 2021-12-16 DIAGNOSIS — Z0001 Encounter for general adult medical examination with abnormal findings: Secondary | ICD-10-CM | POA: Diagnosis not present

## 2021-12-16 DIAGNOSIS — Z125 Encounter for screening for malignant neoplasm of prostate: Secondary | ICD-10-CM

## 2021-12-16 MED ORDER — MELOXICAM 15 MG PO TABS: 15.0000 mg | ORAL_TABLET | Freq: Every day | ORAL | 3 refills | Status: DC

## 2021-12-16 MED ORDER — METOPROLOL TARTRATE 25 MG PO TABS: 25.0000 mg | ORAL_TABLET | Freq: Two times a day (BID) | ORAL | 3 refills | Status: DC

## 2021-12-16 MED ORDER — GABAPENTIN 300 MG PO CAPS: 300.0000 mg | ORAL_CAPSULE | Freq: Every day | ORAL | 3 refills | Status: DC

## 2021-12-16 MED ORDER — LIVALO 2 MG PO TABS: 2.0000 mg | ORAL_TABLET | Freq: Every day | ORAL | 3 refills | Status: DC

## 2021-12-16 MED ORDER — HYDROCHLOROTHIAZIDE 25 MG PO TABS: 25.0000 mg | ORAL_TABLET | Freq: Every day | ORAL | 3 refills | Status: DC

## 2021-12-16 MED ORDER — POTASSIUM CHLORIDE CRYS ER 20 MEQ PO TBCR: 20.0000 meq | EXTENDED_RELEASE_TABLET | Freq: Every day | ORAL | 3 refills | Status: DC

## 2021-12-16 NOTE — Progress Notes (Signed)
BP 100/73    Pulse 98    Ht '5\' 10"'  (1.778 m)    Wt 174 lb 6.4 oz (79.1 kg)    SpO2 98%    BMI 25.02 kg/m    Subjective:   Patient ID: [name] Morale., male    DOB: 12-07-1955, 66 y.o.   MRN: [id number]  HPI: [name] Cordon Gassett. is a 66 y.o. male presenting on 12/16/2021 for Medication Management (cpe)   HPI Adult well exam Patient is coming in today for adult well exam and physical.  He denies any major health issues or concerns.  He is still taking his medicine.  Patient denies any chest pain, shortness of breath, headaches or vision issues, abdominal complaints, diarrhea, nausea, vomiting, or joint issues.   Hypertension Patient is currently on hydrochlorothiazide and metoprolol, and their blood pressure today is 100/73. Patient denies any lightheadedness or dizziness. Patient denies headaches, blurred vision, chest pains, shortness of breath, or weakness. Denies any side effects from medication and is content with current medication.   Hyperlipidemia Patient is coming in for recheck of his hyperlipidemia. The patient is currently taking Livalo. They deny any issues with myalgias or history of liver damage from it. They deny any focal numbness or weakness or chest pain.   Prediabetes  patient comes in today for recheck of his diabetes. Patient has been currently taking no medication currently, is diet controlled. Patient is not currently on an ACE inhibitor/ARB. Patient has not seen an ophthalmologist this year. Patient denies any issues with their feet. The symptom started onset as an adult hypertension and hyperlipidemia ARE RELATED TO DM   Relevant past medical, surgical, family and social history reviewed and updated as indicated. Interim medical history since our last visit reviewed. Allergies and medications reviewed and updated.  Review of Systems  Constitutional:  Negative for chills and fever.  HENT:  Negative for ear pain and tinnitus.   Eyes:  Negative for pain and visual  disturbance.  Respiratory:  Negative for cough, shortness of breath and wheezing.   Cardiovascular:  Negative for chest pain, palpitations and leg swelling.  Gastrointestinal:  Negative for abdominal pain, blood in stool, constipation and diarrhea.  Genitourinary:  Negative for dysuria and hematuria.  Musculoskeletal:  Negative for back pain, gait problem and myalgias.  Skin:  Negative for rash.  Neurological:  Negative for dizziness, weakness and headaches.  Psychiatric/Behavioral:  Negative for suicidal ideas.   All other systems reviewed and are negative.  Per HPI unless specifically indicated above   Allergies as of 12/16/2021   No Known Allergies      Medication List        Accurate as of December 16, 2021 11:00 AM. If you have any questions, ask your nurse or doctor.          STOP taking these medications    amoxicillin-clavulanate 875-125 MG tablet Commonly known as: AUGMENTIN Stopped by: Fransisca Kaufmann Lasha Echeverria, MD       TAKE these medications    famotidine 20 MG tablet Commonly known as: PEPCID Take 20 mg by mouth 2 (two) times daily.   Fish Oil 1000 MG Caps Take 1,000 mg by mouth daily.   gabapentin 300 MG capsule Commonly known as: NEURONTIN Take 1 capsule (300 mg total) by mouth daily.   hydrochlorothiazide 25 MG tablet Commonly known as: HYDRODIURIL Take 1 tablet (25 mg total) by mouth daily.   Livalo 2 MG Tabs Generic drug: Pitavastatin Calcium Take 1 tablet (  2 mg total) by mouth daily.   meloxicam 15 MG tablet Commonly known as: MOBIC Take 1 tablet (15 mg total) by mouth daily.   metoprolol tartrate 25 MG tablet Commonly known as: LOPRESSOR Take 1 tablet (25 mg total) by mouth 2 (two) times daily.   MULTIVITAMIN PO Take 1 tablet by mouth daily.   potassium chloride SA 20 MEQ tablet Commonly known as: KLOR-CON M Take 1 tablet (20 mEq total) by mouth daily.   sildenafil 100 MG tablet Commonly known as: Viagra Take 0.5-1 tablets  (50-100 mg total) by mouth daily as needed for erectile dysfunction.         Objective:   BP 100/73    Pulse 98    Ht '5\' 10"'  (1.778 m)    Wt 174 lb 6.4 oz (79.1 kg)    SpO2 98%    BMI 25.02 kg/m   Wt Readings from Last 3 Encounters:  12/16/21 174 lb 6.4 oz (79.1 kg)  06/17/21 179 lb (81.2 kg)  06/10/21 180 lb 6.4 oz (81.8 kg)    Physical Exam Vitals and nursing note reviewed.  Constitutional:      General: He is not in acute distress.    Appearance: He is well-developed. He is not diaphoretic.  HENT:     Right Ear: External ear normal.     Left Ear: External ear normal.     Nose: Nose normal.     Mouth/Throat:     Pharynx: No oropharyngeal exudate.  Eyes:     General: No scleral icterus.       Right eye: No discharge.     Conjunctiva/sclera: Conjunctivae normal.     Pupils: Pupils are equal, round, and reactive to light.  Neck:     Thyroid: No thyromegaly.  Cardiovascular:     Rate and Rhythm: Normal rate and regular rhythm.     Heart sounds: Normal heart sounds. No murmur heard. Pulmonary:     Effort: Pulmonary effort is normal. No respiratory distress.     Breath sounds: Normal breath sounds. No wheezing.  Abdominal:     General: Bowel sounds are normal. There is no distension.     Palpations: Abdomen is soft.     Tenderness: There is no abdominal tenderness. There is no guarding or rebound.  Musculoskeletal:        General: Normal range of motion.     Cervical back: Neck supple.  Lymphadenopathy:     Cervical: No cervical adenopathy.  Skin:    General: Skin is warm and dry.     Findings: No rash.  Neurological:     Mental Status: He is alert and oriented to person, place, and time.     Coordination: Coordination normal.  Psychiatric:        Behavior: Behavior normal.      Assessment & Plan:   Problem List Items Addressed This Visit       Cardiovascular and Mediastinum   Essential hypertension   Relevant Medications   LIVALO 2 MG TABS    hydrochlorothiazide (HYDRODIURIL) 25 MG tablet   metoprolol tartrate (LOPRESSOR) 25 MG tablet   potassium chloride SA (KLOR-CON M) 20 MEQ tablet   Other Relevant Orders   CBC with Differential/Platelet     Other   Hyperlipidemia   Relevant Medications   LIVALO 2 MG TABS   hydrochlorothiazide (HYDRODIURIL) 25 MG tablet   metoprolol tartrate (LOPRESSOR) 25 MG tablet   Other Relevant Orders   CMP14+EGFR  Lipid panel   Prediabetes   Other Visit Diagnoses     Physical exam    -  Primary   Relevant Orders   CBC with Differential/Platelet   CMP14+EGFR   Need for shingles vaccine       Relevant Orders   Varicella-zoster vaccine IM (Shingrix) (Completed)   Prostate cancer screening       Relevant Orders   PSA, total and free       Patient is looking pretty good.  No changes in medication today, will check blood work. Follow up plan: Return in about 6 months (around 06/15/2022), or if symptoms worsen or fail to improve, for Prediabetes and Hypertension and cholesterol.  Counseling provided for all of the vaccine components Orders Placed This Encounter  Procedures   Varicella-zoster vaccine IM (Shingrix)   CBC with Differential/Platelet   CMP14+EGFR   Lipid panel   PSA, total and free    Caryl Pina, MD [facility] 12/16/2021, 11:00 AM

## 2021-12-16 NOTE — Telephone Encounter (Signed)
Can we run a prior Auth, because the patient has been on this medicine already and it has already been covered

## 2021-12-17 LAB — CMP14+EGFR
ALT: 24 IU/L (ref 0–44)
AST: 17 IU/L (ref 0–40)
Albumin/Globulin Ratio: 2.1 (ref 1.2–2.2)
Albumin: 4.8 g/dL (ref 3.8–4.8)
Alkaline Phosphatase: 100 IU/L (ref 44–121)
BUN/Creatinine Ratio: 18 (ref 10–24)
BUN: 17 mg/dL (ref 8–27)
Bilirubin Total: 0.4 mg/dL (ref 0.0–1.2)
CO2: 21 mmol/L (ref 20–29)
Calcium: 9.3 mg/dL (ref 8.6–10.2)
Chloride: 103 mmol/L (ref 96–106)
Creatinine, Ser: 0.93 mg/dL (ref 0.76–1.27)
Globulin, Total: 2.3 g/dL (ref 1.5–4.5)
Glucose: 97 mg/dL (ref 70–99)
Potassium: 3.9 mmol/L (ref 3.5–5.2)
Sodium: 139 mmol/L (ref 134–144)
Total Protein: 7.1 g/dL (ref 6.0–8.5)
eGFR: 92 mL/min/{1.73_m2} (ref >59)

## 2021-12-17 LAB — LIPID PANEL
Chol/HDL Ratio: 3.4 ratio (ref 0.0–5.0)
Cholesterol, Total: 134 mg/dL (ref 100–199)
HDL: 40 mg/dL (ref >39)
LDL Chol Calc (NIH): 81 mg/dL (ref 0–99)
Triglycerides: 61 mg/dL (ref 0–149)
VLDL Cholesterol Cal: 13 mg/dL (ref 5–40)

## 2021-12-17 LAB — CBC WITH DIFFERENTIAL/PLATELET
Basophils Absolute: 0.1 10*3/uL (ref 0.0–0.2)
Basos: 1 %
EOS (ABSOLUTE): 0.1 10*3/uL (ref 0.0–0.4)
Eos: 1 %
Hematocrit: 48.8 % (ref 37.5–51.0)
Hemoglobin: 15.5 g/dL (ref 13.0–17.7)
Immature Grans (Abs): 0 10*3/uL (ref 0.0–0.1)
Immature Granulocytes: 0 %
Lymphocytes Absolute: 1.9 10*3/uL (ref 0.7–3.1)
Lymphs: 24 %
MCH: 26 pg — ABNORMAL LOW (ref 26.6–33.0)
MCHC: 31.8 g/dL (ref 31.5–35.7)
MCV: 82 fL (ref 79–97)
Monocytes Absolute: 0.7 10*3/uL (ref 0.1–0.9)
Monocytes: 9 %
Neutrophils Absolute: 5.4 10*3/uL (ref 1.4–7.0)
Neutrophils: 65 %
Platelets: 210 10*3/uL (ref 150–450)
RBC: 5.97 x10E6/uL — ABNORMAL HIGH (ref 4.14–5.80)
RDW: 13.4 % (ref 11.6–15.4)
WBC: 8.3 10*3/uL (ref 3.4–10.8)

## 2021-12-17 LAB — PSA, TOTAL AND FREE
PSA, Free Pct: 30.6 %
PSA, Free: 0.49 ng/mL
Prostate Specific Ag, Serum: 1.6 ng/mL (ref 0.0–4.0)

## 2021-12-23 ENCOUNTER — Telehealth: Payer: Self-pay | Admitting: Family Medicine

## 2021-12-23 NOTE — Telephone Encounter (Signed)
Patient to take his health team advantage card to [facility] which he states is who cover the livalo.

## 2021-12-23 NOTE — Telephone Encounter (Signed)
Livalo is a tier 4 this year for pt. Pharmacy informed pt that if he switched to a statin that it would be a tier 1 and be a lot cheaper.  Pt would like to switch if ok with Dettinger

## 2021-12-24 MED ORDER — ROSUVASTATIN CALCIUM 20 MG PO TABS: 20.0000 mg | ORAL_TABLET | Freq: Every day | ORAL | 3 refills | Status: DC

## 2021-12-24 NOTE — Telephone Encounter (Signed)
Pt has been informed and understood. Crestor 20mg  nightly sent to [facility] in [place]

## 2021-12-24 NOTE — Telephone Encounter (Signed)
Yes I am fine with that, switch him to Crestor 20 mg nightly and give him 1 years worth.

## 2022-02-23 ENCOUNTER — Other Ambulatory Visit: Payer: Self-pay | Admitting: Family Medicine

## 2022-02-25 ENCOUNTER — Other Ambulatory Visit: Payer: Self-pay | Admitting: Family Medicine

## 2022-02-25 MED ORDER — FAMOTIDINE 20 MG PO TABS: 20.0000 mg | ORAL_TABLET | Freq: Two times a day (BID) | ORAL | 3 refills | Status: DC

## 2022-02-25 NOTE — Telephone Encounter (Signed)
Historical med. Started on by Simona Huh w/ [facility] in [place] 12/16/21 Next OV 06/15/22 ?

## 2022-02-25 NOTE — Telephone Encounter (Signed)
?  Prescription Request ? ?02/25/2022 ? ?Is this a "Controlled Substance" medicine? no ? ?Have you seen your PCP in the last 2 weeks? no ? ?If YES, route message to pool  -  If NO, patient needs to be scheduled for appointment. ? ?What is the name of the medication or equipment? famotidine (PEPCID) 20 MG tablet ? ?Have you contacted your pharmacy to request a refill? yes  ? ?Which pharmacy would you like this sent to? [facility]  ? ? ?Patient notified that their request is being sent to the clinical staff for review and that they should receive a response within 2 business days.  ?  ?

## 2022-02-26 ENCOUNTER — Other Ambulatory Visit: Payer: Self-pay | Admitting: Nurse Practitioner

## 2022-02-28 ENCOUNTER — Telehealth: Payer: Self-pay

## 2022-02-28 ENCOUNTER — Other Ambulatory Visit: Payer: Self-pay | Admitting: Nurse Practitioner

## 2022-02-28 NOTE — Telephone Encounter (Signed)
This was a historical med (from [facility])  ? ?There is no DX in the chart for this med - will need for PA to be completed.  ?

## 2022-02-28 NOTE — Telephone Encounter (Signed)
Please forward to patients PCP. Thank you ?

## 2022-02-28 NOTE — Telephone Encounter (Signed)
Received PA for Famotidine '20MG'$  tablets.  ?No diagnosis associated with medication or on problem list.  What is the dx?  ?

## 2022-03-03 ENCOUNTER — Other Ambulatory Visit: Payer: Self-pay | Admitting: Family Medicine

## 2022-03-03 DIAGNOSIS — R059 Cough, unspecified: Secondary | ICD-10-CM

## 2022-03-03 MED ORDER — FAMOTIDINE 20 MG PO TABS: 20.0000 mg | ORAL_TABLET | Freq: Two times a day (BID) | ORAL | 3 refills | Status: DC

## 2022-03-03 NOTE — Progress Notes (Unsigned)
Placed order for famotidine based on his recurrent cough ?

## 2022-03-03 NOTE — Progress Notes (Signed)
Patient aware.

## 2022-03-08 NOTE — Telephone Encounter (Signed)
PA for famotidine denied by insurance it is not a covered benefit.  ?

## 2022-05-10 ENCOUNTER — Other Ambulatory Visit: Payer: Self-pay | Admitting: Family Medicine

## 2022-06-01 ENCOUNTER — Ambulatory Visit (INDEPENDENT_AMBULATORY_CARE_PROVIDER_SITE_OTHER): Payer: PPO

## 2022-06-01 VITALS — Wt 174.0 lb

## 2022-06-01 DIAGNOSIS — Z0000 Encounter for general adult medical examination without abnormal findings: Secondary | ICD-10-CM

## 2022-06-01 NOTE — Progress Notes (Signed)
Virtual Visit via Telephone Note  I connected with  [name] Foster. on 06/01/22 at  2:30 PM EDT by telephone and verified that I am speaking with the correct person using two identifiers.  Location: Patient: HOME Provider: [facility] Persons participating in the virtual visit: patient/Nurse Health Advisor   I discussed the limitations, risks, security and privacy concerns of performing an evaluation and management service by telephone and the availability of in person appointments. The patient expressed understanding and agreed to proceed.  Interactive audio and video telecommunications were attempted between this nurse and patient, however failed, due to patient having technical difficulties OR patient did not have access to video capability.  We continued and completed visit with audio only.  Some vital signs may be absent or patient reported.   Dionisio David, LPN  Subjective:   [name] Foster. is a 66 y.o. male who presents for Medicare Annual/Subsequent preventive examination.  Review of Systems     Cardiac Risk Factors include: advanced age (>17mn, >>24women);dyslipidemia;hypertension;male gender     Objective:    There were no vitals filed for this visit. There is no height or weight on file to calculate BMI.     06/01/2022    2:31 PM 05/31/2021    4:40 PM 01/11/2021   12:57 PM 04/10/2020    7:14 AM 04/08/2020   11:45 AM 09/12/2016    2:15 PM 07/01/2016    4:30 PM  Advanced Directives  Does Patient Have a Medical Advance Directive? No No No Yes No Yes Yes  Type of Advance Directive      Living will Living will  Does patient want to make changes to medical advance directive?      No - Patient declined No - Patient declined  Copy of [facility] [place] Chart?      No - copy requested No - copy requested  Would patient like information on creating a medical advance directive? No - Patient declined No - Patient declined No - Patient declined  No - Patient declined       Current Medications (verified) Outpatient Encounter Medications as of 06/01/2022  Medication Sig   famotidine (PEPCID) 20 MG tablet Take 1 tablet (20 mg total) by mouth 2 (two) times daily.   gabapentin (NEURONTIN) 300 MG capsule Take 1 capsule (300 mg total) by mouth daily.   hydrochlorothiazide (HYDRODIURIL) 25 MG tablet Take 1 tablet (25 mg total) by mouth daily.   meloxicam (MOBIC) 15 MG tablet Take 1 tablet (15 mg total) by mouth daily.   metoprolol tartrate (LOPRESSOR) 25 MG tablet Take 1 tablet (25 mg total) by mouth 2 (two) times daily.   Multiple Vitamins-Minerals (MULTIVITAMIN PO) Take 1 tablet by mouth daily.   Omega-3 Fatty Acids (FISH OIL) 1000 MG CAPS Take 1,000 mg by mouth daily.   Pitavastatin Calcium (LIVALO) 2 MG TABS Take 1 tablet (2 mg total) by mouth daily. Patient to bring new insurance card.   potassium chloride SA (KLOR-CON M) 20 MEQ tablet Take 1 tablet (20 mEq total) by mouth daily.   rosuvastatin (CRESTOR) 20 MG tablet Take 20 mg by mouth daily.   SHINGRIX injection    sildenafil (VIAGRA) 100 MG tablet TAKE 1/2 TO 1 TABLET BY MOUTH DAILY AS NEEDED FOR ERECTILE DYSFUNCTION   No facility-administered encounter medications on file as of 06/01/2022.    Allergies (verified) Patient has no known allergies.   History: Past Medical History:  Diagnosis Date   Abnormal  EKG    Apical variant hypertrophic cardiomyopathy (HCC)    Benign essential hypertension    BPH (benign prostatic hyperplasia)    Hypokalemia    Tobacco abuse    Past Surgical History:  Procedure Laterality Date   CARDIAC CATHETERIZATION     2008   COLONOSCOPY N/A 02/26/2015   Procedure: COLONOSCOPY;  Surgeon: Rogene Houston, MD;  [facility];  Service: Endoscopy;  Laterality: N/A;  830   COLONOSCOPY WITH PROPOFOL N/A 04/10/2020   Procedure: COLONOSCOPY WITH PROPOFOL;  Surgeon: Rogene Houston, MD;  [facility];  Service: Endoscopy;  Laterality: N/A;  [place] Right 01/13/2021   Procedure: HERNIA REPAIR INGUINAL ADULT;  Surgeon: Aviva Signs, MD;  Location: [facility];  Service: General;  Laterality: Right;   KIDNEY SURGERY Right April 2015   benign tumor removal   POLYPECTOMY  04/10/2020   Procedure: POLYPECTOMY;  Surgeon: Rogene Houston, MD;  [facility];  Service: Endoscopy;;  proximal transverse colon(cs); transverse colon polyp (cbiopsy);sigmoid colon polyp(cb);   PROSTATE ABLATION  12-2015 at [place] Left 01/18/2016   Procedure: SHOULDER ACROMIOPLASTY;  Surgeon: Milly Jakob, MD;  Location: [place];  Service: Orthopedics;  Laterality: Left;   SHOULDER ARTHROSCOPY WITH ROTATOR CUFF REPAIR AND SUBACROMIAL DECOMPRESSION Left 01/18/2016   Procedure: LEFT SHOULDER ARTHROSCOPY WITH ROTATOR CUFF REPAIR AND SUBACROMIAL DECOMPRESSION;  Surgeon: Milly Jakob, MD;  Location: [place];  Service: Orthopedics;  Laterality: Left;  PRE-OP BLOCK WITH GENERAL ANESTHESIA   Family History  Problem Relation Age of Onset   Diabetes Mother    Diabetes Other    Hypertension Other    Social History   Socioeconomic History   Marital status: Divorced    Spouse name: Not on file   Number of children: 1   Years of education: Not on file   Highest education level: Not on file  Occupational History   Occupation: Full time Personal assistant: [employer]   Occupation: disability  Tobacco Use   Smoking status: Every Day    Packs/day: 0.25    Years: 28.00    Total pack years: 7.00    Types: Cigarettes   Smokeless tobacco: Never   Tobacco comments:    pack will last 4 days now - trying to quit 05/31/21  Vaping Use   Vaping Use: Never used  Substance and Sexual Activity   Alcohol use: No   Drug use: No   Sexual activity: Not on file  Other Topics Concern   Not on file  Social History Narrative   Divorced   05/31/2021 - has a 9 year old son who lives with his  mother, but he visits him every evening.   Social Determinants of Health   Financial Resource Strain: Low Risk  (06/01/2022)   Overall Financial Resource Strain (CARDIA)    Difficulty of Paying Living Expenses: Not hard at all  Food Insecurity: No Food Insecurity (06/01/2022)   Hunger Vital Sign    Worried About Running Out of Food in the Last Year: Never true    Ran Out of Food in the Last Year: Never true  Transportation Needs: No Transportation Needs (06/01/2022)   PRAPARE - Hydrologist (Medical): No    Lack of Transportation (Non-Medical): No  Physical Activity: Insufficiently Active (06/01/2022)   Exercise Vital Sign    Days of  Exercise per Week: 3 days    Minutes of Exercise per Session: 40 min  Stress: No Stress Concern Present (06/01/2022)   [place]    Feeling of Stress : Not at all  Social Connections: Moderately Isolated (06/01/2022)   Social Connection and Isolation Panel [NHANES]    Frequency of Communication with Friends and Family: More than three times a week    Frequency of Social Gatherings with Friends and Family: Three times a week    Attends Religious Services: More than 4 times per year    Active Member of Clubs or Organizations: No    Attends Archivist Meetings: Never    Marital Status: Divorced    Tobacco Counseling Ready to quit: Not Answered Counseling given: Not Answered Tobacco comments: pack will last 4 days now - trying to quit 05/31/21   Clinical Intake:  Pre-visit preparation completed: Yes  Pain : No/denies pain     Nutritional Risks: None Diabetes: No  How often do you need to have someone help you when you read instructions, pamphlets, or other written materials from your doctor or pharmacy?: 1 - Never  Diabetic?NO  Interpreter Needed?: No  Information entered by :: Kirke Shaggy, LPN   Activities of Daily Living    06/01/2022     2:32 PM  In your present state of health, do you have any difficulty performing the following activities:  Hearing? 0  Vision? 0  Difficulty concentrating or making decisions? 0  Walking or climbing stairs? 0  Dressing or bathing? 0  Doing errands, shopping? 0  Preparing Food and eating ? N  Using the Toilet? N  In the past six months, have you accidently leaked urine? N  Do you have problems with loss of bowel control? N  Managing your Medications? N  Managing your Finances? N  Housekeeping or managing your Housekeeping? N    Patient Care Team: Dettinger, Fransisca Kaufmann, MD as PCP - General (Family Medicine)  Indicate any recent Medical Services you may have received from other than [facility] providers in the past year (date may be approximate).     Assessment:   This is a routine wellness examination for [name] Foster.  Hearing/Vision screen Hearing Screening - Comments:: NO AIDS Vision Screening - Comments:: NO GLASSES  Dietary issues and exercise activities discussed: Current Exercise Habits: Home exercise routine, Type of exercise: walking, Time (Minutes): 45, Frequency (Times/Week): 3, Weekly Exercise (Minutes/Week): 135, Intensity: Mild   Goals Addressed             This Visit's Progress    DIET - EAT MORE FRUITS AND VEGETABLES         Depression Screen    06/01/2022    2:30 PM 12/16/2021   10:36 AM 06/17/2021   10:36 AM 06/10/2021    8:00 AM 05/31/2021    4:27 PM 05/26/2021    9:44 AM 03/04/2021    1:33 PM  PHQ 2/9 Scores  PHQ - 2 Score 0 0 0 0 0 0 0  PHQ- 9 Score 0 0   4 0     Fall Risk    06/01/2022    2:31 PM 12/16/2021   10:36 AM 06/17/2021   10:36 AM 05/31/2021    4:36 PM 05/26/2021    9:44 AM  Fall Risk   Falls in the past year? 0 0 0 0 0  Number falls in past yr: 0   0  Injury with Fall? 0   0   Risk for fall due to : No Fall Risks   No Fall Risks   Follow up Falls evaluation completed   Falls prevention discussed Falls evaluation completed    [place]:  Any stairs in or around the home? No  If so, are there any without handrails? No  Home free of loose throw rugs in walkways, pet beds, electrical cords, etc? Yes  Adequate lighting in your home to reduce risk of falls? Yes   ASSISTIVE DEVICES UTILIZED TO PREVENT FALLS:  Life alert? No  Use of a cane, walker or w/c? No  Grab bars in the bathroom? Yes  Shower chair or bench in shower? No  Elevated toilet seat or a handicapped toilet? No    Cognitive Function:        06/01/2022    2:32 PM 05/31/2021    4:40 PM  6CIT Screen  What Year? 0 points 0 points  What month? 0 points 0 points  What time? 0 points 0 points  Count back from 20 0 points 0 points  Months in reverse 0 points 0 points  Repeat phrase 0 points 0 points  Total Score 0 points 0 points    Immunizations Immunization History  Administered Date(s) Administered   Influenza Inj Mdck Quad Pf 10/04/2017, 08/15/2018, 08/09/2021   Influenza Inj Mdck Quad With Preservative 07/26/2019   Influenza,inj,Quad PF,6+ Mos 08/11/2015   Influenza-Unspecified 10/04/2017, 10/04/2017, 08/15/2018, 07/26/2019, 08/22/2020, 08/09/2021   Moderna Covid-19 Vaccine Bivalent Booster 67yr & up 08/02/2021   Moderna Sars-Covid-2 Vaccination 01/24/2020, 02/25/2020, 09/23/2020   Tdap 06/04/2013   Zoster Recombinat (Shingrix) 12/16/2021    TDAP status: Up to date  Flu Vaccine status: Up to date  Pneumococcal vaccine status: Declined,  Education has been provided regarding the importance of this vaccine but patient still declined. Advised may receive this vaccine at local pharmacy or Health Dept. Aware to provide a copy of the vaccination record if obtained from local pharmacy or Health Dept. Verbalized acceptance and understanding.   Covid-19 vaccine status: Completed vaccines  Qualifies for Shingles Vaccine? Yes   Zostavax completed No   Shingrix Completed?: Yes  Screening Tests Health Maintenance   Topic Date Due   Zoster Vaccines- Shingrix (2 of 2) 02/10/2022   INFLUENZA VACCINE  05/31/2022   TETANUS/TDAP  06/05/2023   COLONOSCOPY (Pts 45-42yrInsurance coverage will need to be confirmed)  04/10/2025   COVID-19 Vaccine  Completed   Hepatitis C Screening  Completed   HIV Screening  Completed   HPV VACCINES  Aged Out    Health Maintenance  Health Maintenance Due  Topic Date Due   Zoster Vaccines- Shingrix (2 of 2) 02/10/2022   INFLUENZA VACCINE  05/31/2022    Colorectal cancer screening: Type of screening: Colonoscopy. Completed 04/10/20. Repeat every 5 years  Lung Cancer Screening: (Low Dose CT Chest recommended if Age 64-80ears, 30 pack-year currently smoking OR have quit w/in 15years.) does not qualify.    Additional Screening:  Hepatitis C Screening: does qualify; Completed 02/06/17  Vision Screening: Recommended annual ophthalmology exams for early detection of glaucoma and other disorders of the eye. Is the patient up to date with their annual eye exam?  No  Who is the provider or what is the name of the office in which the patient attends annual eye exams? NO ONE If pt is not established with a provider, would they like to be  referred to a provider to establish care? No .   Dental Screening: Recommended annual dental exams for proper oral hygiene  Community Resource Referral / Chronic Care Management: CRR required this visit?  No   CCM required this visit?  No      Plan:     I have personally reviewed and noted the following in the patient's chart:   Medical and social history Use of alcohol, tobacco or illicit drugs  Current medications and supplements including opioid prescriptions. Patient is not currently taking opioid prescriptions. Functional ability and status Nutritional status Physical activity Advanced directives List of other physicians Hospitalizations, surgeries, and ER visits in previous 12 months Vitals Screenings to include  cognitive, depression, and falls Referrals and appointments  In addition, I have reviewed and discussed with patient certain preventive protocols, quality metrics, and best practice recommendations. A written personalized care plan for preventive services as well as general preventive health recommendations were provided to patient.     Dionisio David, LPN   06/03/939   Nurse Notes: Marlynn Perking

## 2022-06-01 NOTE — Patient Instructions (Signed)
Mr. [name] Foster , Thank you for taking time to come for your Medicare Wellness Visit. I appreciate your ongoing commitment to your health goals. Please review the following plan we discussed and let me know if I can assist you in the future.   Screening recommendations/referrals: Colonoscopy: 04/10/20 Recommended yearly ophthalmology/optometry visit for glaucoma screening and checkup Recommended yearly dental visit for hygiene and checkup  Vaccinations: Influenza vaccine: 08/09/21 Pneumococcal vaccine: N/D Tdap vaccine: 06/04/13 Shingles vaccine: [facility] 12/16/21, NEEDS   2ND SHOT Covid-19: 01/24/20, 02/25/20, 09/23/20  Advanced directives: NO  Conditions/risks identified: NONE  Next appointment: Follow up in one year for your annual wellness visit 06/05/23 @ 12 PM BY PHONE  Preventive Care 40-64 Years, Male Preventive care refers to lifestyle choices and visits with your health care provider that can promote health and wellness. What does preventive care include? A yearly physical exam. This is also called an annual well check. Dental exams once or twice a year. Routine eye exams. Ask your health care provider how often you should have your eyes checked. Personal lifestyle choices, including: Daily care of your teeth and gums. Regular physical activity. Eating a healthy diet. Avoiding tobacco and drug use. Limiting alcohol use. Practicing safe sex. Taking low-dose aspirin every day starting at age 18. What happens during an annual well check? The services and screenings done by your health care provider during your annual well check will depend on your age, overall health, lifestyle risk factors, and family history of disease. Counseling  Your health care provider may ask you questions about your: Alcohol use. Tobacco use. Drug use. Emotional well-being. Home and relationship well-being. Sexual activity. Eating habits. Work and work Statistician. Screening  You may have the  following tests or measurements: Height, weight, and BMI. Blood pressure. Lipid and cholesterol levels. These may be checked every 5 years, or more frequently if you are over 75 years old. Skin check. Lung cancer screening. You may have this screening every year starting at age 28 if you have a 30-pack-year history of smoking and currently smoke or have quit within the past 15 years. Fecal occult blood test (FOBT) of the stool. You may have this test every year starting at age 26. Flexible sigmoidoscopy or colonoscopy. You may have a sigmoidoscopy every 5 years or a colonoscopy every 10 years starting at age 38. Prostate cancer screening. Recommendations will vary depending on your family history and other risks. Hepatitis C blood test. Hepatitis B blood test. Sexually transmitted disease (STD) testing. Diabetes screening. This is done by checking your blood sugar (glucose) after you have not eaten for a while (fasting). You may have this done every 1-3 years. Discuss your test results, treatment options, and if necessary, the need for more tests with your health care provider. Vaccines  Your health care provider may recommend certain vaccines, such as: Influenza vaccine. This is recommended every year. Tetanus, diphtheria, and acellular pertussis (Tdap, Td) vaccine. You may need a Td booster every 10 years. Zoster vaccine. You may need this after age 78. Pneumococcal 13-valent conjugate (PCV13) vaccine. You may need this if you have certain conditions and have not been vaccinated. Pneumococcal polysaccharide (PPSV23) vaccine. You may need one or two doses if you smoke cigarettes or if you have certain conditions. Talk to your health care provider about which screenings and vaccines you need and how often you need them. This information is not intended to replace advice given to you by your health care provider. Make sure you  discuss any questions you have with your health care  provider. Document Released: 11/13/2015 Document Revised: 07/06/2016 Document Reviewed: 08/18/2015 Elsevier Interactive Patient Education  2017 [place] Prevention in the Home Falls can cause injuries. They can happen to people of all ages. There are many things you can do to make your home safe and to help prevent falls. What can I do on the outside of my home? Regularly fix the edges of walkways and driveways and fix any cracks. Remove anything that might make you trip as you walk through a door, such as a raised step or threshold. Trim any bushes or trees on the path to your home. Use bright outdoor lighting. Clear any walking paths of anything that might make someone trip, such as rocks or tools. Regularly check to see if handrails are loose or broken. Make sure that both sides of any steps have handrails. Any raised decks and porches should have guardrails on the edges. Have any leaves, snow, or ice cleared regularly. Use sand or salt on walking paths during winter. Clean up any spills in your garage right away. This includes oil or grease spills. What can I do in the bathroom? Use night lights. Install grab bars by the toilet and in the tub and shower. Do not use towel bars as grab bars. Use non-skid mats or decals in the tub or shower. If you need to sit down in the shower, use a plastic, non-slip stool. Keep the floor dry. Clean up any water that spills on the floor as soon as it happens. Remove soap buildup in the tub or shower regularly. Attach bath mats securely with double-sided non-slip rug tape. Do not have throw rugs and other things on the floor that can make you trip. What can I do in the bedroom? Use night lights. Make sure that you have a light by your bed that is easy to reach. Do not use any sheets or blankets that are too big for your bed. They should not hang down onto the floor. Have a firm chair that has side arms. You can use this for support while  you get dressed. Do not have throw rugs and other things on the floor that can make you trip. What can I do in the kitchen? Clean up any spills right away. Avoid walking on wet floors. Keep items that you use a lot in easy-to-reach places. If you need to reach something above you, use a strong step stool that has a grab bar. Keep electrical cords out of the way. Do not use floor polish or wax that makes floors slippery. If you must use wax, use non-skid floor wax. Do not have throw rugs and other things on the floor that can make you trip. What can I do with my stairs? Do not leave any items on the stairs. Make sure that there are handrails on both sides of the stairs and use them. Fix handrails that are broken or loose. Make sure that handrails are as long as the stairways. Check any carpeting to make sure that it is firmly attached to the stairs. Fix any carpet that is loose or worn. Avoid having throw rugs at the top or bottom of the stairs. If you do have throw rugs, attach them to the floor with carpet tape. Make sure that you have a light switch at the top of the stairs and the bottom of the stairs. If you do not have them, ask someone to  add them for you. What else can I do to help prevent falls? Wear shoes that: Do not have high heels. Have rubber bottoms. Are comfortable and fit you well. Are closed at the toe. Do not wear sandals. If you use a stepladder: Make sure that it is fully opened. Do not climb a closed stepladder. Make sure that both sides of the stepladder are locked into place. Ask someone to hold it for you, if possible. Clearly mark and make sure that you can see: Any grab bars or handrails. First and last steps. Where the edge of each step is. Use tools that help you move around (mobility aids) if they are needed. These include: Canes. Walkers. Scooters. Crutches. Turn on the lights when you go into a dark area. Replace any light bulbs as soon as they burn  out. Set up your furniture so you have a clear path. Avoid moving your furniture around. If any of your floors are uneven, fix them. If there are any pets around you, be aware of where they are. Review your medicines with your doctor. Some medicines can make you feel dizzy. This can increase your chance of falling. Ask your doctor what other things that you can do to help prevent falls. This information is not intended to replace advice given to you by your health care provider. Make sure you discuss any questions you have with your health care provider. Document Released: 08/13/2009 Document Revised: 03/24/2016 Document Reviewed: 11/21/2014 Elsevier Interactive Patient Education  2017 [employer].

## 2022-06-15 ENCOUNTER — Ambulatory Visit (INDEPENDENT_AMBULATORY_CARE_PROVIDER_SITE_OTHER): Payer: PPO | Admitting: Family Medicine

## 2022-06-15 ENCOUNTER — Encounter: Payer: Self-pay | Admitting: Family Medicine

## 2022-06-15 VITALS — BP 117/88 | HR 79 | Temp 97.4°F | Ht 70.0 in | Wt 178.0 lb

## 2022-06-15 DIAGNOSIS — R059 Cough, unspecified: Secondary | ICD-10-CM | POA: Diagnosis not present

## 2022-06-15 DIAGNOSIS — F17210 Nicotine dependence, cigarettes, uncomplicated: Secondary | ICD-10-CM

## 2022-06-15 DIAGNOSIS — E782 Mixed hyperlipidemia: Secondary | ICD-10-CM

## 2022-06-15 DIAGNOSIS — Z23 Encounter for immunization: Secondary | ICD-10-CM

## 2022-06-15 DIAGNOSIS — I10 Essential (primary) hypertension: Secondary | ICD-10-CM | POA: Diagnosis not present

## 2022-06-15 DIAGNOSIS — J439 Emphysema, unspecified: Secondary | ICD-10-CM

## 2022-06-15 DIAGNOSIS — R7303 Prediabetes: Secondary | ICD-10-CM

## 2022-06-15 LAB — BAYER DCA HB A1C WAIVED: HB A1C (BAYER DCA - WAIVED): 5.9 % — ABNORMAL HIGH (ref 4.8–5.6)

## 2022-06-15 MED ORDER — FAMOTIDINE 20 MG PO TABS: 20.0000 mg | ORAL_TABLET | Freq: Two times a day (BID) | ORAL | 3 refills | Status: DC

## 2022-06-15 MED ORDER — BUPROPION HCL ER (XL) 150 MG PO TB24: 150.0000 mg | ORAL_TABLET | Freq: Every day | ORAL | 0 refills | Status: DC

## 2022-06-15 NOTE — Progress Notes (Signed)
BP 117/88   Pulse 79   Temp (!) 97.4 F (36.3 C)   Ht '5\' 10"'  (1.778 m)   Wt 178 lb (80.7 kg)   SpO2 95%   BMI 25.54 kg/m    Subjective:   Patient ID: [name] Foster., male    DOB: Jan 01, 1956, 66 y.o.   MRN: [id number]  HPI: [name] Foster. is a 66 y.o. male presenting on 06/15/2022 for Medical Management of Chronic Issues and Hypertension   HPI Prediabetes  patient comes in today for recheck of his diabetes. Patient has been currently taking no medication and A1c looks good at 5.9. Patient is not currently on an ACE inhibitor/ARB. Patient has not seen an ophthalmologist this year. Patient denies any issues with their feet. The symptom started onset as an adult hypertension and hyperlipidemia ARE RELATED TO DM I  Hypertension Patient is currently on hydrochlorothiazide and metoprolol, and their blood pressure today is 117/80. Patient denies any lightheadedness or dizziness. Patient denies headaches, blurred vision, chest pains, shortness of breath, or weakness. Denies any side effects from medication and is content with current medication.   Hyperlipidemia Patient is coming in for recheck of his hyperlipidemia. The patient is currently taking Crestor and fish oil. They deny any issues with myalgias or history of liver damage from it. They deny any focal numbness or weakness or chest pain.   Patient has COPD and smoking and wants to do smoking cessation was try Wellbutrin.   Relevant past medical, surgical, family and social history reviewed and updated as indicated. Interim medical history since our last visit reviewed. Allergies and medications reviewed and updated.  Review of Systems  Constitutional:  Negative for chills and fever.  Eyes:  Negative for visual disturbance.  Respiratory:  Negative for shortness of breath and wheezing.   Cardiovascular:  Negative for chest pain and leg swelling.  Musculoskeletal:  Negative for back pain and gait problem.  Skin:  Negative for rash.   Neurological:  Negative for dizziness, weakness and light-headedness.  All other systems reviewed and are negative.   Per HPI unless specifically indicated above   Allergies as of 06/15/2022   No Known Allergies      Medication List        Accurate as of June 15, 2022 11:38 AM. If you have any questions, ask your nurse or doctor.          buPROPion 150 MG 24 hr tablet Commonly known as: Wellbutrin XL Take 1 tablet (150 mg total) by mouth daily. Started by: Worthy Rancher, MD   famotidine 20 MG tablet Commonly known as: PEPCID Take 1 tablet (20 mg total) by mouth 2 (two) times daily.   Fish Oil 1000 MG Caps Take 1,000 mg by mouth daily.   gabapentin 300 MG capsule Commonly known as: NEURONTIN Take 1 capsule (300 mg total) by mouth daily.   hydrochlorothiazide 25 MG tablet Commonly known as: HYDRODIURIL Take 1 tablet (25 mg total) by mouth daily.   Livalo 2 MG Tabs Generic drug: Pitavastatin Calcium Take 1 tablet (2 mg total) by mouth daily. Patient to bring new insurance card.   meloxicam 15 MG tablet Commonly known as: MOBIC Take 1 tablet (15 mg total) by mouth daily.   metoprolol tartrate 25 MG tablet Commonly known as: LOPRESSOR Take 1 tablet (25 mg total) by mouth 2 (two) times daily.   MULTIVITAMIN PO Take 1 tablet by mouth daily.   potassium chloride SA 20 MEQ tablet  Commonly known as: KLOR-CON M Take 1 tablet (20 mEq total) by mouth daily.   rosuvastatin 20 MG tablet Commonly known as: CRESTOR Take 20 mg by mouth daily.   Shingrix injection Generic drug: Zoster Vaccine Adjuvanted   sildenafil 100 MG tablet Commonly known as: VIAGRA TAKE 1/2 TO 1 TABLET BY MOUTH DAILY AS NEEDED FOR ERECTILE DYSFUNCTION         Objective:   BP 117/88   Pulse 79   Temp (!) 97.4 F (36.3 C)   Ht '5\' 10"'  (1.778 m)   Wt 178 lb (80.7 kg)   SpO2 95%   BMI 25.54 kg/m   Wt Readings from Last 3 Encounters:  06/15/22 178 lb (80.7 kg)  06/01/22  174 lb (78.9 kg)  12/16/21 174 lb 6.4 oz (79.1 kg)    Physical Exam Vitals and nursing note reviewed.  Constitutional:      General: He is not in acute distress.    Appearance: He is well-developed. He is not diaphoretic.  Eyes:     General: No scleral icterus.    Conjunctiva/sclera: Conjunctivae normal.  Neck:     Thyroid: No thyromegaly.  Cardiovascular:     Rate and Rhythm: Normal rate and regular rhythm.     Heart sounds: Normal heart sounds. No murmur heard. Pulmonary:     Effort: Pulmonary effort is normal. No respiratory distress.     Breath sounds: Normal breath sounds. No wheezing.  Musculoskeletal:        General: No swelling. Normal range of motion.     Cervical back: Neck supple.  Lymphadenopathy:     Cervical: No cervical adenopathy.  Skin:    General: Skin is warm and dry.     Findings: No rash.  Neurological:     Mental Status: He is alert and oriented to person, place, and time.     Coordination: Coordination normal.  Psychiatric:        Behavior: Behavior normal.       Assessment & Plan:   Problem List Items Addressed This Visit       Cardiovascular and Mediastinum   Essential hypertension   Relevant Orders   CBC with Differential/Platelet   CMP14+EGFR   Lipid panel   Bayer DCA Hb A1c Waived     Other   Hyperlipidemia - Primary   Relevant Orders   CBC with Differential/Platelet   CMP14+EGFR   Lipid panel   Bayer DCA Hb A1c Waived   Prediabetes   Relevant Orders   CBC with Differential/Platelet   CMP14+EGFR   Lipid panel   Bayer DCA Hb A1c Waived   Other Visit Diagnoses     Cough, unspecified type       Relevant Medications   famotidine (PEPCID) 20 MG tablet   Other Relevant Orders   CT CHEST LUNG CA SCREEN LOW DOSE W/O CM   Pulmonary emphysema, unspecified emphysema type (HCC)       Relevant Medications   buPROPion (WELLBUTRIN XL) 150 MG 24 hr tablet   Other Relevant Orders   CT CHEST LUNG CA SCREEN LOW DOSE W/O CM   Smokes  with greater than 30 pack year history       Relevant Medications   buPROPion (WELLBUTRIN XL) 150 MG 24 hr tablet   Other Relevant Orders   CT CHEST LUNG CA SCREEN LOW DOSE W/O CM     We will repeat CT chest, will give Wellbutrin to help with smoking cessation.  Patient is  already trying to quit and is down to less than a pack per day  Follow up plan: No follow-ups on file.  Counseling provided for all of the vaccine components Orders Placed This Encounter  Procedures   CT CHEST LUNG CA SCREEN LOW DOSE W/O CM   CBC with Differential/Platelet   CMP14+EGFR   Lipid panel   Bayer DCA Hb A1c Waived    Caryl Pina, MD [facility] 06/15/2022, 11:38 AM

## 2022-06-15 NOTE — Addendum Note (Signed)
Addended by: Lanier Prude D on: 06/15/2022 02:54 PM   Modules accepted: Orders

## 2022-06-16 LAB — CMP14+EGFR
ALT: 38 IU/L (ref 0–44)
AST: 25 IU/L (ref 0–40)
Albumin/Globulin Ratio: 2 (ref 1.2–2.2)
Albumin: 4.4 g/dL (ref 3.9–4.9)
Alkaline Phosphatase: 101 IU/L (ref 44–121)
BUN/Creatinine Ratio: 20 (ref 10–24)
BUN: 19 mg/dL (ref 8–27)
Bilirubin Total: 0.5 mg/dL (ref 0.0–1.2)
CO2: 20 mmol/L (ref 20–29)
Calcium: 9.1 mg/dL (ref 8.6–10.2)
Chloride: 103 mmol/L (ref 96–106)
Creatinine, Ser: 0.95 mg/dL (ref 0.76–1.27)
Globulin, Total: 2.2 g/dL (ref 1.5–4.5)
Glucose: 123 mg/dL — ABNORMAL HIGH (ref 70–99)
Potassium: 3.6 mmol/L (ref 3.5–5.2)
Sodium: 140 mmol/L (ref 134–144)
Total Protein: 6.6 g/dL (ref 6.0–8.5)
eGFR: 89 mL/min/{1.73_m2} (ref >59)

## 2022-06-16 LAB — LIPID PANEL
Chol/HDL Ratio: 3 ratio (ref 0.0–5.0)
Cholesterol, Total: 92 mg/dL — ABNORMAL LOW (ref 100–199)
HDL: 31 mg/dL — ABNORMAL LOW (ref >39)
LDL Chol Calc (NIH): 42 mg/dL (ref 0–99)
Triglycerides: 99 mg/dL (ref 0–149)
VLDL Cholesterol Cal: 19 mg/dL (ref 5–40)

## 2022-06-16 LAB — CBC WITH DIFFERENTIAL/PLATELET
Basophils Absolute: 0 10*3/uL (ref 0.0–0.2)
Basos: 0 %
EOS (ABSOLUTE): 0.1 10*3/uL (ref 0.0–0.4)
Eos: 1 %
Hematocrit: 43.7 % (ref 37.5–51.0)
Hemoglobin: 14.4 g/dL (ref 13.0–17.7)
Immature Grans (Abs): 0 10*3/uL (ref 0.0–0.1)
Immature Granulocytes: 0 %
Lymphocytes Absolute: 1.4 10*3/uL (ref 0.7–3.1)
Lymphs: 20 %
MCH: 26 pg — ABNORMAL LOW (ref 26.6–33.0)
MCHC: 33 g/dL (ref 31.5–35.7)
MCV: 79 fL (ref 79–97)
Monocytes Absolute: 0.7 10*3/uL (ref 0.1–0.9)
Monocytes: 10 %
Neutrophils Absolute: 4.8 10*3/uL (ref 1.4–7.0)
Neutrophils: 69 %
Platelets: 187 10*3/uL (ref 150–450)
RBC: 5.53 x10E6/uL (ref 4.14–5.80)
RDW: 13.6 % (ref 11.6–15.4)
WBC: 7.1 10*3/uL (ref 3.4–10.8)

## 2022-07-06 ENCOUNTER — Ambulatory Visit (HOSPITAL_COMMUNITY)
Admission: RE | Admit: 2022-07-06 | Discharge: 2022-07-06 | Disposition: A | Discharge status: Home / Self Care | Payer: PPO | Source: Ambulatory Visit | Attending: Family Medicine | Admitting: Family Medicine

## 2022-07-06 DIAGNOSIS — R059 Cough, unspecified: Secondary | ICD-10-CM | POA: Diagnosis not present

## 2022-07-06 DIAGNOSIS — J439 Emphysema, unspecified: Secondary | ICD-10-CM | POA: Insufficient documentation

## 2022-07-06 DIAGNOSIS — F17210 Nicotine dependence, cigarettes, uncomplicated: Secondary | ICD-10-CM | POA: Diagnosis not present

## 2022-07-11 ENCOUNTER — Telehealth: Payer: Self-pay

## 2022-07-11 DIAGNOSIS — R911 Solitary pulmonary nodule: Secondary | ICD-10-CM

## 2022-07-11 NOTE — Telephone Encounter (Signed)
-----   Message from Worthy Rancher, MD sent at 07/11/2022  7:58 AM EDT ----- Go ahead and place referral to [facility] diagnosis pulmonary nodule, coming in sooner would likely be too soon to see if there is any change or growth because of how small this spot is

## 2022-08-03 ENCOUNTER — Ambulatory Visit (INDEPENDENT_AMBULATORY_CARE_PROVIDER_SITE_OTHER): Payer: PPO | Admitting: Pulmonary Disease

## 2022-08-03 ENCOUNTER — Encounter: Payer: Self-pay | Admitting: Pulmonary Disease

## 2022-08-03 VITALS — BP 110/80 | HR 52 | Ht 70.5 in | Wt 181.0 lb

## 2022-08-03 DIAGNOSIS — Z85528 Personal history of other malignant neoplasm of kidney: Secondary | ICD-10-CM

## 2022-08-03 DIAGNOSIS — J432 Centrilobular emphysema: Secondary | ICD-10-CM | POA: Diagnosis not present

## 2022-08-03 DIAGNOSIS — Z716 Tobacco abuse counseling: Secondary | ICD-10-CM | POA: Diagnosis not present

## 2022-08-03 DIAGNOSIS — Z720 Tobacco use: Secondary | ICD-10-CM | POA: Diagnosis not present

## 2022-08-03 DIAGNOSIS — R911 Solitary pulmonary nodule: Secondary | ICD-10-CM

## 2022-08-03 MED ORDER — NICOTINE 7 MG/24HR TD PT24: 14.0000 mg | MEDICATED_PATCH | Freq: Every day | TRANSDERMAL | 1 refills | Status: AC

## 2022-08-03 MED ORDER — VARENICLINE TARTRATE 1 MG PO TABS: 1.0000 mg | ORAL_TABLET | Freq: Two times a day (BID) | ORAL | 1 refills | Status: DC

## 2022-08-03 NOTE — Progress Notes (Signed)
Synopsis: Referred in Oct 2023 for lung nodules by Dettinger, Fransisca Kaufmann, MD  Subjective:   PATIENT ID: [name] Foster. GENDER: male DOB: [birth year], MRN: [id number]  Chief Complaint  Patient presents with   Consult    Lung nodule    This is a 66 year old gentleman, past medical history of hypertension, longstanding history of tobacco use since he was a teenager.  At one time was smoking more than a pack a day.  Also has a history of alcohol use and marijuana use that he no longer does.  He has been really trying to cut down on his smoking.  He is made it down to only 5 to 6 cigarettes/day.  He has been using Nicorette gum to help curb his cravings.  He had a recent lung cancer screening CT that shows a small left upper lobe subpleural 4 mm nodule.  He is here today to discuss this.  He is anxious about having cancer.  He recently lost his brother to malignancy.  He was under sure of where it all started.  He currently uses no inhalers.  Recent CT imaging also showed evidence of upper lobe predominant emphysema.     Past Medical History:  Diagnosis Date   Abnormal EKG    Apical variant hypertrophic cardiomyopathy (HCC)    Benign essential hypertension    BPH (benign prostatic hyperplasia)    Hypokalemia    Tobacco abuse      Family History  Problem Relation Age of Onset   Diabetes Mother    Diabetes Other    Hypertension Other      Past Surgical History:  Procedure Laterality Date   CARDIAC CATHETERIZATION     2008   COLONOSCOPY N/A 02/26/2015   Procedure: COLONOSCOPY;  Surgeon: Rogene Houston, MD;  [facility];  Service: Endoscopy;  Laterality: N/A;  830   COLONOSCOPY WITH PROPOFOL N/A 04/10/2020   Procedure: COLONOSCOPY WITH PROPOFOL;  Surgeon: Rogene Houston, MD;  [facility];  Service: Endoscopy;  Laterality: N/A;  [place] Right 01/13/2021   Procedure: HERNIA REPAIR INGUINAL ADULT;  Surgeon: Aviva Signs, MD;  Location: [facility];  Service: General;  Laterality: Right;   KIDNEY SURGERY Right April 2015   benign tumor removal   POLYPECTOMY  04/10/2020   Procedure: POLYPECTOMY;  Surgeon: Rogene Houston, MD;  [facility];  Service: Endoscopy;;  proximal transverse colon(cs); transverse colon polyp (cbiopsy);sigmoid colon polyp(cb);   PROSTATE ABLATION  12-2015 at [place] Left 01/18/2016   Procedure: SHOULDER ACROMIOPLASTY;  Surgeon: Milly Jakob, MD;  Location: [facility];  Service: Orthopedics;  Laterality: Left;   SHOULDER ARTHROSCOPY WITH ROTATOR CUFF REPAIR AND SUBACROMIAL DECOMPRESSION Left 01/18/2016   Procedure: LEFT SHOULDER ARTHROSCOPY WITH ROTATOR CUFF REPAIR AND SUBACROMIAL DECOMPRESSION;  Surgeon: Milly Jakob, MD;  Location: [facility];  Service: Orthopedics;  Laterality: Left;  PRE-OP BLOCK WITH GENERAL ANESTHESIA    Social History   Socioeconomic History   Marital status: Divorced    Spouse name: Not on file   Number of children: 1   Years of education: Not on file   Highest education level: Not on file  Occupational History   Occupation: Full time Personal assistant: [employer]   Occupation: disability  Tobacco Use   Smoking status: Every Day    Packs/day: 0.25    Years: 28.00  Total pack years: 7.00    Types: Cigarettes   Smokeless tobacco: Never   Tobacco comments:    Smokes 3 cigs a day. 08/03/2022 Tay  Vaping Use   Vaping Use: Never used  Substance and Sexual Activity   Alcohol use: No   Drug use: No   Sexual activity: Not on file  Other Topics Concern   Not on file  Social History Narrative   Divorced   05/31/2021 - has a 32 year old son who lives with his mother, but he visits him every evening.   Social Determinants of Health   Financial Resource Strain: Low Risk  (06/01/2022)   Overall Financial Resource Strain (CARDIA)    Difficulty of Paying Living Expenses: Not hard at all  Food  Insecurity: No Food Insecurity (06/01/2022)   Hunger Vital Sign    Worried About Running Out of Food in the Last Year: Never true    Ran Out of Food in the Last Year: Never true  Transportation Needs: No Transportation Needs (06/01/2022)   PRAPARE - Hydrologist (Medical): No    Lack of Transportation (Non-Medical): No  Physical Activity: Insufficiently Active (06/01/2022)   Exercise Vital Sign    Days of Exercise per Week: 3 days    Minutes of Exercise per Session: 40 min  Stress: No Stress Concern Present (06/01/2022)   [place]    Feeling of Stress : Not at all  Social Connections: Moderately Isolated (06/01/2022)   Social Connection and Isolation Panel [NHANES]    Frequency of Communication with Friends and Family: More than three times a week    Frequency of Social Gatherings with Friends and Family: Three times a week    Attends Religious Services: More than 4 times per year    Active Member of Clubs or Organizations: No    Attends Archivist Meetings: Never    Marital Status: Divorced  Human resources officer Violence: Not At Risk (06/01/2022)   Humiliation, Afraid, Rape, and Kick questionnaire    Fear of Current or Ex-Partner: No    Emotionally Abused: No    Physically Abused: No    Sexually Abused: No     No Known Allergies   Outpatient Medications Prior to Visit  Medication Sig Dispense Refill   buPROPion (WELLBUTRIN XL) 150 MG 24 hr tablet Take 1 tablet (150 mg total) by mouth daily. 90 tablet 0   famotidine (PEPCID) 20 MG tablet Take 1 tablet (20 mg total) by mouth 2 (two) times daily. 180 tablet 3   gabapentin (NEURONTIN) 300 MG capsule Take 1 capsule (300 mg total) by mouth daily. 90 capsule 3   hydrochlorothiazide (HYDRODIURIL) 25 MG tablet Take 1 tablet (25 mg total) by mouth daily. 90 tablet 3   meloxicam (MOBIC) 15 MG tablet Take 1 tablet (15 mg total) by mouth daily. 90  tablet 3   metoprolol tartrate (LOPRESSOR) 25 MG tablet Take 1 tablet (25 mg total) by mouth 2 (two) times daily. 180 tablet 3   Multiple Vitamins-Minerals (MULTIVITAMIN PO) Take 1 tablet by mouth daily.     Omega-3 Fatty Acids (FISH OIL) 1000 MG CAPS Take 1,000 mg by mouth daily.     Pitavastatin Calcium (LIVALO) 2 MG TABS Take 1 tablet (2 mg total) by mouth daily. Patient to bring new insurance card. 90 tablet 3   potassium chloride SA (KLOR-CON M) 20 MEQ tablet Take 1 tablet (20 mEq  total) by mouth daily. 90 tablet 3   rosuvastatin (CRESTOR) 20 MG tablet Take 20 mg by mouth daily.     SHINGRIX injection      sildenafil (VIAGRA) 100 MG tablet TAKE 1/2 TO 1 TABLET BY MOUTH DAILY AS NEEDED FOR ERECTILE DYSFUNCTION 20 tablet 3   No facility-administered medications prior to visit.    ROS   Objective:  Physical Exam   Vitals:   08/03/22 1033  BP: 110/80  Pulse: (!) 52  SpO2: 97%  Weight: 181 lb (82.1 kg)  Height: 5' 10.5" (1.791 m)   97% on RA BMI Readings from Last 3 Encounters:  08/03/22 25.60 kg/m  06/15/22 25.54 kg/m  06/01/22 24.97 kg/m   Wt Readings from Last 3 Encounters:  08/03/22 181 lb (82.1 kg)  06/15/22 178 lb (80.7 kg)  06/01/22 174 lb (78.9 kg)     CBC    Component Value Date/Time   WBC 7.1 06/15/2022 1102   WBC 7.9 01/11/2021 1509   RBC 5.53 06/15/2022 1102   RBC 5.62 01/11/2021 1509   HGB 14.4 06/15/2022 1102   HCT 43.7 06/15/2022 1102   PLT 187 06/15/2022 1102   MCV 79 06/15/2022 1102   MCH 26.0 (L) 06/15/2022 1102   MCH 26.3 01/11/2021 1509   MCHC 33.0 06/15/2022 1102   MCHC 32.4 01/11/2021 1509   RDW 13.6 06/15/2022 1102   LYMPHSABS 1.4 06/15/2022 1102   MONOABS 0.7 01/11/2021 1509   EOSABS 0.1 06/15/2022 1102   BASOSABS 0.0 06/15/2022 1102     Chest Imaging:  Lung cancer screening CT September 2023: Centrilobular emphysema Small 4 mm left upper lobe subpleural pulmonary nodule. The patient's images have been independently  reviewed by me.    Pulmonary Functions Testing Results:     No data to display          FeNO:   Pathology:   Echocardiogram:   Heart Catheterization:     Assessment & Plan:     ICD-10-CM   1. Nodule of upper lobe of left lung  R91.1 CT CHEST NODULE FOLLOW UP LOW DOSE W/O    Pulmonary function test    CANCELED: CT CHEST NODULE FOLLOW UP LOW DOSE W/O    2. H/O renal cell cancer  Z85.528     3. Tobacco use  Z72.0     4. Encounter for smoking cessation counseling  Z71.6     5. Centrilobular emphysema (HCC)  J43.2       Discussion:  Today in the office we discussed his lung cancer screening CT and small upper lobe subpleural pulmonary nodule.  He needs a repeat noncontrasted CT chest in 6 months.  We spent most of the time today counseling on smoking cessation.  Plan: Start [employer] for nicotine patches Okay to continue to supplement as needed nicotine cravings with gum. Encouraged him to continue to taper off of cigarette use. Repeat noncontrasted CT scan of the chest in 6 months I have ordered this. Have follow-up in our office afterwards. We will get pulmonary function test complete prior to next office visit.    Current Outpatient Medications:    nicotine (NICODERM CQ - DOSED IN MG/24 HR) 7 mg/24hr patch, Place 2 patches (14 mg total) onto the skin daily., Disp: 30 patch, Rfl: 1   varenicline (CHANTIX) 1 MG tablet, Take 1 tablet (1 mg total) by mouth 2 (two) times daily. Start with 0.5 Tab BID for 1 week prior to increasing to '1mg'$  BID,  Disp: 60 tablet, Rfl: 1   buPROPion (WELLBUTRIN XL) 150 MG 24 hr tablet, Take 1 tablet (150 mg total) by mouth daily., Disp: 90 tablet, Rfl: 0   famotidine (PEPCID) 20 MG tablet, Take 1 tablet (20 mg total) by mouth 2 (two) times daily., Disp: 180 tablet, Rfl: 3   gabapentin (NEURONTIN) 300 MG capsule, Take 1 capsule (300 mg total) by mouth daily., Disp: 90 capsule, Rfl: 3   hydrochlorothiazide (HYDRODIURIL) 25 MG tablet,  Take 1 tablet (25 mg total) by mouth daily., Disp: 90 tablet, Rfl: 3   meloxicam (MOBIC) 15 MG tablet, Take 1 tablet (15 mg total) by mouth daily., Disp: 90 tablet, Rfl: 3   metoprolol tartrate (LOPRESSOR) 25 MG tablet, Take 1 tablet (25 mg total) by mouth 2 (two) times daily., Disp: 180 tablet, Rfl: 3   Multiple Vitamins-Minerals (MULTIVITAMIN PO), Take 1 tablet by mouth daily., Disp: , Rfl:    Omega-3 Fatty Acids (FISH OIL) 1000 MG CAPS, Take 1,000 mg by mouth daily., Disp: , Rfl:    Pitavastatin Calcium (LIVALO) 2 MG TABS, Take 1 tablet (2 mg total) by mouth daily. Patient to bring new insurance card., Disp: 90 tablet, Rfl: 3   potassium chloride SA (KLOR-CON M) 20 MEQ tablet, Take 1 tablet (20 mEq total) by mouth daily., Disp: 90 tablet, Rfl: 3   rosuvastatin (CRESTOR) 20 MG tablet, Take 20 mg by mouth daily., Disp: , Rfl:    SHINGRIX injection, , Disp: , Rfl:    sildenafil (VIAGRA) 100 MG tablet, TAKE 1/2 TO 1 TABLET BY MOUTH DAILY AS NEEDED FOR ERECTILE DYSFUNCTION, Disp: 20 tablet, Rfl: 3    Garner Nash, DO [facility] 08/03/2022 11:02 AM

## 2022-08-03 NOTE — Patient Instructions (Addendum)
Thank you for visiting Dr. Valeta Harms at [facility]. Today we recommend the following:  Orders Placed This Encounter  Procedures   CT CHEST NODULE FOLLOW UP LOW DOSE W/O   Pulmonary function test   Meds ordered this encounter  Medications   varenicline (CHANTIX) 1 MG tablet    Sig: Take 1 tablet (1 mg total) by mouth 2 (two) times daily. Start with 0.5 Tab BID for 1 week prior to increasing to '1mg'$  BID    Dispense:  60 tablet    Refill:  1   nicotine (NICODERM CQ - DOSED IN MG/24 HR) 7 mg/24hr patch    Sig: Place 2 patches (14 mg total) onto the skin daily.    Dispense:  30 patch    Refill:  1   Return in about 6 months (around 02/02/2023) for with Eric Form, NP, or Dr. Valeta Harms. After ct chest complete     Please do your part to reduce the spread of COVID-19.

## 2022-10-14 ENCOUNTER — Ambulatory Visit: Payer: PPO | Admitting: Family Medicine

## 2022-11-08 ENCOUNTER — Other Ambulatory Visit: Payer: Self-pay | Admitting: Family Medicine

## 2022-11-08 DIAGNOSIS — I10 Essential (primary) hypertension: Secondary | ICD-10-CM

## 2022-11-09 ENCOUNTER — Other Ambulatory Visit: Payer: Self-pay

## 2022-11-09 DIAGNOSIS — Z87891 Personal history of nicotine dependence: Secondary | ICD-10-CM

## 2022-11-09 DIAGNOSIS — Z122 Encounter for screening for malignant neoplasm of respiratory organs: Secondary | ICD-10-CM

## 2022-11-09 NOTE — Progress Notes (Unsigned)
LDCT order placed per protocol, scan scheduled for

## 2022-11-23 ENCOUNTER — Encounter: Payer: Self-pay | Admitting: Family Medicine

## 2022-11-23 ENCOUNTER — Ambulatory Visit (INDEPENDENT_AMBULATORY_CARE_PROVIDER_SITE_OTHER): Payer: PPO | Admitting: Family Medicine

## 2022-11-23 VITALS — BP 130/80 | HR 80 | Ht 70.5 in | Wt 181.0 lb

## 2022-11-23 DIAGNOSIS — J439 Emphysema, unspecified: Secondary | ICD-10-CM | POA: Diagnosis not present

## 2022-11-23 DIAGNOSIS — E782 Mixed hyperlipidemia: Secondary | ICD-10-CM | POA: Diagnosis not present

## 2022-11-23 DIAGNOSIS — I10 Essential (primary) hypertension: Secondary | ICD-10-CM | POA: Diagnosis not present

## 2022-11-23 DIAGNOSIS — R7303 Prediabetes: Secondary | ICD-10-CM

## 2022-11-23 DIAGNOSIS — F17210 Nicotine dependence, cigarettes, uncomplicated: Secondary | ICD-10-CM

## 2022-11-23 DIAGNOSIS — R911 Solitary pulmonary nodule: Secondary | ICD-10-CM | POA: Diagnosis not present

## 2022-11-23 LAB — BAYER DCA HB A1C WAIVED: HB A1C (BAYER DCA - WAIVED): 6.1 % — ABNORMAL HIGH (ref 4.8–5.6)

## 2022-11-23 MED ORDER — ROSUVASTATIN CALCIUM 20 MG PO TABS: 20.0000 mg | ORAL_TABLET | Freq: Every day | ORAL | 3 refills | Status: DC

## 2022-11-23 MED ORDER — HYDROCHLOROTHIAZIDE 25 MG PO TABS: 25.0000 mg | ORAL_TABLET | Freq: Every day | ORAL | 3 refills | Status: DC

## 2022-11-23 MED ORDER — POTASSIUM CHLORIDE CRYS ER 20 MEQ PO TBCR: 20.0000 meq | EXTENDED_RELEASE_TABLET | Freq: Every day | ORAL | 1 refills | Status: DC

## 2022-11-23 MED ORDER — METOPROLOL TARTRATE 25 MG PO TABS: 25.0000 mg | ORAL_TABLET | Freq: Two times a day (BID) | ORAL | 3 refills | Status: DC

## 2022-11-23 MED ORDER — GABAPENTIN 300 MG PO CAPS: 300.0000 mg | ORAL_CAPSULE | Freq: Every day | ORAL | 3 refills | Status: DC

## 2022-11-23 MED ORDER — BUPROPION HCL ER (XL) 150 MG PO TB24: 150.0000 mg | ORAL_TABLET | Freq: Every day | ORAL | 1 refills | Status: DC

## 2022-11-23 MED ORDER — MELOXICAM 15 MG PO TABS: 15.0000 mg | ORAL_TABLET | Freq: Every day | ORAL | 3 refills | Status: DC

## 2022-11-23 NOTE — Progress Notes (Signed)
BP 130/80   Pulse 80   Ht 5' 10.5" (1.791 m)   Wt 181 lb (82.1 kg)   SpO2 97%   BMI 25.60 kg/m    Subjective:   Patient ID: [name] Foster., male    DOB: 1955-12-29, 67 y.o.   MRN: [id number]  HPI: [name] Foster. is a 67 y.o. male presenting on 11/23/2022 for Discuss CT (Nodule seen in September 2023. Pt has follow up CT in March 2024)   HPI Prediabetes Patient comes in today for recheck of his diabetes. Patient has been currently taking no medication. Patient is not currently on an ACE inhibitor/ARB. Patient has not seen an ophthalmologist this year. Patient denies any issues with their feet. The symptom started onset as an adult hypertension and hyperlipidemia ARE RELATED TO DM   Hypertension Patient is currently on metoprolol and hydrochlorothiazide, and their blood pressure today is 130/80. Patient denies any lightheadedness or dizziness. Patient denies headaches, blurred vision, chest pains, shortness of breath, or weakness. Denies any side effects from medication and is content with current medication.   Hyperlipidemia Patient is coming in for recheck of his hyperlipidemia. The patient is currently taking Crestor. They deny any issues with myalgias or history of liver damage from it. They deny any focal numbness or weakness or chest pain.   Patient has a pulmonary nodule and has a CT follow-up in March.  He did see pulmonology for this as well.  Relevant past medical, surgical, family and social history reviewed and updated as indicated. Interim medical history since our last visit reviewed. Allergies and medications reviewed and updated.  Review of Systems  Constitutional:  Negative for chills and fever.  HENT:  Negative for ear pain and tinnitus.   Eyes:  Negative for pain and discharge.  Respiratory:  Negative for cough, shortness of breath and wheezing.   Cardiovascular:  Negative for chest pain, palpitations and leg swelling.  Gastrointestinal:  Negative for abdominal  pain, blood in stool, constipation and diarrhea.  Genitourinary:  Negative for dysuria and hematuria.  Musculoskeletal:  Negative for back pain, gait problem and myalgias.  Skin:  Negative for rash.  Neurological:  Negative for dizziness, weakness and headaches.  Psychiatric/Behavioral:  Negative for suicidal ideas.   All other systems reviewed and are negative.   Per HPI unless specifically indicated above   Allergies as of 11/23/2022   No Known Allergies      Medication List        Accurate as of November 23, 2022  3:53 PM. If you have any questions, ask your nurse or doctor.          STOP taking these medications    Livalo 2 MG Tabs Generic drug: Pitavastatin Calcium Stopped by: Fransisca Kaufmann Abdo Denault, MD       TAKE these medications    buPROPion 150 MG 24 hr tablet Commonly known as: Wellbutrin XL Take 1 tablet (150 mg total) by mouth daily.   famotidine 20 MG tablet Commonly known as: PEPCID Take 1 tablet (20 mg total) by mouth 2 (two) times daily.   Fish Oil 1000 MG Caps Take 1,000 mg by mouth daily.   gabapentin 300 MG capsule Commonly known as: NEURONTIN Take 1 capsule (300 mg total) by mouth daily.   hydrochlorothiazide 25 MG tablet Commonly known as: HYDRODIURIL Take 1 tablet (25 mg total) by mouth daily.   meloxicam 15 MG tablet Commonly known as: MOBIC Take 1 tablet (15 mg total) by mouth  daily.   metoprolol tartrate 25 MG tablet Commonly known as: LOPRESSOR Take 1 tablet (25 mg total) by mouth 2 (two) times daily.   MULTIVITAMIN PO Take 1 tablet by mouth daily.   nicotine 7 mg/24hr patch Commonly known as: NICODERM CQ - dosed in mg/24 hr Place 2 patches (14 mg total) onto the skin daily.   NONFORMULARY OR COMPOUNDED ITEM Take 1 Dose by mouth daily. Memory Vitamin   NONFORMULARY OR COMPOUNDED ITEM Take 1 Dose by mouth daily. Beets   potassium chloride SA 20 MEQ tablet Commonly known as: KLOR-CON M Take 1 tablet (20 mEq total) by  mouth daily.   rosuvastatin 20 MG tablet Commonly known as: CRESTOR Take 1 tablet (20 mg total) by mouth daily.   Shingrix injection Generic drug: Zoster Vaccine Adjuvanted   sildenafil 100 MG tablet Commonly known as: VIAGRA TAKE 1/2 TO 1 TABLET BY MOUTH DAILY AS NEEDED FOR ERECTILE DYSFUNCTION   varenicline 1 MG tablet Commonly known as: CHANTIX Take 1 tablet (1 mg total) by mouth 2 (two) times daily. Start with 0.5 Tab BID for 1 week prior to increasing to '1mg'$  BID         Objective:   BP 130/80   Pulse 80   Ht 5' 10.5" (1.791 m)   Wt 181 lb (82.1 kg)   SpO2 97%   BMI 25.60 kg/m   Wt Readings from Last 3 Encounters:  11/23/22 181 lb (82.1 kg)  08/03/22 181 lb (82.1 kg)  06/15/22 178 lb (80.7 kg)    Physical Exam Vitals reviewed.  Constitutional:      General: He is not in acute distress.    Appearance: He is well-developed. He is not diaphoretic.  HENT:     Right Ear: External ear normal.     Left Ear: External ear normal.     Nose: Nose normal.     Mouth/Throat:     Pharynx: No oropharyngeal exudate.  Eyes:     General: No scleral icterus.    Conjunctiva/sclera: Conjunctivae normal.  Neck:     Thyroid: No thyromegaly.  Cardiovascular:     Rate and Rhythm: Normal rate and regular rhythm.     Heart sounds: Normal heart sounds. No murmur heard. Pulmonary:     Effort: Pulmonary effort is normal. No respiratory distress.     Breath sounds: Normal breath sounds. No wheezing.  Abdominal:     General: Bowel sounds are normal. There is no distension.     Palpations: Abdomen is soft.     Tenderness: There is no abdominal tenderness. There is no guarding or rebound.  Musculoskeletal:        General: No swelling. Normal range of motion.     Cervical back: Neck supple.  Lymphadenopathy:     Cervical: No cervical adenopathy.  Skin:    General: Skin is warm and dry.     Findings: No rash.  Neurological:     Mental Status: He is alert and oriented to person,  place, and time.     Coordination: Coordination normal.  Psychiatric:        Behavior: Behavior normal.       Assessment & Plan:   Problem List Items Addressed This Visit       Cardiovascular and Mediastinum   Essential hypertension - Primary   Relevant Medications   hydrochlorothiazide (HYDRODIURIL) 25 MG tablet   metoprolol tartrate (LOPRESSOR) 25 MG tablet   potassium chloride SA (KLOR-CON M) 20 MEQ tablet  rosuvastatin (CRESTOR) 20 MG tablet   Other Relevant Orders   CBC with Differential/Platelet     Other   Hyperlipidemia   Relevant Medications   hydrochlorothiazide (HYDRODIURIL) 25 MG tablet   metoprolol tartrate (LOPRESSOR) 25 MG tablet   rosuvastatin (CRESTOR) 20 MG tablet   Other Relevant Orders   CMP14+EGFR   Lipid panel   Prediabetes   Relevant Orders   Bayer DCA Hb A1c Waived   Other Visit Diagnoses     Pulmonary nodule less than 6 mm determined by computed tomography of lung       Smokes with greater than 30 pack year history       Relevant Medications   buPROPion (WELLBUTRIN XL) 150 MG 24 hr tablet   Pulmonary emphysema, unspecified emphysema type (HCC)       Relevant Medications   buPROPion (WELLBUTRIN XL) 150 MG 24 hr tablet     Continue current medicine, no changes.  He will have CT scan follow-up  Follow up plan: Return in about 6 months (around 05/24/2023), or if symptoms worsen or fail to improve, for Prediabetes hypertension and cholesterol.  Counseling provided for all of the vaccine components Orders Placed This Encounter  Procedures   Bayer Sully Hb A1c Waived   CBC with Differential/Platelet   CMP14+EGFR   Lipid panel    Caryl Pina, MD [facility] 11/23/2022, 3:53 PM

## 2022-11-24 LAB — LIPID PANEL
Chol/HDL Ratio: 2.7 ratio (ref 0.0–5.0)
Cholesterol, Total: 90 mg/dL — ABNORMAL LOW (ref 100–199)
HDL: 33 mg/dL — ABNORMAL LOW (ref >39)
LDL Chol Calc (NIH): 41 mg/dL (ref 0–99)
Triglycerides: 78 mg/dL (ref 0–149)
VLDL Cholesterol Cal: 16 mg/dL (ref 5–40)

## 2022-11-24 LAB — CMP14+EGFR
ALT: 32 IU/L (ref 0–44)
AST: 22 IU/L (ref 0–40)
Albumin/Globulin Ratio: 2 (ref 1.2–2.2)
Albumin: 4.9 g/dL (ref 3.9–4.9)
Alkaline Phosphatase: 122 IU/L — ABNORMAL HIGH (ref 44–121)
BUN/Creatinine Ratio: 16 (ref 10–24)
BUN: 15 mg/dL (ref 8–27)
Bilirubin Total: 0.3 mg/dL (ref 0.0–1.2)
CO2: 21 mmol/L (ref 20–29)
Calcium: 9.6 mg/dL (ref 8.6–10.2)
Chloride: 102 mmol/L (ref 96–106)
Creatinine, Ser: 0.94 mg/dL (ref 0.76–1.27)
Globulin, Total: 2.4 g/dL (ref 1.5–4.5)
Glucose: 125 mg/dL — ABNORMAL HIGH (ref 70–99)
Potassium: 3.8 mmol/L (ref 3.5–5.2)
Sodium: 140 mmol/L (ref 134–144)
Total Protein: 7.3 g/dL (ref 6.0–8.5)
eGFR: 91 mL/min/{1.73_m2} (ref >59)

## 2022-11-24 LAB — CBC WITH DIFFERENTIAL/PLATELET
Basophils Absolute: 0 10*3/uL (ref 0.0–0.2)
Basos: 1 %
EOS (ABSOLUTE): 0.1 10*3/uL (ref 0.0–0.4)
Eos: 1 %
Hematocrit: 47.3 % (ref 37.5–51.0)
Hemoglobin: 15.4 g/dL (ref 13.0–17.7)
Immature Grans (Abs): 0 10*3/uL (ref 0.0–0.1)
Immature Granulocytes: 0 %
Lymphocytes Absolute: 2.1 10*3/uL (ref 0.7–3.1)
Lymphs: 24 %
MCH: 26 pg — ABNORMAL LOW (ref 26.6–33.0)
MCHC: 32.6 g/dL (ref 31.5–35.7)
MCV: 80 fL (ref 79–97)
Monocytes Absolute: 0.8 10*3/uL (ref 0.1–0.9)
Monocytes: 9 %
Neutrophils Absolute: 5.8 10*3/uL (ref 1.4–7.0)
Neutrophils: 65 %
Platelets: 194 10*3/uL (ref 150–450)
RBC: 5.93 x10E6/uL — ABNORMAL HIGH (ref 4.14–5.80)
RDW: 13.3 % (ref 11.6–15.4)
WBC: 8.9 10*3/uL (ref 3.4–10.8)

## 2022-12-08 NOTE — Telephone Encounter (Signed)
Encounter close

## 2023-01-01 ENCOUNTER — Other Ambulatory Visit: Payer: Self-pay | Admitting: Family Medicine

## 2023-01-02 ENCOUNTER — Telehealth: Payer: Self-pay | Admitting: Family Medicine

## 2023-01-02 MED ORDER — ROSUVASTATIN CALCIUM 20 MG PO TABS: 20.0000 mg | ORAL_TABLET | Freq: Every day | ORAL | 3 refills | Status: DC

## 2023-01-02 NOTE — Telephone Encounter (Signed)
Refills sent to [facility] in [place].

## 2023-01-02 NOTE — Telephone Encounter (Signed)
Please send patients ROSUVASTATIN Rx to [facility] in [place]. He can get this Rx much cheaper there.

## 2023-01-05 ENCOUNTER — Ambulatory Visit (HOSPITAL_COMMUNITY)
Admission: RE | Admit: 2023-01-05 | Discharge: 2023-01-05 | Disposition: A | Discharge status: Home / Self Care | Payer: PPO | Source: Ambulatory Visit | Attending: Physician Assistant | Admitting: Physician Assistant

## 2023-01-05 DIAGNOSIS — Z122 Encounter for screening for malignant neoplasm of respiratory organs: Secondary | ICD-10-CM | POA: Diagnosis not present

## 2023-01-05 DIAGNOSIS — R918 Other nonspecific abnormal finding of lung field: Secondary | ICD-10-CM | POA: Diagnosis not present

## 2023-01-05 DIAGNOSIS — J432 Centrilobular emphysema: Secondary | ICD-10-CM | POA: Diagnosis not present

## 2023-01-05 DIAGNOSIS — Z87891 Personal history of nicotine dependence: Secondary | ICD-10-CM | POA: Insufficient documentation

## 2023-01-12 ENCOUNTER — Ambulatory Visit: Payer: No Typology Code available for payment source | Admitting: Nurse Practitioner

## 2023-01-20 ENCOUNTER — Telehealth: Payer: Self-pay | Admitting: Family Medicine

## 2023-01-20 NOTE — Telephone Encounter (Signed)
Dr. Warrick Parisian will you please review Lung CT from 3/7?

## 2023-01-23 NOTE — Telephone Encounter (Signed)
Pt has been informed and understood. He has no concerns at this time.   Has a breathing test ordered and will contact pulmonology about scheduling.

## 2023-01-23 NOTE — Telephone Encounter (Signed)
I do not know why this did not come to my inbox to review there but looking at the CT scan it shows multiple small lung nodules at the base of the lungs consistent with scarring, have not increased in size at all.  He does have a little bit of thickening of the aorta which is normal for age.  Repeat screening in 1 year to follow these nodules.

## 2023-01-25 ENCOUNTER — Telehealth: Payer: Self-pay | Admitting: Pulmonary Disease

## 2023-01-25 NOTE — Telephone Encounter (Signed)
Pt. Calling to ask Dr. Valeta Harms if he saw anything serious on the ct he had made apt in may to have pft and see him on same day

## 2023-01-26 NOTE — Telephone Encounter (Signed)
ATC patient. LVMTCB. 

## 2023-02-01 NOTE — Telephone Encounter (Signed)
ATC X1 LVM for patient to call the office back 

## 2023-02-01 NOTE — Telephone Encounter (Signed)
Pt called the office checking on the results of the CT. Please advise.

## 2023-02-01 NOTE — Telephone Encounter (Signed)
Could we please try reach out to pt. Again at CT results

## 2023-02-02 NOTE — Telephone Encounter (Signed)
Attempted to call patient in regards for CT results.

## 2023-02-03 NOTE — Progress Notes (Signed)
Patient notified of LDCT Lung Cancer Screening Results via mail with the recommendation to follow-up in 12 months. Patient's referring provider has been sent a copy of results. Results are as follows:  IMPRESSION: 1. Lung-RADS 2, benign appearance or behavior. Continue annual screening with low-dose chest CT without contrast in 12 months. 2. Aortic Atherosclerosis (ICD10-I70.0) and Emphysema (ICD10-J43.9). 

## 2023-02-15 NOTE — Telephone Encounter (Signed)
Due to multiple attempts trying to call pt to give him the CT results, since pt has mychart sent him a Clinical cytogeneticist message of the results. Closing encounter due to multiple attempts trying to call pt.

## 2023-03-09 ENCOUNTER — Ambulatory Visit: Payer: BC Managed Care – PPO | Admitting: Pulmonary Disease

## 2023-03-23 ENCOUNTER — Encounter: Payer: Self-pay | Admitting: Pulmonary Disease

## 2023-05-13 ENCOUNTER — Other Ambulatory Visit: Payer: Self-pay | Admitting: Family Medicine

## 2023-05-25 ENCOUNTER — Encounter: Payer: PPO | Admitting: Family Medicine

## 2023-06-16 ENCOUNTER — Ambulatory Visit: Payer: BC Managed Care – PPO

## 2023-06-16 VITALS — Ht 70.0 in | Wt 178.0 lb

## 2023-06-16 DIAGNOSIS — Z0100 Encounter for examination of eyes and vision without abnormal findings: Secondary | ICD-10-CM

## 2023-06-16 DIAGNOSIS — Z0001 Encounter for general adult medical examination with abnormal findings: Secondary | ICD-10-CM

## 2023-06-16 DIAGNOSIS — Z0000 Encounter for general adult medical examination without abnormal findings: Secondary | ICD-10-CM

## 2023-06-16 DIAGNOSIS — F17210 Nicotine dependence, cigarettes, uncomplicated: Secondary | ICD-10-CM | POA: Diagnosis not present

## 2023-06-16 NOTE — Progress Notes (Signed)
Subjective:   [name] Krister Shire. is a 67 y.o. male who presents for Medicare Annual/Subsequent preventive examination.  Visit Complete: Virtual  I connected with  [name] Foster. on 06/16/23 by a audio enabled telemedicine application and verified that I am speaking with the correct person using two identifiers.  Patient Location: Home  Provider Location: Home Office  I discussed the limitations of evaluation and management by telemedicine. The patient expressed understanding and agreed to proceed.  Patient Medicare AWV questionnaire was completed by the patient on 06/16/2023; I have confirmed that all information answered by patient is correct and no changes since this date.  Review of Systems    Vital Signs: Unable to obtain new vitals due to this being a telehealth visit.  Cardiac Risk Factors include: advanced age (>5men, >62 women);smoking/ tobacco exposure;male gender;hypertension;dyslipidemia     Objective:    Today's Vitals   06/16/23 1103  Weight: 178 lb (80.7 kg)  Height: 5\' 10"  (1.778 m)   Body mass index is 25.54 kg/m.     06/16/2023   11:07 AM 06/01/2022    2:31 PM 05/31/2021    4:40 PM 01/11/2021   12:57 PM 04/10/2020    7:14 AM 04/08/2020   11:45 AM 09/12/2016    2:15 PM  Advanced Directives  Does Patient Have a Medical Advance Directive? Yes No No No Yes No Yes  Type of Estate agent of [place];Living will      Living will  Does patient want to make changes to medical advance directive?       No - Patient declined  Copy of Healthcare Power of Attorney in Chart? No - copy requested      No - copy requested  Would patient like information on creating a medical advance directive?  No - Patient declined No - Patient declined No - Patient declined  No - Patient declined     Current Medications (verified) Outpatient Encounter Medications as of 06/16/2023  Medication Sig   buPROPion (WELLBUTRIN XL) 150 MG 24 hr tablet Take 1 tablet (150 mg total)  by mouth daily.   famotidine (PEPCID) 20 MG tablet Take 1 tablet (20 mg total) by mouth 2 (two) times daily.   gabapentin (NEURONTIN) 300 MG capsule Take 1 capsule (300 mg total) by mouth daily.   hydrochlorothiazide (HYDRODIURIL) 25 MG tablet Take 1 tablet (25 mg total) by mouth daily.   meloxicam (MOBIC) 15 MG tablet Take 1 tablet (15 mg total) by mouth daily.   metoprolol tartrate (LOPRESSOR) 25 MG tablet Take 1 tablet (25 mg total) by mouth 2 (two) times daily.   Multiple Vitamins-Minerals (MULTIVITAMIN PO) Take 1 tablet by mouth daily.   nicotine (NICODERM CQ - DOSED IN MG/24 HR) 7 mg/24hr patch Place 2 patches (14 mg total) onto the skin daily.   NONFORMULARY OR COMPOUNDED ITEM Take 1 Dose by mouth daily. Memory Vitamin   NONFORMULARY OR COMPOUNDED ITEM Take 1 Dose by mouth daily. Beets   Omega-3 Fatty Acids (FISH OIL) 1000 MG CAPS Take 1,000 mg by mouth daily.   potassium chloride SA (KLOR-CON M) 20 MEQ tablet Take 1 tablet (20 mEq total) by mouth daily.   rosuvastatin (CRESTOR) 20 MG tablet Take 1 tablet (20 mg total) by mouth daily.   SHINGRIX injection    sildenafil (VIAGRA) 100 MG tablet TAKE 1/2 TO 1 TABLET BY MOUTH DAILY AS NEEDED FOR ERECTILE DYSFUNCTION   varenicline (CHANTIX) 1 MG tablet Take 1 tablet (1 mg  total) by mouth 2 (two) times daily. Start with 0.5 Tab BID for 1 week prior to increasing to 1mg  BID   No facility-administered encounter medications on file as of 06/16/2023.    Allergies (verified) Patient has no known allergies.   History: Past Medical History:  Diagnosis Date   Abnormal EKG    Apical variant hypertrophic cardiomyopathy (HCC)    Benign essential hypertension    BPH (benign prostatic hyperplasia)    Hypokalemia    Tobacco abuse    Past Surgical History:  Procedure Laterality Date   CARDIAC CATHETERIZATION     2008   COLONOSCOPY N/A 02/26/2015   Procedure: COLONOSCOPY;  Surgeon: Malissa Hippo, MD;  [facility]: [facility];  Service:  Endoscopy;  Laterality: N/A;  830   COLONOSCOPY WITH PROPOFOL N/A 04/10/2020   Procedure: COLONOSCOPY WITH PROPOFOL;  Surgeon: Malissa Hippo, MD;  Location: [facility];  Service: Endoscopy;  Laterality: N/A;  830   INGUINAL HERNIA REPAIR Right 01/13/2021   Procedure: HERNIA REPAIR INGUINAL ADULT;  Surgeon: Franky Macho, MD;  Location: [facility];  Service: General;  Laterality: Right;   KIDNEY SURGERY Right April 2015   benign tumor removal   POLYPECTOMY  04/10/2020   Procedure: POLYPECTOMY;  Surgeon: Malissa Hippo, MD;  Location: [facility];  Service: Endoscopy;;  proximal transverse colon(cs); transverse colon polyp (cbiopsy);sigmoid colon polyp(cb);   PROSTATE ABLATION  12-2015 at [facility]   SHOULDER ACROMIOPLASTY Left 01/18/2016   Procedure: SHOULDER ACROMIOPLASTY;  Surgeon: Mack Hook, MD;  Location: [facility];  Service: Orthopedics;  Laterality: Left;   SHOULDER ARTHROSCOPY WITH ROTATOR CUFF REPAIR AND SUBACROMIAL DECOMPRESSION Left 01/18/2016   Procedure: LEFT SHOULDER ARTHROSCOPY WITH ROTATOR CUFF REPAIR AND SUBACROMIAL DECOMPRESSION;  Surgeon: Mack Hook, MD;  Location: [facility];  Service: Orthopedics;  Laterality: Left;  PRE-OP BLOCK WITH GENERAL ANESTHESIA   Family History  Problem Relation Age of Onset   Diabetes Mother    Diabetes Other    Hypertension Other    Social History   Socioeconomic History   Marital status: Divorced    Spouse name: Not on file   Number of children: 1   Years of education: Not on file   Highest education level: Not on file  Occupational History   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 08/03/2022 Tay  Vaping Use   Vaping  status: Never Used  Substance and Sexual Activity   Alcohol use: No   Drug use: No   Sexual activity: Not on file  Other Topics Concern   Not on file  Social History Narrative   Divorced   05/31/2021 - has a 51 year old son who lives with his mother, but he visits him every evening.   Social Determinants of Health   Financial Resource Strain: Low Risk  (06/16/2023)   Overall Financial Resource Strain (CARDIA)    Difficulty of Paying Living Expenses: Not hard at all  Food Insecurity: No Food Insecurity (06/16/2023)   Hunger Vital Sign    Worried About Running Out of Food in the Last Year: Never true    Ran Out  of Food in the Last Year: Never true  Transportation Needs: No Transportation Needs (06/16/2023)   PRAPARE - Administrator, Civil Service (Medical): No    Lack of Transportation (Non-Medical): No  Physical Activity: Insufficiently Active (06/16/2023)   Exercise Vital Sign    Days of Exercise per Week: 3 days    Minutes of Exercise per Session: 30 min  Stress: No Stress Concern Present (06/16/2023)   [employer] of Occupational Health - Occupational Stress Questionnaire    Feeling of Stress : Not at all  Social Connections: Moderately Isolated (06/16/2023)   Social Connection and Isolation Panel [NHANES]    Frequency of Communication with Friends and Family: More than three times a week    Frequency of Social Gatherings with Friends and Family: More than three times a week    Attends Religious Services: More than 4 times per year    Active Member of [employer] or Organizations: No    Attends Banker Meetings: Never    Marital Status: Divorced    Tobacco Counseling Ready to quit: No Counseling given: Not Answered Tobacco comments: Smokes 3 cigs a day. 08/03/2022 Tay   Clinical Intake:  Pre-visit preparation completed: Yes  Pain : No/denies pain     Nutritional Risks: None Diabetes: No  How often do you need to have someone help you when  you read instructions, pamphlets, or other written materials from your doctor or pharmacy?: 1 - Never  Interpreter Needed?: No  Information entered by :: Renie Ora, LPN   Activities of Daily Living    06/16/2023   11:08 AM  In your present state of health, do you have any difficulty performing the following activities:  Hearing? 0  Vision? 0  Difficulty concentrating or making decisions? 0  Walking or climbing stairs? 0  Dressing or bathing? 0  Doing errands, shopping? 0  Preparing Food and eating ? N  Using the Toilet? N  In the past six months, have you accidently leaked urine? N  Do you have problems with loss of bowel control? N  Managing your Medications? N  Managing your Finances? N  Housekeeping or managing your Housekeeping? N    Patient Care Team: Dettinger, Elige Radon, MD as PCP - General (Family Medicine)  Indicate any recent Medical Services you may have received from other than [facility] providers in the past year (date may be approximate).     Assessment:   This is a routine wellness examination for Jashaun.  Hearing/Vision screen Vision Screening - Comments:: Referral 06/16/2023  Dietary issues and exercise activities discussed:     Goals Addressed             This Visit's Progress    DIET - EAT MORE FRUITS AND VEGETABLES   On track      Depression Screen    06/16/2023   11:06 AM 11/23/2022    3:57 PM 06/15/2022   10:51 AM 06/01/2022    2:30 PM 12/16/2021   10:36 AM 06/17/2021   10:36 AM 06/10/2021    8:00 AM  PHQ 2/9 Scores  PHQ - 2 Score 0 0 0 0 0 0 0  PHQ- 9 Score  0 0 0 0      Fall Risk    06/16/2023   11:05 AM 11/23/2022    3:56 PM 06/15/2022   10:51 AM 06/01/2022    2:31 PM 12/16/2021   10:36 AM  Fall Risk   Falls in the past  year? 0 0 0 0 0  Number falls in past yr: 0   0   Injury with Fall? 0   0   Risk for fall due to : No Fall Risks   No Fall Risks   Follow up Falls prevention discussed   Falls evaluation completed     MEDICARE  RISK AT HOME:  Medicare Risk at Home - 06/16/23 1105     Any stairs in or around the home? No    If so, are there any without handrails? No    Home free of loose throw rugs in walkways, pet beds, electrical cords, etc? Yes    Adequate lighting in your home to reduce risk of falls? Yes    Life alert? No    Use of a cane, walker or w/c? No    Grab bars in the bathroom? Yes    Shower chair or bench in shower? Yes    Elevated toilet seat or a handicapped toilet? Yes             TIMED UP AND GO:  Was the test performed?  No    Cognitive Function:        06/16/2023   11:08 AM 06/01/2022    2:32 PM 05/31/2021    4:40 PM  6CIT Screen  What Year? 0 points 0 points 0 points  What month? 0 points 0 points 0 points  What time? 0 points 0 points 0 points  Count back from 20 0 points 0 points 0 points  Months in reverse 0 points 0 points 0 points  Repeat phrase 0 points 0 points 0 points  Total Score 0 points 0 points 0 points    Immunizations Immunization History  Administered Date(s) Administered   Influenza Inj Mdck Quad Pf 10/04/2017, 08/15/2018, 08/09/2021   Influenza Inj Mdck Quad With Preservative 07/26/2019   Influenza Split 07/18/2022   Influenza,inj,Quad PF,6+ Mos 08/11/2015   Influenza-Unspecified 10/04/2017, 10/04/2017, 08/15/2018, 07/26/2019, 08/22/2020, 08/09/2021   Moderna Covid-19 Vaccine Bivalent Booster 104yrs & up 08/02/2021   Moderna Sars-Covid-2 Vaccination 01/24/2020, 02/25/2020, 09/23/2020   Tdap 06/04/2013   Zoster Recombinant(Shingrix) 12/16/2021, 06/15/2022    TDAP status: Due, Education has been provided regarding the importance of this vaccine. Advised may receive this vaccine at local pharmacy or Health Dept. Aware to provide a copy of the vaccination record if obtained from local pharmacy or Health Dept. Verbalized acceptance and understanding.  Flu Vaccine status: Due, Education has been provided regarding the importance of this vaccine. Advised may  receive this vaccine at local pharmacy or Health Dept. Aware to provide a copy of the vaccination record if obtained from local pharmacy or Health Dept. Verbalized acceptance and understanding.  Pneumococcal vaccine status: Due, Education has been provided regarding the importance of this vaccine. Advised may receive this vaccine at local pharmacy or Health Dept. Aware to provide a copy of the vaccination record if obtained from local pharmacy or Health Dept. Verbalized acceptance and understanding.  Covid-19 vaccine status: Completed vaccines  Qualifies for Shingles Vaccine? Yes   Zostavax completed Yes   Shingrix Completed?: Yes  Screening Tests Health Maintenance  Topic Date Due   Pneumonia Vaccine 104+ Years old (1 of 2 - PCV) Never done   COVID-19 Vaccine (5 - 2023-24 season) 07/01/2022   DTaP/Tdap/Td (2 - Td or Tdap) 06/05/2023   INFLUENZA VACCINE  06/01/2023   Lung Cancer Screening  01/05/2024   Medicare Annual Wellness (AWV)  06/15/2024   Colonoscopy  04/10/2025   Hepatitis C Screening  Completed   HIV Screening  Completed   Zoster Vaccines- Shingrix  Completed   HPV VACCINES  Aged Out    Health Maintenance  Health Maintenance Due  Topic Date Due   Pneumonia Vaccine 47+ Years old (1 of 2 - PCV) Never done   COVID-19 Vaccine (5 - 2023-24 season) 07/01/2022   DTaP/Tdap/Td (2 - Td or Tdap) 06/05/2023   INFLUENZA VACCINE  06/01/2023    Colorectal cancer screening: Type of screening: Colonoscopy. Completed 04/10/2020. Repeat every 5 years  Lung Cancer Screening: (Low Dose CT Chest recommended if Age 24-80 years, 20 pack-year currently smoking OR have quit w/in 15years.) does qualify.   Lung Cancer Screening Referral: completed 01/05/2023  Additional Screening:  Hepatitis C Screening: does not qualify;  Vision Screening: Recommended annual ophthalmology exams for early detection of glaucoma and other disorders of the eye. Is the patient up to date with their annual eye  exam?  No  Who is the provider or what is the name of the office in which the patient attends annual eye exams? None referral 06/16/2023 If pt is not established with a provider, would they like to be referred to a provider to establish care? No .   Dental Screening: Recommended annual dental exams for proper oral hygiene    Community Resource Referral / Chronic Care Management: CRR required this visit?  No   CCM required this visit?  No     Plan:     I have personally reviewed and noted the following in the patient's chart:   Medical and social history Use of alcohol, tobacco or illicit drugs  Current medications and supplements including opioid prescriptions. Patient is not currently taking opioid prescriptions. Functional ability and status Nutritional status Physical activity Advanced directives List of other physicians Hospitalizations, surgeries, and ER visits in previous 12 months Vitals Screenings to include cognitive, depression, and falls Referrals and appointments  In addition, I have reviewed and discussed with patient certain preventive protocols, quality metrics, and best practice recommendations. A written personalized care plan for preventive services as well as general preventive health recommendations were provided to patient.     Lorrene Reid, LPN   02/06/8118   After Visit Summary: (MyChart) Due to this being a telephonic visit, the after visit summary with patients personalized plan was offered to patient via MyChart   Nurse Notes: Due Tdap /Pneumonia Vaccine

## 2023-06-16 NOTE — Patient Instructions (Signed)
Mr. [name] Foster , Thank you for taking time to come for your Medicare Wellness Visit. I appreciate your ongoing commitment to your health goals. Please review the following plan we discussed and let me know if I can assist you in the future.   Referrals/Orders/Follow-Ups/Clinician Recommendations: Aim for 30 minutes of exercise or brisk walking, 6-8 glasses of water, and 5 servings of fruits and vegetables each day.   This is a list of the screening recommended for you and due dates:  Health Maintenance  Topic Date Due   Pneumonia Vaccine (1 of 2 - PCV) Never done   COVID-19 Vaccine (5 - 2023-24 season) 07/01/2022   DTaP/Tdap/Td vaccine (2 - Td or Tdap) 06/05/2023   Flu Shot  06/01/2023   Screening for Lung Cancer  01/05/2024   Medicare Annual Wellness Visit  06/15/2024   Colon Cancer Screening  04/10/2025   Hepatitis C Screening  Completed   HIV Screening  Completed   Zoster (Shingles) Vaccine  Completed   HPV Vaccine  Aged Out    Advanced directives: (Copy Requested) Please bring a copy of your health care power of attorney and living will to the office to be added to your chart at your convenience.  Next Medicare Annual Wellness Visit scheduled for next year: Yes  Preventive Care 23 Years and Older, Male  Preventive care refers to lifestyle choices and visits with your health care provider that can promote health and wellness. What does preventive care include? A yearly physical exam. This is also called an annual well check. Dental exams once or twice a year. Routine eye exams. Ask your health care provider how often you should have your eyes checked. Personal lifestyle choices, including: Daily care of your teeth and gums. Regular physical activity. Eating a healthy diet. Avoiding tobacco and drug use. Limiting alcohol use. Practicing safe sex. Taking low doses of aspirin every day. Taking vitamin and mineral supplements as recommended by your health care provider. What happens  during an annual well check? The services and screenings done by your health care provider during your annual well check will depend on your age, overall health, lifestyle risk factors, and family history of disease. Counseling  Your health care provider may ask you questions about your: Alcohol use. Tobacco use. Drug use. Emotional well-being. Home and relationship well-being. Sexual activity. Eating habits. History of falls. Memory and ability to understand (cognition). Work and work Astronomer. Screening  You may have the following tests or measurements: Height, weight, and BMI. Blood pressure. Lipid and cholesterol levels. These may be checked every 5 years, or more frequently if you are over 35 years old. Skin check. Lung cancer screening. You may have this screening every year starting at age 76 if you have a 30-pack-year history of smoking and currently smoke or have quit within the past 15 years. Fecal occult blood test (FOBT) of the stool. You may have this test every year starting at age 41. Flexible sigmoidoscopy or colonoscopy. You may have a sigmoidoscopy every 5 years or a colonoscopy every 10 years starting at age 65. Prostate cancer screening. Recommendations will vary depending on your family history and other risks. Hepatitis C blood test. Hepatitis B blood test. Sexually transmitted disease (STD) testing. Diabetes screening. This is done by checking your blood sugar (glucose) after you have not eaten for a while (fasting). You may have this done every 1-3 years. Abdominal aortic aneurysm (AAA) screening. You may need this if you are a current or former  smoker. Osteoporosis. You may be screened starting at age 79 if you are at high risk. Talk with your health care provider about your test results, treatment options, and if necessary, the need for more tests. Vaccines  Your health care provider may recommend certain vaccines, such as: Influenza vaccine. This is  recommended every year. Tetanus, diphtheria, and acellular pertussis (Tdap, Td) vaccine. You may need a Td booster every 10 years. Zoster vaccine. You may need this after age 64. Pneumococcal 13-valent conjugate (PCV13) vaccine. One dose is recommended after age 66. Pneumococcal polysaccharide (PPSV23) vaccine. One dose is recommended after age 54. Talk to your health care provider about which screenings and vaccines you need and how often you need them. This information is not intended to replace advice given to you by your health care provider. Make sure you discuss any questions you have with your health care provider. Document Released: 11/13/2015 Document Revised: 07/06/2016 Document Reviewed: 08/18/2015 Elsevier Interactive Patient Education  2017 [employer].  Fall Prevention in the Home Falls can cause injuries. They can happen to people of all ages. There are many things you can do to make your home safe and to help prevent falls. What can I do on the outside of my home? Regularly fix the edges of walkways and driveways and fix any cracks. Remove anything that might make you trip as you walk through a door, such as a raised step or threshold. Trim any bushes or trees on the path to your home. Use bright outdoor lighting. Clear any walking paths of anything that might make someone trip, such as rocks or tools. Regularly check to see if handrails are loose or broken. Make sure that both sides of any steps have handrails. Any raised decks and porches should have guardrails on the edges. Have any leaves, snow, or ice cleared regularly. Use sand or salt on walking paths during winter. Clean up any spills in your garage right away. This includes oil or grease spills. What can I do in the bathroom? Use night lights. Install grab bars by the toilet and in the tub and shower. Do not use towel bars as grab bars. Use non-skid mats or decals in the tub or shower. If you need to sit down in  the shower, use a plastic, non-slip stool. Keep the floor dry. Clean up any water that spills on the floor as soon as it happens. Remove soap buildup in the tub or shower regularly. Attach bath mats securely with double-sided non-slip rug tape. Do not have throw rugs and other things on the floor that can make you trip. What can I do in the bedroom? Use night lights. Make sure that you have a light by your bed that is easy to reach. Do not use any sheets or blankets that are too big for your bed. They should not hang down onto the floor. Have a firm chair that has side arms. You can use this for support while you get dressed. Do not have throw rugs and other things on the floor that can make you trip. What can I do in the kitchen? Clean up any spills right away. Avoid walking on wet floors. Keep items that you use a lot in easy-to-reach places. If you need to reach something above you, use a strong step stool that has a grab bar. Keep electrical cords out of the way. Do not use floor polish or wax that makes floors slippery. If you must use wax, use non-skid  floor wax. Do not have throw rugs and other things on the floor that can make you trip. What can I do with my stairs? Do not leave any items on the stairs. Make sure that there are handrails on both sides of the stairs and use them. Fix handrails that are broken or loose. Make sure that handrails are as long as the stairways. Check any carpeting to make sure that it is firmly attached to the stairs. Fix any carpet that is loose or worn. Avoid having throw rugs at the top or bottom of the stairs. If you do have throw rugs, attach them to the floor with carpet tape. Make sure that you have a light switch at the top of the stairs and the bottom of the stairs. If you do not have them, ask someone to add them for you. What else can I do to help prevent falls? Wear shoes that: Do not have high heels. Have rubber bottoms. Are comfortable  and fit you well. Are closed at the toe. Do not wear sandals. If you use a stepladder: Make sure that it is fully opened. Do not climb a closed stepladder. Make sure that both sides of the stepladder are locked into place. Ask someone to hold it for you, if possible. Clearly mark and make sure that you can see: Any grab bars or handrails. First and last steps. Where the edge of each step is. Use tools that help you move around (mobility aids) if they are needed. These include: Canes. Walkers. Scooters. Crutches. Turn on the lights when you go into a dark area. Replace any light bulbs as soon as they burn out. Set up your furniture so you have a clear path. Avoid moving your furniture around. If any of your floors are uneven, fix them. If there are any pets around you, be aware of where they are. Review your medicines with your doctor. Some medicines can make you feel dizzy. This can increase your chance of falling. Ask your doctor what other things that you can do to help prevent falls. This information is not intended to replace advice given to you by your health care provider. Make sure you discuss any questions you have with your health care provider. Document Released: 08/13/2009 Document Revised: 03/24/2016 Document Reviewed: 11/21/2014 Elsevier Interactive Patient Education  2017 [employer].

## 2023-06-26 ENCOUNTER — Ambulatory Visit (INDEPENDENT_AMBULATORY_CARE_PROVIDER_SITE_OTHER): Payer: Medicare Other | Admitting: Family Medicine

## 2023-06-26 ENCOUNTER — Encounter: Payer: Self-pay | Admitting: Family Medicine

## 2023-06-26 VITALS — BP 123/80 | HR 79 | Ht 70.0 in | Wt 184.0 lb

## 2023-06-26 DIAGNOSIS — Z23 Encounter for immunization: Secondary | ICD-10-CM | POA: Diagnosis not present

## 2023-06-26 DIAGNOSIS — R7303 Prediabetes: Secondary | ICD-10-CM

## 2023-06-26 DIAGNOSIS — W460XXA Contact with hypodermic needle, initial encounter: Secondary | ICD-10-CM | POA: Diagnosis not present

## 2023-06-26 DIAGNOSIS — R059 Cough, unspecified: Secondary | ICD-10-CM

## 2023-06-26 DIAGNOSIS — I10 Essential (primary) hypertension: Secondary | ICD-10-CM

## 2023-06-26 DIAGNOSIS — E782 Mixed hyperlipidemia: Secondary | ICD-10-CM | POA: Diagnosis not present

## 2023-06-26 DIAGNOSIS — J439 Emphysema, unspecified: Secondary | ICD-10-CM

## 2023-06-26 DIAGNOSIS — F17210 Nicotine dependence, cigarettes, uncomplicated: Secondary | ICD-10-CM | POA: Diagnosis not present

## 2023-06-26 DIAGNOSIS — Z85528 Personal history of other malignant neoplasm of kidney: Secondary | ICD-10-CM | POA: Diagnosis not present

## 2023-06-26 LAB — CMP14+EGFR
ALT: 32 IU/L (ref 0–44)
AST: 24 IU/L (ref 0–40)
Albumin: 4.2 g/dL (ref 3.9–4.9)
Alkaline Phosphatase: 110 IU/L (ref 44–121)
BUN/Creatinine Ratio: 12 (ref 10–24)
BUN: 12 mg/dL (ref 8–27)
Bilirubin Total: 0.3 mg/dL (ref 0.0–1.2)
CO2: 21 mmol/L (ref 20–29)
Calcium: 8.9 mg/dL (ref 8.6–10.2)
Chloride: 108 mmol/L — ABNORMAL HIGH (ref 96–106)
Creatinine, Ser: 1 mg/dL (ref 0.76–1.27)
Globulin, Total: 2.2 g/dL (ref 1.5–4.5)
Glucose: 87 mg/dL (ref 70–99)
Potassium: 4.3 mmol/L (ref 3.5–5.2)
Sodium: 142 mmol/L (ref 134–144)
Total Protein: 6.4 g/dL (ref 6.0–8.5)
eGFR: 84 mL/min/{1.73_m2} (ref >59)

## 2023-06-26 LAB — CBC WITH DIFFERENTIAL/PLATELET
Basophils Absolute: 0.1 10*3/uL (ref 0.0–0.2)
Basos: 1 %
EOS (ABSOLUTE): 0.1 10*3/uL (ref 0.0–0.4)
Eos: 2 %
Hematocrit: 44.2 % (ref 37.5–51.0)
Hemoglobin: 14.3 g/dL (ref 13.0–17.7)
Immature Grans (Abs): 0 10*3/uL (ref 0.0–0.1)
Immature Granulocytes: 0 %
Lymphocytes Absolute: 1.8 10*3/uL (ref 0.7–3.1)
Lymphs: 28 %
MCH: 25.9 pg — ABNORMAL LOW (ref 26.6–33.0)
MCHC: 32.4 g/dL (ref 31.5–35.7)
MCV: 80 fL (ref 79–97)
Monocytes Absolute: 0.7 10*3/uL (ref 0.1–0.9)
Monocytes: 11 %
Neutrophils Absolute: 3.8 10*3/uL (ref 1.4–7.0)
Neutrophils: 58 %
Platelets: 204 10*3/uL (ref 150–450)
RBC: 5.52 x10E6/uL (ref 4.14–5.80)
RDW: 13.4 % (ref 11.6–15.4)
WBC: 6.5 10*3/uL (ref 3.4–10.8)

## 2023-06-26 LAB — LIPID PANEL
Chol/HDL Ratio: 2.5 ratio (ref 0.0–5.0)
Cholesterol, Total: 86 mg/dL — ABNORMAL LOW (ref 100–199)
HDL: 34 mg/dL — ABNORMAL LOW (ref >39)
LDL Chol Calc (NIH): 36 mg/dL (ref 0–99)
Triglycerides: 77 mg/dL (ref 0–149)
VLDL Cholesterol Cal: 16 mg/dL (ref 5–40)

## 2023-06-26 LAB — BAYER DCA HB A1C WAIVED: HB A1C (BAYER DCA - WAIVED): 5.5 % (ref 4.8–5.6)

## 2023-06-26 MED ORDER — FAMOTIDINE 20 MG PO TABS
20.0000 mg | ORAL_TABLET | Freq: Two times a day (BID) | ORAL | 3 refills | Status: DC
Start: 2023-06-26 — End: 2024-06-25

## 2023-06-26 MED ORDER — BUPROPION HCL ER (XL) 150 MG PO TB24: 150.0000 mg | ORAL_TABLET | Freq: Every day | ORAL | 3 refills | Status: DC

## 2023-06-26 MED ORDER — POTASSIUM CHLORIDE CRYS ER 20 MEQ PO TBCR
20.0000 meq | EXTENDED_RELEASE_TABLET | Freq: Every day | ORAL | 3 refills | Status: DC
Start: 2023-06-26 — End: 2023-10-09

## 2023-06-26 NOTE — Progress Notes (Signed)
BP 123/80   Pulse 79   Ht 5\' 10"  (1.778 m)   Wt 184 lb (83.5 kg)   SpO2 98%   BMI 26.40 kg/m    Subjective:   Patient ID: [name] Foster., male    DOB: Jul 25, 1956, 67 y.o.   MRN: [id number]  HPI: [name] Foster. is a 67 y.o. male presenting on 06/26/2023 for Medical Management of Chronic Issues (CPE), Prediabetes, Hypertension, and Hyperlipidemia   HPI Physical exam Patient denies any chest pain, shortness of breath, headaches or vision issues, abdominal complaints, diarrhea, nausea, vomiting, or joint issues.   Prediabetes Patient comes in today for recheck of his diabetes. Patient has been currently taking no medicine currently. Patient is not currently on an ACE inhibitor/ARB. Patient has not seen an ophthalmologist this year. Patient denies any new issues with their feet. The symptom started onset as an adult hypertension and hyperlipidemia ARE RELATED TO DM   Hypertension Patient is currently on metoprolol and hydrochlorothiazide, and their blood pressure today is 123/80. Patient denies any lightheadedness or dizziness. Patient denies headaches, blurred vision, chest pains, shortness of breath, or weakness. Denies any side effects from medication and is content with current medication.   Hyperlipidemia Patient is coming in for recheck of his hyperlipidemia. The patient is currently taking Crestor and fish oils. They deny any issues with myalgias or history of liver damage from it. They deny any focal numbness or weakness or chest pain.   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  Relevant past medical, surgical, family and social history reviewed and updated as indicated. Interim medical history since our last visit reviewed. Allergies and medications reviewed and updated.  Review of Systems  Constitutional:   Negative for chills and fever.  HENT:  Negative for ear pain and tinnitus.   Eyes:  Negative for pain and visual disturbance.  Respiratory:  Negative for cough, shortness of breath and wheezing.   Cardiovascular:  Negative for chest pain, palpitations and leg swelling.  Gastrointestinal:  Negative for abdominal pain, blood in stool, constipation and diarrhea.  Genitourinary:  Negative for dysuria and hematuria.  Musculoskeletal:  Negative for back pain, gait problem and myalgias.  Skin:  Negative for rash.  Neurological:  Negative for dizziness, weakness and headaches.  Psychiatric/Behavioral:  Negative for suicidal ideas.   All other systems reviewed and are negative.   Per HPI unless specifically indicated above   Allergies as of 06/26/2023   No Known Allergies      Medication List        Accurate as of June 26, 2023 11:17 AM. If you have any questions, ask your nurse or doctor.          STOP taking these medications    Shingrix injection Generic drug: Zoster Vaccine Adjuvanted Stopped by: Elige Radon Cristo Ausburn       TAKE these medications    buPROPion 150 MG 24 hr tablet Commonly known as: Wellbutrin XL Take 1 tablet (150 mg total) by mouth daily.   famotidine 20 MG tablet Commonly known as: PEPCID Take 1 tablet (20 mg total) by mouth 2 (two) times daily.   Fish Oil 1000 MG Caps Take 1,000 mg by mouth daily.   gabapentin 300 MG capsule Commonly known as: NEURONTIN Take 1 capsule (300 mg total)  by mouth daily.   hydrochlorothiazide 25 MG tablet Commonly known as: HYDRODIURIL Take 1 tablet (25 mg total) by mouth daily.   meloxicam 15 MG tablet Commonly known as: MOBIC Take 1 tablet (15 mg total) by mouth daily.   metoprolol tartrate 25 MG tablet Commonly known as: LOPRESSOR Take 1 tablet (25 mg total) by mouth 2 (two) times daily.   MULTIVITAMIN PO Take 1 tablet by mouth daily.   nicotine 7 mg/24hr patch Commonly known as: NICODERM CQ - dosed  in mg/24 hr Place 2 patches (14 mg total) onto the skin daily.   NONFORMULARY OR COMPOUNDED ITEM Take 1 Dose by mouth daily. Memory Vitamin   NONFORMULARY OR COMPOUNDED ITEM Take 1 Dose by mouth daily. Beets   potassium chloride SA 20 MEQ tablet Commonly known as: KLOR-CON M Take 1 tablet (20 mEq total) by mouth daily.   rosuvastatin 20 MG tablet Commonly known as: CRESTOR Take 1 tablet (20 mg total) by mouth daily.   sildenafil 100 MG tablet Commonly known as: VIAGRA TAKE 1/2 TO 1 TABLET BY MOUTH DAILY AS NEEDED FOR ERECTILE DYSFUNCTION   varenicline 1 MG tablet Commonly known as: CHANTIX Take 1 tablet (1 mg total) by mouth 2 (two) times daily. Start with 0.5 Tab BID for 1 week prior to increasing to 1mg  BID         Objective:   BP 123/80   Pulse 79   Ht 5\' 10"  (1.778 m)   Wt 184 lb (83.5 kg)   SpO2 98%   BMI 26.40 kg/m   Wt Readings from Last 3 Encounters:  06/26/23 184 lb (83.5 kg)  06/16/23 178 lb (80.7 kg)  11/23/22 181 lb (82.1 kg)    Physical Exam Vitals reviewed.  Constitutional:      General: He is not in acute distress.    Appearance: He is well-developed. He is not diaphoretic.  HENT:     Right Ear: External ear normal.     Left Ear: External ear normal.     Nose: Nose normal.     Mouth/Throat:     Pharynx: No oropharyngeal exudate.  Eyes:     General: No scleral icterus.    Conjunctiva/sclera: Conjunctivae normal.  Neck:     Thyroid: No thyromegaly.  Cardiovascular:     Rate and Rhythm: Normal rate and regular rhythm.     Heart sounds: Normal heart sounds. No murmur heard. Pulmonary:     Effort: Pulmonary effort is normal. No respiratory distress.     Breath sounds: Normal breath sounds. No wheezing.  Abdominal:     General: Bowel sounds are normal. There is no distension.     Palpations: Abdomen is soft.     Tenderness: There is no abdominal tenderness. There is no guarding or rebound.  Musculoskeletal:        General: No swelling.  Normal range of motion.     Cervical back: Neck supple.  Lymphadenopathy:     Cervical: No cervical adenopathy.  Skin:    General: Skin is warm and dry.     Findings: No rash.  Neurological:     Mental Status: He is alert and oriented to person, place, and time.     Coordination: Coordination normal.  Psychiatric:        Behavior: Behavior normal.       Assessment & Plan:   Problem List Items Addressed This Visit       Cardiovascular and Mediastinum   Essential hypertension -  Primary   Relevant Medications   potassium chloride SA (KLOR-CON M) 20 MEQ tablet   Other Relevant Orders   CBC with Differential/Platelet   CMP14+EGFR   Lipid panel   Bayer DCA Hb A1c Waived     Other   Hyperlipidemia   Relevant Orders   CBC with Differential/Platelet   CMP14+EGFR   Lipid panel   Bayer DCA Hb A1c Waived   Prediabetes   Relevant Orders   CBC with Differential/Platelet   CMP14+EGFR   Lipid panel   Bayer DCA Hb A1c Waived   Other Visit Diagnoses     Smokes with greater than 30 pack year history       Relevant Medications   buPROPion (WELLBUTRIN XL) 150 MG 24 hr tablet   Pulmonary emphysema, unspecified emphysema type (HCC)       Relevant Medications   buPROPion (WELLBUTRIN XL) 150 MG 24 hr tablet   Cough, unspecified type       Relevant Medications   famotidine (PEPCID) 20 MG tablet   History of renal cell carcinoma       Relevant Orders   CT ABDOMEN PELVIS W CONTRAST   Accidental hypodermic needlestick injury       Relevant Orders   RPR   HepB+HepC+HIV Panel     Will order CT scan for history of renal cell carcinoma just declined the recheck on it, it has been since 2015 when he had the tumor removed.  Wanted to do STD checking but it looks like we could not add it to the blood work he already had drawn so he will just do it next time because of possible fingersticks at his previous profession.    Follow up plan: Return in about 6 months (around 12/27/2023),  or if symptoms worsen or fail to improve, for Prediabetes hypertension and cholesterol recheck.  Counseling provided for all of the vaccine components Orders Placed This Encounter  Procedures   CT ABDOMEN PELVIS W CONTRAST   CBC with Differential/Platelet   CMP14+EGFR   Lipid panel   Bayer DCA Hb A1c Waived   RPR   HepB+HepC+HIV Panel    Arville Care, MD [place] 06/26/2023, 11:17 AM

## 2023-06-30 NOTE — Addendum Note (Signed)
Addended by: Dorene Sorrow on: 06/30/2023 09:06 AM   Modules accepted: Orders

## 2023-07-04 ENCOUNTER — Ambulatory Visit (HOSPITAL_BASED_OUTPATIENT_CLINIC_OR_DEPARTMENT_OTHER)
Admission: RE | Admit: 2023-07-04 | Discharge: 2023-07-04 | Disposition: A | Discharge status: Home / Self Care | Payer: PPO | Source: Ambulatory Visit | Attending: Family Medicine | Admitting: Family Medicine

## 2023-07-04 DIAGNOSIS — Z85528 Personal history of other malignant neoplasm of kidney: Secondary | ICD-10-CM | POA: Insufficient documentation

## 2023-07-04 DIAGNOSIS — Z905 Acquired absence of kidney: Secondary | ICD-10-CM | POA: Diagnosis not present

## 2023-07-04 DIAGNOSIS — R932 Abnormal findings on diagnostic imaging of liver and biliary tract: Secondary | ICD-10-CM | POA: Diagnosis not present

## 2023-07-04 DIAGNOSIS — K5730 Diverticulosis of large intestine without perforation or abscess without bleeding: Secondary | ICD-10-CM | POA: Diagnosis not present

## 2023-07-04 MED ORDER — IOHEXOL 350 MG/ML SOLN
100.0000 mL | Freq: Once | INTRAVENOUS | Status: AC | PRN
  Administered 2023-07-04: 85 mL via INTRAVENOUS

## 2023-07-05 ENCOUNTER — Other Ambulatory Visit: Payer: Self-pay | Admitting: Pharmacist

## 2023-07-05 NOTE — Progress Notes (Signed)
Pharmacy Quality Measure Review  This patient is appearing on a report for being at risk of failing the adherence measure for cholesterol (statin) medications this calendar year.   Medication: rosuvastatin 20 mg Last fill date: 02/20/23 for 90 day supply  Called pt and he reported being out of the medication. Plans to call pharmacy tomorrow to request a refill.  Adam Phenix, PharmD PGY-1 Pharmacy Resident

## 2023-07-07 ENCOUNTER — Other Ambulatory Visit: Payer: Self-pay | Admitting: Family Medicine

## 2023-07-07 DIAGNOSIS — R059 Cough, unspecified: Secondary | ICD-10-CM

## 2023-07-11 ENCOUNTER — Other Ambulatory Visit: Payer: Self-pay

## 2023-07-11 DIAGNOSIS — K769 Liver disease, unspecified: Secondary | ICD-10-CM

## 2023-07-11 DIAGNOSIS — Z85528 Personal history of other malignant neoplasm of kidney: Secondary | ICD-10-CM

## 2023-08-03 ENCOUNTER — Encounter: Payer: PPO | Admitting: Family Medicine

## 2023-08-23 ENCOUNTER — Telehealth: Payer: Medicare Other | Admitting: Family Medicine

## 2023-08-23 ENCOUNTER — Encounter: Payer: Self-pay | Admitting: Family Medicine

## 2023-08-23 DIAGNOSIS — R0981 Nasal congestion: Secondary | ICD-10-CM | POA: Diagnosis not present

## 2023-08-23 MED ORDER — FLUTICASONE PROPIONATE 50 MCG/ACT NA SUSP: 1.0000 | Freq: Two times a day (BID) | NASAL | 1 refills | Status: AC | PRN

## 2023-08-23 MED ORDER — PROMETHAZINE-DM 6.25-15 MG/5ML PO SYRP: 5.0000 mL | ORAL_SOLUTION | Freq: Four times a day (QID) | ORAL | 1 refills | Status: DC | PRN

## 2023-08-23 NOTE — Progress Notes (Signed)
Virtual Visit via MyChart video note  I connected with [name] Foster. on 08/23/23 at 1449 by video and verified that I am speaking with the correct person using two identifiers. [name] Foster. is currently located at home and patient are currently with her during visit. The provider, Elige Radon Divante Kotch, MD is located in their office at time of visit.  Call ended at 1507  I discussed the limitations, risks, security and privacy concerns of performing an evaluation and management service by video and the availability of in person appointments. I also discussed with the patient that there may be a patient responsible charge related to this service. The patient expressed understanding and agreed to proceed.   History and Present Illness: Patient is calling in for congestion and his son had sinus infection last week.  He started last night with sinus congestion and now he has a cough that is dry.  He has sneezing. He is hoping to have a liquid cough medicine.  He denies shortness of breath or wheezing. He has taken nyquil and tylenol flu.  He is not coughing up anything.  He denies any fevers or chills.  He recently had both flu shot and COVID shot within the past month.  1. Sinus congestion     Outpatient Encounter Medications as of 08/23/2023  Medication Sig   fluticasone (FLONASE) 50 MCG/ACT nasal spray Place 1 spray into both nostrils 2 (two) times daily as needed for allergies or rhinitis.   promethazine-dextromethorphan (PROMETHAZINE-DM) 6.25-15 MG/5ML syrup Take 5 mLs by mouth 4 (four) times daily as needed for cough.   buPROPion (WELLBUTRIN XL) 150 MG 24 hr tablet Take 1 tablet (150 mg total) by mouth daily.   famotidine (PEPCID) 20 MG tablet Take 1 tablet (20 mg total) by mouth 2 (two) times daily.   gabapentin (NEURONTIN) 300 MG capsule Take 1 capsule (300 mg total) by mouth daily.   hydrochlorothiazide (HYDRODIURIL) 25 MG tablet Take 1 tablet (25 mg total) by mouth daily.   meloxicam  (MOBIC) 15 MG tablet Take 1 tablet (15 mg total) by mouth daily.   metoprolol tartrate (LOPRESSOR) 25 MG tablet Take 1 tablet (25 mg total) by mouth 2 (two) times daily.   Multiple Vitamins-Minerals (MULTIVITAMIN PO) Take 1 tablet by mouth daily.   nicotine (NICODERM CQ - DOSED IN MG/24 HR) 7 mg/24hr patch Place 2 patches (14 mg total) onto the skin daily.   NONFORMULARY OR COMPOUNDED ITEM Take 1 Dose by mouth daily. Memory Vitamin   NONFORMULARY OR COMPOUNDED ITEM Take 1 Dose by mouth daily. Beets   Omega-3 Fatty Acids (FISH OIL) 1000 MG CAPS Take 1,000 mg by mouth daily.   potassium chloride SA (KLOR-CON M) 20 MEQ tablet Take 1 tablet (20 mEq total) by mouth daily.   rosuvastatin (CRESTOR) 20 MG tablet Take 1 tablet (20 mg total) by mouth daily.   sildenafil (VIAGRA) 100 MG tablet TAKE 1/2 TO 1 TABLET BY MOUTH DAILY AS NEEDED FOR ERECTILE DYSFUNCTION   varenicline (CHANTIX) 1 MG tablet Take 1 tablet (1 mg total) by mouth 2 (two) times daily. Start with 0.5 Tab BID for 1 week prior to increasing to 1mg  BID   No facility-administered encounter medications on file as of 08/23/2023.    Review of Systems  Constitutional:  Negative for chills and fever.  HENT:  Positive for congestion, postnasal drip, rhinorrhea, sinus pressure and sneezing. Negative for ear discharge, ear pain, sore throat and voice change.   Eyes:  Negative for pain, discharge, redness and visual disturbance.  Respiratory:  Positive for cough. Negative for shortness of breath and wheezing.   Cardiovascular:  Negative for chest pain and leg swelling.  Musculoskeletal:  Negative for gait problem.  Skin:  Negative for rash.  All other systems reviewed and are negative.   Observations/Objective: Patient sounds comfortable and in no acute distress  Assessment and Plan: Problem List Items Addressed This Visit   None Visit Diagnoses     Sinus congestion    -  Primary   Relevant Medications   promethazine-dextromethorphan  (PROMETHAZINE-DM) 6.25-15 MG/5ML syrup   fluticasone (FLONASE) 50 MCG/ACT nasal spray       Will get cough syrup and Flonase and if anything worsens he is to call back. Follow up plan: Return if symptoms worsen or fail to improve.     I discussed the assessment and treatment plan with the patient. The patient was provided an opportunity to ask questions and all were answered. The patient agreed with the plan and demonstrated an understanding of the instructions.   The patient was advised to call back or seek an in-person evaluation if the symptoms worsen or if the condition fails to improve as anticipated.  The above assessment and management plan was discussed with the patient. The patient verbalized understanding of and has agreed to the management plan. Patient is aware to call the clinic if symptoms persist or worsen. Patient is aware when to return to the clinic for a follow-up visit. Patient educated on when it is appropriate to go to the emergency department.    I provided 18 minutes of non-face-to-face time during this encounter.    Nils Pyle, MD

## 2023-10-07 ENCOUNTER — Other Ambulatory Visit: Payer: Self-pay | Admitting: Family Medicine

## 2023-10-07 DIAGNOSIS — I10 Essential (primary) hypertension: Secondary | ICD-10-CM

## 2023-11-16 ENCOUNTER — Other Ambulatory Visit: Payer: Self-pay

## 2023-11-16 DIAGNOSIS — Z87891 Personal history of nicotine dependence: Secondary | ICD-10-CM

## 2023-11-16 DIAGNOSIS — Z122 Encounter for screening for malignant neoplasm of respiratory organs: Secondary | ICD-10-CM

## 2023-11-21 ENCOUNTER — Other Ambulatory Visit: Payer: Self-pay | Admitting: Family Medicine

## 2023-12-27 ENCOUNTER — Ambulatory Visit (INDEPENDENT_AMBULATORY_CARE_PROVIDER_SITE_OTHER): Payer: Medicare Other | Admitting: Family Medicine

## 2023-12-27 ENCOUNTER — Encounter: Payer: Self-pay | Admitting: Family Medicine

## 2023-12-27 VITALS — BP 128/81 | HR 90 | Ht 70.0 in | Wt 184.0 lb

## 2023-12-27 DIAGNOSIS — I10 Essential (primary) hypertension: Secondary | ICD-10-CM

## 2023-12-27 DIAGNOSIS — R7303 Prediabetes: Secondary | ICD-10-CM

## 2023-12-27 DIAGNOSIS — Z125 Encounter for screening for malignant neoplasm of prostate: Secondary | ICD-10-CM

## 2023-12-27 DIAGNOSIS — E782 Mixed hyperlipidemia: Secondary | ICD-10-CM | POA: Diagnosis not present

## 2023-12-27 DIAGNOSIS — I2089 Other forms of angina pectoris: Secondary | ICD-10-CM

## 2023-12-27 DIAGNOSIS — Z113 Encounter for screening for infections with a predominantly sexual mode of transmission: Secondary | ICD-10-CM | POA: Diagnosis not present

## 2023-12-27 LAB — BAYER DCA HB A1C WAIVED: HB A1C (BAYER DCA - WAIVED): 6.1 % — ABNORMAL HIGH (ref 4.8–5.6)

## 2023-12-27 MED ORDER — METOPROLOL TARTRATE 25 MG PO TABS
25.0000 mg | ORAL_TABLET | Freq: Two times a day (BID) | ORAL | 3 refills | Status: AC
Start: 2023-12-27 — End: ?

## 2023-12-27 MED ORDER — NITROGLYCERIN 0.4 MG SL SUBL: 0.4000 mg | SUBLINGUAL_TABLET | SUBLINGUAL | 1 refills | Status: AC | PRN

## 2023-12-27 MED ORDER — MELOXICAM 15 MG PO TABS: 15.0000 mg | ORAL_TABLET | Freq: Every day | ORAL | 3 refills | Status: AC

## 2023-12-27 MED ORDER — HYDROCHLOROTHIAZIDE 25 MG PO TABS: 25.0000 mg | ORAL_TABLET | Freq: Every day | ORAL | 3 refills | Status: DC

## 2023-12-27 MED ORDER — GABAPENTIN 300 MG PO CAPS: 300.0000 mg | ORAL_CAPSULE | Freq: Every day | ORAL | 3 refills | Status: AC

## 2023-12-27 MED ORDER — ROSUVASTATIN CALCIUM 20 MG PO TABS: 20.0000 mg | ORAL_TABLET | Freq: Every day | ORAL | 3 refills | Status: DC

## 2023-12-27 NOTE — Progress Notes (Signed)
 BP 128/81   Pulse 90   Ht 5\' 10"  (1.778 m)   Wt 184 lb (83.5 kg)   SpO2 96%   BMI 26.40 kg/m    Subjective:   Patient ID: [name] Meuse., male    DOB: Jun 04, 1956, 68 y.o.   MRN: [id number]  HPI: [name] Foster. is a 68 y.o. male presenting on 12/27/2023 for Medical Management of Chronic Issues, Hypertension, and Hyperlipidemia   HPI Hypertension Patient is currently on metoprolol and hydrochlorothiazide, and their blood pressure today is 112 8/81. Patient denies any lightheadedness or dizziness. Patient denies headaches, blurred vision, chest pains, shortness of breath, or weakness. Denies any side effects from medication and is content with current medication.   Hyperlipidemia Patient is coming in for recheck of his hyperlipidemia. The patient is currently taking fish oils and Crestor. They deny any issues with myalgias or history of liver damage from it. They deny any focal numbness or weakness or chest pain.   Prediabetes Patient comes in today for recheck of his diabetes. Patient has been currently taking no medication, diet control. Patient is not currently on an ACE inhibitor/ARB. Patient has not seen an ophthalmologist this year. Patient denies any new issues with their feet. The symptom started onset as an adult hypertension and hyperlipidemia ARE RELATED TO DM   Patient has had atypical angina in the past and has had a workup through cardiology a couple years ago and they just give him some nitro that he can use as needed.  He wants a refill on the nitro today.  Relevant past medical, surgical, family and social history reviewed and updated as indicated. Interim medical history since our last visit reviewed. Allergies and medications reviewed and updated.  Review of Systems  Constitutional:  Negative for chills and fever.  Eyes:  Negative for visual disturbance.  Respiratory:  Negative for shortness of breath and wheezing.   Cardiovascular:  Negative for chest pain and leg  swelling.  Musculoskeletal:  Negative for back pain and gait problem.  Skin:  Negative for rash.  Neurological:  Negative for dizziness and light-headedness.  All other systems reviewed and are negative.   Per HPI unless specifically indicated above   Allergies as of 12/27/2023   No Known Allergies      Medication List        Accurate as of December 27, 2023 10:51 AM. If you have any questions, ask your nurse or doctor.          buPROPion 150 MG 24 hr tablet Commonly known as: Wellbutrin XL Take 1 tablet (150 mg total) by mouth daily.   famotidine 20 MG tablet Commonly known as: PEPCID Take 1 tablet (20 mg total) by mouth 2 (two) times daily.   Fish Oil 1000 MG Caps Take 1,000 mg by mouth daily.   fluticasone 50 MCG/ACT nasal spray Commonly known as: FLONASE Place 1 spray into both nostrils 2 (two) times daily as needed for allergies or rhinitis.   gabapentin 300 MG capsule Commonly known as: NEURONTIN Take 1 capsule (300 mg total) by mouth daily.   hydrochlorothiazide 25 MG tablet Commonly known as: HYDRODIURIL Take 1 tablet (25 mg total) by mouth daily.   meloxicam 15 MG tablet Commonly known as: MOBIC Take 1 tablet (15 mg total) by mouth daily.   metoprolol tartrate 25 MG tablet Commonly known as: LOPRESSOR Take 1 tablet (25 mg total) by mouth 2 (two) times daily.   MULTIVITAMIN PO Take 1 tablet  by mouth daily.   nicotine 7 mg/24hr patch Commonly known as: NICODERM CQ - dosed in mg/24 hr Place 2 patches (14 mg total) onto the skin daily.   nitroGLYCERIN 0.4 MG SL tablet Commonly known as: NITROSTAT Place 1 tablet (0.4 mg total) under the tongue every 5 (five) minutes as needed for chest pain. Started by: Elige Radon Zillah Alexie   NONFORMULARY OR COMPOUNDED ITEM Take 1 Dose by mouth daily. Memory Vitamin   NONFORMULARY OR COMPOUNDED ITEM Take 1 Dose by mouth daily. Beets   potassium chloride SA 20 MEQ tablet Commonly known as: KLOR-CON M TAKE  ONE TABLET BY MOUTH ONCE DAILY   promethazine-dextromethorphan 6.25-15 MG/5ML syrup Commonly known as: PROMETHAZINE-DM Take 5 mLs by mouth 4 (four) times daily as needed for cough.   rosuvastatin 20 MG tablet Commonly known as: CRESTOR Take 1 tablet (20 mg total) by mouth daily.   sildenafil 100 MG tablet Commonly known as: VIAGRA TAKE 1/2 TO 1 TABLET BY MOUTH DAILY AS NEEDED FOR ERECTILE DYSFUNCTION   varenicline 1 MG tablet Commonly known as: CHANTIX Take 1 tablet (1 mg total) by mouth 2 (two) times daily. Start with 0.5 Tab BID for 1 week prior to increasing to 1mg  BID         Objective:   BP 128/81   Pulse 90   Ht 5\' 10"  (1.778 m)   Wt 184 lb (83.5 kg)   SpO2 96%   BMI 26.40 kg/m   Wt Readings from Last 3 Encounters:  12/27/23 184 lb (83.5 kg)  06/26/23 184 lb (83.5 kg)  06/16/23 178 lb (80.7 kg)    Physical Exam Vitals and nursing note reviewed.  Constitutional:      General: He is not in acute distress.    Appearance: He is well-developed. He is not diaphoretic.  Eyes:     General: No scleral icterus.    Conjunctiva/sclera: Conjunctivae normal.  Neck:     Thyroid: No thyromegaly.  Cardiovascular:     Rate and Rhythm: Normal rate and regular rhythm.     Heart sounds: Normal heart sounds. No murmur heard. Pulmonary:     Effort: Pulmonary effort is normal. No respiratory distress.     Breath sounds: Normal breath sounds. No wheezing.  Musculoskeletal:     Cervical back: Neck supple.  Lymphadenopathy:     Cervical: No cervical adenopathy.  Skin:    General: Skin is warm and dry.     Findings: No rash.  Neurological:     Mental Status: He is alert and oriented to person, place, and time.     Coordination: Coordination normal.  Psychiatric:        Behavior: Behavior normal.       Assessment & Plan:   Problem List Items Addressed This Visit       Cardiovascular and Mediastinum   Essential hypertension   Relevant Medications    hydrochlorothiazide (HYDRODIURIL) 25 MG tablet   metoprolol tartrate (LOPRESSOR) 25 MG tablet   rosuvastatin (CRESTOR) 20 MG tablet   nitroGLYCERIN (NITROSTAT) 0.4 MG SL tablet   Other Relevant Orders   CBC with Differential/Platelet     Other   Hyperlipidemia - Primary   Relevant Medications   hydrochlorothiazide (HYDRODIURIL) 25 MG tablet   metoprolol tartrate (LOPRESSOR) 25 MG tablet   rosuvastatin (CRESTOR) 20 MG tablet   nitroGLYCERIN (NITROSTAT) 0.4 MG SL tablet   Other Relevant Orders   CBC with Differential/Platelet   CMP14+EGFR   Lipid panel  Prediabetes   Relevant Orders   Bayer DCA Hb A1c Waived   Other Visit Diagnoses       Screening for STD (sexually transmitted disease)       Relevant Orders   HepB+HepC+HIV Panel     Prostate cancer screening       Relevant Orders   PSA, total and free     Atypical angina (HCC)       Relevant Medications   hydrochlorothiazide (HYDRODIURIL) 25 MG tablet   metoprolol tartrate (LOPRESSOR) 25 MG tablet   rosuvastatin (CRESTOR) 20 MG tablet   nitroGLYCERIN (NITROSTAT) 0.4 MG SL tablet       Continue current medicine, will do blood work today.  No changes Follow up plan: Return in about 6 months (around 06/25/2024), or if symptoms worsen or fail to improve, for Hypertension hyperlipidemia and prediabetes.  Counseling provided for all of the vaccine components Orders Placed This Encounter  Procedures   CBC with Differential/Platelet   CMP14+EGFR   Lipid panel   PSA, total and free   Bayer DCA Hb A1c Waived   HepB+HepC+HIV Panel    Arville Care, MD [place] [facility] 12/27/2023, 10:51 AM

## 2023-12-28 LAB — CBC WITH DIFFERENTIAL/PLATELET
Basophils Absolute: 0.1 10*3/uL (ref 0.0–0.2)
Basos: 1 %
EOS (ABSOLUTE): 0.1 10*3/uL (ref 0.0–0.4)
Eos: 2 %
Hematocrit: 46.1 % (ref 37.5–51.0)
Hemoglobin: 14.6 g/dL (ref 13.0–17.7)
Immature Grans (Abs): 0 10*3/uL (ref 0.0–0.1)
Immature Granulocytes: 0 %
Lymphocytes Absolute: 1.8 10*3/uL (ref 0.7–3.1)
Lymphs: 26 %
MCH: 26.2 pg — ABNORMAL LOW (ref 26.6–33.0)
MCHC: 31.7 g/dL (ref 31.5–35.7)
MCV: 83 fL (ref 79–97)
Monocytes Absolute: 0.7 10*3/uL (ref 0.1–0.9)
Monocytes: 9 %
Neutrophils Absolute: 4.3 10*3/uL (ref 1.4–7.0)
Neutrophils: 62 %
Platelets: 184 10*3/uL (ref 150–450)
RBC: 5.58 x10E6/uL (ref 4.14–5.80)
RDW: 13.6 % (ref 11.6–15.4)
WBC: 6.9 10*3/uL (ref 3.4–10.8)

## 2023-12-28 LAB — HEPB+HEPC+HIV PANEL
HIV Screen 4th Generation wRfx: NONREACTIVE
Hep B C IgM: NEGATIVE
Hep B Core Total Ab: NEGATIVE
Hep B E Ab: NONREACTIVE
Hep B E Ag: NEGATIVE
Hep B Surface Ab, Qual: NONREACTIVE
Hep C Virus Ab: NONREACTIVE
Hepatitis B Surface Ag: NEGATIVE

## 2023-12-28 LAB — CMP14+EGFR
ALT: 29 IU/L (ref 0–44)
AST: 23 IU/L (ref 0–40)
Albumin: 4.5 g/dL (ref 3.9–4.9)
Alkaline Phosphatase: 98 IU/L (ref 44–121)
BUN/Creatinine Ratio: 17 (ref 10–24)
BUN: 15 mg/dL (ref 8–27)
Bilirubin Total: 0.5 mg/dL (ref 0.0–1.2)
CO2: 21 mmol/L (ref 20–29)
Calcium: 9 mg/dL (ref 8.6–10.2)
Chloride: 109 mmol/L — ABNORMAL HIGH (ref 96–106)
Creatinine, Ser: 0.86 mg/dL (ref 0.76–1.27)
Globulin, Total: 2.2 g/dL (ref 1.5–4.5)
Glucose: 89 mg/dL (ref 70–99)
Potassium: 4.5 mmol/L (ref 3.5–5.2)
Sodium: 144 mmol/L (ref 134–144)
Total Protein: 6.7 g/dL (ref 6.0–8.5)
eGFR: 96 mL/min/{1.73_m2} (ref >59)

## 2023-12-28 LAB — LIPID PANEL
Chol/HDL Ratio: 3.1 ratio (ref 0.0–5.0)
Cholesterol, Total: 107 mg/dL (ref 100–199)
HDL: 34 mg/dL — ABNORMAL LOW (ref >39)
LDL Chol Calc (NIH): 59 mg/dL (ref 0–99)
Triglycerides: 61 mg/dL (ref 0–149)
VLDL Cholesterol Cal: 14 mg/dL (ref 5–40)

## 2023-12-28 LAB — PSA, TOTAL AND FREE
PSA, Free Pct: 32.4 %
PSA, Free: 0.55 ng/mL
Prostate Specific Ag, Serum: 1.7 ng/mL (ref 0.0–4.0)

## 2024-01-04 ENCOUNTER — Encounter: Payer: Self-pay | Admitting: Family Medicine

## 2024-01-05 ENCOUNTER — Other Ambulatory Visit: Payer: Self-pay | Admitting: Family Medicine

## 2024-01-05 DIAGNOSIS — I10 Essential (primary) hypertension: Secondary | ICD-10-CM

## 2024-01-06 ENCOUNTER — Other Ambulatory Visit: Payer: Self-pay | Admitting: Family Medicine

## 2024-01-06 DIAGNOSIS — E782 Mixed hyperlipidemia: Secondary | ICD-10-CM

## 2024-01-16 ENCOUNTER — Other Ambulatory Visit: Payer: Self-pay | Admitting: Family Medicine

## 2024-01-16 DIAGNOSIS — I10 Essential (primary) hypertension: Secondary | ICD-10-CM

## 2024-01-19 ENCOUNTER — Ambulatory Visit (HOSPITAL_COMMUNITY)
Admission: RE | Admit: 2024-01-19 | Discharge: 2024-01-19 | Disposition: A | Discharge status: Home / Self Care | Payer: BC Managed Care – PPO | Source: Ambulatory Visit | Attending: Oncology | Admitting: Oncology

## 2024-01-19 DIAGNOSIS — C642 Malignant neoplasm of left kidney, except renal pelvis: Secondary | ICD-10-CM | POA: Diagnosis not present

## 2024-01-19 DIAGNOSIS — Z85528 Personal history of other malignant neoplasm of kidney: Secondary | ICD-10-CM | POA: Insufficient documentation

## 2024-01-19 DIAGNOSIS — Z87891 Personal history of nicotine dependence: Secondary | ICD-10-CM | POA: Diagnosis not present

## 2024-01-19 DIAGNOSIS — Z905 Acquired absence of kidney: Secondary | ICD-10-CM | POA: Diagnosis not present

## 2024-01-19 DIAGNOSIS — Z122 Encounter for screening for malignant neoplasm of respiratory organs: Secondary | ICD-10-CM | POA: Insufficient documentation

## 2024-01-19 DIAGNOSIS — K769 Liver disease, unspecified: Secondary | ICD-10-CM | POA: Insufficient documentation

## 2024-01-19 DIAGNOSIS — N281 Cyst of kidney, acquired: Secondary | ICD-10-CM | POA: Diagnosis not present

## 2024-01-19 MED ORDER — IOHEXOL 300 MG/ML  SOLN
100.0000 mL | Freq: Once | INTRAMUSCULAR | Status: AC | PRN
  Administered 2024-01-19: 100 mL via INTRAVENOUS

## 2024-02-01 ENCOUNTER — Encounter: Payer: Self-pay | Admitting: Family Medicine

## 2024-02-13 NOTE — Progress Notes (Signed)
 Attempted to contact patient regarding LDCT results. Unable to reach the patient directly, VM left asking that the patient return my call.

## 2024-02-14 ENCOUNTER — Other Ambulatory Visit: Payer: Self-pay

## 2024-02-14 DIAGNOSIS — Z87891 Personal history of nicotine dependence: Secondary | ICD-10-CM

## 2024-02-14 DIAGNOSIS — Z122 Encounter for screening for malignant neoplasm of respiratory organs: Secondary | ICD-10-CM

## 2024-02-14 NOTE — Progress Notes (Signed)
 Patient notified of LDCT Lung Cancer Screening Results via telephone with the recommendation to follow-up in 3 months. Patient's referring provider has been sent a copy of results. Results are as follows:  IMPRESSION: 1. Lung-RADS 0, incomplete. Progression of masslike consolidative opacity in the medial right middle lobe is likely atelectatic or evolving scar, but obscures portions of the right lung. Follow-up lung cancer screening chest CT in 3 months recommended to re-evaluate. 2.  Emphysema. (ICD10-J43.9)

## 2024-02-26 ENCOUNTER — Other Ambulatory Visit: Payer: Self-pay | Admitting: Family Medicine

## 2024-02-26 DIAGNOSIS — E782 Mixed hyperlipidemia: Secondary | ICD-10-CM

## 2024-02-28 ENCOUNTER — Other Ambulatory Visit: Payer: Self-pay | Admitting: Family Medicine

## 2024-04-19 ENCOUNTER — Ambulatory Visit (HOSPITAL_COMMUNITY)
Admission: RE | Admit: 2024-04-19 | Discharge: 2024-04-19 | Disposition: A | Discharge status: Home / Self Care | Source: Ambulatory Visit | Attending: Oncology | Admitting: Oncology

## 2024-04-19 DIAGNOSIS — Z122 Encounter for screening for malignant neoplasm of respiratory organs: Secondary | ICD-10-CM | POA: Insufficient documentation

## 2024-04-19 DIAGNOSIS — F17210 Nicotine dependence, cigarettes, uncomplicated: Secondary | ICD-10-CM | POA: Diagnosis not present

## 2024-04-19 DIAGNOSIS — Z87891 Personal history of nicotine dependence: Secondary | ICD-10-CM | POA: Insufficient documentation

## 2024-04-29 ENCOUNTER — Encounter: Payer: Self-pay | Admitting: *Deleted

## 2024-04-29 NOTE — Progress Notes (Signed)
Patient notified via mail of LDCT l ung cancer screening results with recommendations to follow up in 12 months.  Also notified of incidental findings and need to follow up with PCP.  Patient's referring provider was sent a copy of results.    IMPRESSION: Lung-RADS 2, benign appearance or behavior. Continue annual screening with low-dose chest CT without contrast in 12 months.  Aortic Atherosclerosis (ICD10-I70.0) and Emphysema (ICD10-J43.9).   

## 2024-05-02 ENCOUNTER — Ambulatory Visit: Payer: Self-pay | Admitting: Family Medicine

## 2024-05-14 DIAGNOSIS — B078 Other viral warts: Secondary | ICD-10-CM | POA: Diagnosis not present

## 2024-05-14 DIAGNOSIS — L918 Other hypertrophic disorders of the skin: Secondary | ICD-10-CM | POA: Diagnosis not present

## 2024-06-18 ENCOUNTER — Ambulatory Visit (INDEPENDENT_AMBULATORY_CARE_PROVIDER_SITE_OTHER): Payer: No Typology Code available for payment source

## 2024-06-18 VITALS — BP 128/81 | HR 90 | Ht 70.0 in | Wt 184.0 lb

## 2024-06-18 DIAGNOSIS — Z0000 Encounter for general adult medical examination without abnormal findings: Secondary | ICD-10-CM | POA: Diagnosis not present

## 2024-06-18 NOTE — Patient Instructions (Signed)
 Mr. [name] Foster , Thank you for taking time out of your busy schedule to complete your Annual Wellness Visit with me. I enjoyed our conversation and look forward to speaking with you again next year. I, as well as your care team,  appreciate your ongoing commitment to your health goals. Please review the following plan we discussed and let me know if I can assist you in the future. Your Game plan/ To Do List    Referrals: If you haven't heard from the office you've been referred to, please reach out to them at the phone provided.   Follow up Visits: We will see or speak with you next year for your Next Medicare AWV with our clinical staff on 06/11/25 at  Have you seen your provider in the last 6 months (3 months if uncontrolled diabetes)? Yes  Clinician Recommendations:  Aim for 30 minutes of exercise or brisk walking, 6-8 glasses of water , and 5 servings of fruits and vegetables each day.       This is a list of the screenings recommended for you:  Health Maintenance  Topic Date Due   COVID-19 Vaccine (6 - 2024-25 season) 01/18/2024   Medicare Annual Wellness Visit  06/15/2024   Flu Shot  05/31/2024   Pneumococcal Vaccine for age over 70 (1 of 2 - PCV) 06/25/2024*   Colon Cancer Screening  04/10/2025   Screening for Lung Cancer  04/19/2025   DTaP/Tdap/Td vaccine (3 - Td or Tdap) 06/25/2033   Hepatitis C Screening  Completed   Zoster (Shingles) Vaccine  Completed   HPV Vaccine  Aged Out   Meningitis B Vaccine  Aged Out  *Topic was postponed. The date shown is not the original due date.    Advanced directives: (Declined) Advance directive discussed with you today. Even though you declined this today, please call our office should you change your mind, and we can give you the proper paperwork for you to fill out. Advance Care Planning is important because it:  [x]  Makes sure you receive the medical care that is consistent with your values, goals, and preferences  [x]  It provides guidance to  your family and loved ones and reduces their decisional burden about whether or not they are making the right decisions based on your wishes.  Follow the link provided in your after visit summary or read over the paperwork we have mailed to you to help you started getting your Advance Directives in place. If you need assistance in completing these, please reach out to us  so that we can help you!  See attachments for Preventive Care and Fall Prevention Tips.

## 2024-06-18 NOTE — Progress Notes (Signed)
 Subjective:   [name] Gallentine Jr. is a 68 y.o. who presents for a Medicare Wellness preventive visit.  As a reminder, Annual Wellness Visits don't include a physical exam, and some assessments may be limited, especially if this visit is performed virtually. We may recommend an in-person follow-up visit with your provider if needed.  Visit Complete: Virtual I connected with  [name] Schmiesing Jr. on 06/18/24 by a audio enabled telemedicine application and verified that I am speaking with the correct person using two identifiers.  Patient Location: Home  Provider Location: Home Office  I discussed the limitations of evaluation and management by telemedicine. The patient expressed understanding and agreed to proceed.  Vital Signs: Because this visit was a virtual/telehealth visit, some criteria may be missing or patient reported. Any vitals not documented were not able to be obtained and vitals that have been documented are patient reported.  VideoDeclined- This patient declined Librarian, academic. Therefore the visit was completed with audio only.  Persons Participating in Visit: Patient.  AWV Questionnaire: No: Patient Medicare AWV questionnaire was not completed prior to this visit.  Cardiac Risk Factors include: advanced age (>5men, >66 women);dyslipidemia;hypertension;male gender;smoking/ tobacco exposure     Objective:    Today's Vitals   06/18/24 0845  BP: 128/81  Pulse: 90  Weight: 184 lb (83.5 kg)  Height: 5' 10 (1.778 m)   Body mass index is 26.4 kg/m.     06/18/2024    8:50 AM 06/16/2023   11:07 AM 06/01/2022    2:31 PM 05/31/2021    4:40 PM 01/11/2021   12:57 PM 04/10/2020    7:14 AM 04/08/2020   11:45 AM  Advanced Directives  Does Patient Have a Medical Advance Directive? No Yes No No No Yes No  Type of Special educational needs teacher of [place];Living will       Copy of Healthcare Power of Attorney in Chart?  No - copy requested       Would  patient like information on creating a medical advance directive?   No - Patient declined No - Patient declined No - Patient declined  No - Patient declined    Current Medications (verified) Outpatient Encounter Medications as of 06/18/2024  Medication Sig   buPROPion  (WELLBUTRIN  XL) 150 MG 24 hr tablet Take 1 tablet (150 mg total) by mouth daily.   famotidine  (PEPCID ) 20 MG tablet Take 1 tablet (20 mg total) by mouth 2 (two) times daily.   fluticasone  (FLONASE ) 50 MCG/ACT nasal spray Place 1 spray into both nostrils 2 (two) times daily as needed for allergies or rhinitis.   gabapentin  (NEURONTIN ) 300 MG capsule Take 1 capsule (300 mg total) by mouth daily.   hydrochlorothiazide  (HYDRODIURIL ) 25 MG tablet TAKE ONE TABLET BY MOUTH ONCE DAILY   meloxicam  (MOBIC ) 15 MG tablet Take 1 tablet (15 mg total) by mouth daily.   metoprolol  tartrate (LOPRESSOR ) 25 MG tablet Take 1 tablet (25 mg total) by mouth 2 (two) times daily.   Multiple Vitamins-Minerals (MULTIVITAMIN PO) Take 1 tablet by mouth daily.   nicotine  (NICODERM CQ  - DOSED IN MG/24 HR) 7 mg/24hr patch Place 2 patches (14 mg total) onto the skin daily.   nitroGLYCERIN  (NITROSTAT ) 0.4 MG SL tablet Place 1 tablet (0.4 mg total) under the tongue every 5 (five) minutes as needed for chest pain.   NONFORMULARY OR COMPOUNDED ITEM Take 1 Dose by mouth daily. Memory Vitamin   NONFORMULARY OR COMPOUNDED ITEM Take 1 Dose by  mouth daily. Beets   Omega-3 Fatty Acids (FISH OIL) 1000 MG CAPS Take 1,000 mg by mouth daily.   potassium chloride  SA (KLOR-CON  M) 20 MEQ tablet TAKE ONE TABLET BY MOUTH ONCE DAILY   promethazine -dextromethorphan (PROMETHAZINE -DM) 6.25-15 MG/5ML syrup Take 5 mLs by mouth 4 (four) times daily as needed for cough.   rosuvastatin  (CRESTOR ) 20 MG tablet TAKE 1 TABLET BY MOUTH EVERY DAY   sildenafil  (VIAGRA ) 100 MG tablet TAKE 1/2 TO 1 TABLET BY MOUTH DAILY AS NEEDED FOR ERECTILE DYSFUNCTION   varenicline  (CHANTIX ) 1 MG tablet Take 1  tablet (1 mg total) by mouth 2 (two) times daily. Start with 0.5 Tab BID for 1 week prior to increasing to 1mg  BID   No facility-administered encounter medications on file as of 06/18/2024.    Allergies (verified) Patient has no known allergies.   History: Past Medical History:  Diagnosis Date   Abnormal EKG    Apical variant hypertrophic cardiomyopathy (HCC)    Benign essential hypertension    BPH (benign prostatic hyperplasia)    Hypokalemia    Tobacco abuse    Past Surgical History:  Procedure Laterality Date   CARDIAC CATHETERIZATION     2008   COLONOSCOPY N/A 02/26/2015   Procedure: COLONOSCOPY;  Surgeon: Claudis RAYMOND Rivet, MD;  [facility];  Service: Endoscopy;  Laterality: N/A;  830   COLONOSCOPY WITH PROPOFOL  N/A 04/10/2020   Procedure: COLONOSCOPY WITH PROPOFOL ;  Surgeon: Rivet Claudis RAYMOND, MD;  [facility];  Service: Endoscopy;  Laterality: N/A;  830   INGUINAL HERNIA REPAIR Right 01/13/2021   Procedure: HERNIA REPAIR INGUINAL ADULT;  Surgeon: Mavis Anes, MD;  Location: [facility];  Service: General;  Laterality: Right;   KIDNEY SURGERY Right April 2015   benign tumor removal   POLYPECTOMY  04/10/2020   Procedure: POLYPECTOMY;  Surgeon: Rivet Claudis RAYMOND, MD;  Location: [facility];  Service: Endoscopy;;  proximal transverse colon(cs); transverse colon polyp (cbiopsy);sigmoid colon polyp(cb);   PROSTATE ABLATION  12-2015 at [facility]   SHOULDER ACROMIOPLASTY Left 01/18/2016   Procedure: SHOULDER ACROMIOPLASTY;  Surgeon: Alm Hummer, MD;  Location: [facility];  Service: Orthopedics;  Laterality: Left;   SHOULDER ARTHROSCOPY WITH ROTATOR CUFF REPAIR AND SUBACROMIAL DECOMPRESSION Left 01/18/2016   Procedure: LEFT SHOULDER ARTHROSCOPY WITH ROTATOR CUFF REPAIR AND SUBACROMIAL DECOMPRESSION;  Surgeon: Alm Hummer, MD;  Location: [facility];  Service: Orthopedics;  Laterality: Left;  PRE-OP BLOCK WITH GENERAL ANESTHESIA   Family  History  Problem Relation Age of Onset   Diabetes Mother    Diabetes Other    Hypertension Other    Social History   Socioeconomic History   Marital status: Divorced    Spouse name: Not on file   Number of children: 1   Years of education: Not on file   Highest education level: Not on file  Occupational History   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 08/03/2022 Tay  Vaping Use   Vaping status: Never Used  Substance and Sexual Activity   Alcohol use: No   Drug use: No   Sexual activity: Not on file  Other Topics Concern   Not on file  Social History Narrative   Divorced   05/31/2021 - has a 34 year old son who lives with his mother, but he visits him every evening.   Social Drivers of Corporate investment banker Strain: Low Risk  (06/18/2024)   Overall Financial Resource Strain (CARDIA)    Difficulty of Paying Living Expenses: Not hard at all  Food Insecurity: No Food Insecurity (06/18/2024)   Hunger Vital Sign    Worried About Running Out of Food in the Last Year: Never true    Ran Out of Food in the Last Year: Never true  Transportation Needs: No Transportation Needs (06/18/2024)   PRAPARE - Administrator, Civil Service (Medical): No    Lack of Transportation (Non-Medical): No  Physical Activity: Inactive (06/18/2024)   Exercise Vital Sign    Days of Exercise per Week: 0 days    Minutes of Exercise per Session: 0 min  Stress: No Stress Concern Present (06/18/2024)   [employer] of Occupational Health - Occupational Stress Questionnaire    Feeling of Stress: Not at all  Social Connections: Moderately Isolated (06/18/2024)   Social Connection and Isolation Panel    Frequency  of Communication with Friends and Family: More than three times a week    Frequency of Social Gatherings with Friends and Family: More than three times a week    Attends Religious Services: More than 4 times per year    Active Member of [employer] or Organizations: No    Attends Banker Meetings: Never    Marital Status: Divorced    Tobacco Counseling Ready to quit: No Counseling given: Yes Tobacco comments: Smokes 3 cigs a day. 08/03/2022 Tay    Clinical Intake:  Pre-visit preparation completed: Yes  Pain : No/denies pain     BMI - recorded: 26.4 Nutritional Status: BMI 25 -29 Overweight Nutritional Risks: None Diabetes: No  Lab Results  Component Value Date   HGBA1C 6.1 (H) 12/27/2023   HGBA1C 5.5 06/26/2023   HGBA1C 6.1 (H) 11/23/2022     How often do you need to have someone help you when you read instructions, pamphlets, or other written materials from your doctor or pharmacy?: 1 - Never  Interpreter Needed?: No  Information entered by :: alia t/cma   Activities of Daily Living     06/18/2024    8:47 AM  In your present state of health, do you have any difficulty performing the following activities:  Hearing? 0  Vision? 0  Difficulty concentrating or making decisions? 0  Walking or climbing stairs? 0  Dressing or bathing? 0  Doing errands, shopping? 0  Preparing Food and eating ? N  Using the Toilet? N  In the past six months, have you accidently leaked urine? N  Do you have problems with loss of bowel control? N  Managing your Medications? N  Managing your Finances? N  Housekeeping or managing your Housekeeping? N    Patient Care Team: Dettinger, Fonda LABOR, MD as PCP - General (Family Medicine)  I have updated your Care Teams any recent Medical Services you may have received from other providers in the past year.     Assessment:   This is a routine wellness examination for [place].  Hearing/Vision screen Hearing Screening - Comments::  Pt denies hearing dif Vision Screening - Comments:: Pt denies vision/last ov 34yrs ago   Goals Addressed  This Visit's Progress    Quit Smoking   On track      Depression Screen     06/18/2024    9:00 AM 12/27/2023   10:00 AM 06/26/2023   10:58 AM 06/16/2023   11:06 AM 11/23/2022    3:57 PM 06/15/2022   10:51 AM 06/01/2022    2:30 PM  PHQ 2/9 Scores  PHQ - 2 Score 0 0 0 0 0 0 0  PHQ- 9 Score   0  0 0 0    Fall Risk     06/18/2024    8:46 AM 12/27/2023   10:00 AM 06/26/2023   10:58 AM 06/16/2023   11:05 AM 11/23/2022    3:56 PM  Fall Risk   Falls in the past year? 0 0 0 0 0  Number falls in past yr: 0   0   Injury with Fall? 0   0   Risk for fall due to : No Fall Risks   No Fall Risks   Follow up Falls evaluation completed   Falls prevention discussed     MEDICARE RISK AT HOME:  Medicare Risk at Home Any stairs in or around the home?: No If so, are there any without handrails?: No Home free of loose throw rugs in walkways, pet beds, electrical cords, etc?: Yes Adequate lighting in your home to reduce risk of falls?: Yes Life alert?: No Use of a cane, walker or w/c?: No Grab bars in the bathroom?: Yes Shower chair or bench in shower?: No Elevated toilet seat or a handicapped toilet?: No  TIMED UP AND GO:  Was the test performed?  no  Cognitive Function: 6CIT completed        06/18/2024    9:00 AM 06/16/2023   11:08 AM 06/01/2022    2:32 PM 05/31/2021    4:40 PM  6CIT Screen  What Year? 0 points 0 points 0 points 0 points  What month? 3 points 0 points 0 points 0 points  What time? 0 points 0 points 0 points 0 points  Count back from 20 0 points 0 points 0 points 0 points  Months in reverse 2 points 0 points 0 points 0 points  Repeat phrase 4 points 0 points 0 points 0 points  Total Score 9 points 0 points 0 points 0 points    Immunizations Immunization History  Administered Date(s) Administered   Fluad Quad(high Dose 65+) 07/21/2023   Influenza  Inj Mdck Quad Pf 10/04/2017, 08/15/2018, 08/09/2021   Influenza Inj Mdck Quad With Preservative 07/26/2019   Influenza Split 07/18/2022   Influenza,inj,Quad PF,6+ Mos 08/11/2015   Influenza-Unspecified 10/04/2017, 10/04/2017, 08/15/2018, 07/26/2019, 08/22/2020, 08/09/2021   Moderna Covid-19 Fall Seasonal Vaccine 65yrs & older 07/21/2023   Moderna Covid-19 Vaccine Bivalent Booster 20yrs & up 08/02/2021   Moderna Sars-Covid-2 Vaccination 01/24/2020, 02/25/2020, 09/23/2020   Tdap 06/04/2013, 06/26/2023   Zoster Recombinant(Shingrix ) 12/16/2021, 06/15/2022    Screening Tests Health Maintenance  Topic Date Due   COVID-19 Vaccine (6 - 2024-25 season) 01/18/2024   INFLUENZA VACCINE  05/31/2024   Pneumococcal Vaccine: 50+ Years (1 of 2 - PCV) 06/25/2024 (Originally 05/02/1975)   Colonoscopy  04/10/2025   Lung Cancer Screening  04/19/2025   Medicare Annual Wellness (AWV)  06/18/2025   DTaP/Tdap/Td (3 - Td or Tdap) 06/25/2033   Hepatitis C Screening  Completed   Zoster Vaccines- Shingrix   Completed   HPV VACCINES  Aged Out   Meningococcal B Vaccine  Aged Out  Health Maintenance  Health Maintenance Due  Topic Date Due   COVID-19 Vaccine (6 - 2024-25 season) 01/18/2024   INFLUENZA VACCINE  05/31/2024   Health Maintenance Items Addressed: See Nurse Notes at the end of this note  Additional Screening:  Vision Screening: Recommended annual ophthalmology exams for early detection of glaucoma and other disorders of the eye. Would you like a referral to an eye doctor? No    Dental Screening: Recommended annual dental exams for proper oral hygiene  Community Resource Referral / Chronic Care Management: CRR required this visit?  Yes   CCM required this visit?  No   Plan:    I have personally reviewed and noted the following in the patient's chart:   Medical and social history Use of alcohol, tobacco or illicit drugs  Current medications and supplements including opioid  prescriptions. Patient is not currently taking opioid prescriptions. Functional ability and status Nutritional status Physical activity Advanced directives List of other physicians Hospitalizations, surgeries, and ER visits in previous 12 months Vitals Screenings to include cognitive, depression, and falls Referrals and appointments  In addition, I have reviewed and discussed with patient certain preventive protocols, quality metrics, and best practice recommendations. A written personalized care plan for preventive services as well as general preventive health recommendations were provided to patient.   Ozie Ned, CMA   06/18/2024   After Visit Summary: (MyChart) Due to this being a telephonic visit, the after visit summary with patients personalized plan was offered to patient via MyChart   Notes: Nothing significant to report at this time.

## 2024-06-26 ENCOUNTER — Encounter: Payer: Self-pay | Admitting: Family Medicine

## 2024-06-26 ENCOUNTER — Ambulatory Visit (INDEPENDENT_AMBULATORY_CARE_PROVIDER_SITE_OTHER): Payer: Medicare Other | Admitting: Family Medicine

## 2024-06-26 VITALS — BP 122/86 | HR 103 | Temp 97.3°F | Ht 70.0 in | Wt 186.6 lb

## 2024-06-26 DIAGNOSIS — R7303 Prediabetes: Secondary | ICD-10-CM

## 2024-06-26 DIAGNOSIS — F17210 Nicotine dependence, cigarettes, uncomplicated: Secondary | ICD-10-CM | POA: Diagnosis not present

## 2024-06-26 DIAGNOSIS — R0981 Nasal congestion: Secondary | ICD-10-CM

## 2024-06-26 DIAGNOSIS — J439 Emphysema, unspecified: Secondary | ICD-10-CM | POA: Diagnosis not present

## 2024-06-26 DIAGNOSIS — I10 Essential (primary) hypertension: Secondary | ICD-10-CM

## 2024-06-26 DIAGNOSIS — E782 Mixed hyperlipidemia: Secondary | ICD-10-CM | POA: Diagnosis not present

## 2024-06-26 DIAGNOSIS — R059 Cough, unspecified: Secondary | ICD-10-CM

## 2024-06-26 LAB — LIPID PANEL

## 2024-06-26 MED ORDER — PROMETHAZINE-DM 6.25-15 MG/5ML PO SYRP: 5.0000 mL | ORAL_SOLUTION | Freq: Four times a day (QID) | ORAL | 1 refills | Status: DC | PRN

## 2024-06-26 MED ORDER — ROSUVASTATIN CALCIUM 20 MG PO TABS
20.0000 mg | ORAL_TABLET | Freq: Every day | ORAL | 3 refills | Status: AC
Start: 2024-06-26 — End: ?

## 2024-06-26 MED ORDER — FAMOTIDINE 20 MG PO TABS: 20.0000 mg | ORAL_TABLET | Freq: Two times a day (BID) | ORAL | 3 refills | Status: DC

## 2024-06-26 MED ORDER — HYDROCHLOROTHIAZIDE 25 MG PO TABS: 25.0000 mg | ORAL_TABLET | Freq: Every day | ORAL | 3 refills | Status: AC

## 2024-06-26 MED ORDER — OLOPATADINE HCL 0.1 % OP SOLN: 1.0000 [drp] | Freq: Two times a day (BID) | OPHTHALMIC | 12 refills | Status: AC | PRN

## 2024-06-26 MED ORDER — BUPROPION HCL ER (XL) 150 MG PO TB24: 150.0000 mg | ORAL_TABLET | Freq: Every day | ORAL | 3 refills | Status: AC

## 2024-06-26 NOTE — Progress Notes (Signed)
 BP 122/86   Pulse (!) 103   Temp (!) 97.3 F (36.3 C)   Ht 5' 10 (1.778 m)   Wt 186 lb 9.6 oz (84.6 kg)   SpO2 96%   BMI 26.77 kg/m    Subjective:   Patient ID: [name] Foster., male    DOB: 01/22/1956, 68 y.o.   MRN: [id number]  HPI: [name] Foster. is a 68 y.o. male presenting on 06/26/2024 for Medical Management of Chronic Issues   Discussed the use of AI scribe software for clinical note transcription with the patient, who gave verbal consent to proceed.  History of Present Illness   [name] Foster. is a 68 year old male with emphysema and COPD who presents for follow-up after a full body scan.  He is following up after a full body scan that revealed emphysema and COPD, with a few scattered small nodules in the lungs, none of which appeared new. He is concerned about early signs of any issues and inquires about over-the-counter supplements for his liver.  The scan identified a simple cyst in the top of the left liver. He was initially concerned it might be related to his lifestyle, but he does not consume alcohol or drugs, only sodas. He has a history of a tumor removal from the right kidney, and scarring from that surgery was noted, but the right kidney currently shows no stones or extra fluid.  He has a history of an enlarged prostate and underwent surgery in [place] to address this. He mentions previous issues with urination that have since resolved.  He is currently taking Wellbutrin  to aid in smoking cessation and reports smoking less than before. He prefers to maintain his current dose and is not using nicotine  patches.  His current medications include hydrochlorothiazide  and metoprolol  for blood pressure, which is well-controlled at 122/86, and Crestor  for cholesterol along with fish oils. He experiences muscle spasms in the summer, which he attributes to dehydration and plans to increase water  intake.  He mentions a recent occurrence of crust in his left ear, which he  attributes to allergies. He has COPD and uses a prescribed cough syrup for sharp coughs during colds. He also uses allergy eye drops, especially during seasonal changes, and requests a refill for these medications.          Relevant past medical, surgical, family and social history reviewed and updated as indicated. Interim medical history since our last visit reviewed. Allergies and medications reviewed and updated.  Review of Systems  Constitutional:  Negative for chills and fever.  Eyes:  Negative for visual disturbance.  Respiratory:  Negative for shortness of breath and wheezing.   Cardiovascular:  Negative for chest pain and leg swelling.  Musculoskeletal:  Negative for back pain and gait problem.  Skin:  Negative for rash.  Neurological:  Negative for dizziness and light-headedness.  All other systems reviewed and are negative.   Per HPI unless specifically indicated above   Allergies as of 06/26/2024   No Known Allergies      Medication List        Accurate as of June 26, 2024 10:52 AM. If you have any questions, ask your nurse or doctor.          buPROPion  150 MG 24 hr tablet Commonly known as: Wellbutrin  XL Take 1 tablet (150 mg total) by mouth daily.   famotidine  20 MG tablet Commonly known as: PEPCID  Take 1 tablet (20 mg total) by mouth 2 (  two) times daily.   Fish Oil 1000 MG Caps Take 1,000 mg by mouth daily.   fluticasone  50 MCG/ACT nasal spray Commonly known as: FLONASE  Place 1 spray into both nostrils 2 (two) times daily as needed for allergies or rhinitis.   gabapentin  300 MG capsule Commonly known as: NEURONTIN  Take 1 capsule (300 mg total) by mouth daily.   hydrochlorothiazide  25 MG tablet Commonly known as: HYDRODIURIL  Take 1 tablet (25 mg total) by mouth daily.   meloxicam  15 MG tablet Commonly known as: MOBIC  Take 1 tablet (15 mg total) by mouth daily.   metoprolol  tartrate 25 MG tablet Commonly known as: LOPRESSOR  Take 1  tablet (25 mg total) by mouth 2 (two) times daily.   MULTIVITAMIN PO Take 1 tablet by mouth daily.   nicotine  7 mg/24hr patch Commonly known as: NICODERM CQ  - dosed in mg/24 hr Place 2 patches (14 mg total) onto the skin daily.   nitroGLYCERIN  0.4 MG SL tablet Commonly known as: NITROSTAT  Place 1 tablet (0.4 mg total) under the tongue every 5 (five) minutes as needed for chest pain.   NONFORMULARY OR COMPOUNDED ITEM Take 1 Dose by mouth daily. Memory Vitamin   NONFORMULARY OR COMPOUNDED ITEM Take 1 Dose by mouth daily. Beets   olopatadine  0.1 % ophthalmic solution Commonly known as: PATANOL Place 1 drop into both eyes 2 (two) times daily as needed for allergies. Started by: Fonda LABOR Derrell Milanes   potassium chloride  SA 20 MEQ tablet Commonly known as: KLOR-CON  M TAKE ONE TABLET BY MOUTH ONCE DAILY   promethazine -dextromethorphan 6.25-15 MG/5ML syrup Commonly known as: PROMETHAZINE -DM Take 5 mLs by mouth 4 (four) times daily as needed for cough.   rosuvastatin  20 MG tablet Commonly known as: CRESTOR  Take 1 tablet (20 mg total) by mouth daily.   sildenafil  100 MG tablet Commonly known as: VIAGRA  TAKE 1/2 TO 1 TABLET BY MOUTH DAILY AS NEEDED FOR ERECTILE DYSFUNCTION   varenicline  1 MG tablet Commonly known as: CHANTIX  Take 1 tablet (1 mg total) by mouth 2 (two) times daily. Start with 0.5 Tab BID for 1 week prior to increasing to 1mg  BID         Objective:   BP 122/86   Pulse (!) 103   Temp (!) 97.3 F (36.3 C)   Ht 5' 10 (1.778 m)   Wt 186 lb 9.6 oz (84.6 kg)   SpO2 96%   BMI 26.77 kg/m   Wt Readings from Last 3 Encounters:  06/26/24 186 lb 9.6 oz (84.6 kg)  06/18/24 184 lb (83.5 kg)  12/27/23 184 lb (83.5 kg)    Physical Exam Physical Exam   VITALS: BP- 122/86 HEENT: Left ear normal. CHEST: Lungs clear to auscultation. CARDIOVASCULAR: Regular heart sounds, no murmurs. ABDOMEN: No costovertebral angle tenderness. EXTREMITIES: No swelling in  extremities, pulse intact.         Assessment & Plan:   Problem List Items Addressed This Visit       Cardiovascular and Mediastinum   Essential hypertension   Relevant Medications   hydrochlorothiazide  (HYDRODIURIL ) 25 MG tablet   rosuvastatin  (CRESTOR ) 20 MG tablet   Other Relevant Orders   CBC with Differential/Platelet   CMP14+EGFR     Other   Hyperlipidemia   Relevant Medications   hydrochlorothiazide  (HYDRODIURIL ) 25 MG tablet   rosuvastatin  (CRESTOR ) 20 MG tablet   Other Relevant Orders   Lipid panel   Prediabetes - Primary   Relevant Orders   Bayer DCA Hb A1c Waived  Other Visit Diagnoses       Smokes with greater than 30 pack year history       Relevant Medications   buPROPion  (WELLBUTRIN  XL) 150 MG 24 hr tablet     Pulmonary emphysema, unspecified emphysema type (HCC)       Relevant Medications   buPROPion  (WELLBUTRIN  XL) 150 MG 24 hr tablet   promethazine -dextromethorphan (PROMETHAZINE -DM) 6.25-15 MG/5ML syrup     Sinus congestion       Relevant Medications   promethazine -dextromethorphan (PROMETHAZINE -DM) 6.25-15 MG/5ML syrup          Chronic obstructive pulmonary disease (COPD) with emphysema COPD with emphysema confirmed on CT scan. Experiences sharp cough during colds. - Prescribe promethazine  syrup for cough management.  Nicotine  dependence, current smoker Current smoker using Wellbutrin  for smoking cessation. Reports reduced smoking but continues due to stress. Declined increase in Wellbutrin  dosage and prefers not to use nicotine  patches. - Continue current dose of Wellbutrin  for smoking cessation.  Hypertension Blood pressure well-controlled at 122/86 mmHg on hydrochlorothiazide  and metoprolol . - Continue hydrochlorothiazide  and metoprolol  for blood pressure management.  Hyperlipidemia On Crestor  and fish oils for cholesterol management. Blood work planned to assess cholesterol levels. - Continue Crestor  and fish oils for cholesterol  management. - Check cholesterol levels with blood work.  Benign prostatic hyperplasia with lower urinary tract symptoms Asymptomatic with no urinary retention issues post-prostate surgery. - Monitor for any recurrence of urinary symptoms.  History of right kidney tumor, status post partial nephrectomy CT scan shows scarring from previous surgery. Right kidney appears normal with no stones or fluid accumulation.  Simple hepatic cyst, left lobe CT scan shows a simple cyst in the left lobe of the liver, common and non-interventional. - Monitor with routine imaging as needed.  Allergic rhinitis Experiences seasonal allergies, particularly in the fall. - Prescribe allergy eye drops for seasonal use.  Dry eyes (keratoconjunctivitis sicca) Uses moisture eye drops for dry eyes, especially during allergy season. - Continue use of moisture eye drops as needed.  Muscle spasms, likely dehydration-related Muscle spasms likely due to dehydration, especially in summer months. - Encourage increased water  intake to prevent dehydration.          Follow up plan: Return in about 6 months (around 12/27/2024), or if symptoms worsen or fail to improve, for Physical exam and hypertension and hyperlipidemia and COPD.  Counseling provided for all of the vaccine components Orders Placed This Encounter  Procedures   CBC with Differential/Platelet   CMP14+EGFR   Lipid panel   Bayer DCA Hb A1c Waived    Fonda Levins, MD [facility] 06/26/2024, 10:52 AM

## 2024-06-27 LAB — SPECIMEN STATUS REPORT

## 2024-07-02 LAB — LIPID PANEL
Chol/HDL Ratio: 2.8 ratio (ref 0.0–5.0)
Cholesterol, Total: 89 mg/dL — ABNORMAL LOW (ref 100–199)
HDL: 32 mg/dL — ABNORMAL LOW (ref >39)
LDL Chol Calc (NIH): 44 mg/dL (ref 0–99)
Triglycerides: 55 mg/dL (ref 0–149)
VLDL Cholesterol Cal: 13 mg/dL (ref 5–40)

## 2024-07-02 LAB — CMP14+EGFR
ALT: 32 IU/L (ref 0–44)
AST: 23 IU/L (ref 0–40)
Albumin: 4.6 g/dL (ref 3.9–4.9)
Alkaline Phosphatase: 120 IU/L (ref 44–121)
BUN/Creatinine Ratio: 12 (ref 10–24)
BUN: 13 mg/dL (ref 8–27)
Bilirubin Total: 0.6 mg/dL (ref 0.0–1.2)
CO2: 18 mmol/L — ABNORMAL LOW (ref 20–29)
Calcium: 9.3 mg/dL (ref 8.6–10.2)
Chloride: 103 mmol/L (ref 96–106)
Creatinine, Ser: 1.06 mg/dL (ref 0.76–1.27)
Globulin, Total: 2.5 g/dL (ref 1.5–4.5)
Glucose: 106 mg/dL — ABNORMAL HIGH (ref 70–99)
Potassium: 4 mmol/L (ref 3.5–5.2)
Sodium: 136 mmol/L (ref 134–144)
Total Protein: 7.1 g/dL (ref 6.0–8.5)
eGFR: 77 mL/min/1.73 (ref >59)

## 2024-07-02 LAB — CBC WITH DIFFERENTIAL/PLATELET
Basophils Absolute: 0 x10E3/uL (ref 0.0–0.2)
Basos: 1 %
EOS (ABSOLUTE): 0.2 x10E3/uL (ref 0.0–0.4)
Eos: 4 %
Hematocrit: 47.3 % (ref 37.5–51.0)
Hemoglobin: 15.2 g/dL (ref 13.0–17.7)
Immature Grans (Abs): 0 x10E3/uL (ref 0.0–0.1)
Immature Granulocytes: 0 %
Lymphocytes Absolute: 1.7 x10E3/uL (ref 0.7–3.1)
Lymphs: 27 %
MCH: 25.8 pg — ABNORMAL LOW (ref 26.6–33.0)
MCHC: 32.1 g/dL (ref 31.5–35.7)
MCV: 80 fL (ref 79–97)
Monocytes Absolute: 0.8 x10E3/uL (ref 0.1–0.9)
Monocytes: 13 %
Neutrophils Absolute: 3.6 x10E3/uL (ref 1.4–7.0)
Neutrophils: 55 %
Platelets: 213 x10E3/uL (ref 150–450)
RBC: 5.9 x10E6/uL — ABNORMAL HIGH (ref 4.14–5.80)
RDW: 13.6 % (ref 11.6–15.4)
WBC: 6.5 x10E3/uL (ref 3.4–10.8)

## 2024-07-02 LAB — HGB A1C W/O EAG: Hgb A1c MFr Bld: 6.4 % — ABNORMAL HIGH (ref 4.8–5.6)

## 2024-07-02 LAB — SPECIMEN STATUS REPORT

## 2024-07-10 ENCOUNTER — Ambulatory Visit: Payer: Self-pay | Admitting: Family Medicine

## 2024-07-18 ENCOUNTER — Telehealth: Payer: Self-pay

## 2024-07-18 ENCOUNTER — Ambulatory Visit (INDEPENDENT_AMBULATORY_CARE_PROVIDER_SITE_OTHER): Admitting: Family

## 2024-07-18 ENCOUNTER — Encounter: Payer: Self-pay | Admitting: Family

## 2024-07-18 ENCOUNTER — Ambulatory Visit: Payer: Self-pay

## 2024-07-18 VITALS — BP 129/79 | HR 82 | Temp 98.4°F | Ht 70.0 in | Wt 186.2 lb

## 2024-07-18 DIAGNOSIS — J019 Acute sinusitis, unspecified: Secondary | ICD-10-CM

## 2024-07-18 MED ORDER — AMOXICILLIN-POT CLAVULANATE 875-125 MG PO TABS: 1.0000 | ORAL_TABLET | Freq: Two times a day (BID) | ORAL | 0 refills | Status: DC

## 2024-07-18 NOTE — Telephone Encounter (Signed)
 RX sent to [facility].

## 2024-07-18 NOTE — Patient Instructions (Signed)

## 2024-07-18 NOTE — Telephone Encounter (Signed)
 Patient aware and verbalized understanding.

## 2024-07-18 NOTE — Addendum Note (Signed)
 Addended by: LAVELL LYE A on: 07/18/2024 04:06 PM   Modules accepted: Orders

## 2024-07-18 NOTE — Telephone Encounter (Signed)
 FYI Only or Action Required?: FYI only for provider.  Patient was last seen in primary care on 06/26/2024 by Dettinger, Fonda LABOR, MD.  Called Nurse Triage reporting Facial Pain.  Symptoms began several days ago.  Interventions attempted: Nothing.  Symptoms are: gradually worsening.  Triage Disposition: See HCP Within 4 Hours (Or PCP Triage)  Patient/caregiver understands and will follow disposition?: Yes       Copied from CRM [id number]. Topic: Clinical - Red Word Triage >> Jul 18, 2024  9:59 AM DeAngela L wrote: Red Word that prompted transfer to Nurse Triage: patient has a question for Dr Dettinger nurse he has a sinus infection and has the been taking little red tablets, and they are not helping and he has swelling and pain around the temple area and sinus, and when he wake up morning his eyes are red, severe headache pulsating, migraine medication worked a little but not completely  Patient asking if he can be prescribed an antibiotic   [id number] - [place], [place] - [address] [facility] [address] [place] [address] Phone: [phone] Fax: [phone]  Pt num [phone] (M) Reason for Disposition  [1] SEVERE sinus pain (e.g., excruciating) AND [2] not improved 2 hours after pain medicine  Answer Assessment - Initial Assessment Questions 1. LOCATION: Where does it hurt?      Under cheek bones, behind eyes, swelling,forehead, eyes red, temple pain 2. ONSET: When did the sinus pain start?  (e.g., hours, days)      2 days 3. SEVERITY: How bad is the pain?   (Scale 0-10; or none, mild, moderate or severe)     severe 4. RECURRENT SYMPTOM: Have you ever had sinus problems before? If Yes, ask: When was the last time? and What happened that time?      yes 5. NASAL CONGESTION: Is the nose blocked? If Yes, ask: Can you open it or must you breathe through your mouth?     Runny at times 6. NASAL DISCHARGE: Do you have  discharge from your nose? If so ask, What color?     yellow 7. FEVER: Do you have a fever? If Yes, ask: What is it, how was it measured, and when did it start?      denies 8. OTHER SYMPTOMS: Do you have any other symptoms? (e.g., sore throat, cough, earache, difficulty breathing)     Difficulty breathing when laying down, cough 9. PREGNANCY: Is there any chance you are pregnant? When was your last menstrual period?     na  Protocols used: Sinus Pain or Congestion-A-AH

## 2024-07-18 NOTE — Telephone Encounter (Signed)
Appt made with dod 

## 2024-07-18 NOTE — Progress Notes (Signed)
 Subjective:    Patient ID: [name] Foster., male    DOB: Aug 28, 1956, 68 y.o.   MRN: [id number]  Chief Complaint  Patient presents with   Sinusitis    HEAD PAIN, PRESSURE, AND CONGESTION   PT presents to the office today with sinus congestion for and sinus pressure that is worsening. Reports having a rhinorrhea for weeks.  Reports using decongestant, flonase ,  and tylenol  with mild relief.  Sinusitis This is a new problem. The current episode started in the past 7 days. The problem has been gradually worsening since onset. There has been no fever. His pain is at a severity of 8/10. The pain is moderate. Associated symptoms include congestion, coughing, headaches, sinus pressure and sneezing. Pertinent negatives include no ear pain, shortness of breath or sore throat. Past treatments include nasal decongestants, saline nose sprays and oral decongestants.      Review of Systems  HENT:  Positive for congestion, sinus pressure and sneezing. Negative for ear pain and sore throat.   Respiratory:  Positive for cough. Negative for shortness of breath.   Neurological:  Positive for headaches.  All other systems reviewed and are negative.   Social History   Socioeconomic History   Marital status: Divorced    Spouse name: Not on file   Number of children: 1   Years of education: Not on file   Highest education level: Not on file  Occupational History   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 08/03/2022 Tay  Vaping Use   Vaping status: Never Used  Substance and Sexual Activity   Alcohol use: No   Drug use: No   Sexual activity: Not on file  Other Topics Concern   Not on file  Social History Narrative    Divorced   05/31/2021 - has a 4 year old son who lives with his mother, but he visits him every evening.   Social Drivers of Corporate investment banker Strain: Low Risk  (06/18/2024)   Overall Financial Resource Strain (CARDIA)    Difficulty of Paying Living Expenses: Not hard at all  Food Insecurity: No Food Insecurity (06/18/2024)   Hunger Vital Sign    Worried About Running Out of Food in the Last Year: Never true    Ran Out of Food in the Last Year: Never true  Transportation Needs: No Transportation Needs (06/18/2024)   PRAPARE - Administrator, Civil Service (Medical): No    Lack of Transportation (Non-Medical): No  Physical Activity: Inactive (06/18/2024)   Exercise Vital Sign    Days of Exercise per Week: 0 days    Minutes of Exercise per Session: 0 min  Stress: No Stress Concern Present (06/18/2024)   [employer] of Occupational Health - Occupational Stress Questionnaire    Feeling of Stress: Not at all  Social Connections: Moderately Isolated (06/18/2024)   Social Connection and Isolation Panel    Frequency of Communication with Friends and Family: More than three times a week    Frequency of Social Gatherings with Friends and Family: More than three times a week    Attends Religious Services: More than 4  times per year    Active Member of Clubs or Organizations: No    Attends Banker Meetings: Never    Marital Status: Divorced   Family History  Problem Relation Age of Onset   Diabetes Mother    Diabetes Other    Hypertension Other         Objective:   Physical Exam Vitals reviewed.  Constitutional:      General: He is not in acute distress.    Appearance: He is well-developed.  HENT:     Head: Normocephalic.     Right Ear: Tympanic membrane is erythematous.     Left Ear: Tympanic membrane normal.     Nose:     Right Sinus: Maxillary sinus tenderness present.     Left Sinus: Maxillary sinus tenderness present.  Eyes:      General:        Right eye: No discharge.        Left eye: No discharge.     Pupils: Pupils are equal, round, and reactive to light.  Neck:     Thyroid: No thyromegaly.  Cardiovascular:     Rate and Rhythm: Normal rate and regular rhythm.     Heart sounds: Normal heart sounds. No murmur heard. Pulmonary:     Effort: Pulmonary effort is normal. No respiratory distress.     Breath sounds: Normal breath sounds. No wheezing.  Abdominal:     General: Bowel sounds are normal. There is no distension.     Palpations: Abdomen is soft.     Tenderness: There is no abdominal tenderness.  Musculoskeletal:        General: No tenderness. Normal range of motion.     Cervical back: Normal range of motion and neck supple.  Skin:    General: Skin is warm and dry.     Findings: No erythema or rash.  Neurological:     Mental Status: He is alert and oriented to person, place, and time.     Cranial Nerves: No cranial nerve deficit.     Deep Tendon Reflexes: Reflexes are normal and symmetric.  Psychiatric:        Behavior: Behavior normal.        Thought Content: Thought content normal.        Judgment: Judgment normal.       BP 129/79   Pulse 82   Temp 98.4 F (36.9 C)   Ht 5' 10 (1.778 m)   Wt 186 lb 3.2 oz (84.5 kg)   SpO2 96%   BMI 26.72 kg/m      Assessment & Plan:  Jabri Blancett. comes in today with chief complaint of Sinusitis (HEAD PAIN, PRESSURE, AND CONGESTION)   Diagnosis and orders addressed:  1. Acute sinusitis, recurrence not specified, unspecified location (Primary) - Take meds as prescribed - Use a cool mist humidifier  -Use saline nose sprays frequently -Force fluids -For any cough or congestion  Use plain Mucinex- regular strength or max strength is fine -For fever or aces or pains- take tylenol  or ibuprofen . -Throat lozenges if help -Follow up if symptoms worsen or do not improve  - amoxicillin -clavulanate (AUGMENTIN ) 875-125 MG tablet; Take 1 tablet by mouth  2 (two) times daily.  Dispense: 14 tablet; Refill: 0    Bari Learn, FNP

## 2024-07-18 NOTE — Telephone Encounter (Signed)
 Copied from CRM [id number]. Topic: Clinical - Prescription Issue >> Jul 18, 2024  3:16 PM Emylou G wrote: Reason for CRM: Patient called was just seen.. said waiting for the New Amoxicillin -Pot Clavulanate 875-125 MG .SABRA He said he is at the [facility].. I did adv of turn around time.  Pls call him

## 2024-07-25 ENCOUNTER — Ambulatory Visit (INDEPENDENT_AMBULATORY_CARE_PROVIDER_SITE_OTHER): Admitting: Family Medicine

## 2024-07-25 ENCOUNTER — Ambulatory Visit: Payer: Self-pay

## 2024-07-25 ENCOUNTER — Encounter: Payer: Self-pay | Admitting: Family Medicine

## 2024-07-25 VITALS — BP 135/85 | HR 91 | Temp 97.2°F | Ht 70.0 in | Wt 186.8 lb

## 2024-07-25 DIAGNOSIS — M62830 Muscle spasm of back: Secondary | ICD-10-CM | POA: Diagnosis not present

## 2024-07-25 DIAGNOSIS — L720 Epidermal cyst: Secondary | ICD-10-CM | POA: Diagnosis not present

## 2024-07-25 MED ORDER — CYCLOBENZAPRINE HCL 5 MG PO TABS: 5.0000 mg | ORAL_TABLET | Freq: Three times a day (TID) | ORAL | 1 refills | Status: AC | PRN

## 2024-07-25 MED ORDER — METHYLPREDNISOLONE ACETATE 40 MG/ML IJ SUSP
40.0000 mg | Freq: Once | INTRAMUSCULAR | Status: AC
  Administered 2024-07-25: 60 mg via INTRAMUSCULAR

## 2024-07-25 NOTE — Telephone Encounter (Signed)
 FYI Only or Action Required?: FYI only for provider.  Patient was last seen in primary care on 07/18/2024 by [name] Foster LABOR, FNP.  Called Nurse Triage reporting Headache.  Symptoms began several weeks ago.  Interventions attempted: Nothing.  Symptoms are: unchanged.  Triage Disposition: See Physician Within 24 Hours  Patient/caregiver understands and will follow disposition?: Yes    Copied from CRM #[id number]. Topic: Clinical - Red Word Triage >> Jul 25, 2024 10:32 AM Wess RAMAN wrote: Red Word that prompted transfer to Nurse Triage: muscles are tightening causing severe headaches mostly on left side of head.  Patient had 2 operations on left shoulder and every now and then his muscles tighten. Reason for Disposition  [1] MODERATE headache (e.g., interferes with normal activities) AND [2] present > 24 hours AND [3] unexplained  (Exceptions: Pain medicines not tried, typical migraine, or headache part of viral illness.)  Answer Assessment - Initial Assessment Questions 1. LOCATION: Where does it hurt?      Left side of head 2. ONSET: When did the headache start? (e.g., minutes, hours, days)      Week or two ago, was seen in office and given an antibiotics for a sinus infection, antibiotics completed. States has a knot on his shoulder that wife found yesterday 3. PATTERN: Does the pain come and go, or has it been constant since it started?     Comes and goes 4. SEVERITY: How bad is the pain? and What does it keep you from doing?  (e.g., Scale 1-10; mild, moderate, or severe)     severe 5. RECURRENT SYMPTOM: Have you ever had headaches before? If Yes, ask: When was the last time? and What happened that time?      Yes, usually once a year 6. CAUSE: What do you think is causing the headache?     Tight neck muscles 7. MIGRAINE: Have you been diagnosed with migraine headaches? If Yes, ask: Is this headache similar?      No, but has cluster headache 8. HEAD  INJURY: Has there been any recent injury to your head?      no 9. OTHER SYMPTOMS: Do you have any other symptoms? (e.g., fever, stiff neck, eye pain, sore throat, cold symptoms)     no 10. PREGNANCY: Is there any chance you are pregnant? When was your last menstrual period?       na  Protocols used: Headache-A-AH

## 2024-07-25 NOTE — Progress Notes (Signed)
 Subjective:  Patient ID: [name] Levonne Raddle., male    DOB: Mar 30, 1956, 68 y.o.   MRN: [id number]  Patient Care Team: Dettinger, Fonda LABOR, MD as PCP - General (Family Medicine)   Chief Complaint:  Headache (Seen [name] Foster on 9/18- states he is no better and thinks its from his left shoulder injury.  States that he is having muscle pain on his left shoulder. )   HPI: [name] Merwin Breden. is a 68 y.o. male presenting on 07/25/2024 for Headache (Seen [name] Foster on 9/18- states he is no better and thinks its from his left shoulder injury.  States that he is having muscle pain on his left shoulder. )   [name] Snavely Jr. is a 68 year old male who presents with neck and shoulder muscle pain.  He experiences intermittent muscle tightening in his neck, with pain radiating from the shoulder up the side of his neck into his head, causing headaches. The pain is described as tender and began around 8 PM last night, persisting until about 6 AM this morning.  He has a history of two rotator cuff surgeries on his shoulder. He attempted to relieve the current muscle pain and headaches with four ibuprofen  and Excedrin Migraine, but these did not alleviate the symptoms. He also tried using heat and applied Ambersol to the affected area, which did not cause additional pain but did not provide significant relief either.  He finished his meloxicam  tablets yesterday, which were previously prescribed when he thought he had a sinus infection causing headaches. He reports noticing a 'knot' on his shoulder, which he acknowledges as a source of discomfort. He mentions his son has a therapeutic massage gun that might be used to help alleviate the muscle tension.  No relief from ibuprofen  and Excedrin Migraine for headache and muscle pain. Reports muscle tightening and tenderness in the neck and shoulder area.          Relevant past medical, surgical, family, and social history reviewed and updated as indicated.  Allergies and  medications reviewed and updated. Data reviewed: Chart in Epic.   Past Medical History:  Diagnosis Date   Abnormal EKG    Apical variant hypertrophic cardiomyopathy (HCC)    Benign essential hypertension    BPH (benign prostatic hyperplasia)    Hypokalemia    Tobacco abuse     Past Surgical History:  Procedure Laterality Date   CARDIAC CATHETERIZATION     2008   COLONOSCOPY N/A 02/26/2015   Procedure: COLONOSCOPY;  Surgeon: Claudis RAYMOND Rivet, MD;  [facility]: [facility];  Service: Endoscopy;  Laterality: N/A;  830   COLONOSCOPY WITH PROPOFOL  N/A 04/10/2020   Procedure: COLONOSCOPY WITH PROPOFOL ;  Surgeon: Rivet Claudis RAYMOND, MD;  Location: [facility];  Service: Endoscopy;  Laterality: N/A;  830   INGUINAL HERNIA REPAIR Right 01/13/2021   Procedure: HERNIA REPAIR INGUINAL ADULT;  Surgeon: Mavis Anes, MD;  Location: [facility];  Service: General;  Laterality: Right;   KIDNEY SURGERY Right April 2015   benign tumor removal   POLYPECTOMY  04/10/2020   Procedure: POLYPECTOMY;  Surgeon: Rivet Claudis RAYMOND, MD;  Location: [facility];  Service: Endoscopy;;  proximal transverse colon(cs); transverse colon polyp (cbiopsy);sigmoid colon polyp(cb);   PROSTATE ABLATION  12-2015 at [facility]   SHOULDER ACROMIOPLASTY Left 01/18/2016   Procedure: SHOULDER ACROMIOPLASTY;  Surgeon: Alm Hummer, MD;  Location: [facility];  Service: Orthopedics;  Laterality: Left;   SHOULDER ARTHROSCOPY WITH ROTATOR CUFF REPAIR AND  SUBACROMIAL DECOMPRESSION Left 01/18/2016   Procedure: LEFT SHOULDER ARTHROSCOPY WITH ROTATOR CUFF REPAIR AND SUBACROMIAL DECOMPRESSION;  Surgeon: Alm Hummer, MD;  Location: [facility];  Service: Orthopedics;  Laterality: Left;  PRE-OP BLOCK WITH GENERAL ANESTHESIA    Social History   Socioeconomic History   Marital status: Divorced    Spouse name: Not on file   Number of children: 1   Years of education: Not on file   Highest education level: Not on file   Occupational History   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 08/03/2022 Tay  Vaping Use   Vaping status: Never Used  Substance and Sexual Activity   Alcohol use: No   Drug use: No   Sexual activity: Not on file  Other Topics Concern   Not on file  Social History Narrative   Divorced   05/31/2021 - has a 64 year old son who lives with his mother, but he visits him every evening.   Social Drivers of Corporate investment banker Strain: Low Risk  (06/18/2024)   Overall Financial Resource Strain (CARDIA)    Difficulty of Paying Living Expenses: Not hard at all  Food Insecurity: No Food Insecurity (06/18/2024)   Hunger Vital Sign    Worried About Running Out of Food in the Last Year: Never true    Ran Out of Food in the Last Year: Never true  Transportation Needs: No Transportation Needs (06/18/2024)   PRAPARE - Administrator, Civil Service (Medical): No    Lack of Transportation (Non-Medical): No  Physical Activity: Inactive (06/18/2024)   Exercise Vital Sign    Days of Exercise per Week: 0 days    Minutes of Exercise per Session: 0 min  Stress: No Stress Concern Present (06/18/2024)   [employer] of Occupational Health - Occupational Stress Questionnaire    Feeling of Stress: Not at all  Social Connections: Moderately Isolated (06/18/2024)   Social Connection and Isolation Panel    Frequency of Communication with Friends and Family: More than three times a week    Frequency of Social Gatherings with Friends and Family: More than three times a week    Attends Religious Services: More than 4 times per year    Active Member of [employer] or Organizations: No    Attends Banker  Meetings: Never    Marital Status: Divorced  Catering manager Violence: Not At Risk (06/18/2024)   Humiliation, Afraid, Rape, and Kick questionnaire    Fear of Current or Ex-Partner: No    Emotionally Abused: No    Physically Abused: No    Sexually Abused: No    Outpatient Encounter Medications as of 07/25/2024  Medication Sig   buPROPion  (WELLBUTRIN  XL) 150 MG 24 hr tablet Take 1 tablet (150 mg total) by mouth daily.   cyclobenzaprine  (FLEXERIL ) 5 MG tablet Take 1 tablet (5 mg total) by mouth 3 (three) times daily as needed for muscle spasms.   famotidine  (PEPCID ) 20 MG tablet Take 1 tablet (20 mg total) by mouth 2 (two) times daily.   fluticasone  (FLONASE ) 50 MCG/ACT nasal spray Place 1 spray into both nostrils  2 (two) times daily as needed for allergies or rhinitis.   gabapentin  (NEURONTIN ) 300 MG capsule Take 1 capsule (300 mg total) by mouth daily.   hydrochlorothiazide  (HYDRODIURIL ) 25 MG tablet Take 1 tablet (25 mg total) by mouth daily.   meloxicam  (MOBIC ) 15 MG tablet Take 1 tablet (15 mg total) by mouth daily.   metoprolol  tartrate (LOPRESSOR ) 25 MG tablet Take 1 tablet (25 mg total) by mouth 2 (two) times daily.   Multiple Vitamins-Minerals (MULTIVITAMIN PO) Take 1 tablet by mouth daily.   nicotine  (NICODERM CQ  - DOSED IN MG/24 HR) 7 mg/24hr patch Place 2 patches (14 mg total) onto the skin daily.   nitroGLYCERIN  (NITROSTAT ) 0.4 MG SL tablet Place 1 tablet (0.4 mg total) under the tongue every 5 (five) minutes as needed for chest pain.   NONFORMULARY OR COMPOUNDED ITEM Take 1 Dose by mouth daily. Memory Vitamin   NONFORMULARY OR COMPOUNDED ITEM Take 1 Dose by mouth daily. Beets   olopatadine  (PATANOL) 0.1 % ophthalmic solution Place 1 drop into both eyes 2 (two) times daily as needed for allergies.   Omega-3 Fatty Acids (FISH OIL) 1000 MG CAPS Take 1,000 mg by mouth daily.   potassium chloride  SA (KLOR-CON  M) 20 MEQ tablet TAKE ONE TABLET BY MOUTH ONCE DAILY   rosuvastatin   (CRESTOR ) 20 MG tablet Take 1 tablet (20 mg total) by mouth daily.   sildenafil  (VIAGRA ) 100 MG tablet TAKE 1/2 TO 1 TABLET BY MOUTH DAILY AS NEEDED FOR ERECTILE DYSFUNCTION   [DISCONTINUED] amoxicillin -clavulanate (AUGMENTIN ) 875-125 MG tablet Take 1 tablet by mouth 2 (two) times daily.   [DISCONTINUED] promethazine -dextromethorphan (PROMETHAZINE -DM) 6.25-15 MG/5ML syrup Take 5 mLs by mouth 4 (four) times daily as needed for cough.   [EXPIRED] methylPREDNISolone  acetate (DEPO-MEDROL ) injection 40 mg    No facility-administered encounter medications on file as of 07/25/2024.    No Known Allergies  Pertinent ROS per HPI, otherwise unremarkable      Objective:  BP 135/85   Pulse 91   Temp (!) 97.2 F (36.2 C)   Ht 5' 10 (1.778 m)   Wt 186 lb 12.8 oz (84.7 kg)   SpO2 96%   BMI 26.80 kg/m    Wt Readings from Last 3 Encounters:  07/25/24 186 lb 12.8 oz (84.7 kg)  07/18/24 186 lb 3.2 oz (84.5 kg)  06/26/24 186 lb 9.6 oz (84.6 kg)    Physical Exam Vitals and nursing note reviewed.  Constitutional:      General: He is not in acute distress.    Appearance: Normal appearance. He is well-developed and normal weight. He is not ill-appearing, toxic-appearing or diaphoretic.  HENT:     Head: Normocephalic and atraumatic.     Right Ear: Tympanic membrane, ear canal and external ear normal.     Left Ear: Tympanic membrane, ear canal and external ear normal.     Mouth/Throat:     Mouth: Mucous membranes are moist.     Pharynx: Oropharynx is clear.  Eyes:     General: No visual field deficit.    Extraocular Movements: Extraocular movements intact.     Pupils: Pupils are equal, round, and reactive to light.  Cardiovascular:     Rate and Rhythm: Normal rate and regular rhythm.     Heart sounds: Normal heart sounds.  Pulmonary:     Effort: Pulmonary effort is normal.     Breath sounds: Normal breath sounds.  Musculoskeletal:     Right shoulder: Normal.  Left shoulder: Decreased  range of motion.     Left upper arm: Normal.     Cervical back: Normal range of motion and neck supple. Spasms and tenderness present. No swelling, edema, deformity, erythema, signs of trauma, lacerations, rigidity, torticollis, bony tenderness or crepitus. No pain with movement. Normal range of motion.     Thoracic back: Normal.       Back:  Skin:    General: Skin is warm and dry.     Capillary Refill: Capillary refill takes less than 2 seconds.      Neurological:     General: No focal deficit present.     Mental Status: He is alert and oriented to person, place, and time.     Cranial Nerves: No cranial nerve deficit, dysarthria or facial asymmetry.  Psychiatric:        Mood and Affect: Mood normal.        Behavior: Behavior normal.        Thought Content: Thought content normal.        Judgment: Judgment normal.      Results for orders placed or performed in visit on 06/26/24  CBC with Differential/Platelet   Collection Time: 06/26/24 10:07 AM  Result Value Ref Range   WBC 6.5 3.4 - 10.8 x10E3/uL   RBC 5.90 (H) 4.14 - 5.80 x10E6/uL   Hemoglobin 15.2 13.0 - 17.7 g/dL   Hematocrit 52.6 62.4 - 51.0 %   MCV 80 79 - 97 fL   MCH 25.8 (L) 26.6 - 33.0 pg   MCHC 32.1 31.5 - 35.7 g/dL   RDW 86.3 88.3 - 84.5 %   Platelets 213 150 - 450 x10E3/uL   Neutrophils 55 Not Estab. %   Lymphs 27 Not Estab. %   Monocytes 13 Not Estab. %   Eos 4 Not Estab. %   Basos 1 Not Estab. %   Neutrophils Absolute 3.6 1.4 - 7.0 x10E3/uL   Lymphocytes Absolute 1.7 0.7 - 3.1 x10E3/uL   Monocytes Absolute 0.8 0.1 - 0.9 x10E3/uL   EOS (ABSOLUTE) 0.2 0.0 - 0.4 x10E3/uL   Basophils Absolute 0.0 0.0 - 0.2 x10E3/uL   Immature Granulocytes 0 Not Estab. %   Immature Grans (Abs) 0.0 0.0 - 0.1 x10E3/uL  CMP14+EGFR   Collection Time: 06/26/24 10:07 AM  Result Value Ref Range   Glucose 106 (H) 70 - 99 mg/dL   BUN 13 8 - 27 mg/dL   Creatinine, Ser 8.93 0.76 - 1.27 mg/dL   eGFR 77 >40 fO/fpw/8.26    BUN/Creatinine Ratio 12 10 - 24   Sodium 136 134 - 144 mmol/L   Potassium 4.0 3.5 - 5.2 mmol/L   Chloride 103 96 - 106 mmol/L   CO2 18 (L) 20 - 29 mmol/L   Calcium  9.3 8.6 - 10.2 mg/dL   Total Protein 7.1 6.0 - 8.5 g/dL   Albumin 4.6 3.9 - 4.9 g/dL   Globulin, Total 2.5 1.5 - 4.5 g/dL   Bilirubin Total 0.6 0.0 - 1.2 mg/dL   Alkaline Phosphatase 120 44 - 121 IU/L   AST 23 0 - 40 IU/L   ALT 32 0 - 44 IU/L  Lipid panel   Collection Time: 06/26/24 10:07 AM  Result Value Ref Range   Cholesterol, Total 89 (L) 100 - 199 mg/dL   Triglycerides 55 0 - 149 mg/dL   HDL 32 (L) >60 mg/dL   VLDL Cholesterol Cal 13 5 - 40 mg/dL   LDL Chol Calc (NIH) 44  0 - 99 mg/dL   Chol/HDL Ratio 2.8 0.0 - 5.0 ratio  Specimen status report   Collection Time: 06/26/24 10:07 AM  Result Value Ref Range   specimen status report Comment   Hgb A1c w/o eAG   Collection Time: 06/26/24 10:07 AM  Result Value Ref Range   Hgb A1c MFr Bld 6.4 (H) 4.8 - 5.6 %  Specimen status report   Collection Time: 06/26/24 10:07 AM  Result Value Ref Range   specimen status report Comment        Pertinent labs & imaging results that were available during my care of the patient were reviewed by me and considered in my medical decision making.  Assessment & Plan:  [name] Foster was seen today for headache.  Diagnoses and all orders for this visit:  Spasm of left trapezius muscle -     cyclobenzaprine  (FLEXERIL ) 5 MG tablet; Take 1 tablet (5 mg total) by mouth 3 (three) times daily as needed for muscle spasms. -     methylPREDNISolone  acetate (DEPO-MEDROL ) injection 40 mg  Epidermoid cyst Discussed treatment options      Neck and shoulder muscle spasm with associated headache Chronic neck and shoulder muscle spasms causing significant discomfort and headaches, unrelieved by ibuprofen  and Excedrin Migraine. Previous shoulder surgeries may contribute to muscle tension. Heat and topical treatments have been ineffective. - Administer  steroid injection for anti-inflammatory effect. - Prescribe Flexeril  (cyclobenzaprine ) up to three times daily as needed for muscle relaxation, with caution about potential drowsiness. - Advise heat therapy to alleviate muscle tension. - Recommend deep tissue massage to address muscle knots. - Encourage increased water  intake to aid in flushing out toxins from muscle knots. - Send prescription for Flexeril  to [facility] on [address].  Epidermoid cyst Epidermoid cyst under the skin. He prefers to address this after resolving the muscle spasm issue. - Consider surgical excision of the cyst at a later date.          Continue all other maintenance medications.  Follow up plan: Return if symptoms worsen or fail to improve.   Continue healthy lifestyle choices, including diet (rich in fruits, vegetables, and lean proteins, and low in salt and simple carbohydrates) and exercise (at least 30 minutes of moderate physical activity daily).  Educational handout given for muscle spasm  The above assessment and management plan was discussed with the patient. The patient verbalized understanding of and has agreed to the management plan. Patient is aware to call the clinic if they develop any new symptoms or if symptoms persist or worsen. Patient is aware when to return to the clinic for a follow-up visit. Patient educated on when it is appropriate to go to the emergency department.   Rosaline Bruns, FNP-C [facility] [phone]

## 2024-08-02 ENCOUNTER — Other Ambulatory Visit: Payer: Self-pay | Admitting: Family Medicine

## 2024-10-10 ENCOUNTER — Telehealth: Payer: Self-pay

## 2024-10-10 DIAGNOSIS — E782 Mixed hyperlipidemia: Secondary | ICD-10-CM

## 2024-10-10 NOTE — Progress Notes (Signed)
 Pharmacy Quality Measure Review  This patient is appearing on a report for being at risk of failing the adherence measure for cholesterol (statin) medications this calendar year.   Medication: Rosuvastatin  20mg  Last fill date: 07/25 for 90 day supply  Spoke with patient, could not afford medications for a while. States that he now thinks he can, and plans to go to the pharmacy today to refill rosuvastatin  and other medications that he takes.   Encouraged patient to go to the pharmacy for this refill and others, and to request that a discount card be applied from the pharmacy staff. Encouraged him to call if he runs into future adherence barriers. All questions by the patient were answered.  Madicyn Mesina, PharmD [facility] Pharmacist

## 2024-10-14 ENCOUNTER — Other Ambulatory Visit: Payer: Self-pay | Admitting: Family Medicine

## 2024-10-14 DIAGNOSIS — I10 Essential (primary) hypertension: Secondary | ICD-10-CM

## 2024-10-22 ENCOUNTER — Other Ambulatory Visit: Payer: Self-pay | Admitting: Family Medicine

## 2024-11-14 ENCOUNTER — Other Ambulatory Visit: Payer: Self-pay | Admitting: *Deleted

## 2024-11-14 DIAGNOSIS — Z87891 Personal history of nicotine dependence: Secondary | ICD-10-CM

## 2024-11-14 DIAGNOSIS — F17210 Nicotine dependence, cigarettes, uncomplicated: Secondary | ICD-10-CM

## 2024-11-14 DIAGNOSIS — Z122 Encounter for screening for malignant neoplasm of respiratory organs: Secondary | ICD-10-CM

## 2024-12-30 ENCOUNTER — Ambulatory Visit: Payer: Self-pay | Admitting: Family Medicine

## 2025-06-19 ENCOUNTER — Ambulatory Visit: Payer: Self-pay
